# Patient Record
Sex: Female | Born: 1940
Health system: Southern US, Community
[De-identification: ages and names within clinical notes are randomized; demographics above are authoritative.]

## PROBLEM LIST (undated history)

## (undated) DIAGNOSIS — E119 Type 2 diabetes mellitus without complications: Secondary | ICD-10-CM

## (undated) DIAGNOSIS — I10 Essential (primary) hypertension: Secondary | ICD-10-CM

## (undated) DIAGNOSIS — Q250 Patent ductus arteriosus: Secondary | ICD-10-CM

## (undated) DIAGNOSIS — I318 Other specified diseases of pericardium: Secondary | ICD-10-CM

## (undated) DIAGNOSIS — M1990 Unspecified osteoarthritis, unspecified site: Secondary | ICD-10-CM

## (undated) DIAGNOSIS — M549 Dorsalgia, unspecified: Secondary | ICD-10-CM

## (undated) DIAGNOSIS — J449 Chronic obstructive pulmonary disease, unspecified: Secondary | ICD-10-CM

## (undated) DIAGNOSIS — Z8489 Family history of other specified conditions: Secondary | ICD-10-CM

## (undated) DIAGNOSIS — Z794 Long term (current) use of insulin: Secondary | ICD-10-CM

## (undated) DIAGNOSIS — I2510 Atherosclerotic heart disease of native coronary artery without angina pectoris: Secondary | ICD-10-CM

## (undated) DIAGNOSIS — E079 Disorder of thyroid, unspecified: Secondary | ICD-10-CM

## (undated) HISTORY — DX: Patent ductus arteriosus: Q25.0

## (undated) HISTORY — DX: Type 2 diabetes mellitus without complications: E11.9

## (undated) HISTORY — PX: INCISIONAL HERNIA REPAIR: SHX193

## (undated) HISTORY — DX: Dorsalgia, unspecified: M54.9

## (undated) HISTORY — PX: ABDOMINAL HYSTERECTOMY: SHX81

## (undated) HISTORY — PX: TUBAL LIGATION: SHX77

## (undated) HISTORY — DX: Long term (current) use of insulin: Z79.4

## (undated) HISTORY — DX: Unspecified osteoarthritis, unspecified site: M19.90

## (undated) HISTORY — DX: Other specified diseases of pericardium: I31.8

## (undated) HISTORY — DX: Atherosclerotic heart disease of native coronary artery without angina pectoris: I25.10

## (undated) HISTORY — DX: Disorder of thyroid, unspecified: E07.9

## (undated) HISTORY — DX: Essential (primary) hypertension: I10

---

## 2000-05-12 ENCOUNTER — Other Ambulatory Visit: Admission: RE | Admit: 2000-05-12 | Discharge: 2000-05-12 | Payer: Self-pay | Admitting: *Deleted

## 2002-02-08 ENCOUNTER — Other Ambulatory Visit: Admission: RE | Admit: 2002-02-08 | Discharge: 2002-02-08 | Payer: Self-pay | Admitting: Family Medicine

## 2003-08-15 ENCOUNTER — Ambulatory Visit (HOSPITAL_COMMUNITY): Admission: RE | Admit: 2003-08-15 | Discharge: 2003-08-15 | Payer: Self-pay | Admitting: Neurology

## 2003-12-12 ENCOUNTER — Other Ambulatory Visit: Admission: RE | Admit: 2003-12-12 | Discharge: 2003-12-12 | Payer: Self-pay | Admitting: Family Medicine

## 2004-07-22 ENCOUNTER — Ambulatory Visit (HOSPITAL_COMMUNITY): Admission: RE | Admit: 2004-07-22 | Discharge: 2004-07-22 | Payer: Self-pay | Admitting: Ophthalmology

## 2007-08-02 ENCOUNTER — Ambulatory Visit: Payer: Self-pay | Admitting: Internal Medicine

## 2007-08-26 ENCOUNTER — Ambulatory Visit: Payer: Self-pay | Admitting: Internal Medicine

## 2007-09-08 ENCOUNTER — Ambulatory Visit: Payer: Self-pay | Admitting: Cardiology

## 2007-09-10 ENCOUNTER — Ambulatory Visit: Payer: Self-pay | Admitting: Internal Medicine

## 2007-09-10 ENCOUNTER — Ambulatory Visit: Payer: Self-pay | Admitting: Cardiology

## 2007-09-10 ENCOUNTER — Inpatient Hospital Stay (HOSPITAL_COMMUNITY): Admission: AD | Admit: 2007-09-10 | Discharge: 2007-09-21 | Payer: Self-pay | Admitting: Cardiology

## 2007-09-11 ENCOUNTER — Ambulatory Visit: Payer: Self-pay | Admitting: Infectious Diseases

## 2007-09-17 ENCOUNTER — Encounter (INDEPENDENT_AMBULATORY_CARE_PROVIDER_SITE_OTHER): Payer: Self-pay | Admitting: Interventional Radiology

## 2007-09-28 ENCOUNTER — Emergency Department (HOSPITAL_COMMUNITY): Admission: EM | Admit: 2007-09-28 | Discharge: 2007-09-28 | Payer: Self-pay | Admitting: Emergency Medicine

## 2007-10-14 ENCOUNTER — Ambulatory Visit: Payer: Self-pay | Admitting: Cardiology

## 2007-10-20 ENCOUNTER — Ambulatory Visit: Payer: Self-pay | Admitting: Cardiology

## 2007-12-21 ENCOUNTER — Encounter: Payer: Self-pay | Admitting: Cardiology

## 2007-12-21 ENCOUNTER — Ambulatory Visit: Payer: Self-pay | Admitting: Cardiology

## 2008-02-24 ENCOUNTER — Ambulatory Visit: Payer: Self-pay | Admitting: Cardiology

## 2008-03-08 ENCOUNTER — Encounter: Payer: Self-pay | Admitting: Cardiology

## 2008-03-20 ENCOUNTER — Encounter: Payer: Self-pay | Admitting: Cardiology

## 2008-10-09 ENCOUNTER — Ambulatory Visit: Payer: Self-pay | Admitting: Cardiology

## 2008-10-24 ENCOUNTER — Encounter: Payer: Self-pay | Admitting: Cardiology

## 2009-03-08 ENCOUNTER — Encounter: Payer: Self-pay | Admitting: Cardiology

## 2009-09-25 ENCOUNTER — Encounter: Payer: Self-pay | Admitting: Cardiology

## 2009-10-10 ENCOUNTER — Encounter: Payer: Self-pay | Admitting: Internal Medicine

## 2009-10-25 ENCOUNTER — Ambulatory Visit: Payer: Self-pay | Admitting: Cardiology

## 2009-10-25 DIAGNOSIS — E119 Type 2 diabetes mellitus without complications: Secondary | ICD-10-CM

## 2009-10-25 DIAGNOSIS — I319 Disease of pericardium, unspecified: Secondary | ICD-10-CM | POA: Insufficient documentation

## 2009-10-25 DIAGNOSIS — R0602 Shortness of breath: Secondary | ICD-10-CM | POA: Insufficient documentation

## 2009-12-27 ENCOUNTER — Encounter: Payer: Self-pay | Admitting: Internal Medicine

## 2010-01-01 ENCOUNTER — Encounter (INDEPENDENT_AMBULATORY_CARE_PROVIDER_SITE_OTHER): Payer: Self-pay | Admitting: *Deleted

## 2010-01-01 ENCOUNTER — Ambulatory Visit: Payer: Self-pay | Admitting: Internal Medicine

## 2010-01-01 DIAGNOSIS — R198 Other specified symptoms and signs involving the digestive system and abdomen: Secondary | ICD-10-CM

## 2010-01-01 DIAGNOSIS — K625 Hemorrhage of anus and rectum: Secondary | ICD-10-CM

## 2010-01-01 DIAGNOSIS — R1319 Other dysphagia: Secondary | ICD-10-CM | POA: Insufficient documentation

## 2010-01-08 ENCOUNTER — Ambulatory Visit (HOSPITAL_COMMUNITY)
Admission: RE | Admit: 2010-01-08 | Discharge: 2010-01-08 | Payer: Self-pay | Source: Home / Self Care | Admitting: Internal Medicine

## 2010-01-08 ENCOUNTER — Ambulatory Visit: Payer: Self-pay | Admitting: Internal Medicine

## 2010-03-18 ENCOUNTER — Encounter: Payer: Self-pay | Admitting: Physician Assistant

## 2010-03-18 ENCOUNTER — Ambulatory Visit: Payer: Self-pay | Admitting: Physician Assistant

## 2010-03-18 ENCOUNTER — Encounter: Payer: Self-pay | Admitting: Cardiology

## 2010-03-18 DIAGNOSIS — R079 Chest pain, unspecified: Secondary | ICD-10-CM

## 2010-03-18 DIAGNOSIS — R072 Precordial pain: Secondary | ICD-10-CM | POA: Insufficient documentation

## 2010-03-18 DIAGNOSIS — I10 Essential (primary) hypertension: Secondary | ICD-10-CM

## 2010-03-18 DIAGNOSIS — E782 Mixed hyperlipidemia: Secondary | ICD-10-CM

## 2010-03-19 ENCOUNTER — Encounter: Payer: Self-pay | Admitting: Cardiology

## 2010-03-19 ENCOUNTER — Ambulatory Visit: Payer: Self-pay | Admitting: Cardiology

## 2010-03-26 ENCOUNTER — Ambulatory Visit: Payer: Self-pay | Admitting: Cardiology

## 2010-03-26 DIAGNOSIS — Q250 Patent ductus arteriosus: Secondary | ICD-10-CM

## 2010-03-26 DIAGNOSIS — R9389 Abnormal findings on diagnostic imaging of other specified body structures: Secondary | ICD-10-CM

## 2010-04-17 ENCOUNTER — Encounter (INDEPENDENT_AMBULATORY_CARE_PROVIDER_SITE_OTHER): Payer: Self-pay | Admitting: *Deleted

## 2010-04-23 ENCOUNTER — Ambulatory Visit (HOSPITAL_COMMUNITY)
Admission: RE | Admit: 2010-04-23 | Discharge: 2010-04-23 | Payer: Self-pay | Source: Home / Self Care | Attending: Cardiology | Admitting: Cardiology

## 2010-05-14 ENCOUNTER — Ambulatory Visit
Admission: RE | Admit: 2010-05-14 | Discharge: 2010-05-14 | Payer: Self-pay | Source: Home / Self Care | Attending: Cardiology | Admitting: Cardiology

## 2010-05-14 ENCOUNTER — Encounter (INDEPENDENT_AMBULATORY_CARE_PROVIDER_SITE_OTHER): Payer: Self-pay | Admitting: *Deleted

## 2010-05-14 ENCOUNTER — Encounter: Payer: Self-pay | Admitting: Cardiology

## 2010-05-14 DIAGNOSIS — I2510 Atherosclerotic heart disease of native coronary artery without angina pectoris: Secondary | ICD-10-CM | POA: Insufficient documentation

## 2010-05-16 ENCOUNTER — Encounter: Payer: Self-pay | Admitting: Cardiology

## 2010-05-21 ENCOUNTER — Ambulatory Visit
Admission: RE | Admit: 2010-05-21 | Discharge: 2010-05-21 | Payer: Self-pay | Source: Home / Self Care | Attending: Cardiovascular Disease | Admitting: Cardiovascular Disease

## 2010-05-21 ENCOUNTER — Encounter: Payer: Self-pay | Admitting: Cardiovascular Disease

## 2010-05-21 ENCOUNTER — Encounter: Payer: Self-pay | Admitting: Cardiology

## 2010-05-22 LAB — POCT I-STAT 3, ART BLOOD GAS (G3+)
Acid-Base Excess: 2 mmol/L (ref 0.0–2.0)
Acid-Base Excess: 2 mmol/L (ref 0.0–2.0)
Bicarbonate: 27.5 mEq/L — ABNORMAL HIGH (ref 20.0–24.0)
Bicarbonate: 27.8 mEq/L — ABNORMAL HIGH (ref 20.0–24.0)
O2 Saturation: 86 %
O2 Saturation: 90 %
TCO2: 29 mmol/L (ref 0–100)
TCO2: 29 mmol/L (ref 0–100)
pCO2 arterial: 45.8 mmHg — ABNORMAL HIGH (ref 35.0–45.0)
pCO2 arterial: 45.8 mmHg — ABNORMAL HIGH (ref 35.0–45.0)
pH, Arterial: 7.387 (ref 7.350–7.400)
pH, Arterial: 7.391 (ref 7.350–7.400)
pO2, Arterial: 52 mmHg — ABNORMAL LOW (ref 80.0–100.0)
pO2, Arterial: 60 mmHg — ABNORMAL LOW (ref 80.0–100.0)

## 2010-05-22 LAB — POCT I-STAT 3, VENOUS BLOOD GAS (G3P V)
Acid-Base Excess: 3 mmol/L — ABNORMAL HIGH (ref 0.0–2.0)
Bicarbonate: 28.9 mEq/L — ABNORMAL HIGH (ref 20.0–24.0)
O2 Saturation: 69 %
TCO2: 30 mmol/L (ref 0–100)
pCO2, Ven: 47.1 mmHg (ref 45.0–50.0)
pH, Ven: 7.396 — ABNORMAL HIGH (ref 7.250–7.300)
pO2, Ven: 37 mmHg (ref 30.0–45.0)

## 2010-05-22 LAB — GLUCOSE, POCT (MANUAL RESULT ENTRY)
Glucose, Bld: 206 mg/dL — ABNORMAL HIGH (ref 70–99)
Operator id: 194801

## 2010-05-23 ENCOUNTER — Telehealth (INDEPENDENT_AMBULATORY_CARE_PROVIDER_SITE_OTHER): Payer: Self-pay | Admitting: *Deleted

## 2010-05-29 ENCOUNTER — Ambulatory Visit
Admission: RE | Admit: 2010-05-29 | Discharge: 2010-05-29 | Payer: Self-pay | Source: Home / Self Care | Attending: Cardiology | Admitting: Cardiology

## 2010-06-04 NOTE — Assessment & Plan Note (Signed)
Summary: Ellen Woods   Visit Type:  Follow-up Primary Provider:  Paulita Cradle, NP  CC:  rapid heart rate .  History of Present Illness: Primary Cardiologist:  Dr. Lewayne Bunting   Ellen Woods is a 70 year old patient who is followed in our [place] office.  She was seen at [facility] today with complaints of chest discomfort and added onto my schedule here in [place].  Her history includes a moderate pericardial effusion in 2009.  She ruled out for constrictive physiology.  She had a pleural effusion that was tapped.  She was treated for pneumonia.  She was ruled out for tuberculosis.  In followup, her effusion resolved.  She last saw Dr. Andee Lineman in June 2011.  In August of this year, she underwent upper endoscopy with esophageal dilatation secondary to dysphagia.  She describes dyspnea with exertion over the last 2-3 months.  She notes this with just minimal activity.  For example, she developed significant shortness of breath with trying to rake leaves this past weekend. She really describes New York Heart Association class IIb-III symptoms.  She denies orthopnea or PND.  She denies edema.  Two  nights ago, she developed substernal chest pressure or squeezing.  This was at rest.  It lasted several hours.  She thinks she felt it up in her neck.  She denies shortness of breath associated with this.  Yesterday, she was in an argument with her daughter and developed tachycardia palpitations.  Her symptoms lasted 3-4 hours.  She denies any associated chest pain with this.  She denies exertional chest pain.  Her electrocardiogram today at her primary care provider's office demonstrated a heart rate of 67, normal sinus rhythm, normal axis and T wave inversions in 2, 3 and aVF.  Current Medications (verified): 1)  Miralax  Powd (Polyethylene Glycol 3350) .... Use As Directed As Needed 2)  Potassium Chloride Crys Cr 20 Meq Cr-Tabs (Potassium Chloride Crys Cr) .... Take One Tablet By  Mouth Daily 3)  Pre-Natal Formula  Tabs (Prenatal Multivit-Min-Fe-Fa) .... Take 1 Tablet By Mouth Once A Day 4)  Furosemide 20 Mg Tabs (Furosemide) .... Take 1 Tablet By Mouth Once A Day 5)  Metoprolol Tartrate 25 Mg Tabs (Metoprolol Tartrate) .... Take 1 Tablet By Mouth Two Times A Day 6)  Lantus 100 Unit/ml Soln (Insulin Glargine) .... Use As Directed 7)  Caltrate 600+d 600-400 Mg-Unit Tabs (Calcium Carbonate-Vitamin D) .... Take 1 Tablet By Mouth Once A Day 8)  Alprazolam 0.5 Mg Tabs (Alprazolam) .... Take Three Times Daily 9)  Omeprazole 40 Mg Cpdr (Omeprazole) .... Take 1 Tablet By Mouth Once A Day 10)  Novolog Flexpen 100 Unit/ml Soln (Insulin Aspart) .... Use As Directed 11)  Levothyroxine Sodium 50 Mcg Tabs (Levothyroxine Sodium) .... Take 1 Tablet By Mouth Once A Day 12)  Paroxetine Hcl 20 Mg Tabs (Paroxetine Hcl) .... As Needed 13)  Proctozone-Hc 2.5 % Crea (Hydrocortisone) .... Apply To Anal and Rectal Area Two Times A Day and As Needed 14)  Lisinopril 5 Mg Tabs (Lisinopril) .... Take One Daily  Allergies: 1)  ! * Azithromycin 2)  Codeine  Past History:  Past Medical History: History of exertional dyspnea with moderate pericardial effusion with positive clue Kussmaul but ruled out for constrictive pericarditis by cardiac catheterization; possible pneumonia ruled out for tuberculosis at [facility] (2009) CAD   a.  Cath 09/2007:  60% prox. LAD; 50% mid LAD; 40% prox. CFX h/o Right carotid bruit granulomatous calcification  of the liver and spleen significant calcification of the pericardium. prolonged elevated sedimentation rate now normalized Anemia Diabetes mellitus Hypertension Hyperlipidemia Hypothyroidism Depression GERD   a.  s/p EGD with esophageal dilation in 12/2009  Past Surgical History: Reviewed history from 12/31/2009 and no changes required. incisional hernia repair hysterectomy Tubal Ligation  Family History: Father died from complications of  a motor vehicle accident and her mother died from a stroke. Sis:  CAD; s/p CABG in 63s with subsequent MI  Social History: Reviewed history from 02/27/2008 and no changes required. the patient is widowed and has two grown daughters.  She remains active physically.  She does not use alcohol, tobacco or street drugs.  There is no prior history of TB exposure  Review of Systems       The patient denies fevers, chills, cough, melena, hematochezia, dysuria, hematuria, rashes, arthralgias, myalgias, heat or cold intolerance.  Please see the HPI.  All other systems reviewed and negative.   Vital Signs:  Patient profile:   70 year old female Height:      59 inches Weight:      122 pounds BMI:     24.73 Pulse rate:   68 / minute Pulse rhythm:   regular BP sitting:   98 / 62  (right arm)  Vitals Entered By: Jacquelin Hawking, CMA (March 18, 2010 3:15 PM)  Physical Exam  General:  Well nourished, well developed in no acute distress HEENT: Normal Neck: No JVD Cardiac:  Normal S1, S2; RRR; no murmur Lungs:  Clear to auscultation bilaterally, no wheezing, rhonchi or rales Abd: Soft, nontender, no hepatomegaly Ext: No edema Vascular: No carotid  bruits Skin: Warm and dry MSK:  No deformities Lymph: No adenopathy Endocrine:  No thyromegaly Neuro: CNs 2-12 intact; nonfocal    EKG  Procedure date:  03/18/2010  Findings:      NSR HR 63 normal axis TW inversions 2, 3, aVF LVH   Impression & Recommendations:  Problem # 1:  CHEST PAIN UNSPECIFIED (ICD-786.50)  She has more typical features than atypical. She did have a recent EGD with dilation done a couple mos. ago. She has TW inversions inferiorly, but has had these on prior ECGs as well. Discussed with Dr. Ladona Ridgel. I will set her up with a stress myoview in [place] to r/o ischemia as she has a h/o moderate nonobstructive CAD on cath as well as significant risk factors of HTN, HLP, DM2. She will be brought back in close follow up  in [place] with her primary cardiologist, Dr. Andee Lineman, in one week.  Problem # 2:  DYSPNEA (ICD-786.05)  As above, she will have a myoview done. I will also get a 2D echo done tomorrow in [place] to r/o recurrent pericardial effusion with her past medical hx.  Problem # 3:  ESSENTIAL HYPERTENSION, BENIGN (ICD-401.1)  Controlled.  Problem # 4:  HYPERLIPIDEMIA (ICD-272.4)  Followed by PCP.  Problem # 5:  DM (ICD-250.00)  Problem # 6:  DYSPHAGIA (ICD-787.29)  s/p recent EGD with dilatation.   Patient Instructions: 1)  Your physician recommends that you schedule a follow-up appointment in: Dr. Andee Lineman. 2)  Your physician has requested that you have an echocardiogram.  Echocardiography is a painless test that uses sound waves to create images of your heart. It provides your doctor with information about the size and shape of your heart and how well your heart's chambers and valves are working.  This procedure takes approximately one hour. There are no restrictions  for this procedure. Test to be done in [place] 3)  Your physician has requested that you have an exercise stress myoview.  For further information please visit [email or web address]  Please follow instruction sheet, as given. to be done in [place] 4)  Your physician recommends that you continue on your current medications as directed. Please refer to the Current Medication list given to you today. Make sure to take an Aspirin daily as prescrived. New medication. Nitroglycerin 0.4 mg. Take one tablet SL for chest pain. May repeat every 5 minutes for a total of 3 doses. Prescriptions: NITROSTAT 0.4 MG SUBL (NITROGLYCERIN) 1 tablet under tongue at onset of chest pain; you may repeat every 5 minutes for up to 3 doses.  #25 x 3   Entered by:   Ollen Gross, RN, BSN   Authorized by:   Tereso Newcomer PA-C   Signed by:   Ollen Gross, RN, BSN on 03/18/2010   Method used:   Electronically to        [facility] (retail)       [address]        [place], [place]  [address]       Ph: [phone]       Fax: [phone]   RxID:   [id number]  I have personally reviewed the prescriptions today for accuracy. Tereso Newcomer PA-C  March 18, 2010 4:42 PM

## 2010-06-04 NOTE — Assessment & Plan Note (Signed)
Summary: GERD/YF   History of Present Illness Visit Type: Follow-up Visit Primary GI MD: Stan Head MD [facility] Primary Provider: Elon Alas Requesting Provider: Rudi Heap, MD Chief Complaint: Dysphagia x 3months History of Present Illness:   70 yo ww with 3 months + of dysphagia. Probably at least 6 months. Has not had this problem previously. She used to have heartburn years ago but not for a number of years. She drinks water to make the food pass. Solid not liquids. Recent LLQ pain that resolved. She is not really using her ompeprazole for the past 2 months or so because she was not having heartburn.  She has seen bright red blood streaked on mucus x 1 week. May have had empiric antibiotics in past few months ? diverticulitis. Has been using fish oil fo past month as well as metamucil to help defecation as has generally been squirrelly.    GI Review of Systems    Reports abdominal pain, dysphagia with solids, and  vomiting.      Denies acid reflux, belching, bloating, chest pain, dysphagia with liquids, heartburn, loss of appetite, nausea, vomiting blood, weight loss, and  weight gain.      Reports diarrhea and  rectal bleeding.     Denies anal fissure, black tarry stools, change in bowel habit, constipation, diverticulosis, fecal incontinence, heme positive stool, hemorrhoids, irritable bowel syndrome, jaundice, light color stool, liver problems, and  rectal pain.    Clinical Reports Reviewed:  Colonoscopy:  08/26/2007:  Results: Hemorrhoids. Location:  [facility].   Comments: 1) SMALL EXTERNAL HEMORRHOIDS 2) OTHERWISE NORMAL EXAM WITH GOOD PREP   Current Medications (verified): 1)  Miralax  Powd (Polyethylene Glycol 3350) .... Use As Directed As Needed 2)  Potassium Chloride Crys Cr 20 Meq Cr-Tabs (Potassium Chloride Crys Cr) .... Take One Tablet By Mouth Daily 3)  Aspirin 81 Mg Tbec (Aspirin) .... Take 1 Tablet By Mouth Once A Day 4)  Pre-Natal  Formula  Tabs (Prenatal Multivit-Min-Fe-Fa) .... Take 1 Tablet By Mouth Once A Day 5)  Furosemide 20 Mg Tabs (Furosemide) .... Take 1 Tablet By Mouth Once A Day 6)  Metoprolol Tartrate 25 Mg Tabs (Metoprolol Tartrate) .... Take 1 Tablet By Mouth Two Times A Day 7)  Lantus 100 Unit/ml Soln (Insulin Glargine) .... Use As Directed 8)  Caltrate 600+d 600-400 Mg-Unit Tabs (Calcium Carbonate-Vitamin D) .... Take 1 Tablet By Mouth Once A Day 9)  Simvastatin 40 Mg Tabs (Simvastatin) .... Take 1 Tablet By Mouth Once A Day 10)  Alprazolam 0.5 Mg Tabs (Alprazolam) .... Take 1 Tablet By Mouth Once A Day 11)  Omeprazole 40 Mg Cpdr (Omeprazole) .... Take 1 Tablet By Mouth Once A Day 12)  Novolog Flexpen 100 Unit/ml Soln (Insulin Aspart) .... Use As Directed 13)  Levothyroxine Sodium 50 Mcg Tabs (Levothyroxine Sodium) .... Take 1 Tablet By Mouth Once A Day 14)  Fish Oil 1200 Mg Caps (Omega-3 Fatty Acids) .... Take 1 Tablet By Mouth Once A Day 15)  Paroxetine Hcl 20 Mg Tabs (Paroxetine Hcl) .... As Needed  Allergies (verified): 1)  ! * Azithromycin 2)  Codeine  Past History:  Past Medical History: Reviewed history from 12/31/2009 and no changes required. history of exertional dyspnea with moderate pericardial effusion.   status post positive clue Kussmaul but ruled out for constrictive pericarditis by cardiac catheterization status-post fevers possible pneumonia ruled out for tuberculosis at [facility] right carotid bruit, no current workup yet  granulomatous calcification of  the liver and spleen significant calcification of the pericardium. prolonged elevated sedimentation rate now normalized Anemia Diabetes mellitus  Past Surgical History: Reviewed history from 12/31/2009 and no changes required. incisional hernia repair hysterectomy Tubal Ligation  Family History: Reviewed history from 02/27/2008 and no changes required. negative for premature coronary artery disease and cancer.   Father died from complications of a motor vehicle accident and her mother died from a stroke.  Social History: Reviewed history from 02/27/2008 and no changes required. the patient is widowed and has two grown daughters.  She remains active physically.  She does not use alcohol, tobacco or street drugs.  There is no prior history of TB exposure  Review of Systems       has gained a few #. blood sugar 65 last week with nausea and vomiting and inconinence of stool  All other ROS negative except as per HPI.   Vital Signs:  Patient profile:   70 year old female Height:      59 inches Weight:      122.50 pounds BMI:     24.83 Pulse rate:   68 / minute Pulse rhythm:   regular BP sitting:   142 / 70  (left arm) Cuff size:   regular  Vitals Entered By: June McMurray CMA Duncan Dull) (January 01, 2010 1:23 PM)  Physical Exam  General:  Well developed, well nourished, no acute distress. Eyes:  PERRLA, no icterus. Mouth:  No deformity or lesions, +dentures Neck:  Supple; no masses or thyromegaly. Lungs:  Clear throughout to auscultation. Heart:  Regular rate and rhythm; no murmurs, rubs,  or bruits. Abdomen:  Soft, nontender and nondistended. No masses, hepatosplenomegaly or hernias noted. Normal bowel sounds. Rectal:  Female staff present: external hemorrhoids, brown stool, no mass, mildly tenser with normal tone and slight anal stenosis Extremities:  No clubbing, cyanosis, edema or deformities noted. Cervical Nodes:  No significant cervical or supraclavicular adenopathy.  Psych:  Alert and cooperative. Normal mood and affect.   Impression & Recommendations:  Problem # 1:  DYSPHAGIA (ZOX-096.04) Assessment New  Instermittent and recurrent. seems like a ring or GERD stricture. Cancer is possible. EGD ad dilation. Restart omeprazole 40 mg once daily. Risks, benefits,and indications of endoscopic procedure(s) were reviewed with the patient and all questions answered.  Orders: ZEGD  Balloon Dil (ZEGD Balloon)  Problem # 2:  RECTAL BLEEDING (ICD-569.3) Seems hemorrhoidsl. try topical tx.  Problem # 3:  CHANGE IN BOWELS (VWU-981.19) Assessment: New I suspect fish oil may be culprit. No frank diarrhea. will see what happens when she holds fish oil.  Patient Instructions: 1)  Please pick up your medications at your pharmacy.  2)  We will see you at your procedure on 01/08/10. 3)  Hold your Lantus the morning of your procedure.  See seperate instructions. 4)  Restart your Omeprazole. 5)  Stop your Fish Oil. 6)  Upper Endoscopy with Dilatation brochure given. 7)  Copy Sent To: Belva Agee, NP 8)  The medication list was reviewed and reconciled.  All changed / newly prescribed medications were explained.  A complete medication list was provided to the patient / caregiver.  Prescriptions: PROCTOZONE-HC 2.5 % CREA (HYDROCORTISONE) apply to anal and rectal area two times a day and as needed  #30 grams x 0   Entered and Authorized by:   Iva Boop MD, [facility]   Signed by:   Iva Boop MD, [facility] on 01/01/2010   Method used:   Electronically to  [facility] [address] [facility] (retail)       [address]       [place], [place]  [address]       Ph: [phone]       Fax: [phone]   RxID:   [id number]   Appended Document: GERD/YF CBC ok on 6/8 11 except wbc 3.6

## 2010-06-04 NOTE — Letter (Signed)
Summary: [facility]  [facility]   Imported By: Lennie Odor 01/09/2010 16:06:21  _____________________________________________________________________  External Attachment:    Type:   Image     Comment:   External Document

## 2010-06-04 NOTE — Procedures (Signed)
Summary: Colonoscopy: Hemorrhoids   Colonoscopy  Procedure date:  08/26/2007  Findings:      Results: Hemorrhoids. Location:  [facility].   Comments: 1) SMALL EXTERNAL HEMORRHOIDS 2) OTHERWISE NORMAL EXAM WITH GOOD PREP  Comments:      Repeat colonoscopy in 10 years.    Procedures Next Due Date:    Colonoscopy: 09/2017  Patient Name: Ellen Woods, Ellen Woods. MRN: [id number] Procedure Procedures: Colorectal cancer screening, average risk CPT: G0121.  Personnel: Endoscopist: Iva Boop, MD, [facility].  Referred By: Paulita Cradle, NP.  Exam Location: Exam performed in Outpatient Clinic. Outpatient  Patient Consent: Procedure, Alternatives, Risks and Benefits discussed, consent obtained, from patient. Consent was obtained by the RN.  Indications  Average Risk Screening Routine.  History  Current Medications: Patient is not currently taking Coumadin.  Pre-Exam Physical: Performed Aug 26, 2007. Cardio-pulmonary exam, Rectal exam, HEENT exam , Abdominal exam, Mental status exam WNL.  Comments: Pt. history reviewed/updated, physical exam performed prior to initiation of sedation? YES Exam Exam: Extent of exam reached: Cecum, extent intended: Cecum.  The cecum was identified by appendiceal orifice and IC valve. Patient position: on left side. Time to Cecum: 00:04. Time for Withdrawl: 00:10:18. Colon retroflexion performed. Images taken. ASA Classification: II. Tolerance: excellent.  Monitoring: Pulse and BP monitoring, Oximetry used. Supplemental O2 given.  Colon Prep Used MiraLax for colon prep. Prep results: good.  Sedation Meds: Patient assessed and found to be appropriate for moderate (conscious) sedation. Fentanyl 75 mcg. given IV. Versed 7 mg. given IV.  Findings - NORMAL EXAM: Cecum to Sigmoid Colon.  HEMORRHOIDS: External. Size: Grade I.   Assessment  Comments: 1) SMALL EXTERNAL HEMORRHOIDS 2) OTHERWISE NORMAL EXAM WITH GOOD  PREP Events  Unplanned Interventions: No intervention was required.  Plans Disposition: After procedure patient sent to recovery. After recovery patient sent home.  Scheduling/Referral: Colonoscopy, 10 YRS FOR ROUTINE SCREEN,   CC:   Paulita Cradle, MD  This report was created from the original endoscopy report, which was reviewed and signed by the above listed endoscopist.

## 2010-06-04 NOTE — Assessment & Plan Note (Signed)
Summary: 1 wk fu per Tereso Newcomer   Visit Type:  Follow-up Primary Ellen Woods:  Ellen Woods   History of Present Illness: the patient was seen recently by Tereso Newcomer because of increased dyspnea. She was set up for an echocardiogram was found to have normal function. There was evidence of possible patent ductus arteriosus or other bone or shunt without clear origin. Of note is that the patient had a prior cardiac catheterization with nonobstructive coronary disease and actually had a left and right heart catheterization to rule out constrictive pericarditis. On chest x-ray she has known calcification of her pericardium. She status post prior thoracentesis with no recurrence. The patient was scheduled for a Myoview by Tereso Newcomer but the patient canceled this and was wondering if she needed this.  She continues to be short of breath. This occurs on minimal activity. She stated she was raking leaves this weekend and could only do it for over 5 minutes at a time. She has felt her exercise tolerance is markedly decreased. She reports however no chest pain or chest tightness. Sometimes she does experience a pounding sensation of note is that the patient was given reportedly recent recommendation to start up her oxygen which he has not done yet. She reports increased palpitations. She does appear anxious.  Interestingly as documented in my prior notes and again during this physical examination there is no evidence of a continuous murmur or any evidence of physical examination of a patent ductus arteriosus.  Preventive Screening-Counseling & Management  Alcohol-Tobacco     Smoking Status: never  Current Medications (verified): 1)  Miralax  Powd (Polyethylene Glycol 3350) .... Use As Directed As Needed 2)  Potassium Chloride Crys Cr 20 Meq Cr-Tabs (Potassium Chloride Crys Cr) .... Take One Tablet By Mouth Daily 3)  Pre-Natal Formula  Tabs (Prenatal Multivit-Min-Fe-Fa) .... Take 1 Tablet By Mouth  Once A Day 4)  Furosemide 20 Mg Tabs (Furosemide) .... Take 1 Tablet By Mouth Once A Day 5)  Metoprolol Tartrate 25 Mg Tabs (Metoprolol Tartrate) .... Take 1 11/2 Tabs (37.5mg ) Two Times A Day 6)  Lantus 100 Unit/ml Soln (Insulin Glargine) .... Use As Directed 7)  Caltrate 600+d 600-400 Mg-Unit Tabs (Calcium Carbonate-Vitamin D) .... Take 1 Tablet By Mouth Once A Day 8)  Alprazolam 0.5 Mg Tabs (Alprazolam) .... Take 1 Tablet By Mouth Two Times A Day 9)  Omeprazole 40 Mg Cpdr (Omeprazole) .... Take 1 Tablet By Mouth Once A Day 10)  Novolog Flexpen 100 Unit/ml Soln (Insulin Aspart) .... Use As Directed 11)  Levothyroxine Sodium 50 Mcg Tabs (Levothyroxine Sodium) .... Take 1 Tablet By Mouth Once A Day 12)  Paroxetine Hcl 20 Mg Tabs (Paroxetine Hcl) .... As Needed 13)  Proctozone-Hc 2.5 % Crea (Hydrocortisone) .... Apply To Anal and Rectal Area Two Times A Day and As Needed 14)  Lisinopril 5 Mg Tabs (Lisinopril) .... Take One Daily 15)  Nitrostat 0.4 Mg Subl (Nitroglycerin) .Marland Kitchen.. 1 Tablet Under Tongue At Onset of Chest Pain; You May Repeat Every 5 Minutes For Up To 3 Doses. 16)  Aspirin 81 Mg Tbec (Aspirin) .... Take One Tablet By Mouth Daily 17)  Simvastatin 40 Mg Tabs (Simvastatin) .... Take 1 Tablet By Mouth Once A Day 18)  Ferrous Sulfate 325 (65 Fe) Mg Tabs (Ferrous Sulfate) .... Take 1 Tablet By Mouth Once A Day As Needed  Allergies (verified): 1)  ! * Azithromycin 2)  Codeine  Comments:  Nurse/Medical Assistant: The patient's medication bottles and  allergies were reviewed with the patient and were updated in the Medication and Allergy Lists.  Past History:  Past Medical History: Last updated: 2010-03-27 History of exertional dyspnea with moderate pericardial effusion with positive clue Kussmaul but ruled out for constrictive pericarditis by cardiac catheterization; possible pneumonia ruled out for tuberculosis at [facility] (2009) CAD   a.  Cath 09/2007:  60% prox. LAD; 50%  mid LAD; 40% prox. 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 prolonged elevated sedimentation rate now normalized Anemia Diabetes mellitus Hypertension Hyperlipidemia Hypothyroidism Depression GERD   a.  s/p EGD with esophageal dilation in 12/2009  Past Surgical History: Last updated: 12/31/2009 incisional hernia repair hysterectomy Tubal Ligation  Family History: Last updated: 03/27/2010 Father died from complications of a motor vehicle accident and her mother died from a stroke. Sis:  CAD; s/p CABG in 68s with subsequent MI  Social History: Last updated: 02/27/2008 the patient is widowed and has two grown daughters.  She remains active physically.  She does not use alcohol, tobacco or street drugs.  There is no prior history of TB exposure  Risk Factors: Smoking Status: never (03/26/2010)  Review of Systems       The patient complains of shortness of breath.  The patient denies fatigue, malaise, fever, weight gain/loss, vision loss, decreased hearing, hoarseness, chest pain, palpitations, prolonged cough, wheezing, sleep apnea, coughing up blood, abdominal pain, blood in stool, nausea, vomiting, diarrhea, heartburn, incontinence, blood in urine, muscle weakness, joint pain, leg swelling, rash, skin lesions, headache, fainting, dizziness, depression, anxiety, enlarged lymph nodes, easy bruising or bleeding, and environmental allergies.    Vital Signs:  Patient profile:   70 year old female Height:      59 inches Weight:      121 pounds Pulse rate:   66 / minute BP sitting:   97 / 60  (left arm) Cuff size:   regular  Vitals Entered By: Carlye Grippe (March 26, 2010 8:59 AM)  Physical Exam  Additional Exam:  General: Well-developed, well-nourished in no distress head: Normocephalic and atraumatic eyes PERRLA/EOMI intact, conjunctiva and lids normal nose: No deformity or  lesions mouth normal dentition, normal posterior pharynx neck: Supple, no JVD.  No masses, thyromegaly or abnormal cervical nodes, soft right carotid bruit lungs: Normal breath sounds bilaterally without wheezing.  Normal percussion heart: regular rate and rhythm with normal S1 and S2, no S3 or S4.  PMI is normal.  No pathological murmurs abdomen: Normal bowel sounds, abdomen is soft and nontender without masses, organomegaly or hernias noted.  No hepatosplenomegaly musculoskeletal: Back normal, normal gait muscle strength and tone normal pulsus: Pulse is normal in all 4 extremities Extremities: No peripheral pitting edema neurologic: Alert and oriented x 3 skin: Intact without lesions or rashes cervical nodes: No significant adenopathy psychologic: Normal affect    Impression & Recommendations:  Problem # 1:  PATENT DUCTUS ARTERIOSUS (ICD-747.0) abnormal echocardiogram with concern for patent ductus arteriosus or otheraorto- pulmonary shunt.the patient will be referred for a cardiac CTA, at the same time her coronary arteries can be reassessed as well as her lung fields because the patient has a history of interstitial lung disease all which could be contributing to her dyspnea. Orders: CT Scan (CT Scan) T-Basic Metabolic Panel ([id number])  Problem # 2:  ESSENTIAL HYPERTENSION, BENIGN (ICD-401.1) the patient blood pressure isrelatively low.I didn't increase her metoprolol to one and a half tablets twice a day  and discontinued lisinopril to make sure that she does not become hypotensive. She may need an extra dose of metoprolol prior to her cardiac CTA. Marland Kitchen Her updated medication list for this problem includes:    Furosemide 20 Mg Tabs (Furosemide) .Marland Kitchen... Take 1 tablet by mouth once a day    Metoprolol Tartrate 25 Mg Tabs (Metoprolol tartrate) .Marland Kitchen... Take 1 11/2 tabs (37.5mg ) two times a day    Lisinopril 5 Mg Tabs (Lisinopril) .Marland Kitchen... Take one daily    Aspirin 81 Mg Tbec (Aspirin) .Marland Kitchen...  Take one tablet by mouth daily  Orders: T-Basic Metabolic Panel [id number])  Problem # 3:  CHEST PAIN, PRECORDIAL (ICD-786.51) chest pain is atypical.patient is a prior catheterization 2009 60% proximal LAD lesion, 50% mid LAD lesion and 40% proximal circumflex lesion. Her updated medication list for this problem includes:    Metoprolol Tartrate 25 Mg Tabs (Metoprolol tartrate) .Marland Kitchen... Take 1 11/2 tabs (37.5mg ) two times a day    Lisinopril 5 Mg Tabs (Lisinopril) .Marland Kitchen... Take one daily    Nitrostat 0.4 Mg Subl (Nitroglycerin) .Marland Kitchen... 1 tablet under tongue at onset of chest pain; you may repeat every 5 minutes for up to 3 doses.    Aspirin 81 Mg Tbec (Aspirin) .Marland Kitchen... Take one tablet by mouth daily  Patient Instructions: 1)  Cardiac CTA  2)  Increase Metoprolol to 37.5mg  two times a day 3)  Follow up in  6 weeks Prescriptions: METOPROLOL TARTRATE 25 MG TABS (METOPROLOL TARTRATE) take 1 11/2 tabs (37.5mg ) two times a day  #90 x 6   Entered by:   Hoover Brunette, LPN   Authorized by:   Lewayne Bunting, MD, [facility]   Signed by:   Hoover Brunette, LPN on 14/78/2956   Method used:   Electronically to        [facility]  [address] (retail)       [address]       [place], [place]  [address]       Ph: [phone]       Fax: [phone]   RxID:   [id number]

## 2010-06-04 NOTE — Procedures (Signed)
Summary: Instruction for procedure/[place]  Instruction for procedure/[place]   Imported By: Sherian Rein 01/04/2010 15:13:30  _____________________________________________________________________  External Attachment:    Type:   Image     Comment:   External Document

## 2010-06-04 NOTE — Letter (Signed)
Summary: Diabetic Instructions  [facility]  [address] [place], [place] [address]   Phone: [phone]  Fax: [phone]    [facility] Miyasato [birth year] MRN: [id number]    _X_   INSULIN (LONG ACTING) MEDICATION INSTRUCTIONS (Lantus, NPH, 70/30, Humulin, Novolin-N)   The day before your procedure:   Take  your regular evening dose    The day of your procedure:   Do not take your morning dose

## 2010-06-04 NOTE — Procedures (Signed)
Summary: Upper Endoscopy w/DIL  Patient: Ellen Woods Note: All result statuses are Final unless otherwise noted.  Tests: (1) Upper Endoscopy w/DIL (UED)  UED Upper Endoscopy w/DIL                             DONE     [facility]     [address] [place], [place]  [address]           ENDOSCOPY PROCEDURE REPORT           PATIENT:  Ellen, Woods  MR#:  [id number]     BIRTHDATE:  1941-03-14, 68 yrs. old  GENDER:  female           ENDOSCOPIST:  Iva Boop, MD, [facility]           PROCEDURE DATE:  01/08/2010     PROCEDURE:  EGD, diagnostic, Maloney Dilation of the Esophagus     ASA CLASS:  Class II     INDICATIONS:  1) dysphagia           MEDICATIONS:   Fentanyl 40 mcg IV, Versed 4 mg     TOPICAL ANESTHETIC:  Cetacaine Spray           DESCRIPTION OF PROCEDURE:   After the risks benefits and     alternatives of the procedure were thoroughly explained, informed     consent was obtained.  The  endoscope was introduced through the     mouth and advanced to the second portion of the duodenum, without     limitations.  The instrument was slowly withdrawn as the mucosa     was carefully examined.     <<PROCEDUREIMAGES>>           The upper, middle, and distal third of the esophagus were     carefully inspected and no abnormalities were noted. The z-line     was well seen at the GEJ. The endoscope was pushed into the fundus     which was normal including a retroflexed view. The antrum, first     and second part of the duodenum were unremarkable. Z-line at 35     cm.    Dilation was then performed at the total esophagus           1) Dilator:  Elease Hashimoto  Size(s):  54 French     Resistance:  minimal  Heme:  None           COMPLICATIONS:  None           ENDOSCOPIC IMPRESSION:     1) Normal EGD     2) 54 French Maloney Dilation performed     for dysphagia     RECOMMENDATIONS:     Clear liquids until 1030 AM then soft foods today. Normal foods     for her starting  tomorrow.     Stay on omeprazole or other PPI chronically as dysphagia became     a problem when she stopped this, suggesting GERD is playing a role     in dysphagia.           See Dr. Brennan Bailey) as needed at this point.           REPEAT EXAM:  In for as needed.           Iva Boop, MD, [facility]  CC:  Belva Agee, NP     The Patient           n.     eSIGNED:   Iva Boop at 01/08/2010 09:36 AM           Miguel Aschoff, [id number]  Note: An exclamation mark (!) indicates a result that was not dispersed into the flowsheet. Document Creation Date: 01/08/2010 9:37 AM _______________________________________________________________________  (1) Order result status: Final Collection or observation date-time: 01/08/2010 09:27 Requested date-time:  Receipt date-time:  Reported date-time:  Referring Physician:   Ordering Physician: Stan Head [phone]) Specimen Source:  Source: Launa Grill Order Number: [id number] Lab site:

## 2010-06-04 NOTE — Assessment & Plan Note (Signed)
Summary: 1 YR FU PER JUNE REMINDER-SRS   Visit Type:  Follow-up Primary Provider:  Elon Alas   History of Present Illness: the patient is a 70 year old female history of dyspnea and pericardial effusion. The patient also is status post thoracentesis. This was several years ago. She is now doing much better. She does have a mild degree of interstitial lung disease by chest x-ray but has experienced no worsening of her dyspnea. She also reports no cough.pulmonary function studies were consistent with mild restrictive lung disease. The patient denies any chest pain. It has a history of diabetes mellitus. She denies any orthopnea PND palpitations or syncope  Preventive Screening-Counseling & Management  Alcohol-Tobacco     Smoking Status: never  Current Medications (verified): 1)  Miralax  Powd (Polyethylene Glycol 3350) .... Use As Directed As Needed 2)  Potassium Chloride Crys Cr 20 Meq Cr-Tabs (Potassium Chloride Crys Cr) .... Take One Tablet By Mouth Daily 3)  Aspirin 81 Mg Tbec (Aspirin) .... Take 1 Tablet By Mouth Once A Day 4)  Pre-Natal Formula  Tabs (Prenatal Multivit-Min-Fe-Fa) .... Take 1 Tablet By Mouth Once A Day 5)  Furosemide 20 Mg Tabs (Furosemide) .... Take 1 Tablet By Mouth Once A Day 6)  Metoprolol Tartrate 25 Mg Tabs (Metoprolol Tartrate) .... Take 1 Tablet By Mouth Two Times A Day 7)  Lantus 100 Unit/ml Soln (Insulin Glargine) .... Use As Directed 8)  Caltrate 600+d 600-400 Mg-Unit Tabs (Calcium Carbonate-Vitamin D) .... Take 1 Tablet By Mouth Once A Day 9)  Simvastatin 40 Mg Tabs (Simvastatin) .... Take 1 Tablet By Mouth Once A Day 10)  Alprazolam 0.5 Mg Tabs (Alprazolam) .... Take 1 Tablet By Mouth Once A Day 11)  Omeprazole 40 Mg Cpdr (Omeprazole) .... Take 1 Tablet By Mouth Once A Day 12)  Novolog Flexpen 100 Unit/ml Soln (Insulin Aspart) .... Use As Directed 13)  Levothyroxine Sodium 50 Mcg Tabs (Levothyroxine Sodium) .... Take 1 Tablet By Mouth Once A Day 14)   Fish Oil 1200 Mg Caps (Omega-3 Fatty Acids) .... Take 1 Tablet By Mouth Once A Day  Allergies: 1)  Codeine  Comments:  Nurse/Medical Assistant: The patient's medication list and allergies were reviewed with the patient and were updated in the Medication and Allergy Lists.  Past History:  Past Medical History: Last updated: 02/27/2008 history of exertional dyspnea with moderate pericardial effusion.   status post positive clue Kussmaul but ruled out for constrictive pericarditis by cardiac catheterization status-post fevers possible pneumonia ruled out for tuberculosis at [facility] right carotid bruit, no current workup yet  granulomatous calcification of the liver and spleen anemia status post thoracentesis diabetes mellitus significant calcification of the pericardium. prolonged elevated sedimentation rate now normalized  Past Surgical History: Last updated: 02/27/2008 incisional hernia repair hysterectomy tubular ligation  Family History: Last updated: 02/27/2008 negative for premature coronary artery disease and cancer.  Father died from complications of a motor vehicle accident and her mother died from a stroke.  Social History: Last updated: 02/27/2008 the patient is widowed and has two grown daughters.  She remains active physically.  She does not use alcohol, tobacco or street drugs.  There is no prior history of TB exposure  Risk Factors: Smoking Status: never (10/25/2009)  Social History: Smoking Status:  never  Vital Signs:  Patient profile:   70 year old female Height:      59 inches Weight:      123 pounds BMI:     24.93 Pulse  rate:   60 / minute BP sitting:   127 / 79  (left arm) Cuff size:   regular  Vitals Entered By: Carlye Grippe (October 25, 2009 1:48 PM)  Physical Exam  Additional Exam:  General: Well-developed, well-nourished in no distress head: Normocephalic and atraumatic eyes PERRLA/EOMI intact, conjunctiva and lids  normal nose: No deformity or lesions mouth normal dentition, normal posterior pharynx neck: Supple, no JVD.  No masses, thyromegaly or abnormal cervical nodes lungs: Normal breath sounds bilaterally without wheezing.  Normal percussion heart: regular rate and rhythm with normal S1 and S2, no S3 or S4.  PMI is normal.  No pathological murmurs abdomen: Normal bowel sounds, abdomen is soft and nontender without masses, organomegaly or hernias noted.  No hepatosplenomegaly musculoskeletal: Back normal, normal gait muscle strength and tone normal pulsus: Pulse is normal in all 4 extremities Extremities: No peripheral pitting edema neurologic: Alert and oriented x 3 skin: Intact without lesions or rashes cervical nodes: No significant adenopathy psychologic: Normal affect    Impression & Recommendations:  Problem # 1:  DYSPNEA (ICD-786.05) dyspnea is much improved. The patient does have a chest x-ray some interstitial lung disease as well as restrictive palmar function studies. Clinically however she has improved I gave her the option to perform her chest x-ray but she declined. Her updated medication list for this problem includes:    Aspirin 81 Mg Tbec (Aspirin) .Marland Kitchen... Take 1 tablet by mouth once a day    Furosemide 20 Mg Tabs (Furosemide) .Marland Kitchen... Take 1 tablet by mouth once a day    Metoprolol Tartrate 25 Mg Tabs (Metoprolol tartrate) .Marland Kitchen... Take 1 tablet by mouth two times a day  Problem # 2:  DM (ICD-250.00) followup primary care physician Her updated medication list for this problem includes:    Aspirin 81 Mg Tbec (Aspirin) .Marland Kitchen... Take 1 tablet by mouth once a day    Lantus 100 Unit/ml Soln (Insulin glargine) ..... Use as directed    Novolog Flexpen 100 Unit/ml Soln (Insulin aspart) ..... Use as directed  Problem # 3:  PERICARDIAL EFFUSION (ICD-423.9) resolved Her updated medication list for this problem includes:    Furosemide 20 Mg Tabs (Furosemide) .Marland Kitchen... Take 1 tablet by mouth once  a day  Patient Instructions: 1)  Your physician recommends that you continue on your current medications as directed. Please refer to the Current Medication list given to you today. 2)  Follow up in  1 year

## 2010-06-04 NOTE — Miscellaneous (Signed)
Summary: Orders Update  Clinical Lists Changes  Orders: Added new Referral order of Nuclear Med (Nuc Med) - Signed 

## 2010-06-04 NOTE — Miscellaneous (Signed)
Summary: Orders Update  Clinical Lists Changes  Problems: Added new problem of CHEST PAIN, PRECORDIAL (ICD-786.51) Orders: Added new Referral order of 2-D Echocardiogram (2D Echo) - Signed

## 2010-06-04 NOTE — Letter (Signed)
Summary: [facility]  [facility]   Imported By: Lennie Odor 01/09/2010 10:09:12  _____________________________________________________________________  External Attachment:    Type:   Image     Comment:   External Document

## 2010-06-04 NOTE — Letter (Signed)
Summary: [facility]  [facility]   Imported By: Lennie Odor 01/09/2010 10:08:28  _____________________________________________________________________  External Attachment:    Type:   Image     Comment:   External Document

## 2010-06-04 NOTE — Letter (Signed)
Summary: EGD Instructions  [name]  [address] [place], [place] [address]   Phone: [phone]  Fax: [phone]       Ellen Woods    01/24/1941    MRN: [id number]       Procedure Day [name]: [name], 01/08/10     Arrival Time: 8:00 AM     Procedure Time: 9:00 AM     Location of Procedure:                    _X_ [name] ( Outpatient Registration)   PREPARATION FOR ENDOSCOPY   On TUESDAY, 01/08/10, THE DAY OF THE PROCEDURE:  1.   No solid foods, milk or milk products are allowed after midnight the night before your procedure.  2.   Do not drink anything colored red or purple.  Avoid juices with pulp.  No orange juice.  3.  You may drink clear liquids until 5:00 AM, which is 4 hours before your procedure.                                                                                                CLEAR LIQUIDS INCLUDE: Water Jello Ice Popsicles Tea (sugar ok, no milk/cream) Powdered fruit flavored drinks Coffee (sugar ok, no milk/cream) Gatorade Juice: apple, white grape, white cranberry  Lemonade Clear bullion, consomm, broth Carbonated beverages (any kind) Strained chicken noodle soup Hard Candy   MEDICATION INSTRUCTIONS  Unless otherwise instructed, you should take regular prescription medications with a small sip of water as early as possible the morning of your procedure.  Diabetic patients - see separate instructions.  Additional medication instructions: HOLD FUROSEMIDE MORNING OF PROCEDURE.             OTHER INSTRUCTIONS  You will need a responsible adult at least 70 years of age to accompany you and drive you home.   This person must remain in the waiting room during your procedure.  Wear loose fitting clothing that is easily removed.  Leave jewelry and other valuables at home.  However, you may wish to bring a book to read or an iPod/MP3 player to listen to music as you wait for your procedure to  start.  Remove all body piercing jewelry and leave at home.  Total time from sign-in until discharge is approximately 2-3 hours.  You should go home directly after your procedure and rest.  You can resume normal activities the day after your procedure.  The day of your procedure you should not:   Drive   Make legal decisions   Operate machinery   Drink alcohol   Return to work  You will receive specific instructions about eating, activities and medications before you leave.    The above instructions have been reviewed and explained to me by   _______________________    I fully understand and can verbalize these instructions _____________________________ Date _________

## 2010-06-06 NOTE — Cardiovascular Report (Signed)
Summary: Cardiac Catheterization  Cardiac Catheterization   Imported By: Dorise Hiss 05/29/2010 11:57:26  _____________________________________________________________________  External Attachment:    Type:   Image     Comment:   External Document

## 2010-06-06 NOTE — Progress Notes (Signed)
Summary: need carotid doppler  ---- Converted from flag ---- ---- 05/21/2010 4:18 PM, Lewayne Bunting, MD, [facility] wrote: Inocencio Homes, can you schedule carotid Dopplers I actually think we did this. Let's bring the patient back in a few weeks and discuss the possibility of a CardioNet monitor and/or stress test.  ---- 05/21/2010 10:58 AM, Verne Carrow, MD wrote: Michelle Piper, Ms. Kaelin did well with her cath. Her hemodynamics were not suggestive of constriction. Her coronaries are completely unchanged from cath in 2009. She has no chest pain. I can't explain her dyspnea based on cath findings. She did have run of SVT in cath lab so I may consider outpatient monitor and consider stress myoview to exclude anterior ischemia with moderate LAD lesions. She also asked me about follow up for her carotid artery disease. I am not sure when her last doppler was.   Thanks,  Thayer Ohm ------------------------------  Phone Note Other Incoming   Summary of Call: Patient already has f/u OV scheduled for 1/25.  Not sure that we can get carotid doppler done before then.  Okay to wait and schedule when you see her next Wednesday? Hoover Brunette, LPN  May 23, 2010 5:42 PM   Follow-up for Phone Call        Yes Follow-up by: Lewayne Bunting, MD, [facility],  May 24, 2010 11:11 AM

## 2010-06-06 NOTE — Miscellaneous (Signed)
Summary: Orders Update - Cath  Clinical Lists Changes  Orders: Added new Referral order of Cardiac Catheterization (Cardiac Cath) - Signed

## 2010-06-06 NOTE — Letter (Signed)
Summary: Generic Letter  Architectural technologist, Main Office  [address]   [place], [place] [address]   Phone: [phone]  Fax: [phone]        April 17, 2010 MRN: [id number]    [facility] [address] [place], [place]  [address]    Dear Ms. Benedetti,  You are scheduled for Cardiac CT on : 04/23/10 at 2pm.  DO NOT EAT OR DRINK ANYTHING AFTER 10AM, THE MORNING OF YOUR PROCEDURE.  PLEASE ARRIVE AT THE [place] OUT-PATIENT ADMISSION OFFICE LOCATED ON THE FIRST FLOOR NEAR THE GIFT SHOP, 1 HOUR PRIOR TO APPOINTMENT TIME.    Sincerely,  Merita Norton Lloyd-Fate  This letter has been electronically signed by your physician.

## 2010-06-06 NOTE — Letter (Signed)
Summary: Internal Correspondence/ FAXED PRE-CATH ORDER  Internal Correspondence/ FAXED PRE-CATH ORDER   Imported By: Dorise Hiss 05/16/2010 15:05:36  _____________________________________________________________________  External Attachment:    Type:   Image     Comment:   External Document

## 2010-06-06 NOTE — Letter (Signed)
Summary: Cardiac Cath Instructions - [facility]  [facility] [address]   [place], [place] [address]   Phone: [phone]  Fax: [phone]     05/14/2010 MRN: [id number]  [facility] [address] [place], [place]  [address]  Dear Ms. Aldrete,   You are scheduled for a Cardiac Catheterization on Tuesday, January 17 at 7:30 with Dr. Sanjuana Kava.  Please arrive to the 1st floor of the Heart and Vascular Center at [facility] at 6:30 am on the day of your procedure. Please do not arrive before 6:30 a.m. Call the Heart and Vascular Center at [phone] if you are unable to make your appointmnet. The Code to get into the parking garage under the building is 0030. Take the elevators to the 1st floor. You must have someone to drive you home. Someone must be with you for the first 24 hours after you arrive home. Please wear clothes that are easy to get on and off and wear slip-on shoes. Do not eat or drink after midnight except water with your medications that morning. Bring all your medications and current insurance cards with you.  _X__ DO NOT take these medications before your procedure:  Furosemide, Lantus, or Novolog Flexpen   _X__ Make sure you take your aspirin.  ___ You may take ALL of your medications with water that morning.  ___ DO NOT take ANY medications before your procedure.  ___ Pre-med instructions:  ________________________________________________________________________  The usual length of stay after your procedure is 2 to 3 hours. This can vary.  If you have any questions, please call the office at the number listed above.  Hoover Brunette, LPN                Directions to the [facility]  Please Note : Park in [place] under the building not the parking deck.  From [employer]: Turn onto [address] Left onto [address] (1st stoplight) Right at the brick entrance to the  hospital ([address]) Bear to the right and you will see a blue sign "Heart and Vascular Center" Parking garage is a sharp right'to get through the gate out in the code _______. Once you park, take the elevator to the first floor. Please do not arrive before 0630am. The building will be dark before that time.   From [address] Turn onto [address] Turn left into the brick entrance to the hospital (Main circle drive) Bear to the right and you will see a blue sign "Heart and Vascular Center" Parking garage is a sharp right, to get thru the gate put in the code ____. Once you park, take the elevator to the first floor. Please do not arrive before 0630am. The building will be dark before that time

## 2010-06-06 NOTE — Assessment & Plan Note (Signed)
Summary: f6w  --agh   Visit Type:  Follow-up Primary Provider:  Kingsley Spittle   History of Present Illness: the patient is a 70 year old female with a history of coronary artery disease, pericardial calcification ruled out for constrictive pericarditis several years ago and more recently diagnosed with a small PDA [4 mm]. She was seen in the office recently because of increased complaints of shortness of breath on minimal exertion. She denies however any chest pain. She had nonobstructive disease in 2009. LV function remains normal. A CT angiogram was performed to evaluate PDA and confirmed that this was a small connection, but she was also found to have at least per the reading of the CTA flow-limiting disease in the LAD both in the proximal and mid segments.  Although the patient denies any substernal chest pain minimal exertion causes her to have extreme shortness of breath. As noted above despite heavy pericardial calcification prior left and right heart catheterization did not demonstrate constrictive physiology.  The patient denies any palpitations presyncope or syncope. I brought the patient back to the office to discuss the need to proceed with a repeat cardiac catheterization.  Preventive Screening-Counseling & Management  Alcohol-Tobacco     Smoking Status: never  Current Medications (verified): 1)  Miralax  Powd (Polyethylene Glycol 3350) .... Use As Directed As Needed 2)  Potassium Chloride Crys Cr 20 Meq Cr-Tabs (Potassium Chloride Crys Cr) .... Take One Tablet By Mouth Daily 3)  Pre-Natal Formula  Tabs (Prenatal Multivit-Min-Fe-Fa) .... Take 1 Tablet By Mouth Once A Day 4)  Furosemide 20 Mg Tabs (Furosemide) .... Take 1 Tablet By Mouth Once A Day 5)  Metoprolol Tartrate 25 Mg Tabs (Metoprolol Tartrate) .... Take 1 11/2 Tabs (37.5mg ) Two Times A Day 6)  Lantus 100 Unit/ml Soln (Insulin Glargine) .... Use As Directed 7)  Caltrate 600+d 600-400 Mg-Unit Tabs (Calcium  Carbonate-Vitamin D) .... Take 1 Tablet By Mouth Once A Day 8)  Alprazolam 0.5 Mg Tabs (Alprazolam) .... Take 1 Tablet By Mouth Two Times A Day 9)  Omeprazole 40 Mg Cpdr (Omeprazole) .... Take 1 Tablet By Mouth Once A Day 10)  Novolog Flexpen 100 Unit/ml Soln (Insulin Aspart) .... Use As Directed 11)  Levothyroxine Sodium 50 Mcg Tabs (Levothyroxine Sodium) .... Take 1 Tablet By Mouth Once A Day 12)  Paroxetine Hcl 20 Mg Tabs (Paroxetine Hcl) .... As Needed 13)  Proctozone-Hc 2.5 % Crea (Hydrocortisone) .... Apply To Anal and Rectal Area Two Times A Day and As Needed 14)  Lisinopril 5 Mg Tabs (Lisinopril) .... Take One Daily 15)  Nitrostat 0.4 Mg Subl (Nitroglycerin) .Marland Kitchen.. 1 Tablet Under Tongue At Onset of Chest Pain; You May Repeat Every 5 Minutes For Up To 3 Doses. 16)  Aspirin 81 Mg Tbec (Aspirin) .... Take One Tablet By Mouth Daily 17)  Simvastatin 40 Mg Tabs (Simvastatin) .... Take 1 Tablet By Mouth Once A Day 18)  Ferrous Sulfate 325 (65 Fe) Mg Tabs (Ferrous Sulfate) .... Take 1 Tablet By Mouth Once A Day As Needed  Allergies (verified): 1)  ! * Azithromycin 2)  Codeine  Comments:  Nurse/Medical Assistant: The patient's medication list and allergies were reviewed with the patient and were updated in the Medication and Allergy Lists.  Past History:  Past Surgical History: Last updated: 12/31/2009 incisional hernia repair hysterectomy Tubal Ligation  Family History: Last updated: March 19, 2010 Father died from complications of a motor vehicle accident and her mother died from a stroke. Sis:  CAD; s/p CABG in  60s with subsequent MI  Social History: Last updated: 02/27/2008 the patient is widowed and has two grown daughters.  She remains active physically.  She does not use alcohol, tobacco or street drugs.  There is no prior history of TB exposure  Risk Factors: Smoking Status: never (05/14/2010)  Past Medical History: History of exertional dyspnea with moderate pericardial  effusion with positive clue Kussmaul but ruled out for constrictive pericarditis by cardiac catheterization; possible pneumonia ruled out for tuberculosis at [facility] (2009) CAD   a.  Cath 09/2007:  60% prox. LAD; 50% mid LAD; 40% prox. 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 prolonged elevated sedimentation rate now normalized Anemia Diabetes mellitus Hypertension Hyperlipidemia Hypothyroidism Depression GERD   a.  s/p EGD with esophageal dilation in 12/2009 Cardiac CTA small PDA and possible high-grade stenosis of the mid and proximal LAD. Normal LV function  Vital Signs:  Patient profile:   70 year old female Height:      59 inches Weight:      124 pounds O2 Sat:      100 % Pulse rate:   97 / minute BP sitting:   136 / 81  (left arm) Cuff size:   regular  Vitals Entered By: Carlye Grippe (May 14, 2010 9:47 AM)  Physical Exam  Additional Exam:  General: Well-developed, well-nourished in no distress head: Normocephalic and atraumatic eyes PERRLA/EOMI intact, conjunctiva and lids normal nose: No deformity or lesions mouth normal dentition, normal posterior pharynx neck: Supple, no JVD.  No masses, thyromegaly or abnormal cervical nodes, soft right carotid bruit. No Kussmaul sign lungs: Normal breath sounds bilaterally without wheezing.  Normal percussion heart: regular rate and rhythm with normal S1 and S2, no S3 or S4.  no pericardial knock.PMI is normal.  No pathological murmurs abdomen: Normal bowel sounds, abdomen is soft and nontender without masses, organomegaly or hernias noted.  No hepatosplenomegaly musculoskeletal: Back normal, normal gait muscle strength and tone normal pulsus: Pulse is normal in all 4 extremities Extremities: No peripheral pitting edema neurologic: Alert and oriented x 3 skin: Intact without lesions or rashes cervical nodes: No significant  adenopathy psychologic: Normal affect    Impression & Recommendations:  Problem # 1:  PATENT DUCTUS ARTERIOSUS (ICD-747.0) small PDA both by echocardiogram and cardiac CTA. Unlikely cause of shortness of breath. Right heart function remains normal.  Problem # 2:  CORONARY ARTERY DISEASE (ICD-414.00) the patient not be a self-limiting disease by cardiac CT and could be an explanation for her recent increase in dyspnea. We will proceed with a left heart catheterization as well as a right heart catheterization given the findings of the PDA and a prior history of pericarditis or pericardial calcifications. Her updated medication list for this problem includes:    Metoprolol Tartrate 25 Mg Tabs (Metoprolol tartrate) .Marland Kitchen... Take 1 11/2 tabs (37.5mg ) two times a day    Lisinopril 5 Mg Tabs (Lisinopril) .Marland Kitchen... Take one daily    Nitrostat 0.4 Mg Subl (Nitroglycerin) .Marland Kitchen... 1 tablet under tongue at onset of chest pain; you may repeat every 5 minutes for up to 3 doses.    Aspirin 81 Mg Tbec (Aspirin) .Marland Kitchen... Take one tablet by mouth daily  Problem # 3:  OTHER SPECIFIED DISEASES OF PERICARDIUM (ICD-423.8) clinically no definite evidence of pericardial constriction. No Kussmaul sign. We'll further evaluate with left and right heart catheterization.    Furosemide 20 Mg Tabs (Furosemide) .Marland Kitchen... Take  1 tablet by mouth once a day    Lisinopril 5 Mg Tabs (Lisinopril) .Marland Kitchen... Take one daily  Other Orders: T-Basic Metabolic Panel [id number]) T-CBC No Diff ([id number]) T-Protime, Auto ([id number]) T-PTT ([id number]) T-Chest x-ray, 2 views (66440)  Patient Instructions: 1)  Left & Right JV Cath 2)  Follow up in  3 months

## 2010-06-06 NOTE — Progress Notes (Signed)
Summary: [facility] Note   [facility] Office Note   Imported By: Roderic Ovens 04/16/2010 11:15:19  _____________________________________________________________________  External Attachment:    Type:   Image     Comment:   External Document

## 2010-06-06 NOTE — Assessment & Plan Note (Signed)
Summary: eph post Cath one week per Dr. Clifton James   Visit Type:  Follow-up Primary Provider:  Kingsley Spittle   History of Present Illness: the patient is a 70 year old female with a history of a small PDA documented by CTangiography, pericardial calcification, previous nonobstructive coronary artery disease and previously ruled out for constrictive pericarditis.    The patient was seen in the office last time we talked about further evaluating what appeared to be a high-grade lesion in the LAD by CT angiography.  She also reported some shortness of breath which is chronic and in retrospect appears to be related to deconditioning.  Left and right heart catheterization was done and the patient had normal right-sided heart pressures with no evidence of pulmonary hypertension.  She had moderate nonobstructive coronary artery disease with 50% lesions in the LAD which Dr. Sanjuana Kava felt could be treated medically.  The patient is a matter fact does not report any chest pain she has chronic dyspnea on exertion again likely secondary to deconditioning but no palpitations presyncope or syncope.  She has been reassured by the findings on her recent cardiac catheterization  Preventive Screening-Counseling & Management  Alcohol-Tobacco     Smoking Status: never  Current Medications (verified): 1)  Miralax  Powd (Polyethylene Glycol 3350) .... Use As Directed As Needed 2)  Potassium Chloride Crys Cr 20 Meq Cr-Tabs (Potassium Chloride Crys Cr) .... Take One Tablet By Mouth Daily 3)  Pre-Natal Formula  Tabs (Prenatal Multivit-Min-Fe-Fa) .... Take 1 Tablet By Mouth Once A Day 4)  Furosemide 20 Mg Tabs (Furosemide) .... Take 1 Tablet By Mouth Once A Day 5)  Metoprolol Tartrate 25 Mg Tabs (Metoprolol Tartrate) .... Take 1 Tablet By Mouth Two Times A Day 6)  Lantus 100 Unit/ml Soln (Insulin Glargine) .... Use As Directed 7)  Caltrate 600+d 600-400 Mg-Unit Tabs (Calcium Carbonate-Vitamin D) .... Take 1  Tablet By Mouth Once A Day 8)  Alprazolam 0.5 Mg Tabs (Alprazolam) .... Take 1 Tablet By Mouth Two Times A Day 9)  Omeprazole 40 Mg Cpdr (Omeprazole) .... Take 1 Tablet By Mouth Once A Day 10)  Novolog Flexpen 100 Unit/ml Soln (Insulin Aspart) .... Use As Directed 11)  Levothyroxine Sodium 50 Mcg Tabs (Levothyroxine Sodium) .... Take 1 Tablet By Mouth Once A Day 12)  Paroxetine Hcl 20 Mg Tabs (Paroxetine Hcl) .... As Needed 13)  Proctozone-Hc 2.5 % Crea (Hydrocortisone) .... Apply To Anal and Rectal Area Two Times A Day and As Needed 14)  Lisinopril 5 Mg Tabs (Lisinopril) .... Take One Daily 15)  Nitrostat 0.4 Mg Subl (Nitroglycerin) .Marland Kitchen.. 1 Tablet Under Tongue At Onset of Chest Pain; You May Repeat Every 5 Minutes For Up To 3 Doses. 16)  Aspirin 81 Mg Tbec (Aspirin) .... Take One Tablet By Mouth Daily 17)  Simvastatin 40 Mg Tabs (Simvastatin) .... Take 1 Tablet By Mouth Once A Day 18)  Ferrous Sulfate 325 (65 Fe) Mg Tabs (Ferrous Sulfate) .... Take 1 Tablet By Mouth Once A Day As Needed  Allergies (verified): 1)  ! * Azithromycin 2)  Codeine  Comments:  Nurse/Medical Assistant: The patient's medication list and allergies were reviewed with the patient and were updated in the Medication and Allergy Lists.  Past History:  Past Surgical History: Last updated: 12/31/2009 incisional hernia repair hysterectomy Tubal Ligation  Family History: Last updated: 04-06-10 Father died from complications of a motor vehicle accident and her mother died from a stroke. Sis:  CAD; s/p CABG in 74s with subsequent  MI  Social History: Last updated: 02/27/2008 the patient is widowed and has two grown daughters.  She remains active physically.  She does not use alcohol, tobacco or street drugs.  There is no prior history of TB exposure  Risk Factors: Smoking Status: never (05/29/2010)  Past Medical History: History of exertional dyspnea with moderate pericardial effusion with positive clue  Kussmaul but ruled out for constrictive pericarditis by cardiac catheterization; possible pneumonia ruled out for tuberculosis at [facility] (2009) CAD   a.  Cath 09/2007:  60% prox. LAD; 50% mid LAD; 40% prox. 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 prolonged elevated sedimentation rate now normalized Anemia Diabetes mellitus Hypertension Hyperlipidemia Hypothyroidism Depression GERD   a.  s/p EGD with esophageal dilation in 12/2009 Cardiac CTA small PDA and possible high-grade stenosis of the mid and proximal LAD. Normal LV function cardiac catheterization 2012 normal right heart sided pressures, nonobstructive coronary disease with 50% lesions in the LAD, normal LV function, no evidence of constriction, no pulmonary hypertension  Review of Systems       The patient complains of shortness of breath.  The patient denies fatigue, malaise, fever, weight gain/loss, vision loss, decreased hearing, hoarseness, chest pain, palpitations, prolonged cough, wheezing, sleep apnea, coughing up blood, abdominal pain, blood in stool, nausea, vomiting, diarrhea, heartburn, incontinence, blood in urine, muscle weakness, joint pain, leg swelling, rash, skin lesions, headache, fainting, dizziness, depression, anxiety, enlarged lymph nodes, easy bruising or bleeding, and environmental allergies.    Vital Signs:  Patient profile:   70 year old female Height:      59 inches Weight:      124 pounds Pulse rate:   103 / minute BP sitting:   120 / 76  (left arm) Cuff size:   regular  Vitals Entered By: Carlye Grippe (May 29, 2010 1:15 PM)  Physical Exam  Additional Exam:  General: Well-developed, well-nourished in no distress head: Normocephalic and atraumatic eyes PERRLA/EOMI intact, conjunctiva and lids normal nose: No deformity or lesions mouth normal dentition, normal posterior pharynx neck: Supple,  no JVD.  No masses, thyromegaly or abnormal cervical nodes, soft right carotid bruit. No Kussmaul sign lungs: Normal breath sounds bilaterally without wheezing.  Normal percussion heart: regular rate and rhythm with normal S1 and S2, no S3 or S4.  no pericardial knock.PMI is normal.  No pathological murmurs abdomen: Normal bowel sounds, abdomen is soft and nontender without masses, organomegaly or hernias noted.  No hepatosplenomegaly musculoskeletal: Back normal, normal gait muscle strength and tone normal pulsus: Pulse is normal in all 4 extremities Extremities: No peripheral pitting edema neurologic: Alert and oriented x 3 skin: Intact without lesions or rashes cervical nodes: No significant adenopathy psychologic: Normal affect    Impression & Recommendations:  Problem # 1:  CORONARY ARTERY DISEASE (ICD-414.00) the patient has nonobstructive coronary artery disease no definite indication for intervention.  Will continue risk factor modification Her updated medication list for this problem includes:    Metoprolol Tartrate 25 Mg Tabs (Metoprolol tartrate) .Marland Kitchen... Take 1 tablet by mouth two times a day    Lisinopril 5 Mg Tabs (Lisinopril) .Marland Kitchen... Take one daily    Nitrostat 0.4 Mg Subl (Nitroglycerin) .Marland Kitchen... 1 tablet under tongue at onset of chest pain; you may repeat every 5 minutes for up to 3 doses.    Aspirin 81 Mg Tbec (Aspirin) .Marland Kitchen... Take one tablet by mouth daily  Problem # 2:  PATENT DUCTUS ARTERIOSUS (ICD-747.0) asymptomatic small PDA documented by CT angiography.  No further intervention required  Problem # 3:  ESSENTIAL HYPERTENSION, BENIGN (ICD-401.1) blood pressure is well controlled.  The patient states that her blood pressure runs too low with the Toprol 37.5 twice a day therefore we will decrease her to 25 mg p.o. b.i.d. Her updated medication list for this problem includes:    Furosemide 20 Mg Tabs (Furosemide) .Marland Kitchen... Take 1 tablet by mouth once a day    Metoprolol Tartrate  25 Mg Tabs (Metoprolol tartrate) .Marland Kitchen... Take 1 tablet by mouth two times a day    Lisinopril 5 Mg Tabs (Lisinopril) .Marland Kitchen... Take one daily    Aspirin 81 Mg Tbec (Aspirin) .Marland Kitchen... Take one tablet by mouth daily  Patient Instructions: 1)  Your physician recommends that you continue on your current medications as directed. Please refer to the Current Medication list given to you today. 2)  Follow up in  1 year

## 2010-07-18 LAB — GLUCOSE, CAPILLARY: Glucose-Capillary: 96 mg/dL (ref 70–99)

## 2010-09-17 NOTE — Assessment & Plan Note (Signed)
[facility]                          [facility]   Raeya, Merritts KADEY MIHALIC                     MRN:          [id number]  DATE:10/09/2008                            DOB:          Apr 29, 1941    HISTORY OF PRESENT ILLNESS:  The patient is a 70 year old female with  prior history of dyspnea and pericardial effusion.  On prior  echocardiograms, this has resolved.  She also had an increased sed rate,  which has resolved.  The patient was ruled out for TB pericarditis, but  did have pneumonia.  She is doing remarkably well.  She states that she  is very active.  She has been moving furniture without any difficulty.  She denies any orthopnea or PND.  She does report some lower extremity  edema.  Her blood pressure has been well controlled.   MEDICATIONS:  1. Klor-Con 20 mEq p.o. daily.  2. Aspirin 81 mg p.o. daily.  3. Multivitamin daily.  4. Ferrous sulfate 325 mg p.o. daily.  5. Lipitor 80 mg half-tablet p.o. daily.  6. Vitamin D 1000 units daily.  7. Furosemide 20 mg at a.m.  8. Metoprolol 25 mg p.o. b.i.d.  9. Centrum.  10.Multivitamin.  11.Lantus insulin 15 units as directed.   PHYSICAL EXAMINATION:  VITAL SIGNS:   PHYSICAL EXAMINATION:  VITAL SIGNS:  Blood pressure 133/62, heart rate  63, and weight 117 pounds.  GENERAL:  Well-nourished white female in no apparent distress.  HEENT:  Pupils, eyes are equal.  Conjunctivae clear.  NECK:  Supple.  Normal carotid upstroke.  No carotid bruits.  LUNGS:  Clear breath sounds bilaterally.  HEART:  Regular rate and rhythm with normal S1 and S2.  No murmur, rubs,  or gallops.  ABDOMEN:  Soft and nontender.  No rebound or guarding.  Good bowel  sounds.  EXTREMITIES:  No cyanosis, clubbing, or edema.   A 12-lead electrocardiogram, normal sinus rhythm, and no acute ischemic  changes.   PROBLEM LIST:  1. Status post exertional dyspnea, resolved.  2. Status post pericardial effusion  resolved.  Ruled out for      constrictive pericarditis.  3. History of anemia followed by [facility]  4. Status post thoracentesis.  5. Diabetes mellitus.  6. Status post elevated sed rate, resolved.   PLAN:  1. The patient is doing remarkably well.  She can continue her current      medication regimen.  2. The patient does report some edema occasionally, which I suspect is      secondary to venous insufficiency.  I told her that it would be      okay on occasion to take an extra Lasix, if she felt there was      fluid overload.  3. The patient can follow up for her future medical care at [facility] at this point, from a cardiac      standpoint.  We will see her back in 1 year.     Learta Codding, MD,FACC  Electronically Signed    GED/MedQ  DD: 10/09/2008  DT: 10/10/2008  Job #: [id number]   cc:   [facility]

## 2010-09-17 NOTE — Assessment & Plan Note (Signed)
[name]                         GASTROENTEROLOGY OFFICE NOTE   Ellen Woods, Ellen Woods                     MRN:          [id number]  DATE:08/02/2007                            DOB:          [birth year]    CHIEF COMPLAINT:  Screening colonoscopy.   This is a 70 year old white woman with chronic constipation, but there  is no change in bowel habits.  No rectal bleeding, again, no bowel habit  changes.  She has had variable bowel movements for a number of years now  without any significant change in that.  She has never had a colonoscopy  and wanted to get one, so she made an appointment.  She is here because  she is on insulin, and that needs to be adjusted prior to her  colonoscopy.   PAST MEDICAL HISTORY:  1. Hypertension.  2. Some unknown dysrhythmia.  3. Diabetes mellitus type 2 since 1977.  4. Dyslipidemia.  5. Hernia repair, December 1997.  6. Carotid stenosis, perhaps.   MEDICATIONS:  1. Lipitor 40 mg daily.  2. Alprazolam 0.5 mg daily.  3. Amlodipine.  4. Benazepril 25/10 mg daily.  5. Lantus 22 units in the morning.  6. NovoLog sliding scale.  7. Centrum Silver daily.  8. Calcium 600 plus D 400 units daily.  9. Garlic 1200 mg daily.  10.Gelatin 650 mg daily.   There are no known drug allergies.   Stool softener is used.   FAMILY HISTORY:  Her daughter has had some sort of open heart surgery,  diabetes in her sister, son had malignancy of the pharynx and died at  age 101.   SOCIAL HISTORY:  She is widowed.  She is here with her daughter.  She is  retired from [employer].  No alcohol, tobacco, or drugs.   REVIEW OF SYSTEMS:  Some pedal edema.  All other systems negative.   PHYSICAL EXAMINATION:  Reveals a well-developed, well-nourished white  woman.  Height 4 feet 11 inches, weight 120 pounds, blood pressure 180/60, pulse  82.  EYES:  Anicteric.  LUNGS:  Clear.  HEART:  S1, S2.  A 2/6 systolic murmur heard from the left  sternal  border to the apex, consistent with mitral regurgitation.  ABDOMEN:  Soft and nontender, no organomegaly or mass.  PSYCH:  She is alert and oriented x3.   ASSESSMENT:  This is an average-risk colon cancer patient.  She does  have the chronic constipation, but that is not really a new or relevant  symptom in my estimation.   PLAN:  Screening colonoscopy.  We will hold her morning insulin and  adjust her evening NovoLog if needed.  She is on a sliding scale for  that and can do so on her own.  Risks, benefits, and indications of  colonoscopy are explained.  She understands and agrees to proceed.   She does not recall having a heart murmur before.  Would defer back to  [facility] for any further workup of this if not  done previously.     Iva Boop, MD,FACG  Electronically Signed  CEG/MedQ  DD: 08/02/2007  DT: 08/02/2007  Job #: [id number]   cc:   Phill Myron, FNP

## 2010-09-17 NOTE — Assessment & Plan Note (Signed)
[facility]                          [facility]   Ellen Woods, Ellen Woods                     MRN:          [id number]  DATE:02/24/2008                            DOB:          1940-12-08    REFERRING PHYSICIAN:  Dorothyann Peng. Bevelyn Buckles, NP   HISTORY OF PRESENT ILLNESS:  The patient is a 70 year old female with a  prior history of exertional dyspnea and pericardial effusion.  The  patient has been ruled out for constrictive pericarditis by cardiac  catheterization despite the fact that at the time of hospitalization,  she had a Kussmaul sign as well as significant noncircumferential  calcification of the pericardium.   On the followup echo, the patient's pericardial effusion resolved.  Her  sed rate also has improved.  The patient at the time had fevers, but  they have resolved and she had a presumed pneumonia.  She has been ruled  out for TB pericarditis.   The patient states that she is now doing well.  In particular, she has  minimal dyspnea on exertion.  She has no chest pain.  She is quite  animated in the office and has no specific complaints.  She reports no  palpitations.   MEDICATIONS:  1. Klor-Con 20 mEq p.o. daily.  2. Aspirin 81 mg p.o. daily.  3. Multivitamin daily.  4. Ferrous sulfate 325 daily.  5. Lipitor 80 mg half-tablet p.o. nightly.  6. Furosemide 20 mg q.a.m.  7. Metoprolol tartrate 25 mg p.o. b.i.d.  8. Centrum.  9. Multivitamin.  10.Lantus 15 units q.a.m.   PHYSICAL EXAMINATION:  VITAL SIGNS:  Blood pressure 120/64, heart rate  70, and weight 115 pounds.  NECK:  Normal carotid upstroke.  No carotid bruit.  No definite Kussmaul  sign.  LUNGS:  Clear breath sounds bilaterally.  HEART:  Regular rate and rhythm with normal S1 and S2 and no murmurs.  There is no pericardial friction or rub.  ABDOMEN:  Soft and nontender.  No rebound or guarding.  Good bowel  sounds.  EXTREMITIES:  No cyanosis, clubbing, or  edema.  NEUROLOGIC:  The patient is alert and oriented, grossly nonfocal.   PROBLEM LIST:  1. History of exertional dyspnea and moderate pericardial effusion.  2. Status post positive Kussmaul sign, but ruled out for constrictive      pericarditis by catheterization.  3. Status post fevers with possible pneumonia, ruled out for      tuberculosis.  4. Right carotid bruit.  No current workup yet and granulomatous      calcification of the liver and spleen.  5. Anemia.  6. Status post thoracentesis.  7. Diabetes mellitus.  8. Prolonged elevation of sedimentation rate, now normalized.   PLAN:  1. I have asked the patient to decrease her metoprolol to 12.5 p.o.      b.i.d.  Lasix should be 20 mg p.o. daily.  2. We will reorder an echocardiogram for follow up of her prior      cardiac pericardial effusion and continue to closely follow her for      constrictive pericarditis given her  significant pericardial      calcification.  3. Sedimentation rate is now near normal and has decreased      significantly from prior, it is down to 23.  The patient will be      seen in followup in the next 6 months.     Learta Codding, MD,FACC  Electronically Signed    GED/MedQ  DD: 02/24/2008  DT: 02/27/2008  Job #: [id number]   cc:   Dorothyann Peng. Bevelyn Buckles, MD

## 2010-09-17 NOTE — Assessment & Plan Note (Signed)
[facility]                          Ellen Woods   Ellen Woods, Ellen Woods                     MRN:          [id number]  DATE:10/14/2007                            DOB:          July 04, 1940    REFERRING PHYSICIAN:  Dr. _____   REFERRING PHYSICIAN:  Hospitalist Service, Dr.Parsons   HISTORY OF PRESENT ILLNESS:  The patient is a 70 year old female  referred to [facility] for exertional dyspnea and a moderate-  sized pericardial effusion with a Kussmaul sign on Sep 10, 2007.  The  patient underwent left and right heart catheterization, had equalization  of diastolic pressures but without definite constrictive physiology.  The patient also was found to have a left pleural effusion, underwent  thoracentesis.  She was seen by the ID service to rule out TB.  The  patient had left lower lobe pneumonia with fevers as well as  granulomatous calcification of the liver and spleen.  The patient  eventually ruled out for tuberculosis.  She was treated for pneumonia.  Of Woods was that she was profoundly anemic during the hospitalization  with a hemoglobin of 9.5, I do not see a significant workup on the  latter.  Thoracentesis also showed no infections etiology of her left  pleural effusion.  The patient now presents for follow-up.  Overall she  is doing better, she has less shortness of breath but she still appears  to be quite pale and may well be anemic.  I also reviewed the laboratory work from [facility] and do not  see any data to rule out the possibility of collagen vascular disease.  Of Woods is that the patient had significant pericardial calcification  noted on chest x-ray and CT.   MEDICATIONS:  1. Furosemide 40 mg p.o. b.i.d.  2. Klor-Con 20 mg p.o. daily.  3. Toprol 25 mg a half a tablet b.i.d.  4. Aspirin 81 mg p.o. daily.  5. Multivitamin.  6. Ferrous sulfate.  7. Lipitor 20 mg p.o. daily.  8. Vitamin D.   PHYSICAL EXAMINATION:  VITAL SIGNS:  Blood pressure is 128/66, heart  rate 74, weight 111 pounds.  NECK:  Normal carotid upstroke and no carotid bruits.  LUNGS:  Clear breath sounds bilaterally, somewhat diminished at the left  base but only mildly so.  HEART:  Regular rate and rhythm.  Normal S1-S2, no murmur, rubs or  gallops.  ABDOMEN:  Soft, nontender, no rebound or guarding, good bowel sounds.  EXTREMITIES:  No cyanosis, clubbing or edema.   PROBLEM LIST:  1. Exertional dyspnea.  2. Status post moderate pericardial calcification.  3. Status post positive Kussmaul sign, ruled out for constrictive      pericarditis.  4. Status post fevers, possible pneumonia, ruled out for tuberculosis.  5. Right carotid bruit (no apparent workup yet).  6. Granulomatous calcification of the liver and spleen.  7. Anemia.  8. Status post thoracentesis.  9. Diabetes mellitus.   PLAN:  1. The patient needs a full evaluation for collagen vascular disease      and she we will await blood  work done earlier so as not to      [employer] work.  2. I need to increase patient Lasix to 40 mg p.o. daily as she does      not appear volume overloaded.  3. Chest x-ray for follow-up including PA and lateral has been      requested.  4. Echocardiogram has also been ordered to rule out worsening of her      pericardial effusion.  5. The patient will follow up with me in the next couple of weeks to      finalize her treatment plan and also see if we need to further      workup for anemia.     Learta Codding, MD,FACC  Electronically Signed    GED/MedQ  DD: 10/14/2007  DT: 10/14/2007  Job #: [id number]   cc:   Dr. Arville Care

## 2010-09-17 NOTE — Assessment & Plan Note (Signed)
[facility]                          [facility]   DYLAN, RUOTOLO                     MRN:          [id number]  DATE:12/21/2007                            DOB:          08-12-1940    Ms. Ethier is here for followup, and she is quite stable.  She had been  sent to [facility] from [place] with a moderate-sized pericardial  effusion in May 2008.  She had right and left heart cath, and there was  some equalization of diastolic pressures but no constrictive physiology.  She had a left pleural effusion with thoracentesis and she was seen by  ID.  There was question of a left lower lobe pneumonia and some  granulomatous calcification of the liver and spleen.  She ruled out for  tuberculosis, and she was treated for pneumonia.  She was found to be  anemic, and I do not know the complete workup of this.  She stabilized.  She had pericardial calcification on her CT.  When she was seen back on  October 14, 2007, she was more stable.  Dr. Andee Lineman ordered an ANA and a  rheumatoid factor, which were normal.  Her sed rate was still elevated  at that time at 56 and that will be repeated today.  She has increased  her activities.  She has had no fevers.  She had a followup 2-D echo  done on October 20, 2007, ejection fraction was 60-65%.  There was trivial  pericardial effusion noted.   ALLERGIES:  CODEINE with nausea.   MEDICATIONS:  1. Potassium 20.  2. Aspirin 81.  3. Multivitamin.  4. Iron.  5. Lipitor.  6. Metoprolol.  7. Lantus.  8. Furosemide 40 (to be decreased to 20 mg daily).   OTHER MEDICAL PROBLEMS:  See the list below.   REVIEW OF SYSTEMS:  She is actually doing well.  Her review of systems  is negative.   PHYSICAL EXAMINATION:  Blood pressure is 140/76 with a pulse of 97.  Her  weight is 114 pounds.  HEENT reveals no xanthelasma.  She has normal  extraocular motion.  There are no carotid bruits.  There is no jugular  venous  distention.  Lungs are clear.  Respiratory effort is not labored.  Cardiac exam reveals an S1 with an S2.  There are no clicks or  significant murmurs.  The abdomen is soft.  She has no peripheral edema.   PROBLEMS:  1. Exertional dyspnea that is improved.  2. Status post moderate pericardial effusion, and this is      significantly improved by her followup echo in June.  3. Question of a Kussmaul sign and rule out constrictive pericarditis.      This was not proven in the cath lab.  4. Episode of fevers, possible pneumonia.  This resolved overtime, and      she was treated for pneumonia.  Tuberculosis was ruled out.  5. Question of history of a right carotid bruit.  She says that she      has had Dopplers in the past.  A repeat  has not yet been ordered.  6. Granulomatous calcification of the liver and spleen.  7. History of anemia.  8. Status post thoracentesis.  9. Diabetes.  10.Calcification of her pericardium.  11.Elevated sed rate of 56.   Sed rate will be repeated.  Lasix will be reduced to 20 mg daily, and  she will remain on potassium and then we will see her back for  cardiology followup.     Luis Abed, MD, [facility]  Electronically Signed    JDK/MedQ  DD: 12/21/2007  DT: 12/22/2007  Job #: [id number]   cc:   Meredith Mody, M.D.  Paulita Cradle, NP

## 2010-09-17 NOTE — Discharge Summary (Signed)
Ellen Woods, Ellen Woods              ACCOUNT NO.:  [id number]   MEDICAL RECORD NO.:  [id number]          PATIENT TYPE:  INP   LOCATION:  5507                         FACILITY:  [facility]   PHYSICIAN:  Madolyn Frieze. Jens Som, MD, FACCDATE OF BIRTH:  1941-04-09   DATE OF ADMISSION:  09/10/2007  DATE OF DISCHARGE:  09/21/2007                         DISCHARGE SUMMARY - REFERRING   DISCHARGE DIAGNOSES:  1. Pneumonia ([place] ) .  2. Pericardial effusion.  3. Pleural effusion status post thoracentesis.  4. Nonobstructive coronary artery disease with preserved left      ventricular function.  5. History of diabetes.  6. History of hypertension, controlled.  7. Hyperlipidemia.  8. Hypotension.  9. Iron deficiency anemia.   History as noted below.   PROCEDURES PERFORMED:  1. Status post thoracentesis by interventional radiology on Sep 17, 2007.  2. Right and left cardiac catheterization by Dr. Excell Seltzer on Sep 15, 2007.   SUMMARY OF HISTORY:  Ellen Woods is a 70 year old female who was  admitted by Dr. Doyne Keel to [facility] with progressive exertional  dyspnea on Sep 08, 2007.  Dr. Andee Lineman was asked to consult on Sep 10, 2007.  She described a 25-month history of progressive dyspnea on exertion.  However, her symptoms had worsened in the last several weeks.  In fact,  she stated that she could hardly do anything in the last week.  She was  initially evaluated by Paulita Cradle, nurse practitioner at [facility] and was felt to have bronchitis, and she  received antibiotic therapy.  However, symptoms did not improve.  She  also received fluid medications without improvement.  On the night prior  to admission, the patient developed orthopnea, PND, edema.  On admission  chest x-ray at [facility], she was found to have bilateral small pleural  effusions, elevated D-dimer, but with a negative CT for pulmonary  embolism.  Dr. Doyne Keel was concerned about the  possibility of  constrictive pericarditis.  However, BNP was only 156, and Dr. Andee Lineman  was consulted.   PAST MEDICAL HISTORY:  1. Hypertension.  2. Type 2 diabetes.  3. Osteoporosis.   Dr. Andee Lineman, after evaluating the patient, felt that she should be  transferred to [facility] for further evaluation.  He contacted Dr. Maurice March also  to assist ruling out active tuberculosis and the cardiology team to rule  out constrictive pericarditis or effusive constricted physiology.   LABORATORY:  On transfer to [facility], the patient weighed 53.5 kg.  At the  time of discharge on the 19th the patient weighed 53.8 kg.  On Sep 11, 2007, her H&H at [facility] was 9.9 and 30.0, normal indices,  platelets 380, WBCs 8.2.  Prior to discharge on the 19th, H&H was 9.3  and 27.2, normal indices, platelets 496, WBCs 7.2.  ESR on the 8th was  elevated at 84.  PTT on the 10th was 32, PT 14.8.  On the 9th, sodium  was 132, potassium 4.2, BUN 21, creatinine 0.94, glucose 139.  Alkaline  phosphatase, AST and ALT were elevated  at 229, 28, and 47, respectively.  Albumin was low at 2.9.  By the 12th, her LFTs had improved.  However,  her alkaline phosphatase remained elevated at 149.  By the 19th, sodium  was 141, potassium 43, BUN 15, creatinine 1.17, glucose 136.  Her  elevated LFTs had resolved.  Her total protein, albumin, and calcium  were low at 5.6, 2.4, and 8.3, respectively.  Her LDH was elevated at  260 on the 15th.  The angiotensin-converting enzyme on the 9th was 10.  Her hemoglobin A1c was 7.2 on the 14th.  Fasting lipids on the 9th  showed a total cholesterol of 131, triglycerides 84, HDL 41, LDL 73.  TSH was 2.899.  On the 8th, iron was low at 10, TIBC low at 238, percent  sat low at 4, ferritin 260.  HIV on the 9th was nonreactive.  Urinalysis  on the 8th was positive for 40 mg/dl of protein, small amount of  leukocytes, few squamous epitheliums.  RA was less than 20.  Antinuclear  antibody was negative x2.   Stools were heme-negative x2.  Histoplasma  antigen and Histoplasma AG urine interpretation were both normal.  Blood  cultures on the 9th were negative for 5 days.  Urine culture on the 8th  showed insignificant growth.  Fluid cultures, respiratory, essentially  unremarkable.  However, it was noticed sputum did cultures consistent  with yeast that were abundant.  Pleural fluid did not show any growth or  acid-fast bacilli, and pleural fluid was notable for LDL being slightly  elevated at 112, and monocyte macrophage was low at 15.   Chest x-ray on the 9th showed left lower lobe consolidation/atelectasis  with air bronchogram suggesting pneumonia and moderate left and right  pleural effusions.  Chest x-ray on the 11th showed increased left lower  lobe airspace disease.  CT of the chest on the 14th revealed pericardial  effusion with calcification of the pericardium. Constrictive  pericarditis could not be excluded.  When compared to films from  [place], the pericardial effusion has decreased slightly as well as the  pleural effusions.  Thoracentesis performed on the 15th by Jeananne Rama,  P.A.-C.  Last chest x-ray on the 18th showed improved aeration of the  left lung base consistent with interval decrease in the left basilar  atelectasis and/or consolidation, atelectasis in the medial right lung  base, small bilateral pleural effusions, and pericardial calcifications.   HOSPITAL COURSE:  Ellen Woods was transferred from [facility] to  5500.  Just prior to transfer, an echocardiogram had been performed at  [facility] and showed an EF of 55-60%, normal contractility, moderate  pericardial effusion without RV diastolic collapse, and there was no  evidence of tamponade physiology.  Infectious disease was notified of  her admission, and consultation was obtained with Dr. Sampson Goon who  also agreed with right and left heart catheterization to evaluate  hemodynamics and for further  evaluation for possible infection.  They  also felt that she would possibly need pericardiocentesis plus or minus  biopsy.  She remained mildly short of breath and with a slight  temperature.   Various labs, as dictated above, were obtained.  PPD was negative.  Nursing reported that she had chest discomfort on the evening of the  10th that was pleuritic in nature.  She continued to run a slight fever.  Dr. Gala Romney noted a decrease in her cough, but her Kussmaul sign  remained positive.  She remained on isolation until her  AFBs returned  negative.  Diabetes treatment team assisted with overseeing her glycemic  management.  On the morning of the 13th, she still continued to have a  slight temperature of 100.3; however, it was felt that she could  continue with cardiac catheterization.  Infectious disease had removed  isolation protocols.  Dr. Excell Seltzer performed cardiac catheterization on  Sep 15, 2007.  According to the report, the patient had a hyperdynamic  LV function, 60% proximal LAD, 50% mid LAD, 40% proximal circumflex with  a nondominant RCA.  There was equalization of diastolic pressures  without other classic hemodynamic findings of constriction.  She had  elevated right heart pressures without significant pulmonary  hypertension.  It is noted at the time of this dictation that there is  no procedure or progress note within the body of the chart.  He  commented that [facility] had obtained culture, and they had contacted  [facility], and their micro lab did isolate AGNR from her sputum, and it  was being sent out for sensitivities.  ID suspected she had a AGNR  pneumonia, possibly related to aspiration during colonoscopy.  They also  felt she had no evidence of bacteremia or endocarditis.  Chest CT was  performed without contrast to compare pneumonia and effusions to  [facility] as well as a pulmonary consult.  She remained on antibiotics.  Pulmonary consult was obtained on the 14th  and agreed with  thoracentesis.  Ultrasound-guided thoracentesis was performed by  interventional radiology on Sep 17, 2007.  Results of fluid were  obtained and were noted as previously dictated above.  Pulmonary felt by  the 16th she was doing clinically better.  She no longer had egophony.  They felt that her fluid appears to be transudative and that no  organisms were available.  Her lymphocyte count was not high enough to  be lymphoma.  Protein count was less than 3, LDH was low.  It was felt  that the cell count and differential was nonspecific.  With her  transudative effusion, bilateral in nature, increased PWP at right heart  cath, and equalization of diastolic pressures, pulmonary remained  suspicious for cardiac source to her effusions.  Cardiology felt that  pericardial effusion was not an issue and could consider VATS with a  pleural pericardial biopsies.  By the 18th, the patient stated that she  felt much better, although weak.  She is significantly improved.  Discharge planning was initiated.  The patient was complaining of some  orthostatic dizziness, thus blood pressures were obtained.  She did have  a slight drop from lying to standing as evidenced by a blood pressure of  114/57 lying and standing 105/58.  Pulse remained unchanged.  ID and  pulmonary also agreed that the patient could potentially be discharged  on 19th with continued antibiotics and early followup.  She remained  afebrile, and after review with Dr. Jens Som on Sep 21, 2007, it was  felt that she could be discharged home.   DISPOSITION:  The patient is discharged home on Sep 21, 2007.  She is  asked to maintain a low-sodium, heart-healthy ADA diet.  Her activity is  not restricted, but she was asked to increase her activities slowly.  She received a supplemental sheet and post catheterization site care.   NEW MEDICATIONS:  1. Avelox 400 mg daily for an additional 7 days.  2. Her Lasix was increased to  40 mg daily.  3. She received a new prescription for potassium  20 mEq daily.  4. She was asked to begin aspirin 81 mg daily.  5. Increase her Lipitor to 20 mg q.h.s.  6. She was asked to continue her Lantus 17 units daily.  7. NovoLog sliding scale coverage.  8. Centrum Silver as previously.  9. Xanax 0.5 mg t.i.d. as needed.  10.She was also asked to begin iron supplementation 325 mg 2 times a      day.   She will have an echocardiogram in approximately 10 days at [facility], carotid Doppler's at the same time to follow up on carotid  bruits.  She will have a BMET in approximately 7-10 days and will need  to FLP and LFTs in 6-8 weeks since her Lipitor was increased.  She was  asked to follow up with Paulita Cradle, family nurse practitioner.  To  call for a 1-week appointment, as she will need a chest x-ray on  followup of her recent pneumonia.  Dr. Andee Lineman will see her on October 14, 2007 at 1:30 p.m. in the [place] office and follow up on a BMET,  echocardiogram, and carotid Dopplers.  She was advised to bring all  medications to all appointments.  She was also asked not to take her  lisinopril.   DISCHARGE TIME:  50 minutes.      Joellyn Rued, PA-C      Madolyn Frieze Jens Som, MD, [facility]  Electronically Signed    EW/MEDQ  D:  09/21/2007  T:  09/21/2007  Job:  [id number]   cc:   Paulita Cradle, FNP  Learta Codding, MD,FACC  Barbaraann Share, MD,FCCP

## 2011-01-29 LAB — CBC
HCT: 31.5 — ABNORMAL LOW
MCHC: 33.1
MCV: 90.4
Platelets: 496 — ABNORMAL HIGH
RBC: 3.48 — ABNORMAL LOW
RDW: 14.2
WBC: 5.8
WBC: 7.2

## 2011-01-29 LAB — LACTATE DEHYDROGENASE: LDH: 260 — ABNORMAL HIGH

## 2011-01-29 LAB — COMPREHENSIVE METABOLIC PANEL
AST: 18
AST: 24
Albumin: 2.4 — ABNORMAL LOW
Alkaline Phosphatase: 98
BUN: 19
CO2: 26
Calcium: 8.8
Chloride: 104
Chloride: 104
Creatinine, Ser: 0.97
Creatinine, Ser: 1.17
GFR calc Af Amer: 56 — ABNORMAL LOW
GFR calc Af Amer: >60
GFR calc non Af Amer: 57 — ABNORMAL LOW
Potassium: 4.3
Total Bilirubin: 0.6
Total Bilirubin: 0.6
Total Protein: 5.6 — ABNORMAL LOW

## 2011-01-29 LAB — AFB CULTURE WITH SMEAR (NOT AT ARMC)

## 2011-01-29 LAB — B-NATRIURETIC PEPTIDE (CONVERTED LAB): Pro B Natriuretic peptide (BNP): 374 — ABNORMAL HIGH

## 2011-01-29 LAB — BASIC METABOLIC PANEL
BUN: 10
BUN: 13
BUN: 15
Calcium: 7.5 — ABNORMAL LOW
Calcium: 7.8 — ABNORMAL LOW
Creatinine, Ser: 1.02
Creatinine, Ser: 1.14
Creatinine, Ser: 1.19
GFR calc non Af Amer: 45 — ABNORMAL LOW
GFR calc non Af Amer: 48 — ABNORMAL LOW
GFR calc non Af Amer: 54 — ABNORMAL LOW
Glucose, Bld: 190 — ABNORMAL HIGH
Glucose, Bld: 209 — ABNORMAL HIGH
Potassium: 3.6
Sodium: 137

## 2011-01-29 LAB — URINALYSIS, ROUTINE W REFLEX MICROSCOPIC
Glucose, UA: NEGATIVE
Ketones, ur: 15 — AB
Nitrite: NEGATIVE
Specific Gravity, Urine: 1.022
pH: 5

## 2011-01-29 LAB — DIFFERENTIAL
Basophils Absolute: 0.1
Lymphocytes Relative: 23
Lymphs Abs: 1.3
Neutro Abs: 3.7
Neutrophils Relative %: 64

## 2011-01-29 LAB — URINE MICROSCOPIC-ADD ON

## 2011-01-29 LAB — GLUCOSE, SEROUS FLUID: Glucose, Fluid: 111

## 2011-01-29 LAB — HEPATIC FUNCTION PANEL
Alkaline Phosphatase: 140 — ABNORMAL HIGH
Indirect Bilirubin: 0.4
Total Bilirubin: 0.7

## 2011-01-29 LAB — BODY FLUID CELL COUNT WITH DIFFERENTIAL
Eos, Fluid: 0
Neutrophil Count, Fluid: 12

## 2011-01-29 LAB — CHOLESTEROL, BODY FLUID: Cholesterol, Fluid: 39

## 2011-01-29 LAB — AMYLASE, BODY FLUID: Amylase, Fluid: <5

## 2011-01-29 LAB — TRIGLYCERIDES, BODY FLUIDS

## 2011-01-29 LAB — BODY FLUID CULTURE

## 2011-06-06 ENCOUNTER — Encounter: Payer: Self-pay | Admitting: Cardiology

## 2011-06-09 ENCOUNTER — Ambulatory Visit (INDEPENDENT_AMBULATORY_CARE_PROVIDER_SITE_OTHER): Payer: 59 | Admitting: Cardiology

## 2011-06-09 ENCOUNTER — Other Ambulatory Visit: Payer: Self-pay | Admitting: Physician Assistant

## 2011-06-09 ENCOUNTER — Encounter: Payer: Self-pay | Admitting: Cardiology

## 2011-06-09 VITALS — BP 167/87 | HR 63 | Ht 59.0 in | Wt 130.0 lb

## 2011-06-09 DIAGNOSIS — I4891 Unspecified atrial fibrillation: Secondary | ICD-10-CM

## 2011-06-09 DIAGNOSIS — I2510 Atherosclerotic heart disease of native coronary artery without angina pectoris: Secondary | ICD-10-CM

## 2011-06-09 DIAGNOSIS — I318 Other specified diseases of pericardium: Secondary | ICD-10-CM

## 2011-06-09 DIAGNOSIS — Q250 Patent ductus arteriosus: Secondary | ICD-10-CM

## 2011-06-09 DIAGNOSIS — I25119 Atherosclerotic heart disease of native coronary artery with unspecified angina pectoris: Secondary | ICD-10-CM | POA: Insufficient documentation

## 2011-06-09 NOTE — Patient Instructions (Signed)
   48 hour holter monitor If the results of your test are normal or stable, you will receive a letter.  If they are abnormal, the nurse will contact you by phone. Your physician wants you to follow up in: 6 months.  You will receive a reminder letter in the mail one-two months in advance.  If you don't receive a letter, please call our office to schedule the follow up appointment  

## 2011-06-09 NOTE — Progress Notes (Signed)
Ellen Bottoms, MD, [facility] ABIM Board Certified in Adult Cardiovascular Medicine,Internal Medicine and Critical Care Medicine    CC: Follow patient with a history of dyspnea  HPI: The patient is a 71 year old female history of small PDA, pericardial calcification, nonobstructive coronary disease and previously ruled out for constrictive pericarditis. The patient reports no chest pain or shortness of breath at rest. She has some decrease in exercise tolerance and is in NYHA class II. This is mainly related to deconditioning. During her cardiac catheterization there was no evidence of elevated right heart pressures. She reports occasional palpitations and interestingly on her electrocardiogram today she has a brief run of atrial fibrillation. She complains of some low back pain.   PMH: reviewed and listed in Problem List in Electronic Records (and see below) Past Medical History  Diagnosis Date  . Other dyspnea and respiratory abnormality     Resolved  . Unspecified essential hypertension   . Type II or unspecified type diabetes mellitus without mention of complication, not stated as uncontrolled   . Coronary atherosclerosis of native coronary artery     50% LAD stenosis  . Encounter for long-term (current) use of insulin   . Other symptoms involving cardiovascular system   . Pain in joint, pelvic region and thigh   . Shortness of breath   . Other chest pain     Atypical chest pain  . PDA (patent ductus arteriosus)     Small PDA documented by CT angiography, no pulmonary hypertension  . Pericardial calcification     Ruled out for restrictive pericarditis   Past Surgical History  Procedure Date  . Incisional hernia repair   . Abdominal hysterectomy   . Tubal ligation     Allergies/SH/FHX : available in Electronic Records for review  Allergies  Allergen Reactions  . Azithromycin   . Codeine     REACTION: nausea   History   Social History  . Marital Status: Widowed   Spouse Name: N/A    Number of Children: 2  . Years of Education: N/A   Occupational History  . Not on file.   Social History Main Topics  . Smoking status: Never Smoker   . Smokeless tobacco: Never Used  . Alcohol Use: No  . Drug Use: No  . Sexually Active: Not on file   Other Topics Concern  . Not on file   Social History Narrative  . No narrative on file   Family History  Problem Relation Age of Onset  . Stroke Mother     Medications: Current Outpatient Prescriptions  Medication Sig Dispense Refill  . ALPRAZolam (XANAX) 0.5 MG tablet Take 0.5 mg by mouth 2 (two) times daily.       . furosemide (LASIX) 20 MG tablet Take 20 mg by mouth daily.      Marland Kitchen gabapentin (NEURONTIN) 300 MG capsule Take 300 mg by mouth 3 (three) times daily.      . insulin glargine (LANTUS) 100 UNIT/ML injection Inject 26 Units into the skin at bedtime.       Marland Kitchen levothyroxine (SYNTHROID, LEVOTHROID) 50 MCG tablet Take 50 mcg by mouth daily.      Marland Kitchen lisinopril (PRINIVIL,ZESTRIL) 5 MG tablet Take 5 mg by mouth daily.      . metoprolol tartrate (LOPRESSOR) 25 MG tablet Take 37.5 mg by mouth 2 (two) times daily.      . Multiple Vitamin (MULTIVITAMIN) tablet Take 1 tablet by mouth daily.      Marland Kitchen  nitroGLYCERIN (NITROSTAT) 0.4 MG SL tablet Place 0.4 mg under the tongue every 5 (five) minutes as needed.      . potassium chloride SA (K-DUR,KLOR-CON) 20 MEQ tablet Take 20 mEq by mouth daily.      . simvastatin (ZOCOR) 40 MG tablet Take 40 mg by mouth every evening.        ROS: No nausea or vomiting. No fever or chills.No melena or hematochezia.No bleeding.No claudication. Palpitations  Physical Exam: BP 167/87  Pulse 63  Ht 4\' 11"  (1.499 m)  Wt 130 lb (58.968 kg)  BMI 26.26 kg/m2  SpO2 100% General: Well-nourished white female in no distress. Neck: Normal carotid upstroke no carotid bruits. No thyromegaly nonnodular thyroid. JVP is 5 cm Lungs: Clear breath sounds bilaterally without wheezing Cardiac:  Regular rate and rhythm, normal S1-S2, occasional extrasystoles no significant pathological murmurs Vascular: No edema. Normal distal pulses bilaterally Skin: Warm and dry Physcologic: Normal affect  12lead ECG: Normal sinus rhythm with brief run of atrial fibrillation Limited bedside ECHO:N/A   Patient Active Problem List  Diagnoses  . DM  . HYPERLIPIDEMIA  . ESSENTIAL HYPERTENSION, BENIGN  . RECTAL BLEEDING  . DYSPNEA-secondary to deconditioning   . CHEST PAIN, PRECORDIAL  . DYSPHAGIA  . CHANGE IN BOWELS  . Pericardial calcification-no evidence of constrictive pericarditis   . PDA (patent ductus arteriosus)  . Coronary atherosclerosis of native coronary artery Rule out atrial fibrillation, brief runs on electrocardiogram today  CHADS-Vasc=5    PLAN   From a cardiovascular standpoint the patient appears to be stable she reports no chest pain or shortness of breath.  She did have a brief run of atrial fibrillation on her electrocardiogram today and does report occasional palpitations.  I have ordered a 48 hour Holter monitor and if there is evidence of proximal atrial fibrillation the patient will need to be started on anticoagulation given her high risk for thromboembolic disease with a very high CHADS-Vasc score.

## 2011-06-10 ENCOUNTER — Other Ambulatory Visit: Payer: Self-pay | Admitting: *Deleted

## 2011-06-10 DIAGNOSIS — I4891 Unspecified atrial fibrillation: Secondary | ICD-10-CM

## 2011-06-11 ENCOUNTER — Ambulatory Visit: Payer: Self-pay | Admitting: Cardiology

## 2011-06-18 ENCOUNTER — Encounter (INDEPENDENT_AMBULATORY_CARE_PROVIDER_SITE_OTHER): Payer: 59 | Admitting: *Deleted

## 2011-06-18 DIAGNOSIS — I4891 Unspecified atrial fibrillation: Secondary | ICD-10-CM

## 2011-07-14 ENCOUNTER — Encounter: Payer: Self-pay | Admitting: *Deleted

## 2011-07-15 ENCOUNTER — Other Ambulatory Visit: Payer: Self-pay | Admitting: Cardiology

## 2012-03-15 ENCOUNTER — Encounter: Payer: Self-pay | Admitting: Physician Assistant

## 2012-03-15 ENCOUNTER — Ambulatory Visit (INDEPENDENT_AMBULATORY_CARE_PROVIDER_SITE_OTHER): Payer: 59 | Admitting: Physician Assistant

## 2012-03-15 VITALS — BP 133/73 | HR 72 | Ht 59.0 in | Wt 128.4 lb

## 2012-03-15 DIAGNOSIS — E785 Hyperlipidemia, unspecified: Secondary | ICD-10-CM

## 2012-03-15 DIAGNOSIS — R0989 Other specified symptoms and signs involving the circulatory and respiratory systems: Secondary | ICD-10-CM

## 2012-03-15 DIAGNOSIS — I4891 Unspecified atrial fibrillation: Secondary | ICD-10-CM

## 2012-03-15 DIAGNOSIS — I480 Paroxysmal atrial fibrillation: Secondary | ICD-10-CM

## 2012-03-15 DIAGNOSIS — E119 Type 2 diabetes mellitus without complications: Secondary | ICD-10-CM

## 2012-03-15 DIAGNOSIS — I10 Essential (primary) hypertension: Secondary | ICD-10-CM

## 2012-03-15 MED ORDER — ASPIRIN EC 325 MG PO TBEC: 325.0000 mg | DELAYED_RELEASE_TABLET | Freq: Every day | ORAL | Status: DC

## 2012-03-15 NOTE — Progress Notes (Signed)
Primary Cardiologist: Lewayne Bunting, MD   HPI: Patient presents for overdue six-month followup, last seen in clinic in February 2013, by Dr. Andee Lineman.  When last seen, she was referred for a 48 hour Holter monitor to rule out recurrent AF. This proved negative for dysrhythmia. Of note, Dr. Andee Lineman had indicated that had there been definite evidence of such, that she would need to be started on anticoagulation, given her very high CHADSVASC score of 5.  Since her last OV, patient denies any tachycardia palpitations. She denies any interim development of exertional CP or significant dyspnea.  Allergies  Allergen Reactions  . Azithromycin   . Codeine     REACTION: nausea    Current Outpatient Prescriptions  Medication Sig Dispense Refill  . alendronate (FOSAMAX) 70 MG tablet Take 70 mg by mouth every 7 (seven) days. Take with a full glass of water on an empty stomach.      . ALPRAZolam (XANAX) 0.5 MG tablet Take 0.5 mg by mouth 2 (two) times daily.       Marland Kitchen aspirin 81 MG tablet Take 81 mg by mouth daily.      . Chromium Picolinate 500 MCG TABS Take 1 tablet by mouth daily.      . furosemide (LASIX) 20 MG tablet Take 20 mg by mouth daily.      . insulin glargine (LANTUS) 100 UNIT/ML injection Inject 20 Units into the skin every morning.       Marland Kitchen levothyroxine (SYNTHROID, LEVOTHROID) 50 MCG tablet Take 50 mcg by mouth daily.      Marland Kitchen lisinopril (PRINIVIL,ZESTRIL) 5 MG tablet Take 5 mg by mouth daily.      . metoprolol tartrate (LOPRESSOR) 25 MG tablet       . Multiple Vitamin (MULTIVITAMIN) tablet Take 1 tablet by mouth daily.      . potassium chloride SA (K-DUR,KLOR-CON) 20 MEQ tablet Take 20 mEq by mouth daily.      . simvastatin (ZOCOR) 40 MG tablet Take 40 mg by mouth every evening.      . vitamin B-12 (CYANOCOBALAMIN) 1000 MCG tablet Take 1,000 mcg by mouth daily.      . [DISCONTINUED] metoprolol tartrate (LOPRESSOR) 25 MG tablet TAKE ONE AND ONE-HALF TABLETS BY MOUTH TWICE DAILY  90 tablet  6    . NITROSTAT 0.4 MG SL tablet DISSOLVE ONE TABLET UNDER THE TONGUE EVERY 5 MINUTES AS NEEDED FOR CHEST PAIN.  DO NOT EXCEED A TOTAL OF 3 DOSES IN 15 MINUTES  30 each  12    Past Medical History  Diagnosis Date  . Other dyspnea and respiratory abnormality     Resolved  . Unspecified essential hypertension   . Type II or unspecified type diabetes mellitus without mention of complication, not stated as uncontrolled   . Coronary atherosclerosis of native coronary artery     50% LAD stenosis  . Encounter for long-term (current) use of insulin   . Other symptoms involving cardiovascular system   . Pain in joint, pelvic region and thigh   . Shortness of breath   . Other chest pain     Atypical chest pain  . PDA (patent ductus arteriosus)     Small PDA documented by CT angiography, no pulmonary hypertension  . Pericardial calcification     Ruled out for restrictive pericarditis    Past Surgical History  Procedure Date  . Incisional hernia repair   . Abdominal hysterectomy   . Tubal ligation     History  Social History  . Marital Status: Widowed    Spouse Name: N/A    Number of Children: 2  . Years of Education: N/A   Occupational History  . Not on file.   Social History Main Topics  . Smoking status: Never Smoker   . Smokeless tobacco: Never Used  . Alcohol Use: No  . Drug Use: No  . Sexually Active: Not on file   Other Topics Concern  . Not on file   Social History Narrative  . No narrative on file    Family History  Problem Relation Age of Onset  . Stroke Mother     ROS: no nausea, vomiting; no fever, chills; no melena, hematochezia; no claudication  PHYSICAL EXAM: BP 133/73  Pulse 72  Ht 4\' 11"  (1.499 m)  Wt 128 lb 6.4 oz (58.242 kg)  BMI 25.93 kg/m2  SpO2 99% GENERAL: 71 year-old female; NAD HEENT: NCAT, PERRLA, EOMI; sclera clear; no xanthelasma NECK: palpable bilateral carotid pulses, no bruits; no JVD; no TM LUNGS: CTA bilaterally CARDIAC: RRR  (S1, S2); no significant murmurs; no rubs or gallops ABDOMEN: soft, non-tender; intact BS EXTREMETIES: intact distal pulses; no significant peripheral edema SKIN: warm/dry; no obvious rash/lesions MUSCULOSKELETAL: no joint deformity NEURO: no focal deficit; NL affect   EKG:    ASSESSMENT & PLAN:  PAF (paroxysmal atrial fibrillation)  Patient remains in NSR by history and exam. She had a negative 48 hour Holter monitor, when last seen in February. Therefore, recommending continuing current medication regimen, but will increase ASA to 325 daily. I will defer to Dr. Andee Lineman, with whom patient prefers to continue her care, regarding the question of whether or not to initiate long-term anticoagulation, in light of elevated CHADSVASC score and previously documented transient PAF.  Carotid bruit Will order carotid Dopplers to rule out significant ICA disease. Patient had less than 50% bilateral ICA stenosis, in 2009.  ESSENTIAL HYPERTENSION, BENIGN Stable on current medication regimen  DM Followed by primary M.D.  HYPERLIPIDEMIA Followed by primary M.D. Recommended target LDL 100 or less, if feasible.    Gene Aritha Huckeba, PAC

## 2012-03-15 NOTE — Patient Instructions (Addendum)
   Carotid Dopplers  Office will contact with results  Increase Aspirin to 325mg  daily Continue all current medications. Your physician wants you to follow up in: 6 months.  You will receive a reminder letter in the mail one-two months in advance.  If you don't receive a letter, please call our office to schedule the follow up appointment

## 2012-03-15 NOTE — Assessment & Plan Note (Signed)
Followed by primary M.D. Recommended target LDL 100 or less, if feasible. 

## 2012-03-15 NOTE — Assessment & Plan Note (Signed)
Patient remains in NSR by history and exam. She had a negative 48 hour Holter monitor, when last seen in February. Therefore, recommending continuing current medication regimen, but will increase ASA to 325 daily. I will defer to Dr. Andee Lineman, with whom patient prefers to continue her care, regarding the question of whether or not to initiate long-term anticoagulation, in light of elevated CHADSVASC score and previously documented transient PAF.

## 2012-03-15 NOTE — Assessment & Plan Note (Signed)
Followed by primary M.D. 

## 2012-03-15 NOTE — Assessment & Plan Note (Signed)
Will order carotid Dopplers to rule out significant ICA disease. Patient had less than 50% bilateral ICA stenosis, in 2009.

## 2012-03-15 NOTE — Assessment & Plan Note (Signed)
Stable on current medication regimen 

## 2012-03-18 ENCOUNTER — Encounter (INDEPENDENT_AMBULATORY_CARE_PROVIDER_SITE_OTHER): Payer: 59

## 2012-03-18 DIAGNOSIS — I6529 Occlusion and stenosis of unspecified carotid artery: Secondary | ICD-10-CM

## 2012-03-18 DIAGNOSIS — R0989 Other specified symptoms and signs involving the circulatory and respiratory systems: Secondary | ICD-10-CM

## 2012-03-23 ENCOUNTER — Encounter: Payer: Self-pay | Admitting: *Deleted

## 2012-05-05 DIAGNOSIS — M549 Dorsalgia, unspecified: Secondary | ICD-10-CM

## 2012-05-05 HISTORY — DX: Dorsalgia, unspecified: M54.9

## 2012-07-23 ENCOUNTER — Other Ambulatory Visit (INDEPENDENT_AMBULATORY_CARE_PROVIDER_SITE_OTHER): Payer: Medicare PPO

## 2012-07-23 DIAGNOSIS — Z1212 Encounter for screening for malignant neoplasm of rectum: Secondary | ICD-10-CM

## 2012-07-26 ENCOUNTER — Telehealth: Payer: Self-pay | Admitting: Family Medicine

## 2012-07-26 NOTE — Telephone Encounter (Signed)
No answer- not able to leave message.

## 2012-08-23 ENCOUNTER — Encounter: Payer: Self-pay | Admitting: *Deleted

## 2012-09-01 ENCOUNTER — Ambulatory Visit (INDEPENDENT_AMBULATORY_CARE_PROVIDER_SITE_OTHER): Payer: Medicare PPO | Admitting: General Practice

## 2012-09-01 ENCOUNTER — Encounter: Payer: Self-pay | Admitting: General Practice

## 2012-09-01 VITALS — BP 129/61 | HR 61 | Temp 97.0°F | Ht 59.0 in | Wt 123.0 lb

## 2012-09-01 DIAGNOSIS — J309 Allergic rhinitis, unspecified: Secondary | ICD-10-CM

## 2012-09-01 MED ORDER — CETIRIZINE HCL 10 MG PO TABS: 10.0000 mg | ORAL_TABLET | Freq: Every day | ORAL | Status: DC

## 2012-09-01 MED ORDER — FLUTICASONE PROPIONATE 50 MCG/ACT NA SUSP: 2.0000 | Freq: Every day | NASAL | Status: DC

## 2012-09-01 NOTE — Progress Notes (Signed)
  Subjective:    Patient ID: Ellen Woods, female    DOB: 1941-05-03, 72 y.o.   MRN: [id number]  HPI Presents today with runny nose, watery and itchy eyes and sneezing. Reports symptoms are about the same all the time. Reports using claritin with minimal relief.     Review of Systems  Constitutional: Negative for fever and chills.  HENT: Positive for rhinorrhea and sneezing. Negative for nosebleeds and congestion.        At times nasal congestion   Respiratory: Negative for chest tightness and shortness of breath.   Genitourinary: Negative for difficulty urinating.  Skin: Negative.   Neurological: Negative for dizziness and headaches.  Psychiatric/Behavioral: Negative.        Objective:   Physical Exam  Constitutional: She appears well-developed and well-nourished.  HENT:  Nose: Rhinorrhea present. Right sinus exhibits no maxillary sinus tenderness and no frontal sinus tenderness. Left sinus exhibits no maxillary sinus tenderness and no frontal sinus tenderness.  Mouth/Throat: Posterior oropharyngeal erythema present.  Pulmonary/Chest: Effort normal and breath sounds normal. No respiratory distress. She exhibits no tenderness.          Assessment & Plan:  Take medications as prescribed Avoid allergens RTO if symptoms worsen Patient verbalized understanding Raymon Mutton, FNP-C

## 2012-09-01 NOTE — Patient Instructions (Addendum)
Allergic Rhinitis  Allergic rhinitis is when the mucous membranes in the nose respond to allergens. Allergens are particles in the air that cause your body to have an allergic reaction. This causes you to release allergic antibodies. Through a chain of events, these eventually cause you to release histamine into the blood stream (hence the use of antihistamines). Although meant to be protective to the body, it is this release that causes your discomfort, such as frequent sneezing, congestion and an itchy runny nose.    CAUSES    The pollen allergens may come from grasses, trees, and weeds. This is seasonal allergic rhinitis, or "hay fever." Other allergens cause year-round allergic rhinitis (perennial allergic rhinitis) such as house dust mite allergen, pet dander and mold spores.    SYMPTOMS     Nasal stuffiness (congestion).   Runny, itchy nose with sneezing and tearing of the eyes.   There is often an itching of the mouth, eyes and ears.  It cannot be cured, but it can be controlled with medications.  DIAGNOSIS    If you are unable to determine the offending allergen, skin or blood testing may find it.  TREATMENT     Avoid the allergen.   Medications and allergy shots (immunotherapy) can help.   Hay fever may often be treated with antihistamines in pill or nasal spray forms. Antihistamines block the effects of histamine. There are over-the-counter medicines that may help with nasal congestion and swelling around the eyes. Check with your caregiver before taking or giving this medicine.  If the treatment above does not work, there are many new medications your caregiver can prescribe. Stronger medications may be used if initial measures are ineffective. Desensitizing injections can be used if medications and avoidance fails. Desensitization is when a patient is given ongoing shots until the body becomes less sensitive to the allergen. Make sure you follow up with your caregiver if problems continue.   SEEK MEDICAL CARE IF:     You develop fever (more than 100.5 F (38.1 C).   You develop a cough that does not stop easily (persistent).   You have shortness of breath.   You start wheezing.   Symptoms interfere with normal daily activities.  Document Released: 01/14/2001 Document Revised: 07/14/2011 Document Reviewed: 07/26/2008  Exit[employer] Patient Information 2013 [employer].

## 2012-09-08 ENCOUNTER — Encounter: Payer: Self-pay | Admitting: Nurse Practitioner

## 2012-09-08 ENCOUNTER — Ambulatory Visit (INDEPENDENT_AMBULATORY_CARE_PROVIDER_SITE_OTHER): Payer: Medicare PPO | Admitting: Nurse Practitioner

## 2012-09-08 VITALS — BP 118/63 | HR 69 | Temp 98.2°F | Ht 59.0 in | Wt 123.0 lb

## 2012-09-08 DIAGNOSIS — E039 Hypothyroidism, unspecified: Secondary | ICD-10-CM

## 2012-09-08 DIAGNOSIS — E876 Hypokalemia: Secondary | ICD-10-CM

## 2012-09-08 DIAGNOSIS — I10 Essential (primary) hypertension: Secondary | ICD-10-CM

## 2012-09-08 DIAGNOSIS — J309 Allergic rhinitis, unspecified: Secondary | ICD-10-CM

## 2012-09-08 DIAGNOSIS — E785 Hyperlipidemia, unspecified: Secondary | ICD-10-CM

## 2012-09-08 MED ORDER — NITROSTAT 0.4 MG SL SUBL: 0.4000 mg | SUBLINGUAL_TABLET | SUBLINGUAL | Status: DC | PRN

## 2012-09-08 NOTE — Progress Notes (Signed)
Subjective:    Patient ID: Ellen Woods, female    DOB: [birth year], 72 y.o.   MRN: [id number]  Hypertension This is a chronic problem. The current episode started more than 1 year ago. The problem is unchanged. The problem is controlled. Pertinent negatives include no blurred vision, chest pain, headaches, peripheral edema or shortness of breath. Agents associated with hypertension include thyroid hormones. Risk factors for coronary artery disease include diabetes mellitus, dyslipidemia and post-menopausal state. Past treatments include ACE inhibitors, diuretics and beta blockers. The current treatment provides significant improvement. Compliance problems include diet and exercise.  Hypertensive end-organ damage includes a thyroid problem.  Hyperlipidemia This is a chronic problem. The current episode started more than 1 year ago. The problem is controlled. Recent lipid tests were reviewed and are normal. Exacerbating diseases include diabetes and hypothyroidism. There are no known factors aggravating her hyperlipidemia. Pertinent negatives include no chest pain, focal weakness, leg pain, myalgias or shortness of breath. Current antihyperlipidemic treatment includes statins. The current treatment provides moderate improvement of lipids. Compliance problems include adherence to diet and adherence to exercise.  Risk factors for coronary artery disease include diabetes mellitus, hypertension and post-menopausal.  Diabetes She presents for her follow-up diabetic visit. She has type 2 diabetes mellitus. No MedicAlert identification noted. The initial diagnosis of diabetes was made 40 years ago. Her disease course has been fluctuating. Pertinent negatives for hypoglycemia include no headaches, mood changes, seizures or sleepiness. Associated symptoms include foot paresthesias. Pertinent negatives for diabetes include no blurred vision, no chest pain, no polydipsia, no polyphagia, no polyuria and no visual  change. Symptoms are stable. Diabetic complications include heart disease. Risk factors for coronary artery disease include post-menopausal, hypertension and dyslipidemia. Current diabetic treatment includes insulin injections. She is compliant with treatment all of the time. She is currently taking insulin at bedtime, pre-dinner, pre-lunch and pre-breakfast (Normally has to take 4-5u of humulin prior to meals.). Insulin injections are given by patient. Rotation sites for injection include the abdominal wall and lower legs. Her weight is stable. When asked about meal planning, she reported none. She has not had a previous visit with a dietician. She never participates in exercise. Her breakfast blood glucose range is generally 130-140 mg/dl. Her overall blood glucose range is 130-140 mg/dl. An ACE inhibitor/angiotensin II receptor blocker is being taken. She does not see a podiatrist.Eye exam is current (last year).  Hypokalemia No c/o lower extremity cramping Hypothyroidism Taking levothyroxine without complications- No C/O fatigue Peripheral edema Takes fluid pill daily and keeps swelling down Allergic rhinitis Much better. Says that nose spray really works well  Review of Systems  Eyes: Negative for blurred vision.  Respiratory: Negative for shortness of breath.   Cardiovascular: Negative for chest pain.  Endocrine: Negative for polydipsia, polyphagia and polyuria.  Musculoskeletal: Negative for myalgias.  Neurological: Negative for focal weakness, seizures and headaches.  All other systems reviewed and are negative.       Objective:   Physical Exam  Constitutional: She is oriented to person, place, and time. She appears well-developed and well-nourished.  HENT:  Nose: Nose normal.  Mouth/Throat: Oropharynx is clear and moist.  Eyes: EOM are normal.  Neck: Trachea normal, normal range of motion and full passive range of motion without pain. Neck supple. No JVD present. Carotid bruit  is not present. No thyromegaly present.  Cardiovascular: Normal rate, regular rhythm, normal heart sounds and intact distal pulses.  Exam reveals no gallop and no friction rub.  No murmur heard. Pulmonary/Chest: Effort normal and breath sounds normal.  Abdominal: Soft. Bowel sounds are normal. She exhibits no distension and no mass. There is no tenderness.  Musculoskeletal: Normal range of motion.  Lymphadenopathy:    She has no cervical adenopathy.  Neurological: She is alert and oriented to person, place, and time. She has normal reflexes.  Positive 2/4 monofilament bil  Skin: Skin is warm and dry.  Callus formation bil heels and side of first toe bil  Psychiatric: She has a normal mood and affect. Her behavior is normal. Judgment and thought content normal.   BP 118/63  Pulse 69  Temp(Src) 98.2 F (36.8 C) (Oral)  Ht 4\' 11"  (1.499 m)  Wt 123 lb (55.792 kg)  BMI 24.83 kg/m2        Assessment & Plan:  Hypertension  Hyperlipidemia with target LDL less than 100  Hypothyroidism  Allergic rhinitis  Hypokalemia  CONTINUE ALL MEDS RX FOR DIABETIC SHOES GIVEN TO PATIENT To early for labs -will do at next visit Low fat low carb diet and exercise encoouraged RTO in 3 months F/U  Mary-Margaret Daphine Deutscher, FNP

## 2012-09-08 NOTE — Patient Instructions (Signed)

## 2012-09-20 ENCOUNTER — Other Ambulatory Visit: Payer: Self-pay

## 2012-09-20 MED ORDER — LEVOTHYROXINE SODIUM 50 MCG PO TABS: 50.0000 ug | ORAL_TABLET | Freq: Every day | ORAL | Status: DC

## 2012-09-20 NOTE — Telephone Encounter (Signed)
PT aware

## 2012-09-20 NOTE — Telephone Encounter (Signed)
rx ready for pickup 

## 2012-09-20 NOTE — Telephone Encounter (Signed)
Last thyroid labs 9/13   Print for mail order and have nurse call patient to pick up

## 2012-09-30 ENCOUNTER — Other Ambulatory Visit: Payer: Self-pay

## 2012-09-30 MED ORDER — INSULIN PEN NEEDLE 31G X 5 MM MISC: 1.0000 | Freq: Four times a day (QID) | Status: DC

## 2012-09-30 MED ORDER — ALPRAZOLAM 0.5 MG PO TABS: 0.5000 mg | ORAL_TABLET | Freq: Two times a day (BID) | ORAL | Status: DC

## 2012-09-30 NOTE — Telephone Encounter (Signed)
rx ready for pickup 

## 2012-09-30 NOTE — Telephone Encounter (Signed)
Last seen 07/01/12   If approved print out and have nurse call patient to pick up for mail order

## 2012-10-01 NOTE — Telephone Encounter (Signed)
Pt aware, rx ready. 

## 2012-10-07 ENCOUNTER — Ambulatory Visit (INDEPENDENT_AMBULATORY_CARE_PROVIDER_SITE_OTHER): Payer: Medicare PPO | Admitting: Family Medicine

## 2012-10-07 ENCOUNTER — Encounter: Payer: Self-pay | Admitting: Family Medicine

## 2012-10-07 VITALS — BP 118/66 | HR 99 | Temp 97.8°F | Ht 59.0 in | Wt 124.0 lb

## 2012-10-07 DIAGNOSIS — M545 Low back pain: Secondary | ICD-10-CM

## 2012-10-07 MED ORDER — SIMVASTATIN 40 MG PO TABS: 40.0000 mg | ORAL_TABLET | Freq: Every evening | ORAL | Status: DC

## 2012-10-07 MED ORDER — POTASSIUM CHLORIDE CRYS ER 20 MEQ PO TBCR: 20.0000 meq | EXTENDED_RELEASE_TABLET | Freq: Every day | ORAL | Status: DC

## 2012-10-07 MED ORDER — BACLOFEN 20 MG PO TABS: 20.0000 mg | ORAL_TABLET | Freq: Three times a day (TID) | ORAL | Status: DC | PRN

## 2012-10-07 NOTE — Progress Notes (Signed)
°  Subjective:    Patient ID: Ellen Woods, female    DOB: [birth year], 72 y.o.   MRN: [id number]  Back Pain This is a recurrent problem. The current episode started 1 to 4 weeks ago. The problem occurs daily. The problem has been gradually worsening since onset. The pain is present in the gluteal. The quality of the pain is described as aching. The pain does not radiate. The pain is at a severity of 5/10. The pain is worse during the day. The symptoms are aggravated by bending. She has tried NSAIDs for the symptoms. The treatment provided no relief.      Review of Systems  Constitutional: Negative.   HENT: Negative.   Eyes: Negative.   Respiratory: Negative.   Musculoskeletal: Positive for back pain.       Objective:   Physical Exam  Constitutional: She appears well-developed and well-nourished.  HENT:  Head: Normocephalic.  Eyes: Pupils are equal, round, and reactive to light.  Cardiovascular: Normal rate and regular rhythm.   Musculoskeletal: She exhibits tenderness.       Lumbar back: She exhibits tenderness.          Assessment & Plan:  Lumbago Baclofen 20mg  po tid prn back pain #30 no refill Continue otc NSAID as directed  Continue heating pad prn If sx's persist or continue please follow up prn

## 2012-10-07 NOTE — Progress Notes (Signed)
  Subjective:    Patient ID: Ellen Woods, female    DOB: [birth year], 72 y.o.   MRN: [id number]  Back Pain This is a recurrent problem. The current episode started yesterday.      Review of Systems  Musculoskeletal: Positive for back pain.       Objective:   Physical Exam        Assessment & Plan:

## 2012-10-07 NOTE — Patient Instructions (Addendum)
Back Exercises  Back exercises help treat and prevent back injuries. The goal is to increase your strength in your belly (abdominal) and back muscles. These exercises can also help with flexibility. Start these exercises when told by your doctor.  HOME CARE  Back exercises include:  Pelvic Tilt.  · Lie on your back with your knees bent. Tilt your pelvis until the lower part of your back is against the floor. Hold this position 5 to 10 sec. Repeat this exercise 5 to 10 times.  Knee to Chest.  · Pull 1 knee up against your chest and hold for 20 to 30 seconds. Repeat this with the other knee. This may be done with the other leg straight or bent, whichever feels better. Then, pull both knees up against your chest.  Sit-Ups or Curl-Ups.  · Bend your knees 90 degrees. Start with tilting your pelvis, and do a partial, slow sit-up. Only lift your upper half 30 to 45 degrees off the floor. Take at least 2 to 3 seonds for each sit-up. Do not do sit-ups with your knees out straight. If partial sit-ups are difficult, simply do the above but with only tightening your belly (abdominal) muscles and holding it as told.  Hip-Lift.  · Lie on your back with your knees flexed 90 degrees. Push down with your feet and shoulders as you raise your hips 2 inches off the floor. Hold for 10 seconds, repeat 5 to 10 times.  Back Arches.  · Lie on your stomach. Prop yourself up on bent elbows. Slowly press on your hands, causing an arch in your low back. Repeat 3 to 5 times.  Shoulder-Lifts.  · Lie face down with arms beside your body. Keep hips and belly pressed to floor as you slowly lift your head and shoulders off the floor.  Do not overdo your exercises. Be careful in the beginning. Exercises may cause you some mild back discomfort. If the pain lasts for more than 15 minutes, stop the exercises until you see your doctor. Improvement with exercise for back problems is slow.   Document Released: 05/24/2010 Document Revised: 07/14/2011  Document Reviewed: 02/20/2011  [employer]® Patient Information ©2014 [employer].

## 2012-10-25 ENCOUNTER — Encounter: Payer: Self-pay | Admitting: Family Medicine

## 2012-10-25 ENCOUNTER — Ambulatory Visit (INDEPENDENT_AMBULATORY_CARE_PROVIDER_SITE_OTHER): Payer: Medicare PPO | Admitting: Family Medicine

## 2012-10-25 VITALS — BP 100/54 | HR 58 | Temp 97.4°F | Ht 59.0 in | Wt 122.2 lb

## 2012-10-25 DIAGNOSIS — I10 Essential (primary) hypertension: Secondary | ICD-10-CM

## 2012-10-25 DIAGNOSIS — F411 Generalized anxiety disorder: Secondary | ICD-10-CM

## 2012-10-25 DIAGNOSIS — E119 Type 2 diabetes mellitus without complications: Secondary | ICD-10-CM

## 2012-10-25 DIAGNOSIS — J309 Allergic rhinitis, unspecified: Secondary | ICD-10-CM

## 2012-10-25 DIAGNOSIS — E039 Hypothyroidism, unspecified: Secondary | ICD-10-CM

## 2012-10-25 DIAGNOSIS — M549 Dorsalgia, unspecified: Secondary | ICD-10-CM

## 2012-10-25 DIAGNOSIS — M810 Age-related osteoporosis without current pathological fracture: Secondary | ICD-10-CM

## 2012-10-25 DIAGNOSIS — E538 Deficiency of other specified B group vitamins: Secondary | ICD-10-CM

## 2012-10-25 LAB — COMPLETE METABOLIC PANEL WITH GFR
ALT: 13 U/L (ref 0–35)
AST: 21 U/L (ref 0–37)
Albumin: 4.2 g/dL (ref 3.5–5.2)
Alkaline Phosphatase: 72 U/L (ref 39–117)
BUN: 25 mg/dL — ABNORMAL HIGH (ref 6–23)
CO2: 26 mEq/L (ref 19–32)
Calcium: 9.3 mg/dL (ref 8.4–10.5)
Chloride: 103 mEq/L (ref 96–112)
Creat: 0.99 mg/dL (ref 0.50–1.10)
GFR, Est African American: 66 mL/min
GFR, Est Non African American: 58 mL/min — ABNORMAL LOW
Glucose, Bld: 89 mg/dL (ref 70–99)
Potassium: 4.9 mEq/L (ref 3.5–5.3)
Sodium: 138 mEq/L (ref 135–145)
Total Bilirubin: 0.8 mg/dL (ref 0.3–1.2)
Total Protein: 6.8 g/dL (ref 6.0–8.3)

## 2012-10-25 LAB — POCT CBC
Granulocyte percent: 56.7 %G (ref 37–80)
HCT, POC: 40.4 % (ref 37.7–47.9)
Hemoglobin: 13.8 g/dL (ref 12.2–16.2)
Lymph, poc: 1.4 (ref 0.6–3.4)
MCH, POC: 33 pg — AB (ref 27–31.2)
MCHC: 34.1 g/dL (ref 31.8–35.4)
MCV: 96.8 fL (ref 80–97)
MPV: 7.8 fL (ref 0–99.8)
POC Granulocyte: 2.4 (ref 2–6.9)
POC LYMPH PERCENT: 32.9 %L (ref 10–50)
Platelet Count, POC: 232 10*3/uL (ref 142–424)
RBC: 4.2 M/uL (ref 4.04–5.48)
RDW, POC: 13.1 %
WBC: 4.2 10*3/uL — AB (ref 4.6–10.2)

## 2012-10-25 LAB — TSH: TSH: 1.788 u[IU]/mL (ref 0.350–4.500)

## 2012-10-25 LAB — LIPID PANEL
Cholesterol: 191 mg/dL (ref 0–200)
HDL: 60 mg/dL (ref >39)
LDL Cholesterol: 113 mg/dL — ABNORMAL HIGH (ref 0–99)
Total CHOL/HDL Ratio: 3.2 Ratio
Triglycerides: 89 mg/dL (ref <150)
VLDL: 18 mg/dL (ref 0–40)

## 2012-10-25 LAB — VITAMIN B12: Vitamin B-12: 473 pg/mL (ref 211–911)

## 2012-10-25 LAB — POCT GLYCOSYLATED HEMOGLOBIN (HGB A1C): Hemoglobin A1C: 7.1

## 2012-10-25 MED ORDER — FUROSEMIDE 20 MG PO TABS: 20.0000 mg | ORAL_TABLET | Freq: Every day | ORAL | Status: DC

## 2012-10-25 MED ORDER — ALENDRONATE SODIUM 70 MG PO TABS: 70.0000 mg | ORAL_TABLET | ORAL | Status: DC

## 2012-10-25 MED ORDER — ALPRAZOLAM 0.5 MG PO TABS: 0.5000 mg | ORAL_TABLET | Freq: Two times a day (BID) | ORAL | Status: DC

## 2012-10-25 MED ORDER — LEVOTHYROXINE SODIUM 50 MCG PO TABS
50.0000 ug | ORAL_TABLET | Freq: Every day | ORAL | Status: DC
Start: 2012-10-25 — End: 2013-06-13

## 2012-10-25 MED ORDER — LISINOPRIL 5 MG PO TABS: 5.0000 mg | ORAL_TABLET | Freq: Every day | ORAL | Status: DC

## 2012-10-25 MED ORDER — FLUTICASONE PROPIONATE 50 MCG/ACT NA SUSP: 2.0000 | Freq: Every day | NASAL | Status: DC

## 2012-10-25 MED ORDER — VITAMIN B-12 1000 MCG PO TABS: 1000.0000 ug | ORAL_TABLET | Freq: Every day | ORAL | Status: DC

## 2012-10-25 MED ORDER — CETIRIZINE HCL 10 MG PO TABS: 10.0000 mg | ORAL_TABLET | Freq: Every day | ORAL | Status: DC

## 2012-10-25 MED ORDER — BACLOFEN 10 MG PO TABS: 10.0000 mg | ORAL_TABLET | Freq: Three times a day (TID) | ORAL | Status: DC

## 2012-10-25 MED ORDER — INSULIN PEN NEEDLE 31G X 5 MM MISC: 1.0000 | Freq: Four times a day (QID) | Status: DC

## 2012-10-25 MED ORDER — INSULIN GLARGINE 100 UNIT/ML ~~LOC~~ SOLN: 20.0000 [IU] | Freq: Every morning | SUBCUTANEOUS | Status: DC

## 2012-10-25 NOTE — Progress Notes (Signed)
  Subjective:    Patient ID: Ellen Woods, female    DOB: 01/14/1941, 72 y.o.   MRN: [id number]  HPI This 72 y.o. female presents for evaluation of back pain.  She states that the baclofen was too strong so she had to cut it in half and this helped.  She is wanting labs this am.  She has hx of CAD and sees cardiology in [place].  She has hx of DM, hyperlipidemia, hypertension, and osteoporosis.  She has been taking calcium and alendronate.  She is due for BMD.      Review of Systems   C/o back pain. No chest pain, SOB, HA, dizziness, vision change, N/V, diarrhea, constipation, dysuria, urinary urgency or frequency, myalgias, or rash.  Objective:   Physical Exam Vital signs noted  Well developed well nourished female in NAD.  HEENT - Head atraumatic Normocephalic                Eyes - PERRLA, Conjuctiva - clear Sclera- Clear EOMI                Ears - EAC's Wnl TM's Wnl Gross Hearing WNL                Nose - Nares patent                 Throat - oropharanx wnl Respiratory - Lungs CTA bilateral Cardiac - RRR S1 and S2 without murmur GI - Abdomen soft Nontender and bowel sounds active x 4. MS - TTP bilat LS paraspinous muscles. Extremities - No edema. Neuro - Grossly intact.       Assessment & Plan:  Diabetes - Plan: Insulin Pen Needle (B-D UF III MINI PEN NEEDLES) 31G X 5 MM MISC, lisinopril (PRINIVIL,ZESTRIL) 5 MG tablet, insulin glargine (LANTUS) 100 UNIT/ML injection, POCT CBC, POCT glycosylated hemoglobin (Hb A1C), COMPLETE METABOLIC PANEL WITH GFR.   Back pain - Plan: baclofen (LIORESAL) 10 MG tablet  Allergic rhinitis - Plan: cetirizine (ZYRTEC) 10 MG tablet, fluticasone (FLONASE) 50 MCG/ACT nasal spray, TSH  Unspecified hypothyroidism - Plan: levothyroxine (SYNTHROID, LEVOTHROID) 50 MCG tablet, Check TSH today.  Essential hypertension, benign - Plan: lisinopril (PRINIVIL,ZESTRIL) 5 MG tablet, furosemide (LASIX) 20 MG tablet.  Check CMP labs  today.  Osteoporosis, unspecified - Plan: alendronate (FOSAMAX) 70 MG tablet, Vitamin D 25 hydroxy, DG Bone  Density  Anxiety state, unspecified - Plan: ALPRAZolam (XANAX) 0.5 MG tablet and this helps to control.  B12 deficiency - Plan: vitamin B-12 (CYANOCOBALAMIN) 1000 MCG tablet, Vitamin B12 level today. Follow up in 2 weeks.

## 2012-10-25 NOTE — Patient Instructions (Signed)
Back Pain, Adult  Low back pain is very common. About 1 in 5 people have back pain. The cause of low back pain is rarely dangerous. The pain often gets better over time. About half of people with a sudden onset of back pain feel better in just 2 weeks. About 8 in 10 people feel better by 6 weeks.   CAUSES  Some common causes of back pain include:  · Strain of the muscles or ligaments supporting the spine.  · Wear and tear (degeneration) of the spinal discs.  · Arthritis.  · Direct injury to the back.  DIAGNOSIS  Most of the time, the direct cause of low back pain is not known. However, back pain can be treated effectively even when the exact cause of the pain is unknown. Answering your caregiver's questions about your overall health and symptoms is one of the most accurate ways to make sure the cause of your pain is not dangerous. If your caregiver needs more information, he or she may order lab work or imaging tests (X-rays or MRIs). However, even if imaging tests show changes in your back, this usually does not require surgery.  HOME CARE INSTRUCTIONS  For many people, back pain returns. Since low back pain is rarely dangerous, it is often a condition that people can learn to manage on their own.   · Remain active. It is stressful on the back to sit or stand in one place. Do not sit, drive, or stand in one place for more than 30 minutes at a time. Take short walks on level surfaces as soon as pain allows. Try to increase the length of time you walk each day.  · Do not stay in bed. Resting more than 1 or 2 days can delay your recovery.  · Do not avoid exercise or work. Your body is made to move. It is not dangerous to be active, even though your back may hurt. Your back will likely heal faster if you return to being active before your pain is gone.  · Pay attention to your body when you  bend and lift. Many people have less discomfort when lifting if they bend their knees, keep the load close to their bodies, and  avoid twisting. Often, the most comfortable positions are those that put less stress on your recovering back.  · Find a comfortable position to sleep. Use a firm mattress and lie on your side with your knees slightly bent. If you lie on your back, put a pillow under your knees.  · Only take over-the-counter or prescription medicines as directed by your caregiver. Over-the-counter medicines to reduce pain and inflammation are often the most helpful. Your caregiver may prescribe muscle relaxant drugs. These medicines help dull your pain so you can more quickly return to your normal activities and healthy exercise.  · Put ice on the injured area.  · Put ice in a plastic bag.  · Place a towel between your skin and the bag.  · Leave the ice on for 15-20 minutes, 3-4 times a day for the first 2 to 3 days. After that, ice and heat may be alternated to reduce pain and spasms.  · Ask your caregiver about trying back exercises and gentle massage. This may be of some benefit.  · Avoid feeling anxious or stressed. Stress increases muscle tension and can worsen back pain. It is important to recognize when you are anxious or stressed and learn ways to manage it. Exercise is a great option.  SEEK MEDICAL CARE IF:  · You have pain that is not relieved with rest or   medicine.  · You have pain that does not improve in 1 week.  · You have new symptoms.  · You are generally not feeling well.  SEEK IMMEDIATE MEDICAL CARE IF:   · You have pain that radiates from your back into your legs.  · You develop new bowel or bladder control problems.  · You have unusual weakness or numbness in your arms or legs.  · You develop nausea or vomiting.  · You develop abdominal pain.  · You feel faint.  Document Released: 04/21/2005 Document Revised: 10/21/2011 Document Reviewed: 09/09/2010  Exit[employer]® Patient Information ©2014 [employer].

## 2012-10-26 LAB — VITAMIN D 25 HYDROXY (VIT D DEFICIENCY, FRACTURES): Vit D, 25-Hydroxy: 48 ng/mL (ref 30–89)

## 2012-10-29 ENCOUNTER — Other Ambulatory Visit: Payer: Medicare PPO

## 2012-11-08 ENCOUNTER — Ambulatory Visit (INDEPENDENT_AMBULATORY_CARE_PROVIDER_SITE_OTHER): Payer: Medicare PPO | Admitting: Family Medicine

## 2012-11-08 ENCOUNTER — Encounter: Payer: Self-pay | Admitting: Family Medicine

## 2012-11-08 VITALS — BP 115/75 | HR 72 | Temp 97.7°F | Ht 59.0 in | Wt 123.6 lb

## 2012-11-08 DIAGNOSIS — E039 Hypothyroidism, unspecified: Secondary | ICD-10-CM

## 2012-11-08 DIAGNOSIS — M549 Dorsalgia, unspecified: Secondary | ICD-10-CM

## 2012-11-08 DIAGNOSIS — E785 Hyperlipidemia, unspecified: Secondary | ICD-10-CM

## 2012-11-08 DIAGNOSIS — E119 Type 2 diabetes mellitus without complications: Secondary | ICD-10-CM

## 2012-11-08 MED ORDER — INSULIN GLARGINE 100 UNIT/ML ~~LOC~~ SOLN: 26.0000 [IU] | Freq: Every morning | SUBCUTANEOUS | Status: DC

## 2012-11-08 NOTE — Patient Instructions (Signed)

## 2012-11-08 NOTE — Progress Notes (Signed)
  Subjective:    Patient ID: Ellen Woods, female    DOB: [birth year], 72 y.o.   MRN: [id number]  HPI This 72 y.o. female presents for evaluation of follow up on her back pain and labs.  She has hx of DM, hypertension, CAD, hypothyroidism, and hyperlipidmia.  She states her back pain is resolved since taking baclofen.  She has increased her lantus insulin to 26 units sq qd.  She is scheduled for BMD and has hx of Osteoporosis.  She is taking fosamax and tolertates this well.   Review of Systems No chest pain, SOB, HA, dizziness, vision change, N/V, diarrhea, constipation, dysuria, urinary urgency or frequency, myalgias, arthralgias or rash.     Objective:   Physical Exam Vital signs noted  Well developed well nourished female.  HEENT - Head atraumatic Normocephalic                Eyes - PERRLA, Conjuctiva - clear Sclera- Clear EOMI                Ears - EAC's Wnl TM's Wnl Gross Hearing WNL                Nose - Nares patent                 Throat - oropharanx wnl Respiratory - Lungs CTA bilateral Cardiac - RRR S1 and S2 without murmur GI - Abdomen soft Nontender and bowel sounds active x 4 Extremities - No edema. Neuro - Grossly intact.       Assessment & Plan:  Diabetes - Plan: insulin glargine (LANTUS) 100 UNIT/ML injection, DISCONTINUED: insulin glargine (LANTUS) 100 UNIT/ML injection.  Continue Lantus 26 units sq qd.  Follow up in 3 months.  Back pain - Resolved.    Hypothyroidism - continue current replacement.  Hyperlipidemia - Add fish oil tablets to her current regimen.

## 2012-11-12 ENCOUNTER — Emergency Department (HOSPITAL_COMMUNITY): Payer: Medicare HMO

## 2012-11-12 ENCOUNTER — Encounter (HOSPITAL_COMMUNITY): Payer: Self-pay | Admitting: Emergency Medicine

## 2012-11-12 ENCOUNTER — Inpatient Hospital Stay (HOSPITAL_COMMUNITY)
Admission: EM | Admit: 2012-11-12 | Discharge: 2012-11-18 | DRG: 481 | Disposition: A | Discharge status: Skilled Nursing Facility | Payer: Medicare HMO | Attending: Internal Medicine | Admitting: Internal Medicine

## 2012-11-12 DIAGNOSIS — E039 Hypothyroidism, unspecified: Secondary | ICD-10-CM | POA: Diagnosis present

## 2012-11-12 DIAGNOSIS — R9389 Abnormal findings on diagnostic imaging of other specified body structures: Secondary | ICD-10-CM

## 2012-11-12 DIAGNOSIS — R198 Other specified symptoms and signs involving the digestive system and abdomen: Secondary | ICD-10-CM

## 2012-11-12 DIAGNOSIS — Z794 Long term (current) use of insulin: Secondary | ICD-10-CM

## 2012-11-12 DIAGNOSIS — I4891 Unspecified atrial fibrillation: Secondary | ICD-10-CM | POA: Diagnosis present

## 2012-11-12 DIAGNOSIS — S72143A Displaced intertrochanteric fracture of unspecified femur, initial encounter for closed fracture: Principal | ICD-10-CM

## 2012-11-12 DIAGNOSIS — Q250 Patent ductus arteriosus: Secondary | ICD-10-CM

## 2012-11-12 DIAGNOSIS — E782 Mixed hyperlipidemia: Secondary | ICD-10-CM | POA: Diagnosis present

## 2012-11-12 DIAGNOSIS — Z79899 Other long term (current) drug therapy: Secondary | ICD-10-CM

## 2012-11-12 DIAGNOSIS — R0989 Other specified symptoms and signs involving the circulatory and respiratory systems: Secondary | ICD-10-CM

## 2012-11-12 DIAGNOSIS — Y9229 Other specified public building as the place of occurrence of the external cause: Secondary | ICD-10-CM

## 2012-11-12 DIAGNOSIS — W010XXA Fall on same level from slipping, tripping and stumbling without subsequent striking against object, initial encounter: Secondary | ICD-10-CM | POA: Diagnosis present

## 2012-11-12 DIAGNOSIS — K625 Hemorrhage of anus and rectum: Secondary | ICD-10-CM

## 2012-11-12 DIAGNOSIS — Z7982 Long term (current) use of aspirin: Secondary | ICD-10-CM

## 2012-11-12 DIAGNOSIS — I2510 Atherosclerotic heart disease of native coronary artery without angina pectoris: Secondary | ICD-10-CM

## 2012-11-12 DIAGNOSIS — I480 Paroxysmal atrial fibrillation: Secondary | ICD-10-CM

## 2012-11-12 DIAGNOSIS — M810 Age-related osteoporosis without current pathological fracture: Secondary | ICD-10-CM

## 2012-11-12 DIAGNOSIS — I10 Essential (primary) hypertension: Secondary | ICD-10-CM

## 2012-11-12 DIAGNOSIS — R0602 Shortness of breath: Secondary | ICD-10-CM

## 2012-11-12 DIAGNOSIS — E785 Hyperlipidemia, unspecified: Secondary | ICD-10-CM

## 2012-11-12 DIAGNOSIS — R0609 Other forms of dyspnea: Secondary | ICD-10-CM | POA: Diagnosis present

## 2012-11-12 DIAGNOSIS — F411 Generalized anxiety disorder: Secondary | ICD-10-CM

## 2012-11-12 DIAGNOSIS — K5900 Constipation, unspecified: Secondary | ICD-10-CM

## 2012-11-12 DIAGNOSIS — R1319 Other dysphagia: Secondary | ICD-10-CM

## 2012-11-12 DIAGNOSIS — I25119 Atherosclerotic heart disease of native coronary artery with unspecified angina pectoris: Secondary | ICD-10-CM | POA: Diagnosis present

## 2012-11-12 DIAGNOSIS — E119 Type 2 diabetes mellitus without complications: Secondary | ICD-10-CM

## 2012-11-12 DIAGNOSIS — D62 Acute posthemorrhagic anemia: Secondary | ICD-10-CM

## 2012-11-12 DIAGNOSIS — I318 Other specified diseases of pericardium: Secondary | ICD-10-CM

## 2012-11-12 DIAGNOSIS — R072 Precordial pain: Secondary | ICD-10-CM

## 2012-11-12 HISTORY — DX: Chronic obstructive pulmonary disease, unspecified: J44.9

## 2012-11-12 HISTORY — DX: Family history of other specified conditions: Z84.89

## 2012-11-12 LAB — URINALYSIS, ROUTINE W REFLEX MICROSCOPIC
Glucose, UA: NEGATIVE mg/dL
Hgb urine dipstick: NEGATIVE
Leukocytes, UA: NEGATIVE
Protein, ur: NEGATIVE mg/dL
Specific Gravity, Urine: 1.025 (ref 1.005–1.030)
pH: 5.5 (ref 5.0–8.0)

## 2012-11-12 LAB — CBC WITH DIFFERENTIAL/PLATELET
Basophils Relative: 0 % (ref 0–1)
Eosinophils Absolute: 0 10*3/uL (ref 0.0–0.7)
Hemoglobin: 12.5 g/dL (ref 12.0–15.0)
MCH: 33.2 pg (ref 26.0–34.0)
MCHC: 35.3 g/dL (ref 30.0–36.0)
Monocytes Relative: 6 % (ref 3–12)
Neutro Abs: 8.4 10*3/uL — ABNORMAL HIGH (ref 1.7–7.7)
Neutrophils Relative %: 81 % — ABNORMAL HIGH (ref 43–77)
Platelets: 218 10*3/uL (ref 150–400)
RBC: 3.76 MIL/uL — ABNORMAL LOW (ref 3.87–5.11)

## 2012-11-12 LAB — COMPREHENSIVE METABOLIC PANEL
ALT: 17 U/L (ref 0–35)
AST: 31 U/L (ref 0–37)
Albumin: 4 g/dL (ref 3.5–5.2)
Alkaline Phosphatase: 65 U/L (ref 39–117)
Chloride: 102 mEq/L (ref 96–112)
Potassium: 4.4 mEq/L (ref 3.5–5.1)
Sodium: 139 mEq/L (ref 135–145)
Total Bilirubin: 0.7 mg/dL (ref 0.3–1.2)
Total Protein: 7 g/dL (ref 6.0–8.3)

## 2012-11-12 LAB — GLUCOSE, CAPILLARY
Glucose-Capillary: 77 mg/dL (ref 70–99)
Glucose-Capillary: 147 mg/dL — ABNORMAL HIGH (ref 70–99)
Glucose-Capillary: 192 mg/dL — ABNORMAL HIGH (ref 70–99)

## 2012-11-12 MED ORDER — LORATADINE 10 MG PO TABS
10.0000 mg | ORAL_TABLET | Freq: Every day | ORAL | Status: DC
  Administered 2012-11-14 – 2012-11-18 (×5): 10 mg via ORAL
  Filled 2012-11-12 (×6): qty 1

## 2012-11-12 MED ORDER — DEXTROSE-NACL 5-0.9 % IV SOLN
INTRAVENOUS | Status: DC
  Administered 2012-11-13: via INTRAVENOUS

## 2012-11-12 MED ORDER — MORPHINE SULFATE 4 MG/ML IJ SOLN
4.0000 mg | Freq: Once | INTRAMUSCULAR | Status: AC
  Administered 2012-11-12: 4 mg via INTRAVENOUS
  Filled 2012-11-12: qty 1

## 2012-11-12 MED ORDER — INSULIN ASPART 100 UNIT/ML ~~LOC~~ SOLN
0.0000 [IU] | SUBCUTANEOUS | Status: DC
  Administered 2012-11-13: 2 [IU] via SUBCUTANEOUS
  Administered 2012-11-13 (×2): 3 [IU] via SUBCUTANEOUS
  Administered 2012-11-13: 5 [IU] via SUBCUTANEOUS
  Administered 2012-11-14: 3 [IU] via SUBCUTANEOUS

## 2012-11-12 MED ORDER — MORPHINE SULFATE 4 MG/ML IJ SOLN
4.0000 mg | INTRAMUSCULAR | Status: DC | PRN
  Administered 2012-11-12: 4 mg via INTRAVENOUS
  Filled 2012-11-12: qty 1

## 2012-11-12 MED ORDER — DEXTROSE 10 % IV SOLN
INTRAVENOUS | Status: DC
  Administered 2012-11-12: 15:00:00 via INTRAVENOUS
  Filled 2012-11-12: qty 1000

## 2012-11-12 MED ORDER — ALPRAZOLAM 0.5 MG PO TABS
0.5000 mg | ORAL_TABLET | Freq: Two times a day (BID) | ORAL | Status: DC
  Administered 2012-11-13 – 2012-11-18 (×12): 0.5 mg via ORAL
  Filled 2012-11-12 (×13): qty 1

## 2012-11-12 MED ORDER — ONDANSETRON HCL 4 MG/2ML IJ SOLN: 4.0000 mg | Freq: Three times a day (TID) | INTRAMUSCULAR | Status: DC | PRN

## 2012-11-12 MED ORDER — SIMVASTATIN 40 MG PO TABS
40.0000 mg | ORAL_TABLET | Freq: Every evening | ORAL | Status: DC
  Administered 2012-11-13 – 2012-11-17 (×5): 40 mg via ORAL
  Filled 2012-11-12 (×6): qty 1

## 2012-11-12 MED ORDER — SODIUM CHLORIDE 0.9 % IJ SOLN
3.0000 mL | Freq: Two times a day (BID) | INTRAMUSCULAR | Status: DC
  Administered 2012-11-14 – 2012-11-17 (×3): 3 mL via INTRAVENOUS

## 2012-11-12 MED ORDER — ASPIRIN EC 325 MG PO TBEC
325.0000 mg | DELAYED_RELEASE_TABLET | Freq: Every day | ORAL | Status: DC
  Filled 2012-11-12: qty 1

## 2012-11-12 MED ORDER — LEVOTHYROXINE SODIUM 50 MCG PO TABS
50.0000 ug | ORAL_TABLET | Freq: Every day | ORAL | Status: DC
  Administered 2012-11-14 – 2012-11-18 (×5): 50 ug via ORAL
  Filled 2012-11-12 (×7): qty 1

## 2012-11-12 MED ORDER — ONDANSETRON HCL 4 MG/2ML IJ SOLN
4.0000 mg | Freq: Once | INTRAMUSCULAR | Status: AC
  Administered 2012-11-12: 4 mg via INTRAVENOUS
  Filled 2012-11-12: qty 2

## 2012-11-12 MED ORDER — HEPARIN SODIUM (PORCINE) 5000 UNIT/ML IJ SOLN
5000.0000 [IU] | Freq: Three times a day (TID) | INTRAMUSCULAR | Status: DC
  Administered 2012-11-13: 5000 [IU] via SUBCUTANEOUS
  Filled 2012-11-12 (×3): qty 1

## 2012-11-12 MED ORDER — DOCUSATE SODIUM 100 MG PO CAPS
100.0000 mg | ORAL_CAPSULE | Freq: Two times a day (BID) | ORAL | Status: DC
  Administered 2012-11-13 – 2012-11-18 (×10): 100 mg via ORAL
  Filled 2012-11-12 (×14): qty 1

## 2012-11-12 MED ORDER — METOPROLOL TARTRATE 25 MG PO TABS
25.0000 mg | ORAL_TABLET | Freq: Two times a day (BID) | ORAL | Status: DC
  Administered 2012-11-13 (×3): 25 mg via ORAL
  Filled 2012-11-12 (×5): qty 1

## 2012-11-12 MED ORDER — ONDANSETRON HCL 4 MG/2ML IJ SOLN: 4.0000 mg | Freq: Four times a day (QID) | INTRAMUSCULAR | Status: DC | PRN

## 2012-11-12 MED ORDER — ONDANSETRON HCL 4 MG PO TABS: 4.0000 mg | ORAL_TABLET | Freq: Four times a day (QID) | ORAL | Status: DC | PRN

## 2012-11-12 MED ORDER — POTASSIUM CHLORIDE CRYS ER 20 MEQ PO TBCR
20.0000 meq | EXTENDED_RELEASE_TABLET | Freq: Every day | ORAL | Status: DC
  Administered 2012-11-14 – 2012-11-15 (×2): 20 meq via ORAL
  Filled 2012-11-12 (×4): qty 1

## 2012-11-12 MED ORDER — LISINOPRIL 5 MG PO TABS
5.0000 mg | ORAL_TABLET | Freq: Every day | ORAL | Status: DC
  Administered 2012-11-15 – 2012-11-18 (×3): 5 mg via ORAL
  Filled 2012-11-12 (×6): qty 1

## 2012-11-12 NOTE — ED Notes (Signed)
[facility] paged by [facility] for Dr. Lynelle Doctor.

## 2012-11-12 NOTE — ED Notes (Signed)
Foley cath attempted by Ebbie Ridge and Darel Hong R.N. At this time unable to obtain foley but I&o cath was obtained and urine collected and sent over. Ellen Woods

## 2012-11-12 NOTE — ED Notes (Signed)
Pt fell in store. Pt fell backwards. Denies loc. Pt arrived alert/oriented. Pt caught self with hands. Denies hitting head. cbg 82. Pt c/o left hip pain. Obvious swelling noted to anterior left upper thigh. Left foot rotated laterally and pain with moving foot to hip. Pedal pulses present.

## 2012-11-12 NOTE — ED Notes (Signed)
Indwelling cath attempted x 4 by 3 different people with no success. nad at this time.

## 2012-11-12 NOTE — Consult Note (Signed)
Reason for Consult:   Left hip fracture Referring Physician:    EDP [place] and medical team [place]  Ellen Woods is an 72 y.o. female  Who fell in the [employer] store today and could not get up. Was taken to [facility] ED and diagnosed with displaced IT hip fracture.  No ORS in [place] this weekend so transferred to [facility].  Admitted to medicine via hip fracture pathway and ORS consulted.  Medical issues most sig for CAD and IDDM.  Denies LOC and pain elsewhere.  Seen in room with two family members.    Past Medical History  Diagnosis Date  . Other dyspnea and respiratory abnormality     Resolved  . Unspecified essential hypertension   . Type II or unspecified type diabetes mellitus without mention of complication, not stated as uncontrolled   . Coronary atherosclerosis of native coronary artery     50% LAD stenosis  . Encounter for long-term (current) use of insulin   . Other symptoms involving cardiovascular system   . Pain in joint, pelvic region and thigh   . Shortness of breath   . Other chest pain     Atypical chest pain  . PDA (patent ductus arteriosus)     Small PDA documented by CT angiography, no pulmonary hypertension  . Pericardial calcification     Ruled out for restrictive pericarditis  . Thyroid disease   . Arthritis   . Back pain 1/14    Tx by orthopedics    Past Surgical History  Procedure Laterality Date  . Incisional hernia repair    . Abdominal hysterectomy    . Tubal ligation      Family History  Problem Relation Age of Onset  . Stroke Mother     Social History:  reports that she has never smoked. She has never used smokeless tobacco. She reports that she does not drink alcohol or use illicit drugs. Retired Arts development officer  Allergies:  Allergies  Allergen Reactions  . Azithromycin Other (See Comments)    Hospital reaction  . Codeine     REACTION: nausea    Medications: I have reviewed the patient's current medications.  Results for orders placed  during the hospital encounter of 11/12/12 (from the past 48 hour(s))  CBC WITH DIFFERENTIAL     Status: Abnormal   Collection Time    11/12/12  2:20 PM      Result Value Range   WBC 10.4  4.0 - 10.5 K/uL   RBC 3.76 (*) 3.87 - 5.11 MIL/uL   Hemoglobin 12.5  12.0 - 15.0 g/dL   HCT 16.1 (*) 09.6 - 04.5 %   MCV 94.1  78.0 - 100.0 fL   MCH 33.2  26.0 - 34.0 pg   MCHC 35.3  30.0 - 36.0 g/dL   RDW 40.9  81.1 - 91.4 %   Platelets 218  150 - 400 K/uL   Neutrophils Relative % 81 (*) 43 - 77 %   Neutro Abs 8.4 (*) 1.7 - 7.7 K/uL   Lymphocytes Relative 12  12 - 46 %   Lymphs Abs 1.3  0.7 - 4.0 K/uL   Monocytes Relative 6  3 - 12 %   Monocytes Absolute 0.6  0.1 - 1.0 K/uL   Eosinophils Relative 0  0 - 5 %   Eosinophils Absolute 0.0  0.0 - 0.7 K/uL   Basophils Relative 0  0 - 1 %   Basophils Absolute 0.0  0.0 - 0.1 K/uL  COMPREHENSIVE METABOLIC PANEL     Status: Abnormal   Collection Time    11/12/12  2:20 PM      Result Value Range   Sodium 139  135 - 145 mEq/L   Potassium 4.4  3.5 - 5.1 mEq/L   Chloride 102  96 - 112 mEq/L   CO2 26  19 - 32 mEq/L   Glucose, Bld 84  70 - 99 mg/dL   BUN 22  6 - 23 mg/dL   Creatinine, Ser 1.61  0.50 - 1.10 mg/dL   Calcium 9.3  8.4 - 09.6 mg/dL   Total Protein 7.0  6.0 - 8.3 g/dL   Albumin 4.0  3.5 - 5.2 g/dL   AST 31  0 - 37 U/L   ALT 17  0 - 35 U/L   Alkaline Phosphatase 65  39 - 117 U/L   Total Bilirubin 0.7  0.3 - 1.2 mg/dL   GFR calc non Af Amer 64 (*) >90 mL/min   GFR calc Af Amer 74 (*) >90 mL/min   Comment:            The eGFR has been calculated     using the CKD EPI equation.     This calculation has not been     validated in all clinical     situations.     eGFR's persistently     <90 mL/min signify     possible Chronic Kidney Disease.  APTT     Status: None   Collection Time    11/12/12  2:20 PM      Result Value Range   aPTT 26  24 - 37 seconds  PROTIME-INR     Status: None   Collection Time    11/12/12  2:20 PM      Result  Value Range   Prothrombin Time 13.1  11.6 - 15.2 seconds   INR 1.01  0.00 - 1.49  GLUCOSE, CAPILLARY     Status: None   Collection Time    11/12/12  2:46 PM      Result Value Range   Glucose-Capillary 77  70 - 99 mg/dL  URINALYSIS, ROUTINE W REFLEX MICROSCOPIC     Status: None   Collection Time    11/12/12  3:45 PM      Result Value Range   Color, Urine YELLOW  YELLOW   APPearance CLEAR  CLEAR   Specific Gravity, Urine 1.025  1.005 - 1.030   pH 5.5  5.0 - 8.0   Glucose, UA NEGATIVE  NEGATIVE mg/dL   Hgb urine dipstick NEGATIVE  NEGATIVE   Bilirubin Urine NEGATIVE  NEGATIVE   Ketones, ur NEGATIVE  NEGATIVE mg/dL   Protein, ur NEGATIVE  NEGATIVE mg/dL   Urobilinogen, UA 0.2  0.0 - 1.0 mg/dL   Nitrite NEGATIVE  NEGATIVE   Leukocytes, UA NEGATIVE  NEGATIVE   Comment: MICROSCOPIC NOT DONE ON URINES WITH NEGATIVE PROTEIN, BLOOD, LEUKOCYTES, NITRITE, OR GLUCOSE <1000 mg/dL.  GLUCOSE, CAPILLARY     Status: Abnormal   Collection Time    11/12/12  5:28 PM      Result Value Range   Glucose-Capillary 147 (*) 70 - 99 mg/dL  GLUCOSE, CAPILLARY     Status: Abnormal   Collection Time    11/12/12  7:46 PM      Result Value Range   Glucose-Capillary 192 (*) 70 - 99 mg/dL  GLUCOSE, CAPILLARY     Status: Abnormal   Collection Time  11/12/12  9:30 PM      Result Value Range   Glucose-Capillary 162 (*) 70 - 99 mg/dL    Dg Chest 1 View  1/61/0960   *RADIOLOGY REPORT*  Clinical Data: Fall, left hip fracture  CHEST - 1 VIEW  Comparison: 10/20/2007  Findings: Borderline cardiomegaly.  No acute infiltrate or pleural effusion.  No pulmonary edema.  No diagnostic pneumothorax. Mild thoracic dextroscoliosis.  IMPRESSION: Borderline cardiomegaly.  No diagnostic pneumothorax.   Original Report Authenticated By: Natasha Mead, M.D.   Dg Hip Complete Left  11/12/2012   *RADIOLOGY REPORT*  Clinical Data: Fall  LEFT HIP - COMPLETE 2+ VIEW  Comparison: None.  Findings: Three views of the left hip submitted.   There is mild displaced intertrochanteric fracture of proximal left femur.  IMPRESSION: Mild displaced intertrochanteric fracture of proximal left femur.   Original Report Authenticated By: Natasha Mead, M.D.    @ROS @ Blood pressure 129/48, pulse 100, temperature 98.6 F (37 C), temperature source Oral, resp. rate 18, height 4\' 11"  (1.499 m), weight 55.792 kg (123 lb), SpO2 99.00%.  PHYSICAL EXAM:   Left leg SER, calf soft, CMS OK, other leg and both arms move fully,  Cspine moves well, head is atraumatic, RR, abd soft.  ASSESSMENT:   Left IT hip fracture  PLAN:   ORIF with troch nail after medical clearance.  Plan for tomorrow afternoon assuming medical team agrees.  Discussed with family. May eat now and NPO after 2am.     Alexie Lanni G 11/12/2012, 10:36 PM

## 2012-11-12 NOTE — ED Notes (Signed)
Pt and family concerned of pain with transfer. Advised will pre med pt jprior to moving but could still have pain med now if she wanted. Pt stated yes and gave pain score of 9. Pt has no s/s of pain but has been slightly anxious since arrived.

## 2012-11-12 NOTE — ED Notes (Signed)
Hospitalist at [facility] now being paged by [facility] for Dr. Lynelle Doctor.

## 2012-11-12 NOTE — ED Notes (Signed)
[facility] given bed assignment at [facility], and will send out a truck.

## 2012-11-12 NOTE — ED Provider Notes (Signed)
History  This chart was scribed for Ward Givens, MD, MD by Ashley Jacobs, ED Scribe and Bennett Scrape, ED Scribe. The patient was seen in room [facility] and the patient's care was started at 1312.    CSN: [id number] Arrival date & time 11/12/12  1248    Chief Complaint  Patient presents with  . Fall    Patient is a 72 y.o. female presenting with fall. The history is provided by the patient and medical records. No language interpreter was used.  Fall This is a new problem. The current episode started less than 1 hour ago. Episode frequency: once. The problem has been resolved. Pertinent negatives include no chest pain, no abdominal pain and no shortness of breath. The symptoms are aggravated by walking (movement). Nothing relieves the symptoms. She has tried nothing for the symptoms.    HPI Comments: Ellen Woods is a 72 y.o. female brought in by ambulance, who presents to the Emergency Department complaining of a fall that occurred in a [employer] store when she was pivoting around PTA. Pt states that she lost her balance and fell backwards landing on her left hip. Pt reports associated  left hip pain.  Pt reports that she heard a loud "pop" when people tried to help her up to sit in a chair.She was unable to ambulate on scene. At baseline, she ambulates without assistance. Pt denies receiving any medications PTA.  She denies falling b/o dizziness. Pt denies back pain, abdominal pain, arm pain, CP, head injury, neck pain, LOC or SOB.  Pt denies smoking and alcohol use. Pt is allergic to codeine.   PCP is at [facility] Also seen [facility] for knee pain once. Cardiologist is with [facility]   Past Medical History  Diagnosis Date  . Other dyspnea and respiratory abnormality     Resolved  . Unspecified essential hypertension   . Type II or unspecified type diabetes mellitus without mention of complication, not stated as uncontrolled   . Coronary atherosclerosis  of native coronary artery     50% LAD stenosis  . Encounter for long-term (current) use of insulin   . Other symptoms involving cardiovascular system   . Pain in joint, pelvic region and thigh   . Shortness of breath   . Other chest pain     Atypical chest pain  . PDA (patent ductus arteriosus)     Small PDA documented by CT angiography, no pulmonary hypertension  . Pericardial calcification     Ruled out for restrictive pericarditis  . Thyroid disease   . Arthritis   . Back pain 1/14    Tx by orthopedics   Past Surgical History  Procedure Laterality Date  . Incisional hernia repair    . Abdominal hysterectomy    . Tubal ligation     Family History  Problem Relation Age of Onset  . Stroke Mother    History  Substance Use Topics  . Smoking status: Never Smoker   . Smokeless tobacco: Never Used  . Alcohol Use: No  lives at home lives with daughter  does not use a cane or walker  No OB history provided.   Review of Systems  Respiratory: Negative for shortness of breath.   Cardiovascular: Negative for chest pain.  Gastrointestinal: Negative for abdominal pain.  Musculoskeletal: Positive for arthralgias. Negative for back pain.       Left hip pain  Neurological: Negative for dizziness.  All other systems reviewed and are negative.  Allergies  Azithromycin and Codeine  Home Medications   Current Outpatient Rx  Name  Route  Sig  Dispense  Refill  . alendronate (FOSAMAX) 70 MG tablet   Oral   Take 1 tablet (70 mg total) by mouth every 7 (seven) days. Take with a full glass of water on an empty stomach.   4 tablet   11   . ALPRAZolam (XANAX) 0.5 MG tablet   Oral   Take 1 tablet (0.5 mg total) by mouth 2 (two) times daily.   180 tablet   0   . aspirin EC 325 MG tablet   Oral   Take 1 tablet (325 mg total) by mouth daily.           Dose increased 03/15/2012.   . baclofen (LIORESAL) 10 MG tablet   Oral   Take 1 tablet (10 mg total) by mouth 3  (three) times daily.   30 each   1   . cetirizine (ZYRTEC) 10 MG tablet   Oral   Take 1 tablet (10 mg total) by mouth daily.   30 tablet   5   . Chromium Picolinate 500 MCG TABS   Oral   Take 1 tablet by mouth daily.         . fluticasone (FLONASE) 50 MCG/ACT nasal spray   Nasal   Place 2 sprays into the nose daily.   16 g   3   . furosemide (LASIX) 20 MG tablet   Oral   Take 1 tablet (20 mg total) by mouth daily.   30 tablet   5   . insulin glargine (LANTUS) 100 UNIT/ML injection   Subcutaneous   Inject 0.26 mLs (26 Units total) into the skin every morning.   5 pen   3   . Insulin Pen Needle (B-D UF III MINI PEN NEEDLES) 31G X 5 MM MISC   Does not apply   1 Stick by Does not apply route QID.   100 each   3   . insulin regular (NOVOLIN R,HUMULIN R) 100 units/mL injection   Subcutaneous   Inject into the skin 3 (three) times daily before meals. Sliding scale         . levothyroxine (SYNTHROID, LEVOTHROID) 50 MCG tablet   Oral   Take 1 tablet (50 mcg total) by mouth daily.   90 tablet   1   . lisinopril (PRINIVIL,ZESTRIL) 5 MG tablet   Oral   Take 1 tablet (5 mg total) by mouth daily.   30 tablet   5   . metoprolol tartrate (LOPRESSOR) 25 MG tablet               . Multiple Vitamin (MULTIVITAMIN) tablet   Oral   Take 1 tablet by mouth daily.         Marland Kitchen NITROSTAT 0.4 MG SL tablet   Sublingual   Place 1 tablet (0.4 mg total) under the tongue every 5 (five) minutes as needed for chest pain.   30 tablet   0     Dispense as written.   . potassium chloride SA (K-DUR,KLOR-CON) 20 MEQ tablet   Oral   Take 1 tablet (20 mEq total) by mouth daily.   90 tablet   1   . PRODIGY NO CODING BLOOD GLUC test strip               . simvastatin (ZOCOR) 40 MG tablet   Oral   Take 1 tablet (40  mg total) by mouth every evening.   90 tablet   1   . vitamin B-12 (CYANOCOBALAMIN) 1000 MCG tablet   Oral   Take 1 tablet (1,000 mcg total) by mouth  daily.   30 tablet   5    BP 184/82  Pulse 110  Temp(Src) 98.5 F (36.9 C) (Oral)  Resp 16  Ht 4\' 11"  (1.499 m)  Wt 123 lb (55.792 kg)  BMI 24.83 kg/m2  SpO2 100%  Vital signs normal except tachycardia   Physical Exam  Nursing note and vitals reviewed. Constitutional: She is oriented to person, place, and time. She appears well-developed and well-nourished.  Non-toxic appearance. She does not appear ill. No distress.  HENT:  Head: Normocephalic and atraumatic.  Right Ear: External ear normal.  Left Ear: External ear normal.  Nose: Nose normal. No mucosal edema or rhinorrhea.  Mouth/Throat: Oropharynx is clear and moist and mucous membranes are normal. No dental abscesses or edematous.  Eyes: Conjunctivae and EOM are normal. Pupils are equal, round, and reactive to light.  Neck: Normal range of motion and full passive range of motion without pain. Neck supple.  Cardiovascular: Normal rate, regular rhythm and normal heart sounds.  Exam reveals no gallop and no friction rub.   No murmur heard. Pulmonary/Chest: Effort normal and breath sounds normal. No respiratory distress. She has no wheezes. She has no rhonchi. She has no rales. She exhibits no tenderness and no crepitus.  Abdominal: Soft. Normal appearance and bowel sounds are normal. She exhibits no distension. There is no tenderness. There is no rebound and no guarding.  Musculoskeletal: Normal range of motion. She exhibits no edema and no tenderness.  External rotation of left leg  No shortening Swelling in proximal thigh of left leg  Neurological: She is alert and oriented to person, place, and time. She has normal strength. No cranial nerve deficit.  Skin: Skin is warm, dry and intact. No rash noted. No erythema. No pallor.  Psychiatric: She has a normal mood and affect. Her speech is normal and behavior is normal. Her mood appears not anxious.    ED Course  Procedures (including critical care time)  Medications   dextrose 10 % infusion ( Intravenous New Bag/Given 11/12/12 1504)  morphine 4 MG/ML injection 4 mg (4 mg Intravenous Given 11/12/12 1327)  ondansetron (ZOFRAN) injection 4 mg (4 mg Intravenous Given 11/12/12 1328)  morphine 4 MG/ML injection 4 mg (4 mg Intravenous Given 11/12/12 1457)  morphine 4 MG/ML injection 4 mg (4 mg Intravenous Given 11/12/12 1833)    DIAGNOSTIC STUDIES: Oxygen Saturation is 100% on room air, normal by my interpretation.    COORDINATION OF CARE: 1312 Discussed course of care with pt . Pt understands and agrees.  Pt started on D10 since she has taken her insulin and her CBG is low and she is NPO right now until discussed with orthopedist about when her surgery will occur. Pt has hx of CHF so her IV fluid rate was kept low by giving D10 instead of D5.   1500 Discussed treatment plan and prescription with pt. Also discussed lab work and radiology results. Pt agrees and understands. She wants to have her surgery done here.   16:56 Secretary just found out there is no orthopedic call today and all weekend.   17:30 Dr Thomasena Edis, [place] orthopedics states to make unassigned  Discussed with patient that she will need to be unassigned, prefers admission to [facility]  17:38 Dr Jerl Santos, [facility]assigned [facility] at  [facility] states he will fix her fracture tonight if medically cleared by medicine.   18:07 Dr Karilyn Cota, hospitalist [facility], states to have the hospitalist at [facility] do her admission  18:26 Dr Susie Cassette, admit to [facility] tele, team 10  Pt and family informed of who is going to be admitting her and who is going to do her surgery.   Results for orders placed during the hospital encounter of 11/12/12  CBC WITH DIFFERENTIAL      Result Value Range   WBC 10.4  4.0 - 10.5 K/uL   RBC 3.76 (*) 3.87 - 5.11 MIL/uL   Hemoglobin 12.5  12.0 - 15.0 g/dL   HCT 40.9 (*) 81.1 - 91.4 %   MCV 94.1  78.0 - 100.0 fL   MCH 33.2  26.0 - 34.0 pg   MCHC 35.3  30.0 - 36.0 g/dL   RDW 78.2  95.6 - 21.3 %   Platelets 218   150 - 400 K/uL   Neutrophils Relative % 81 (*) 43 - 77 %   Neutro Abs 8.4 (*) 1.7 - 7.7 K/uL   Lymphocytes Relative 12  12 - 46 %   Lymphs Abs 1.3  0.7 - 4.0 K/uL   Monocytes Relative 6  3 - 12 %   Monocytes Absolute 0.6  0.1 - 1.0 K/uL   Eosinophils Relative 0  0 - 5 %   Eosinophils Absolute 0.0  0.0 - 0.7 K/uL   Basophils Relative 0  0 - 1 %   Basophils Absolute 0.0  0.0 - 0.1 K/uL  COMPREHENSIVE METABOLIC PANEL      Result Value Range   Sodium 139  135 - 145 mEq/L   Potassium 4.4  3.5 - 5.1 mEq/L   Chloride 102  96 - 112 mEq/L   CO2 26  19 - 32 mEq/L   Glucose, Bld 84  70 - 99 mg/dL   BUN 22  6 - 23 mg/dL   Creatinine, Ser 0.86  0.50 - 1.10 mg/dL   Calcium 9.3  8.4 - 57.8 mg/dL   Total Protein 7.0  6.0 - 8.3 g/dL   Albumin 4.0  3.5 - 5.2 g/dL   AST 31  0 - 37 U/L   ALT 17  0 - 35 U/L   Alkaline Phosphatase 65  39 - 117 U/L   Total Bilirubin 0.7  0.3 - 1.2 mg/dL   GFR calc non Af Amer 64 (*) >90 mL/min   GFR calc Af Amer 74 (*) >90 mL/min  APTT      Result Value Range   aPTT 26  24 - 37 seconds  PROTIME-INR      Result Value Range   Prothrombin Time 13.1  11.6 - 15.2 seconds   INR 1.01  0.00 - 1.49  URINALYSIS, ROUTINE W REFLEX MICROSCOPIC      Result Value Range   Color, Urine YELLOW  YELLOW   APPearance CLEAR  CLEAR   Specific Gravity, Urine 1.025  1.005 - 1.030   pH 5.5  5.0 - 8.0   Glucose, UA NEGATIVE  NEGATIVE mg/dL   Hgb urine dipstick NEGATIVE  NEGATIVE   Bilirubin Urine NEGATIVE  NEGATIVE   Ketones, ur NEGATIVE  NEGATIVE mg/dL   Protein, ur NEGATIVE  NEGATIVE mg/dL   Urobilinogen, UA 0.2  0.0 - 1.0 mg/dL   Nitrite NEGATIVE  NEGATIVE   Leukocytes, UA NEGATIVE  NEGATIVE  GLUCOSE, CAPILLARY      Result Value Range   Glucose-Capillary  77  70 - 99 mg/dL  GLUCOSE, CAPILLARY      Result Value Range   Glucose-Capillary 147 (*) 70 - 99 mg/dL  GLUCOSE, CAPILLARY      Result Value Range   Glucose-Capillary 192 (*) 70 - 99 mg/dL   Laboratory interpretation  all normal   Dg Chest 1 View  11/12/2012   *RADIOLOGY REPORT*  Clinical Data: Fall, left hip fracture  CHEST - 1 VIEW  Comparison: 10/20/2007  Findings: Borderline cardiomegaly.  No acute infiltrate or pleural effusion.  No pulmonary edema.  No diagnostic pneumothorax. Mild thoracic dextroscoliosis.  IMPRESSION: Borderline cardiomegaly.  No diagnostic pneumothorax.   Original Report Authenticated By: Natasha Mead, M.D.   Dg Hip Complete Left  11/12/2012   *RADIOLOGY REPORT*  Clinical Data: Fall  LEFT HIP - COMPLETE 2+ VIEW  Comparison: None.  Findings: Three views of the left hip submitted.  There is mild displaced intertrochanteric fracture of proximal left femur.  IMPRESSION: Mild displaced intertrochanteric fracture of proximal left femur.   Original Report Authenticated By: Natasha Mead, M.D.    Date: 11/12/2012  Rate: 79  Rhythm: normal sinus rhythm  QRS Axis: normal  Intervals: normal  ST/T Wave abnormalities: nonspecific T wave changes  Conduction Disutrbances:none  Narrative Interpretation:   Old EKG Reviewed: none available    1. Fx intertrochanteric hip, left, closed, initial encounter      Plan admission to [facility]   Devoria Albe, MD, FACEP   MDM   I personally performed the services described in this documentation, which was scribed in my presence. The recorded information has been reviewed and considered.  Devoria Albe, MD, Armando Gang    Ward Givens, MD 11/12/12 2128

## 2012-11-12 NOTE — Progress Notes (Signed)
Patient presented with a fall Found to have a Mild displaced intertrochanteric fracture of proximal left femur Dr. Lynelle Doctor spoke with Dr. Yisroel Ramming , oncology was called for orthopedics  History of 50% stenosis to the LAD Followed by cardiology outpatient Being  admitted to telemetry [facility]

## 2012-11-12 NOTE — ED Notes (Signed)
Request made by Dr. Lynelle Doctor to call Earlean Polka

## 2012-11-13 ENCOUNTER — Encounter (HOSPITAL_COMMUNITY)
Admission: EM | Disposition: A | Discharge status: Skilled Nursing Facility | Payer: Self-pay | Source: Home / Self Care | Attending: Internal Medicine

## 2012-11-13 ENCOUNTER — Encounter (HOSPITAL_COMMUNITY): Payer: Self-pay | Admitting: Anesthesiology

## 2012-11-13 ENCOUNTER — Inpatient Hospital Stay (HOSPITAL_COMMUNITY): Payer: Medicare HMO

## 2012-11-13 ENCOUNTER — Inpatient Hospital Stay (HOSPITAL_COMMUNITY): Payer: Medicare HMO | Admitting: Anesthesiology

## 2012-11-13 DIAGNOSIS — I10 Essential (primary) hypertension: Secondary | ICD-10-CM

## 2012-11-13 HISTORY — PX: INTRAMEDULLARY (IM) NAIL INTERTROCHANTERIC: SHX5875

## 2012-11-13 LAB — CBC
MCH: 32.3 pg (ref 26.0–34.0)
MCHC: 33.8 g/dL (ref 30.0–36.0)
MCV: 95.6 fL (ref 78.0–100.0)
Platelets: 180 10*3/uL (ref 150–400)
RBC: 3.19 MIL/uL — ABNORMAL LOW (ref 3.87–5.11)

## 2012-11-13 LAB — SURGICAL PCR SCREEN
MRSA, PCR: NEGATIVE
Staphylococcus aureus: NEGATIVE

## 2012-11-13 LAB — GLUCOSE, CAPILLARY: Glucose-Capillary: 175 mg/dL — ABNORMAL HIGH (ref 70–99)

## 2012-11-13 LAB — TSH: TSH: 5.376 u[IU]/mL — ABNORMAL HIGH (ref 0.350–4.500)

## 2012-11-13 LAB — SAMPLE TO BLOOD BANK

## 2012-11-13 SURGERY — FIXATION, FRACTURE, INTERTROCHANTERIC, WITH INTRAMEDULLARY ROD
Anesthesia: Spinal | Site: Hip | Laterality: Left | Wound class: Clean

## 2012-11-13 MED ORDER — ACETAMINOPHEN 325 MG PO TABS
650.0000 mg | ORAL_TABLET | Freq: Four times a day (QID) | ORAL | Status: DC | PRN
  Administered 2012-11-14: 650 mg via ORAL
  Filled 2012-11-13: qty 2

## 2012-11-13 MED ORDER — CEFAZOLIN SODIUM-DEXTROSE 2-3 GM-% IV SOLR
INTRAVENOUS | Status: AC
  Administered 2012-11-13: 15:00:00
  Filled 2012-11-13: qty 50

## 2012-11-13 MED ORDER — 0.9 % SODIUM CHLORIDE (POUR BTL) OPTIME
TOPICAL | Status: DC | PRN
  Administered 2012-11-13: 1000 mL

## 2012-11-13 MED ORDER — HYDROCODONE-ACETAMINOPHEN 5-325 MG PO TABS
1.0000 | ORAL_TABLET | Freq: Four times a day (QID) | ORAL | Status: DC | PRN
  Administered 2012-11-13 – 2012-11-14 (×2): 1 via ORAL
  Administered 2012-11-14 (×2): 2 via ORAL
  Administered 2012-11-14: 1 via ORAL
  Administered 2012-11-15 – 2012-11-18 (×6): 2 via ORAL
  Filled 2012-11-13: qty 2
  Filled 2012-11-13: qty 1
  Filled 2012-11-13: qty 2
  Filled 2012-11-13: qty 1
  Filled 2012-11-13 (×5): qty 2
  Filled 2012-11-13: qty 1
  Filled 2012-11-13: qty 2

## 2012-11-13 MED ORDER — MORPHINE SULFATE 2 MG/ML IJ SOLN
2.0000 mg | INTRAMUSCULAR | Status: DC | PRN
  Administered 2012-11-13 (×2): 2 mg via INTRAVENOUS
  Filled 2012-11-13 (×2): qty 1

## 2012-11-13 MED ORDER — METHOCARBAMOL 500 MG PO TABS
500.0000 mg | ORAL_TABLET | Freq: Four times a day (QID) | ORAL | Status: DC | PRN
  Administered 2012-11-13 – 2012-11-14 (×2): 500 mg via ORAL
  Filled 2012-11-13 (×2): qty 1

## 2012-11-13 MED ORDER — LIDOCAINE IN DEXTROSE 5-7.5 % IV SOLN
INTRAVENOUS | Status: DC | PRN
  Administered 2012-11-13: 60 mg via INTRATHECAL

## 2012-11-13 MED ORDER — PHENOL 1.4 % MT LIQD: 1.0000 | OROMUCOSAL | Status: DC | PRN

## 2012-11-13 MED ORDER — FENTANYL CITRATE 0.05 MG/ML IJ SOLN
INTRAMUSCULAR | Status: DC | PRN
  Administered 2012-11-13 (×4): 50 ug via INTRAVENOUS
  Administered 2012-11-13 (×2): 25 ug via INTRAVENOUS

## 2012-11-13 MED ORDER — MIDAZOLAM HCL 2 MG/2ML IJ SOLN: 1.0000 mg | INTRAMUSCULAR | Status: DC | PRN

## 2012-11-13 MED ORDER — CEFAZOLIN SODIUM-DEXTROSE 2-3 GM-% IV SOLR
2.0000 g | Freq: Three times a day (TID) | INTRAVENOUS | Status: DC
  Administered 2012-11-13: 2 g via INTRAVENOUS
  Filled 2012-11-13 (×2): qty 50

## 2012-11-13 MED ORDER — FENTANYL CITRATE 0.05 MG/ML IJ SOLN: 50.0000 ug | Freq: Once | INTRAMUSCULAR | Status: DC

## 2012-11-13 MED ORDER — METOCLOPRAMIDE HCL 5 MG PO TABS
5.0000 mg | ORAL_TABLET | Freq: Three times a day (TID) | ORAL | Status: DC | PRN
  Filled 2012-11-13: qty 2

## 2012-11-13 MED ORDER — ACETAMINOPHEN 650 MG RE SUPP: 650.0000 mg | Freq: Four times a day (QID) | RECTAL | Status: DC | PRN

## 2012-11-13 MED ORDER — PROPOFOL 10 MG/ML IV BOLUS
INTRAVENOUS | Status: DC | PRN
  Administered 2012-11-13 (×3): 10 mg via INTRAVENOUS
  Administered 2012-11-13: 20 mg via INTRAVENOUS

## 2012-11-13 MED ORDER — ASPIRIN EC 325 MG PO TBEC
325.0000 mg | DELAYED_RELEASE_TABLET | Freq: Two times a day (BID) | ORAL | Status: DC
  Administered 2012-11-14 – 2012-11-18 (×9): 325 mg via ORAL
  Filled 2012-11-13 (×11): qty 1

## 2012-11-13 MED ORDER — PROPOFOL INFUSION 10 MG/ML OPTIME
INTRAVENOUS | Status: DC | PRN
  Administered 2012-11-13: 10 ug/kg/min via INTRAVENOUS

## 2012-11-13 MED ORDER — FERROUS SULFATE 325 (65 FE) MG PO TABS
325.0000 mg | ORAL_TABLET | Freq: Every day | ORAL | Status: DC
  Administered 2012-11-14 – 2012-11-15 (×2): 325 mg via ORAL
  Filled 2012-11-13 (×3): qty 1

## 2012-11-13 MED ORDER — MORPHINE SULFATE 2 MG/ML IJ SOLN
INTRAMUSCULAR | Status: AC
  Administered 2012-11-13: 2 mg via INTRAVENOUS
  Filled 2012-11-13: qty 1

## 2012-11-13 MED ORDER — CEFAZOLIN SODIUM-DEXTROSE 2-3 GM-% IV SOLR
2.0000 g | Freq: Four times a day (QID) | INTRAVENOUS | Status: AC
  Administered 2012-11-13 – 2012-11-14 (×2): 2 g via INTRAVENOUS
  Filled 2012-11-13 (×2): qty 50

## 2012-11-13 MED ORDER — FENTANYL CITRATE 0.05 MG/ML IJ SOLN: 25.0000 ug | INTRAMUSCULAR | Status: DC | PRN

## 2012-11-13 MED ORDER — ONDANSETRON HCL 4 MG/2ML IJ SOLN
4.0000 mg | Freq: Four times a day (QID) | INTRAMUSCULAR | Status: DC | PRN
  Administered 2012-11-13 – 2012-11-16 (×3): 4 mg via INTRAVENOUS
  Filled 2012-11-13 (×3): qty 2

## 2012-11-13 MED ORDER — MENTHOL 3 MG MT LOZG
1.0000 | LOZENGE | OROMUCOSAL | Status: DC | PRN
  Filled 2012-11-13: qty 9

## 2012-11-13 MED ORDER — LACTATED RINGERS IV SOLN
INTRAVENOUS | Status: DC | PRN
  Administered 2012-11-13 (×2): via INTRAVENOUS

## 2012-11-13 MED ORDER — PHENYLEPHRINE HCL 10 MG/ML IJ SOLN
INTRAMUSCULAR | Status: DC | PRN
  Administered 2012-11-13: 80 ug via INTRAVENOUS
  Administered 2012-11-13: 40 ug via INTRAVENOUS
  Administered 2012-11-13 (×6): 80 ug via INTRAVENOUS

## 2012-11-13 MED ORDER — METHOCARBAMOL 100 MG/ML IJ SOLN
500.0000 mg | Freq: Four times a day (QID) | INTRAVENOUS | Status: DC | PRN
  Filled 2012-11-13: qty 5

## 2012-11-13 MED ORDER — ONDANSETRON HCL 4 MG/2ML IJ SOLN
INTRAMUSCULAR | Status: AC
  Administered 2012-11-13: 18:00:00
  Filled 2012-11-13: qty 2

## 2012-11-13 MED ORDER — METOCLOPRAMIDE HCL 5 MG/ML IJ SOLN
5.0000 mg | Freq: Three times a day (TID) | INTRAMUSCULAR | Status: DC | PRN
  Administered 2012-11-16 – 2012-11-17 (×2): 10 mg via INTRAVENOUS
  Filled 2012-11-13 (×2): qty 2

## 2012-11-13 MED ORDER — ONDANSETRON HCL 4 MG PO TABS
4.0000 mg | ORAL_TABLET | Freq: Four times a day (QID) | ORAL | Status: DC | PRN
  Administered 2012-11-16: 4 mg via ORAL
  Filled 2012-11-13: qty 1

## 2012-11-13 SURGICAL SUPPLY — 45 items
BANDAGE GAUZE ELAST BULKY 4 IN (GAUZE/BANDAGES/DRESSINGS) ×2 IMPLANT
BIT DRILL 4.3MMS DISTAL GRDTED (BIT) ×1 IMPLANT
BNDG COHESIVE 4X5 TAN STRL (GAUZE/BANDAGES/DRESSINGS) ×6 IMPLANT
CLOTH BEACON ORANGE TIMEOUT ST (SAFETY) ×2 IMPLANT
DRAPE STERI IOBAN 125X83 (DRAPES) ×2 IMPLANT
DRILL 4.3MMS DISTAL GRADUATED (BIT) ×2
DRSG ADAPTIC 3X8 NADH LF (GAUZE/BANDAGES/DRESSINGS) ×2 IMPLANT
DRSG MEPILEX BORDER 4X4 (GAUZE/BANDAGES/DRESSINGS) ×6 IMPLANT
DRSG MEPILEX BORDER 4X8 (GAUZE/BANDAGES/DRESSINGS) IMPLANT
DRSG PAD ABDOMINAL 8X10 ST (GAUZE/BANDAGES/DRESSINGS) ×4 IMPLANT
DURAPREP 26ML APPLICATOR (WOUND CARE) ×2 IMPLANT
ELECT CAUTERY BLADE 6.4 (BLADE) ×2 IMPLANT
ELECT REM PT RETURN 9FT ADLT (ELECTROSURGICAL) ×2
ELECTRODE REM PT RTRN 9FT ADLT (ELECTROSURGICAL) ×1 IMPLANT
EVACUATOR 1/8 PVC DRAIN (DRAIN) IMPLANT
GLOVE BIO SURGEON STRL SZ8.5 (GLOVE) ×2 IMPLANT
GLOVE BIOGEL PI IND STRL 8 (GLOVE) ×1 IMPLANT
GLOVE BIOGEL PI IND STRL 8.5 (GLOVE) ×1 IMPLANT
GLOVE BIOGEL PI INDICATOR 8 (GLOVE) ×1
GLOVE BIOGEL PI INDICATOR 8.5 (GLOVE) ×1
GLOVE SS BIOGEL STRL SZ 8 (GLOVE) ×1 IMPLANT
GLOVE SUPERSENSE BIOGEL SZ 8 (GLOVE) ×1
GOWN PREVENTION PLUS XLARGE (GOWN DISPOSABLE) ×4 IMPLANT
GOWN PREVENTION PLUS XXLARGE (GOWN DISPOSABLE) ×2 IMPLANT
GOWN STRL NON-REIN LRG LVL3 (GOWN DISPOSABLE) ×4 IMPLANT
GUIDEPIN 3.2X17.5 THRD DISP (PIN) ×2 IMPLANT
GUIDEWIRE BALL NOSE 80CM (WIRE) ×2 IMPLANT
HIP FRAC NAIL LAG SCR 10.5X100 (Orthopedic Implant) ×1 IMPLANT
KIT BASIN OR (CUSTOM PROCEDURE TRAY) ×2 IMPLANT
KIT ROOM TURNOVER OR (KITS) ×2 IMPLANT
MANIFOLD NEPTUNE II (INSTRUMENTS) IMPLANT
NS IRRIG 1000ML POUR BTL (IV SOLUTION) ×2 IMPLANT
PACK GENERAL/GYN (CUSTOM PROCEDURE TRAY) ×2 IMPLANT
PAD ARMBOARD 7.5X6 YLW CONV (MISCELLANEOUS) ×4 IMPLANT
SCREW CANN THRD AFF 10.5X100 (Orthopedic Implant) ×1 IMPLANT
SCREW LAG HIP NAIL 11X320 (Screw) ×2 IMPLANT
SCREWDRIVER HEX TIP 3.5MM (MISCELLANEOUS) ×2 IMPLANT
STAPLER VISISTAT 35W (STAPLE) ×2 IMPLANT
SUT VIC AB 0 CT1 27 (SUTURE) ×2
SUT VIC AB 0 CT1 27XBRD ANBCTR (SUTURE) ×2 IMPLANT
SUT VIC AB 2-0 CT1 27 (SUTURE) ×2
SUT VIC AB 2-0 CT1 TAPERPNT 27 (SUTURE) ×2 IMPLANT
TOWEL OR 17X24 6PK STRL BLUE (TOWEL DISPOSABLE) ×2 IMPLANT
TOWEL OR 17X26 10 PK STRL BLUE (TOWEL DISPOSABLE) ×2 IMPLANT
WATER STERILE IRR 1000ML POUR (IV SOLUTION) ×2 IMPLANT

## 2012-11-13 NOTE — Brief Op Note (Signed)
Ellen Woods [id number] 11/13/2012   PRE-OP DIAGNOSIS: left IT hip fracture  POST-OP DIAGNOSIS: same  PROCEDURE: left hip trochanteric nail  ANESTHESIA: spinal and MAC  [name]   Dictation #:  [id number]    May be PWB with PT and will use ASA 325 bid for 4 weeks for DVT prophylaxis

## 2012-11-13 NOTE — Anesthesia Procedure Notes (Signed)
Spinal  Patient location during procedure: OR Start time: 11/13/2012 2:54 PM End time: 11/13/2012 3:02 PM Staffing Anesthesiologist: Gypsy Balsam, Starleen Trussell Performed by: anesthesiologist  Preanesthetic Checklist Completed: patient identified, site marked, surgical consent, pre-op evaluation, timeout performed, IV checked, risks and benefits discussed and monitors and equipment checked Spinal Block Patient position: left lateral decubitus Prep: Betadine Patient monitoring: heart rate, continuous pulse ox and blood pressure Location: L3-4 Injection technique: single-shot Needle Needle gauge: 22 G Assessment Sensory level: T10 Additional Notes 1454-1502 SAB L LD Position after IV sedation #22 spinal needle L3-4 with clear CSF No paresth Lido 5% w/epi 60mg  HOB 30 degree elevation BP drop treated with neo and fluids Level approx T 10 bilat No compl Dr Gypsy Balsam

## 2012-11-13 NOTE — H&P (View-Only) (Signed)
Reason for Consult:   Left hip fracture Referring Physician:    EDP [place] and medical team [place]  Ellen Woods is an 72 y.o. female  Who fell in the [employer] store today and could not get up. Was taken to [facility] and diagnosed with displaced IT hip fracture.  No ORS in [place] this weekend so transferred to [facility].  Admitted to medicine via hip fracture pathway and ORS consulted.  Medical issues most sig for CAD and IDDM.  Denies LOC and pain elsewhere.  Seen in room with two family members.    Past Medical History  Diagnosis Date  . Other dyspnea and respiratory abnormality     Resolved  . Unspecified essential hypertension   . Type II or unspecified type diabetes mellitus without mention of complication, not stated as uncontrolled   . Coronary atherosclerosis of native coronary artery     50% LAD stenosis  . Encounter for long-term (current) use of insulin   . Other symptoms involving cardiovascular system   . Pain in joint, pelvic region and thigh   . Shortness of breath   . Other chest pain     Atypical chest pain  . PDA (patent ductus arteriosus)     Small PDA documented by CT angiography, no pulmonary hypertension  . Pericardial calcification     Ruled out for restrictive pericarditis  . Thyroid disease   . Arthritis   . Back pain 1/14    Tx by orthopedics    Past Surgical History  Procedure Laterality Date  . Incisional hernia repair    . Abdominal hysterectomy    . Tubal ligation      Family History  Problem Relation Age of Onset  . Stroke Mother     Social History:  reports that she has never smoked. She has never used smokeless tobacco. She reports that she does not drink alcohol or use illicit drugs. Retired mill inspector  Allergies:  Allergies  Allergen Reactions  . Azithromycin Other (See Comments)    Hospital reaction  . Codeine     REACTION: nausea    Medications: I have reviewed the patient's current medications.  Results for orders placed  during the hospital encounter of 11/12/12 (from the past 48 hour(s))  CBC WITH DIFFERENTIAL     Status: Abnormal   Collection Time    11/12/12  2:20 PM      Result Value Range   WBC 10.4  4.0 - 10.5 K/uL   RBC 3.76 (*) 3.87 - 5.11 MIL/uL   Hemoglobin 12.5  12.0 - 15.0 g/dL   HCT 35.4 (*) 36.0 - 46.0 %   MCV 94.1  78.0 - 100.0 fL   MCH 33.2  26.0 - 34.0 pg   MCHC 35.3  30.0 - 36.0 g/dL   RDW 11.6  11.5 - 15.5 %   Platelets 218  150 - 400 K/uL   Neutrophils Relative % 81 (*) 43 - 77 %   Neutro Abs 8.4 (*) 1.7 - 7.7 K/uL   Lymphocytes Relative 12  12 - 46 %   Lymphs Abs 1.3  0.7 - 4.0 K/uL   Monocytes Relative 6  3 - 12 %   Monocytes Absolute 0.6  0.1 - 1.0 K/uL   Eosinophils Relative 0  0 - 5 %   Eosinophils Absolute 0.0  0.0 - 0.7 K/uL   Basophils Relative 0  0 - 1 %   Basophils Absolute 0.0  0.0 - 0.1 K/uL    COMPREHENSIVE METABOLIC PANEL     Status: Abnormal   Collection Time    11/12/12  2:20 PM      Result Value Range   Sodium 139  135 - 145 mEq/L   Potassium 4.4  3.5 - 5.1 mEq/L   Chloride 102  96 - 112 mEq/L   CO2 26  19 - 32 mEq/L   Glucose, Bld 84  70 - 99 mg/dL   BUN 22  6 - 23 mg/dL   Creatinine, Ser 0.89  0.50 - 1.10 mg/dL   Calcium 9.3  8.4 - 10.5 mg/dL   Total Protein 7.0  6.0 - 8.3 g/dL   Albumin 4.0  3.5 - 5.2 g/dL   AST 31  0 - 37 U/L   ALT 17  0 - 35 U/L   Alkaline Phosphatase 65  39 - 117 U/L   Total Bilirubin 0.7  0.3 - 1.2 mg/dL   GFR calc non Af Amer 64 (*) >90 mL/min   GFR calc Af Amer 74 (*) >90 mL/min   Comment:            The eGFR has been calculated     using the CKD EPI equation.     This calculation has not been     validated in all clinical     situations.     eGFR's persistently     <90 mL/min signify     possible Chronic Kidney Disease.  APTT     Status: None   Collection Time    11/12/12  2:20 PM      Result Value Range   aPTT 26  24 - 37 seconds  PROTIME-INR     Status: None   Collection Time    11/12/12  2:20 PM      Result  Value Range   Prothrombin Time 13.1  11.6 - 15.2 seconds   INR 1.01  0.00 - 1.49  GLUCOSE, CAPILLARY     Status: None   Collection Time    11/12/12  2:46 PM      Result Value Range   Glucose-Capillary 77  70 - 99 mg/dL  URINALYSIS, ROUTINE W REFLEX MICROSCOPIC     Status: None   Collection Time    11/12/12  3:45 PM      Result Value Range   Color, Urine YELLOW  YELLOW   APPearance CLEAR  CLEAR   Specific Gravity, Urine 1.025  1.005 - 1.030   pH 5.5  5.0 - 8.0   Glucose, UA NEGATIVE  NEGATIVE mg/dL   Hgb urine dipstick NEGATIVE  NEGATIVE   Bilirubin Urine NEGATIVE  NEGATIVE   Ketones, ur NEGATIVE  NEGATIVE mg/dL   Protein, ur NEGATIVE  NEGATIVE mg/dL   Urobilinogen, UA 0.2  0.0 - 1.0 mg/dL   Nitrite NEGATIVE  NEGATIVE   Leukocytes, UA NEGATIVE  NEGATIVE   Comment: MICROSCOPIC NOT DONE ON URINES WITH NEGATIVE PROTEIN, BLOOD, LEUKOCYTES, NITRITE, OR GLUCOSE <1000 mg/dL.  GLUCOSE, CAPILLARY     Status: Abnormal   Collection Time    11/12/12  5:28 PM      Result Value Range   Glucose-Capillary 147 (*) 70 - 99 mg/dL  GLUCOSE, CAPILLARY     Status: Abnormal   Collection Time    11/12/12  7:46 PM      Result Value Range   Glucose-Capillary 192 (*) 70 - 99 mg/dL  GLUCOSE, CAPILLARY     Status: Abnormal   Collection Time      11/12/12  9:30 PM      Result Value Range   Glucose-Capillary 162 (*) 70 - 99 mg/dL    Dg Chest 1 View  11/12/2012   *RADIOLOGY REPORT*  Clinical Data: Fall, left hip fracture  CHEST - 1 VIEW  Comparison: 10/20/2007  Findings: Borderline cardiomegaly.  No acute infiltrate or pleural effusion.  No pulmonary edema.  No diagnostic pneumothorax. Mild thoracic dextroscoliosis.  IMPRESSION: Borderline cardiomegaly.  No diagnostic pneumothorax.   Original Report Authenticated By: Liviu Pop, M.D.   Dg Hip Complete Left  11/12/2012   *RADIOLOGY REPORT*  Clinical Data: Fall  LEFT HIP - COMPLETE 2+ VIEW  Comparison: None.  Findings: Three views of the left hip submitted.   There is mild displaced intertrochanteric fracture of proximal left femur.  IMPRESSION: Mild displaced intertrochanteric fracture of proximal left femur.   Original Report Authenticated By: Liviu Pop, M.D.    @ROS@ Blood pressure 129/48, pulse 100, temperature 98.6 F (37 C), temperature source Oral, resp. rate 18, height 4' 11" (1.499 m), weight 55.792 kg (123 lb), SpO2 99.00%.  PHYSICAL EXAM:   Left leg SER, calf soft, CMS OK, other leg and both arms move fully,  Cspine moves well, head is atraumatic, RR, abd soft.  ASSESSMENT:   Left IT hip fracture  PLAN:   ORIF with troch nail after medical clearance.  Plan for tomorrow afternoon assuming medical team agrees.  Discussed with family. May eat now and NPO after 2am.     Kambra Beachem G 11/12/2012, 10:36 PM      

## 2012-11-13 NOTE — Anesthesia Postprocedure Evaluation (Signed)
  Anesthesia Post-op Note  Patient: Ellen Woods  Procedure(s) Performed: Procedure(s): INTRAMEDULLARY (IM) NAIL INTERTROCHANTRIC LEFT HIP (Left)  Patient Location: PACU  Anesthesia Type:Spinal  Level of Consciousness: awake and alert   Airway and Oxygen Therapy: Patient Spontanous Breathing  Post-op Pain: none  Post-op Assessment: Post-op Vital signs reviewed, Patient's Cardiovascular Status Stable, Respiratory Function Stable, Patent Airway, No signs of Nausea or vomiting, Pain level controlled and No residual numbness  Post-op Vital Signs: stable  Complications: No apparent anesthesia complications

## 2012-11-13 NOTE — Preoperative (Signed)
Beta Blockers   Reason not to administer Beta Blockers:Not Applicable 

## 2012-11-13 NOTE — Transfer of Care (Signed)
Immediate Anesthesia Transfer of Care Note  Patient: Ellen Woods  Procedure(s) Performed: Procedure(s): INTRAMEDULLARY (IM) NAIL INTERTROCHANTRIC LEFT HIP (Left)  Patient Location: PACU  Anesthesia Type:Spinal  Level of Consciousness: awake, alert , oriented and sedated  Airway & Oxygen Therapy: Patient Spontanous Breathing and Patient connected to nasal cannula oxygen  Post-op Assessment: Report given to PACU RN and Post -op Vital signs reviewed and stable  Post vital signs: Reviewed and stable  Complications: No apparent anesthesia complications

## 2012-11-13 NOTE — Op Note (Signed)
NAMENOORA, LOCASCIO              ACCOUNT NO.:  [id number]  MEDICAL RECORD NO.:  [id number]  LOCATION:  [facility]                         FACILITY:  [facility]  PHYSICIAN:  Lubertha Basque. Sheyann Sulton, M.D.DATE OF BIRTH:  1940-06-19  DATE OF PROCEDURE:  11/13/2012 DATE OF DISCHARGE:                              OPERATIVE REPORT   PREOPERATIVE DIAGNOSIS:  Left hip intertrochanteric fracture.  POSTOPERATIVE DIAGNOSIS:  Left hip intertrochanteric fracture.  PROCEDURE:  Left hip trochanteric nail.  ANESTHESIA:  Spinal and MAC.  ATTENDING SURGEON:  Lubertha Basque. Georgia Delsignore, MD  INDICATION FOR PROCEDURE:  The patient is a 72 year old woman, who fell yesterday up in [place] and was taken to [facility].  She was diagnosed with a displaced intertrochanteric fracture and with no orthopedics coverage there was transferred here last night.  She was admitted to the medical team and cleared for surgical intervention to consist of ORIF of this fracture in hopes of allowing her to sit and stand and potentially walk.  Informed operative consent was obtained after discussion of possible complications including reaction to anesthesia, infection, malunion, DVT, and death.  SUMMARY OF FINDINGS AND PROCEDURE:  Under spinal anesthesia, through some small incisions, we stabilized a left hip intertrochanteric fracture with an Affixus nail which measured 11 x 320 in length and size.  It was locked proximally with 1 large screw and distally with 1 screw.  I used fluoroscopy throughout the case to make appropriate intraoperative decisions and I read all of these views myself.  DESCRIPTION OF PROCEDURE:  The patient was taken to the operating suite, where spinal anesthetic was applied without difficulty.  She was also given some sedation.  She was positioned supine on the fracture table followed by reduction of her fracture with some traction and abduction. She was then prepped and draped in normal sterile fashion.   After the administration of IV Kefzol and appropriate time-out, a small incision was made just above the greater trochanter.  Dissection was carried down to the tip of the greater trochanter.  I introduced intramedullary guidewire from this point which was confirmed to be intramedullary in 2 views distally.  We then over reamed to a diameter of 13 mm with a reamer.  We then placed the guidewire down to the knee, seen to be intramedullary on 2 views.  We then over reamed this to a diameter of 13 mm along the shaft which was actually fairly tight.  We then placed the aforementioned Affixus nail.  I made a separate incision and placed a guidewire through the nail up into the femoral head.  This was reamed and then I placed a locking screw there central in the femoral head. She had excellent bone quality again here.  We then isolated the static locking hole distally and placed a single locking screw here with freehand technique.  All the hardware was confirmed to be in good position by fluoroscopy and 2 planes.  The wounds were then irrigated followed by reapproximation of deep tissues with Vicryl and skin with staples.  Adaptic was applied followed by some Band-Aids.  Estimated blood loss and fluids obtained from anesthesia records.  DISPOSITION:  The patient was taken to recovery  room in stable condition.  She was to be admitted back to the Medicine Service for appropriate postop care to include perioperative antibiotics and aspirin for DVT prophylaxis.     Lubertha Basque Jerl Santos, M.D.     PGD/MEDQ  D:  11/13/2012  T:  11/13/2012  Job:  [id number]

## 2012-11-13 NOTE — Progress Notes (Signed)
[facility] PROGRESS NOTE  Ellen Woods EXB:[id number] DOB: 16-Jan-1941 DOA: 11/12/2012 PCP: Rudi Heap, MD  Assessment/Plan: 1. Lip hip fracture- for ORIF today 2. DM- SSI 3. HLD- home meds 4. HTN- home meds 5. CAD- stable, echo pending  Code Status: full Family Communication: patient and family at bedside Disposition Plan: PT eval after surgery   Consultants:  ortho  Procedures:    Antibiotics:    HPI/Subjective: Asking for ice chips- wants surgery done  Objective: Filed Vitals:   11/12/12 1900 11/12/12 2123 11/13/12 0206 11/13/12 0643  BP: 103/77 129/48 117/49 110/58  Pulse:  100 102 90  Temp:  98.6 F (37 C) 98.6 F (37 C) 98.8 F (37.1 C)  TempSrc:  Oral Oral Oral  Resp: 18 18 18 16   Height:      Weight:      SpO2:  99% 100% 99%    Intake/Output Summary (Last 24 hours) at 11/13/12 1136 Last data filed at 11/13/12 0602  Gross per 24 hour  Intake    860 ml  Output    200 ml  Net    660 ml   Filed Weights   11/12/12 1257  Weight: 55.792 kg (123 lb)    Exam:   General:  A+Ox3, NAD  Cardiovascular: rrr  Respiratory: clear anterior  Abdomen: +Bs,soft, NT  Musculoskeletal: pain in hip   Data Reviewed: Basic Metabolic Panel:  Recent Labs Lab 11/12/12 1420 11/13/12 0017  NA 139  --   K 4.4  --   CL 102  --   CO2 26  --   GLUCOSE 84  --   BUN 22  --   CREATININE 0.89 0.86  CALCIUM 9.3  --    Liver Function Tests:  Recent Labs Lab 11/12/12 1420  AST 31  ALT 17  ALKPHOS 65  BILITOT 0.7  PROT 7.0  ALBUMIN 4.0   No results found for this basename: LIPASE, AMYLASE,  in the last 168 hours No results found for this basename: AMMONIA,  in the last 168 hours CBC:  Recent Labs Lab 11/12/12 1420 11/13/12 0017  WBC 10.4 6.6  NEUTROABS 8.4*  --   HGB 12.5 10.3*  HCT 35.4* 30.5*  MCV 94.1 95.6  PLT 218 180   Cardiac Enzymes: No results found for this basename: CKTOTAL, CKMB, CKMBINDEX, TROPONINI,  in the last  168 hours BNP (last 3 results) No results found for this basename: PROBNP,  in the last 8760 hours CBG:  Recent Labs Lab 11/12/12 1946 11/12/12 2130 11/13/12 0012 11/13/12 0408 11/13/12 1012  GLUCAP 192* 162* 166* 148* 175*    Recent Results (from the past 240 hour(s))  SURGICAL PCR SCREEN     Status: None   Collection Time    11/13/12  4:14 AM      Result Value Range Status   MRSA, PCR NEGATIVE  NEGATIVE Final   Staphylococcus aureus NEGATIVE  NEGATIVE Final   Comment:            The Xpert SA Assay (FDA     approved for NASAL specimens     in patients over 2 years of age),     is one component of     a comprehensive surveillance     program.  Test performance has     been validated by [employer] for patients greater     than or equal to 60 year old.     It is  not intended     to diagnose infection nor to     guide or monitor treatment.     Studies: Dg Chest 1 View  11/12/2012   *RADIOLOGY REPORT*  Clinical Data: Fall, left hip fracture  CHEST - 1 VIEW  Comparison: 10/20/2007  Findings: Borderline cardiomegaly.  No acute infiltrate or pleural effusion.  No pulmonary edema.  No diagnostic pneumothorax. Mild thoracic dextroscoliosis.  IMPRESSION: Borderline cardiomegaly.  No diagnostic pneumothorax.   Original Report Authenticated By: Natasha Mead, M.D.   Dg Hip Complete Left  11/12/2012   *RADIOLOGY REPORT*  Clinical Data: Fall  LEFT HIP - COMPLETE 2+ VIEW  Comparison: None.  Findings: Three views of the left hip submitted.  There is mild displaced intertrochanteric fracture of proximal left femur.  IMPRESSION: Mild displaced intertrochanteric fracture of proximal left femur.   Original Report Authenticated By: Natasha Mead, M.D.    Scheduled Meds: . ALPRAZolam  0.5 mg Oral BID  . aspirin EC  325 mg Oral Daily  . docusate sodium  100 mg Oral BID  . heparin  5,000 Units Subcutaneous Q8H  . insulin aspart  0-15 Units Subcutaneous Q4H  . levothyroxine  50 mcg Oral QAC  breakfast  . lisinopril  5 mg Oral Daily  . loratadine  10 mg Oral Daily  . metoprolol tartrate  25 mg Oral BID  . potassium chloride SA  20 mEq Oral Daily  . simvastatin  40 mg Oral QPM  . sodium chloride  3 mL Intravenous Q12H   Continuous Infusions: . dextrose 5 % and 0.9% NaCl 75 mL/hr at 11/13/12 0027    Active Problems:   DM   HYPERLIPIDEMIA   Essential hypertension, benign   DYSPNEA   Coronary atherosclerosis of native coronary artery   PAF (paroxysmal atrial fibrillation)   Fx intertrochanteric hip    Time spent: 35    [name], Kinda Pottle  [facility] Pager [phone]. If 7PM-7AM, please contact night-coverage at [email or web address], password [facility] 11/13/2012, 11:36 AM  LOS: 1 day

## 2012-11-13 NOTE — Interval H&P Note (Signed)
History and Physical Interval Note:  11/13/2012 2:35 PM  Ellen Woods  has presented today for surgery, with the diagnosis of left intertrochanteric fracture  The various methods of treatment have been discussed with the patient and family. After consideration of risks, benefits and other options for treatment, the patient has consented to  Procedure(s): INTRAMEDULLARY (IM) NAIL INTERTROCHANTRIC LEFT HIP (Left) as a surgical intervention .  The patient's history has been reviewed, patient examined, no change in status, stable for surgery.  I have reviewed the patient's chart and labs.  Questions were answered to the patient's satisfaction.     Aspen Deterding G

## 2012-11-13 NOTE — H&P (Signed)
[facility] History and Physical  MACKENZIE GROOM WUJ:[id number] DOB: 12/23/1940    PCP:   Rudi Heap, MD   Chief Complaint: left hip fracture.  HPI: Ellen Woods is an 72 y.o. female with hx of CAD (last cath 2012 with 50 percent LAD, DM, Hypothyroidism, baseline DOE, fell and suffered a left hip Fx.  She was seen in consultation with Ortho and has planned to have ORIF tomorrow at 13:00 hour.  She denied chest pain, palpitation, fever or chills.  She does have DOE, but this is not new, and she has been able to work on her yard for several hours.  Work up included no leukocytosis, unremarkable renal fx tests, and electrolytes.  Her BS was 22 orginally, and she was given D10, which was later discontinued.  Hospitalist was asked to admit her for hip Fx.  Rewiew of Systems:  Constitutional: Negative for malaise, fever and chills. No significant weight loss or weight gain Eyes: Negative for eye pain, redness and discharge, diplopia, visual changes, or flashes of light. ENMT: Negative for ear pain, hoarseness, nasal congestion, sinus pressure and sore throat. No headaches; tinnitus, drooling, or problem swallowing. Cardiovascular: Negative for chest pain, palpitations, diaphoresis, dyspnea and peripheral edema. ; No orthopnea, PND Respiratory: Negative for cough, hemoptysis, wheezing and stridor. No pleuritic chestpain. Gastrointestinal: Negative for nausea, vomiting, diarrhea, constipation, abdominal pain, melena, blood in stool, hematemesis, jaundice and rectal bleeding.    Genitourinary: Negative for frequency, dysuria, incontinence,flank pain and hematuria; Musculoskeletal: Negative for back pain and neck pain.  Skin: . Negative for pruritus, rash, abrasions, bruising and skin lesion.; ulcerations Neuro: Negative for headache, lightheadedness and neck stiffness. Negative for weakness, altered level of consciousness , altered mental status, extremity weakness, burning feet,  involuntary movement, seizure and syncope.  Psych: negative for anxiety, depression, insomnia, tearfulness, panic attacks, hallucinations, paranoia, suicidal or homicidal ideation    Past Medical History  Diagnosis Date  . Other dyspnea and respiratory abnormality     Resolved  . Unspecified essential hypertension   . Type II or unspecified type diabetes mellitus without mention of complication, not stated as uncontrolled   . Coronary atherosclerosis of native coronary artery     50% LAD stenosis  . Encounter for long-term (current) use of insulin   . Other symptoms involving cardiovascular system   . Pain in joint, pelvic region and thigh   . Shortness of breath   . Other chest pain     Atypical chest pain  . PDA (patent ductus arteriosus)     Small PDA documented by CT angiography, no pulmonary hypertension  . Pericardial calcification     Ruled out for restrictive pericarditis  . Thyroid disease   . Arthritis   . Back pain 1/14    Tx by orthopedics    Past Surgical History  Procedure Laterality Date  . Incisional hernia repair    . Abdominal hysterectomy    . Tubal ligation      Medications:  HOME MEDS: Prior to Admission medications   Medication Sig Start Date End Date Taking? Authorizing Provider  alendronate (FOSAMAX) 70 MG tablet Take 70 mg by mouth every 7 (seven) days. Take with a full glass of water on an empty stomach. 10/25/12  Yes Deatra Canter, FNP  ALPRAZolam Prudy Feeler) 0.5 MG tablet Take 1 tablet (0.5 mg total) by mouth 2 (two) times daily. 10/25/12  Yes Deatra Canter, FNP  aspirin EC 325 MG tablet Take 1 tablet (325  mg total) by mouth daily. 03/15/12  Yes Prescott Parma, PA-C  baclofen (LIORESAL) 10 MG tablet Take 1 tablet (10 mg total) by mouth 3 (three) times daily. 10/25/12  Yes Deatra Canter, FNP  cetirizine (ZYRTEC) 10 MG tablet Take 1 tablet (10 mg total) by mouth daily. 10/25/12  Yes Deatra Canter, FNP  furosemide (LASIX) 20 MG tablet Take 1  tablet (20 mg total) by mouth daily. 10/25/12  Yes Deatra Canter, FNP  insulin glargine (LANTUS) 100 UNIT/ML injection Inject 0.26 mLs (26 Units total) into the skin every morning. 11/08/12  Yes Deatra Canter, FNP  Insulin Pen Needle (B-D UF III MINI PEN NEEDLES) 31G X 5 MM MISC 1 Stick by Does not apply route QID. 10/25/12  Yes Deatra Canter, FNP  insulin regular (NOVOLIN R,HUMULIN R) 100 units/mL injection Inject into the skin 3 (three) times daily before meals. Sliding scale   Yes Historical Provider, MD  levothyroxine (SYNTHROID, LEVOTHROID) 50 MCG tablet Take 1 tablet (50 mcg total) by mouth daily. 10/25/12  Yes Deatra Canter, FNP  lisinopril (PRINIVIL,ZESTRIL) 5 MG tablet Take 1 tablet (5 mg total) by mouth daily. 10/25/12  Yes Deatra Canter, FNP  metoprolol tartrate (LOPRESSOR) 25 MG tablet  07/15/11  Yes June Leap, MD  Multiple Vitamin (MULTIVITAMIN) tablet Take 1 tablet by mouth daily.   Yes Historical Provider, MD  potassium chloride SA (K-DUR,KLOR-CON) 20 MEQ tablet Take 1 tablet (20 mEq total) by mouth daily. 10/07/12  Yes Mary-Margaret Daphine Deutscher, FNP  PRODIGY NO CODING BLOOD GLUC test strip  05/31/12  Yes Historical Provider, MD  simvastatin (ZOCOR) 40 MG tablet Take 1 tablet (40 mg total) by mouth every evening. 10/07/12  Yes Mary-Margaret Daphine Deutscher, FNP  vitamin B-12 (CYANOCOBALAMIN) 1000 MCG tablet Take 1 tablet (1,000 mcg total) by mouth daily. 10/25/12  Yes Deatra Canter, FNP  NITROSTAT 0.4 MG SL tablet Place 1 tablet (0.4 mg total) under the tongue every 5 (five) minutes as needed for chest pain. 09/08/12   Mary-Margaret Daphine Deutscher, FNP     Allergies:  Allergies  Allergen Reactions  . Azithromycin Other (See Comments)    Hospital reaction  . Codeine     REACTION: nausea    Social History:   reports that she has never smoked. She has never used smokeless tobacco. She reports that she does not drink alcohol or use illicit drugs.  Family History: Family History  Problem  Relation Age of Onset  . Stroke Mother      Physical Exam: Filed Vitals:   11/12/12 1800 11/12/12 1845 11/12/12 1900 11/12/12 2123  BP: 141/55  103/77 129/48  Pulse:    100  Temp:    98.6 F (37 C)  TempSrc:    Oral  Resp: 18 20 18 18   Height:      Weight:      SpO2:    99%   Blood pressure 129/48, pulse 100, temperature 98.6 F (37 C), temperature source Oral, resp. rate 18, height 4\' 11"  (1.499 m), weight 55.792 kg (123 lb), SpO2 99.00%.  GEN:  Pleasant  patient lying in the stretcher in no acute distress; cooperative with exam. PSYCH:  alert and oriented x4; does not appear anxious or depressed; affect is appropriate. HEENT: Mucous membranes pink and anicteric; PERRLA; EOM intact; no cervical lymphadenopathy nor thyromegaly or carotid bruit; no JVD; There were no stridor. Neck is very supple. Breasts:: Not examined CHEST WALL: No tenderness CHEST: Normal respiration,  clear to auscultation bilaterally.  HEART: Regular rate and rhythm.  There are no murmur, rub, or gallops.   BACK: No kyphosis or scoliosis; no CVA tenderness ABDOMEN: soft and non-tender; no masses, no organomegaly, normal abdominal bowel sounds; no pannus; no intertriginous candida. There is no rebound and no distention. Rectal Exam: Not done EXTREMITIES: No bone or joint deformity; age-appropriate arthropathy of the hands and knees; no edema; no ulcerations.  There is no calf tenderness. Her left leg is clearly shorter and externally rotated. Genitalia: not examined PULSES: 2+ and symmetric SKIN: Normal hydration no rash or ulceration CNS: Cranial nerves 2-12 grossly intact no focal lateralizing neurologic deficit.  Speech is fluent; uvula elevated with phonation, facial symmetry and tongue midline. DTR are normal bilaterally, cerebella exam is intact, barbinski is negative and strengths are equaled bilaterally.  No sensory loss.   Labs on Admission:  Basic Metabolic Panel:  Recent Labs Lab 11/12/12 1420  11/13/12 0017  NA 139  --   K 4.4  --   CL 102  --   CO2 26  --   GLUCOSE 84  --   BUN 22  --   CREATININE 0.89 0.86  CALCIUM 9.3  --    Liver Function Tests:  Recent Labs Lab 11/12/12 1420  AST 31  ALT 17  ALKPHOS 65  BILITOT 0.7  PROT 7.0  ALBUMIN 4.0   No results found for this basename: LIPASE, AMYLASE,  in the last 168 hours No results found for this basename: AMMONIA,  in the last 168 hours CBC:  Recent Labs Lab 11/12/12 1420 11/13/12 0017  WBC 10.4 6.6  NEUTROABS 8.4*  --   HGB 12.5 10.3*  HCT 35.4* 30.5*  MCV 94.1 95.6  PLT 218 180   Cardiac Enzymes: No results found for this basename: CKTOTAL, CKMB, CKMBINDEX, TROPONINI,  in the last 168 hours  CBG:  Recent Labs Lab 11/12/12 1446 11/12/12 1728 11/12/12 1946 11/12/12 2130 11/13/12 0012  GLUCAP 77 147* 192* 162* 166*     Radiological Exams on Admission: Dg Chest 1 View  11/12/2012   *RADIOLOGY REPORT*  Clinical Data: Fall, left hip fracture  CHEST - 1 VIEW  Comparison: 10/20/2007  Findings: Borderline cardiomegaly.  No acute infiltrate or pleural effusion.  No pulmonary edema.  No diagnostic pneumothorax. Mild thoracic dextroscoliosis.  IMPRESSION: Borderline cardiomegaly.  No diagnostic pneumothorax.   Original Report Authenticated By: Natasha Mead, M.D.   Dg Hip Complete Left  11/12/2012   *RADIOLOGY REPORT*  Clinical Data: Fall  LEFT HIP - COMPLETE 2+ VIEW  Comparison: None.  Findings: Three views of the left hip submitted.  There is mild displaced intertrochanteric fracture of proximal left femur.  IMPRESSION: Mild displaced intertrochanteric fracture of proximal left femur.   Original Report Authenticated By: Natasha Mead, M.D.    EKG: Independently reviewed. NSR with no acute ST-T changes.     Assessment/Plan Present on Admission:  . Coronary atherosclerosis of native coronary artery . HYPERLIPIDEMIA . Essential hypertension, benign . PAF (paroxysmal atrial fibrillation) . DM .  DYSPNEA  PLAN:  Will admit her for left hip Fx requiring surgical repair.  Her CAD is stable, and she doesn't have any exertional chest pain.  I will obtain an ECHO, but this shouldn't hold up her surgery.  Accepting an increase risk of perioperative cardiovascular event, she is clear for surgery.  I will give her one dose of subQ heparin for DVT prophylaxis.  For her DM, will continue with  SSI.  Since she is NPO, will give her D5NS also.  It is very important to discontinue her fosamax for proper bone healing during this repair.  She is stable, full code, and will be admitted to [facility] service. Thank you for allowing me to partake in the care of this nice patient.  Other plans as per orders.  Code Status: FULL Unk Lightning, MD. [facility] Pager [phone] 7pm to 7am.  11/13/2012, 2:02 AM

## 2012-11-13 NOTE — Anesthesia Preprocedure Evaluation (Signed)
Anesthesia Evaluation  Patient identified by MRN, date of birth, ID band Patient awake    Reviewed: Allergy & Precautions, H&P , NPO status , Patient's Chart, lab work & pertinent test results  Airway Mallampati: I TM Distance: <3 FB Neck ROM: Full    Dental   Pulmonary shortness of breath,  breath sounds clear to auscultation        Cardiovascular hypertension, + CAD + dysrhythmias Atrial Fibrillation Rhythm:Regular Rate:Normal  PDA   Neuro/Psych    GI/Hepatic dysphagia   Endo/Other  diabetes  Renal/GU      Musculoskeletal   Abdominal   Peds  Hematology   Anesthesia Other Findings   Reproductive/Obstetrics                           Anesthesia Physical Anesthesia Plan  ASA: III  Anesthesia Plan: Spinal   Post-op Pain Management:    Induction: Intravenous  Airway Management Planned: Simple Face Mask  Additional Equipment:   Intra-op Plan:   Post-operative Plan:   Informed Consent: I have reviewed the patients History and Physical, chart, labs and discussed the procedure including the risks, benefits and alternatives for the proposed anesthesia with the patient or authorized representative who has indicated his/her understanding and acceptance.     Plan Discussed with: CRNA and Surgeon  Anesthesia Plan Comments:         Anesthesia Quick Evaluation

## 2012-11-14 LAB — BASIC METABOLIC PANEL
Chloride: 102 mEq/L (ref 96–112)
Creatinine, Ser: 0.86 mg/dL (ref 0.50–1.10)
GFR calc Af Amer: 77 mL/min — ABNORMAL LOW (ref >90)
Potassium: 3.8 mEq/L (ref 3.5–5.1)
Sodium: 139 mEq/L (ref 135–145)

## 2012-11-14 LAB — CBC
MCV: 97.2 fL (ref 78.0–100.0)
Platelets: 162 10*3/uL (ref 150–400)
RDW: 12.5 % (ref 11.5–15.5)
WBC: 5.2 10*3/uL (ref 4.0–10.5)

## 2012-11-14 MED ORDER — ASPIRIN EC 325 MG PO TBEC: 325.0000 mg | DELAYED_RELEASE_TABLET | Freq: Two times a day (BID) | ORAL | Status: DC

## 2012-11-14 MED ORDER — INSULIN ASPART 100 UNIT/ML ~~LOC~~ SOLN: 0.0000 [IU] | Freq: Every day | SUBCUTANEOUS | Status: DC

## 2012-11-14 MED ORDER — HYDROCODONE-ACETAMINOPHEN 5-325 MG PO TABS: 1.0000 | ORAL_TABLET | Freq: Four times a day (QID) | ORAL | Status: DC | PRN

## 2012-11-14 MED ORDER — INSULIN ASPART 100 UNIT/ML ~~LOC~~ SOLN
0.0000 [IU] | Freq: Three times a day (TID) | SUBCUTANEOUS | Status: DC
  Administered 2012-11-14: 11 [IU] via SUBCUTANEOUS
  Administered 2012-11-14: 5 [IU] via SUBCUTANEOUS
  Administered 2012-11-15: 3 [IU] via SUBCUTANEOUS
  Administered 2012-11-15 (×2): 8 [IU] via SUBCUTANEOUS
  Administered 2012-11-16: 5 [IU] via SUBCUTANEOUS
  Administered 2012-11-16: 3 [IU] via SUBCUTANEOUS
  Administered 2012-11-16: 15 [IU] via SUBCUTANEOUS
  Administered 2012-11-17 (×2): 5 [IU] via SUBCUTANEOUS
  Administered 2012-11-17: 3 [IU] via SUBCUTANEOUS
  Administered 2012-11-18: 5 [IU] via SUBCUTANEOUS
  Administered 2012-11-18: 3 [IU] via SUBCUTANEOUS

## 2012-11-14 MED ORDER — MORPHINE SULFATE 2 MG/ML IJ SOLN
1.0000 mg | INTRAMUSCULAR | Status: DC | PRN
Start: 2012-11-14 — End: 2012-11-15

## 2012-11-14 MED ORDER — METOPROLOL TARTRATE 25 MG PO TABS
25.0000 mg | ORAL_TABLET | Freq: Two times a day (BID) | ORAL | Status: DC
  Administered 2012-11-14 – 2012-11-15 (×2): 25 mg via ORAL
  Filled 2012-11-14 (×4): qty 1

## 2012-11-14 NOTE — Progress Notes (Signed)
Subjective: 1 Day Post-Op Procedure(s) (LRB): INTRAMEDULLARY (IM) NAIL INTERTROCHANTRIC LEFT HIP (Left) Blood loss expected from surgery. Patient has already been up and walking in her room. Partial weightbearing left. Activity level:  Partial weightbearing left leg Diet tolerance:  ok Voiding:  ok Patient reports pain as 3 on 0-10 scale.    Objective: Vital signs in last 24 hours: Temp:  [98.2 F (36.8 C)-99.9 F (37.7 C)] 99.6 F (37.6 C) (07/13 5409) Pulse Rate:  [65-102] 75 (07/13 0632) Resp:  [12-19] 15 (07/13 8119) BP: (89-138)/(42-53) 89/51 mmHg (07/13 0632) SpO2:  [97 %-100 %] 100 % (07/13 0632)  Labs:  Recent Labs  11/12/12 1420 11/13/12 0017 11/14/12 0540  HGB 12.5 10.3* 9.0*    Recent Labs  11/13/12 0017 11/14/12 0540  WBC 6.6 5.2  RBC 3.19* 2.83*  HCT 30.5* 27.5*  PLT 180 162    Recent Labs  11/12/12 1420 11/13/12 0017 11/14/12 0540  NA 139  --  139  K 4.4  --  3.8  CL 102  --  102  CO2 26  --  26  BUN 22  --  17  CREATININE 0.89 0.86 0.86  GLUCOSE 84  --  103*  CALCIUM 9.3  --  8.2*    Recent Labs  11/12/12 1420  INR 1.01    Physical Exam:  Neurologically intact ABD soft Neurovascular intact Sensation intact distally Intact pulses distally Dorsiflexion/Plantar flexion intact Incision: dressing C/D/I No cellulitis present Compartment soft  Assessment/Plan:  1 Day Post-Op Procedure(s) (LRB): INTRAMEDULLARY (IM) NAIL INTERTROCHANTRIC LEFT HIP (Left) Advance diet Up with therapy partial weightbearing left side. Continue ASA 325. 1 pill twice a day x4 weeks. Home discharge versus skilled nursing facility in 1-2 days. Return to office Dr. Jerl Santos in 2 weeks. Prescriptions for pain medicine and ASA enteric coated on chart.    Trina Asch R 11/14/2012, 12:25 PM

## 2012-11-14 NOTE — Evaluation (Signed)
Physical Therapy Evaluation Patient Details Name: Ellen Woods MRN: [id number] DOB: [birth year] Today's Date: 11/14/2012 Time: 4098-1191 PT Time Calculation (min): 20 min  PT Assessment / Plan / Recommendation History of Present Illness  Pt sustained L femur fx from a fall in [place].  Daughter feels the fall was the result of pt having low blood sugar.  She underwent IM nailing yesterday, 11-13-12.  Clinical Impression  Patient is s/p IM nailing LLE resulting in the deficits listed below (see PT Problem List). She lives at home with family who can provide 24 hour assist.  Patient will benefit from skilled PT to increase their independence and safety with mobility (while adhering to their precautions) to allow discharge home.  From a PT standpoint, she should be ready for d/c home in 1-2 days.     PT Assessment  Patient needs continued PT services    Follow Up Recommendations  Home health PT;Supervision/Assistance - 24 hour    Does the patient have the potential to tolerate intense rehabilitation      Barriers to Discharge        Equipment Recommendations  Rolling walker with 5" wheels    Recommendations for Other Services     Frequency Min 6X/week    Precautions / Restrictions Precautions Precautions: Fall Restrictions Weight Bearing Restrictions: Yes LLE Weight Bearing: Partial weight bearing LLE Partial Weight Bearing Percentage or Pounds: 30%   Pertinent Vitals/Pain 4/10      Mobility  Bed Mobility Bed Mobility: Supine to Sit Supine to Sit: 3: Mod assist Sitting - Scoot to Edge of Bed: 3: Mod assist Details for Bed Mobility Assistance: verbal/tactile cues for sequencing Transfers Transfers: Sit to Stand;Stand to Sit Sit to Stand: 3: Mod assist;From bed;With upper extremity assist Stand to Sit: 4: Min assist;To chair/3-in-1;With armrests Details for Transfer Assistance: verbal cues for sequencing, hand placement Ambulation/Gait Ambulation/Gait Assistance: 3:  Mod assist Ambulation Distance (Feet): 15 Feet Assistive device: Rolling walker Ambulation/Gait Assistance Details: assist with RW management, verbal cues for sequencing, WB status Gait Pattern: Step-to pattern;Antalgic Gait velocity: decreased    Exercises     PT Diagnosis: Difficulty walking;Acute pain  PT Problem List: Decreased strength;Decreased activity tolerance;Decreased mobility;Decreased balance;Decreased knowledge of use of DME;Decreased knowledge of precautions;Pain PT Treatment Interventions: DME instruction;Gait training;Stair training;Functional mobility training;Therapeutic activities;Patient/family education;Balance training;Therapeutic exercise     PT Goals(Current goals can be found in the care plan section) Acute Rehab PT Goals Patient Stated Goal: Return home. PT Goal Formulation: With patient Time For Goal Achievement: 11/21/12 Potential to Achieve Goals: Good  Visit Information  Last PT Received On: 11/14/12 Assistance Needed: +1 PT/OT Co-Evaluation/Treatment: Yes History of Present Illness: Pt sustained L femur fx from a fall in [place].  Daughter feels the fall was the result of pt having low blood sugar.  She underwent IM nailing yesterday, 11-13-12.       Prior Functioning  Home Living Family/patient expects to be discharged to:: Private residence Living Arrangements: Children Available Help at Discharge: Family;Available 24 hours/day Type of Home: Mobile home Home Access: Stairs to enter Entrance Stairs-Number of Steps: 5 Entrance Stairs-Rails: Right;Left Home Layout: One level Home Equipment: None Prior Function Level of Independence: Independent Communication Communication: No difficulties Dominant Hand: Right    Cognition  Cognition Arousal/Alertness: Awake/alert Behavior During Therapy: WFL for tasks assessed/performed Overall Cognitive Status: Within Functional Limits for tasks assessed    Extremity/Trunk Assessment Upper Extremity  Assessment Upper Extremity Assessment: Overall WFL for tasks assessed   Balance  End of Session PT - End of Session Equipment Utilized During Treatment: Gait belt Activity Tolerance: Patient tolerated treatment well Patient left: in chair;with call bell/phone within reach;with family/visitor present Nurse Communication: Mobility status  GP     Ilda Foil 11/14/2012, 11:09 AM  Aida Raider, PT  Office # [phone] Pager [phone]

## 2012-11-14 NOTE — Progress Notes (Signed)
CSW received consult for SNF placement, note plan now is for d/c home in 1-2 days with home health. No CSW needs identified - CSW signing off.   Unice Bailey, LCSW  Clinical Social Worker  cell #: [phone]  (weekend coverage)

## 2012-11-14 NOTE — Progress Notes (Signed)
[facility] PROGRESS NOTE  Ellen Woods ZHY:[id number] DOB: 04/10/1941 DOA: 11/12/2012 PCP: Rudi Heap, MD  Assessment/Plan: 1. Left hip fracture- s/p ORIF; ortho consult, PT/OT, incentive spirometry 2. DM- SSI 3. HLD- home meds 4. HTN- home meds with holding parameters 5. CAD- stable, echo pending 6. ABLA- follow and transfuse as needed  D/c foley  Code Status: full Family Communication: patient and family at bedside Disposition Plan: PT eval    Consultants:  ortho  Procedures:  ORIF 7/12  Antibiotics:    HPI/Subjective: Feeling well, no new c/o-- ready to get up and moving  Objective: Filed Vitals:   11/13/12 2030 11/13/12 2130 11/13/12 2230 11/14/12 0632  BP: 129/49 96/44 111/43 89/51  Pulse: 97 102 101 75  Temp: 99.5 F (37.5 C) 99.5 F (37.5 C) 99.1 F (37.3 C) 99.6 F (37.6 C)  TempSrc: Oral Oral Oral Oral  Resp: 16 16 16 15   Height:      Weight:      SpO2: 100% 100% 100% 100%    Intake/Output Summary (Last 24 hours) at 11/14/12 0932 Last data filed at 11/14/12 0700  Gross per 24 hour  Intake   1340 ml  Output    450 ml  Net    890 ml   Filed Weights   11/12/12 1257  Weight: 55.792 kg (123 lb)    Exam:   General:  A+Ox3, NAD  Cardiovascular: rrr  Respiratory: clear anterior  Abdomen: +Bs,soft, NT  Musculoskeletal: pain in hip   Data Reviewed: Basic Metabolic Panel:  Recent Labs Lab 11/12/12 1420 11/13/12 0017 11/14/12 0540  NA 139  --  139  K 4.4  --  3.8  CL 102  --  102  CO2 26  --  26  GLUCOSE 84  --  103*  BUN 22  --  17  CREATININE 0.89 0.86 0.86  CALCIUM 9.3  --  8.2*   Liver Function Tests:  Recent Labs Lab 11/12/12 1420  AST 31  ALT 17  ALKPHOS 65  BILITOT 0.7  PROT 7.0  ALBUMIN 4.0   No results found for this basename: LIPASE, AMYLASE,  in the last 168 hours No results found for this basename: AMMONIA,  in the last 168 hours CBC:  Recent Labs Lab 11/12/12 1420 11/13/12 0017  11/14/12 0540  WBC 10.4 6.6 5.2  NEUTROABS 8.4*  --   --   HGB 12.5 10.3* 9.0*  HCT 35.4* 30.5* 27.5*  MCV 94.1 95.6 97.2  PLT 218 180 162   Cardiac Enzymes: No results found for this basename: CKTOTAL, CKMB, CKMBINDEX, TROPONINI,  in the last 168 hours BNP (last 3 results) No results found for this basename: PROBNP,  in the last 8760 hours CBG:  Recent Labs Lab 11/13/12 1012 11/13/12 1637 11/13/12 2104 11/14/12 0105 11/14/12 0631  GLUCAP 175* 156* 244* 199* 114*    Recent Results (from the past 240 hour(s))  SURGICAL PCR SCREEN     Status: None   Collection Time    11/13/12  4:14 AM      Result Value Range Status   MRSA, PCR NEGATIVE  NEGATIVE Final   Staphylococcus aureus NEGATIVE  NEGATIVE Final   Comment:            The Xpert SA Assay (FDA     approved for NASAL specimens     in patients over 74 years of age),     is one component of     a comprehensive  surveillance     program.  Test performance has     been validated by [facility] for patients greater     than or equal to 73 year old.     It is not intended     to diagnose infection nor to     guide or monitor treatment.     Studies: Dg Chest 1 View  11/12/2012   *RADIOLOGY REPORT*  Clinical Data: Fall, left hip fracture  CHEST - 1 VIEW  Comparison: 10/20/2007  Findings: Borderline cardiomegaly.  No acute infiltrate or pleural effusion.  No pulmonary edema.  No diagnostic pneumothorax. Mild thoracic dextroscoliosis.  IMPRESSION: Borderline cardiomegaly.  No diagnostic pneumothorax.   Original Report Authenticated By: Natasha Mead, M.D.   Dg Hip Complete Left  11/12/2012   *RADIOLOGY REPORT*  Clinical Data: Fall  LEFT HIP - COMPLETE 2+ VIEW  Comparison: None.  Findings: Three views of the left hip submitted.  There is mild displaced intertrochanteric fracture of proximal left femur.  IMPRESSION: Mild displaced intertrochanteric fracture of proximal left femur.   Original Report Authenticated By: Natasha Mead,  M.D.   Dg Femur Left  11/13/2012   *RADIOLOGY REPORT*  Clinical Data: Fracture fixation.  DG C-ARM 1-60 MIN - NRPT [facility], LEFT FEMUR - 2 VIEW  Technique: Four fluoroscopic intraoperative spot views of the left femur are provided.  Comparison:  Plain films 11/12/2012.  Findings: Images demonstrate placement of a dynamic hip screw and long IM nail with a single distal interlocking screw for fixation of an intertrochanteric fracture.  Hardware appears intact. Position and alignment improved.  There is some degenerative disease about the left hip.  IMPRESSION: ORIF left intertrochanteric fracture.   Original Report Authenticated By: Holley Dexter, M.D.   Dg Pelvis Portable  11/13/2012   *RADIOLOGY REPORT*  Clinical Data: Operative fixation of a left intertrochanteric fracture.  PORTABLE PELVIS  Comparison: Earlier today and yesterday.  Findings: Intramedullary rod and compression screw fixation of the previously demonstrated left intertrochanteric fracture. Essentially anatomic position and alignment.  No additional fractures are seen.  Atheromatous arterial calcifications.  IMPRESSION: Hardware fixation of the previously demonstrated left intertrochanteric fracture.   Original Report Authenticated By: Beckie Salts, M.D.   Dg C-arm 1-60 Min-no Report  11/13/2012   *RADIOLOGY REPORT*  Clinical Data: Fracture fixation.  DG C-ARM 1-60 MIN - NRPT [facility], LEFT FEMUR - 2 VIEW  Technique: Four fluoroscopic intraoperative spot views of the left femur are provided.  Comparison:  Plain films 11/12/2012.  Findings: Images demonstrate placement of a dynamic hip screw and long IM nail with a single distal interlocking screw for fixation of an intertrochanteric fracture.  Hardware appears intact. Position and alignment improved.  There is some degenerative disease about the left hip.  IMPRESSION: ORIF left intertrochanteric fracture.   Original Report Authenticated By: Holley Dexter, M.D.    Scheduled Meds: .  ALPRAZolam  0.5 mg Oral BID  . aspirin EC  325 mg Oral BID  . docusate sodium  100 mg Oral BID  . ferrous sulfate  325 mg Oral Q breakfast  . insulin aspart  0-15 Units Subcutaneous TID WC  . insulin aspart  0-5 Units Subcutaneous QHS  . levothyroxine  50 mcg Oral QAC breakfast  . lisinopril  5 mg Oral Daily  . loratadine  10 mg Oral Daily  . metoprolol tartrate  25 mg Oral BID  . potassium chloride SA  20 mEq Oral Daily  . simvastatin  40 mg Oral QPM  . sodium chloride  3 mL Intravenous Q12H   Continuous Infusions:    Active Problems:   DM   HYPERLIPIDEMIA   Essential hypertension, benign   DYSPNEA   Coronary atherosclerosis of native coronary artery   PAF (paroxysmal atrial fibrillation)   Fx intertrochanteric hip    Time spent: 25    [facility], JESSICA  [facility] Pager [phone]. If 7PM-7AM, please contact night-coverage at [email or web address], password [facility] 11/14/2012, 9:32 AM  LOS: 2 days

## 2012-11-14 NOTE — Evaluation (Signed)
Occupational Therapy Evaluation Patient Details Name: Ellen Woods MRN: [id number] DOB: February 08, 1941 Today's Date: 11/14/2012 Time: 0272-5366 OT Time Calculation (min): 28 min  OT Assessment / Plan / Recommendation History of present illness Pt sustained L femur fx from a fall in [place].  Daughter feels the fall was the result of pt having low blood sugar.  She underwent IM nailing yesterday, 11-13-12.   Clinical Impression   Pt presenting with decreased activity tolerance and needs assistance with functional mobility and ADLs. Will continue to follow acutely in order to address below problem list. Recommending HHOT and 24/7 assistance.    OT Assessment  Patient needs continued OT Services    Follow Up Recommendations  Home health OT;Supervision/Assistance - 24 hour    Barriers to Discharge      Equipment Recommendations  3 in 1 bedside comode;Tub/shower bench    Recommendations for Other Services    Frequency  Min 2X/week    Precautions / Restrictions Precautions Precautions: Fall Restrictions Weight Bearing Restrictions: Yes LLE Weight Bearing: Partial weight bearing LLE Partial Weight Bearing Percentage or Pounds: 30%   Pertinent Vitals/Pain See vitals    ADL  Eating/Feeding: Performed;Independent Where Assessed - Eating/Feeding: Edge of bed Upper Body Bathing: Simulated;Set up Where Assessed - Upper Body Bathing: Unsupported sitting Lower Body Bathing: Simulated;Moderate assistance Where Assessed - Lower Body Bathing: Supported sit to stand Upper Body Dressing: Simulated;Set up Where Assessed - Upper Body Dressing: Unsupported sitting Lower Body Dressing: Simulated;Moderate assistance Where Assessed - Lower Body Dressing: Supported sit to Pharmacist, hospital: Simulated;Moderate assistance Toilet Transfer Method: Sit to stand Toilet Transfer Equipment:  (bed ambulating through room then sitting in chair) Equipment Used: Gait belt;Rolling  walker Transfers/Ambulation Related to ADLs: Mod assist with RW. VCs for technique to maintain PWB status and for sequencing of gait.   ADL Comments: Pt's daughter present durign session and states she feels comfortable assisting pt at home.    OT Diagnosis: Generalized weakness;Acute pain  OT Problem List: Decreased strength;Decreased activity tolerance;Impaired balance (sitting and/or standing);Decreased safety awareness;Decreased knowledge of use of DME or AE;Decreased knowledge of precautions;Pain OT Treatment Interventions: Self-care/ADL training;DME and/or AE instruction;Therapeutic activities;Patient/family education;Balance training   OT Goals(Current goals can be found in the care plan section) Acute Rehab OT Goals Patient Stated Goal: Return home. OT Goal Formulation: With patient Time For Goal Achievement: 11/21/12 Potential to Achieve Goals: Good  Visit Information  Last OT Received On: 11/14/12 Assistance Needed: +1 PT/OT Co-Evaluation/Treatment: Yes History of Present Illness: Pt sustained L femur fx from a fall in [place].  Daughter feels the fall was the result of pt having low blood sugar.  She underwent IM nailing yesterday, 11-13-12.       Prior Functioning     Home Living Family/patient expects to be discharged to:: Private residence Living Arrangements: Children Available Help at Discharge: Family;Available 24 hours/day Type of Home: Mobile home Home Access: Stairs to enter Entrance Stairs-Number of Steps: 5 Entrance Stairs-Rails: Right;Left Home Layout: One level Home Equipment: None Prior Function Level of Independence: Independent Communication Communication: No difficulties Dominant Hand: Right         Vision/Perception     Cognition  Cognition Arousal/Alertness: Awake/alert Behavior During Therapy: WFL for tasks assessed/performed Overall Cognitive Status: Within Functional Limits for tasks assessed    Extremity/Trunk Assessment Upper  Extremity Assessment Upper Extremity Assessment: Overall WFL for tasks assessed     Mobility Bed Mobility Bed Mobility: Supine to Sit Supine to Sit: 3: Mod assist  Sitting - Scoot to Edge of Bed: 3: Mod assist Details for Bed Mobility Assistance: verbal/tactile cues for sequencing Transfers Transfers: Stand to Sit;Sit to Stand Sit to Stand: 3: Mod assist;From bed;With upper extremity assist Stand to Sit: 4: Min assist;To chair/3-in-1;With armrests Details for Transfer Assistance: verbal cues for sequencing, hand placement     Exercise     Balance     End of Session OT - End of Session Equipment Utilized During Treatment: Gait belt;Rolling walker Activity Tolerance: Patient tolerated treatment well Patient left: in chair;with call bell/phone within reach;with family/visitor present Nurse Communication: Mobility status  GO   11/14/2012 Cipriano Mile OTR/L Pager [phone] Office [phone]   Cipriano Mile 11/14/2012, 11:09 AM

## 2012-11-14 NOTE — Progress Notes (Signed)
Orthopedic Tech Progress Note Patient Details:  Ellen Woods 26-Nov-1940 [id number]  Patient ID: [name], female   DOB: 08/10/1940, 72 y.o.   MRN: [id number]   Shawnie Pons 11/14/2012, 3:51 PMTrapeze bar

## 2012-11-15 DIAGNOSIS — D62 Acute posthemorrhagic anemia: Secondary | ICD-10-CM

## 2012-11-15 DIAGNOSIS — K5900 Constipation, unspecified: Secondary | ICD-10-CM

## 2012-11-15 LAB — GLUCOSE, CAPILLARY
Glucose-Capillary: 269 mg/dL — ABNORMAL HIGH (ref 70–99)
Glucose-Capillary: 155 mg/dL — ABNORMAL HIGH (ref 70–99)
Glucose-Capillary: 181 mg/dL — ABNORMAL HIGH (ref 70–99)

## 2012-11-15 LAB — BASIC METABOLIC PANEL
CO2: 21 mEq/L (ref 19–32)
Calcium: 8.5 mg/dL (ref 8.4–10.5)
Creatinine, Ser: 1.04 mg/dL (ref 0.50–1.10)
Glucose, Bld: 287 mg/dL — ABNORMAL HIGH (ref 70–99)
Sodium: 135 mEq/L (ref 135–145)

## 2012-11-15 LAB — CBC
Hemoglobin: 8.1 g/dL — ABNORMAL LOW (ref 12.0–15.0)
MCH: 32.7 pg (ref 26.0–34.0)
MCV: 97.2 fL (ref 78.0–100.0)
RBC: 2.48 MIL/uL — ABNORMAL LOW (ref 3.87–5.11)

## 2012-11-15 MED ORDER — FERROUS SULFATE 325 (65 FE) MG PO TABS
325.0000 mg | ORAL_TABLET | Freq: Two times a day (BID) | ORAL | Status: DC
  Administered 2012-11-15 – 2012-11-18 (×6): 325 mg via ORAL
  Filled 2012-11-15 (×8): qty 1

## 2012-11-15 MED ORDER — FUROSEMIDE 20 MG PO TABS
20.0000 mg | ORAL_TABLET | Freq: Every day | ORAL | Status: DC
  Administered 2012-11-15: 20 mg via ORAL
  Filled 2012-11-15 (×2): qty 1

## 2012-11-15 MED ORDER — ALUM & MAG HYDROXIDE-SIMETH 200-200-20 MG/5ML PO SUSP
30.0000 mL | ORAL | Status: DC | PRN
  Administered 2012-11-15 – 2012-11-16 (×2): 30 mL via ORAL
  Filled 2012-11-15 (×2): qty 30

## 2012-11-15 MED ORDER — METOPROLOL TARTRATE 12.5 MG HALF TABLET
12.5000 mg | ORAL_TABLET | Freq: Two times a day (BID) | ORAL | Status: DC
  Administered 2012-11-17 – 2012-11-18 (×2): 12.5 mg via ORAL
  Filled 2012-11-15 (×7): qty 1

## 2012-11-15 MED ORDER — INSULIN GLARGINE 100 UNIT/ML ~~LOC~~ SOLN
15.0000 [IU] | Freq: Every morning | SUBCUTANEOUS | Status: DC
  Administered 2012-11-16 – 2012-11-18 (×3): 15 [IU] via SUBCUTANEOUS
  Filled 2012-11-15 (×3): qty 0.15

## 2012-11-15 MED ORDER — INSULIN GLARGINE 100 UNIT/ML ~~LOC~~ SOLN: 26.0000 [IU] | Freq: Every morning | SUBCUTANEOUS | Status: DC

## 2012-11-15 MED ORDER — POLYETHYLENE GLYCOL 3350 17 G PO PACK
17.0000 g | PACK | Freq: Every day | ORAL | Status: DC
  Administered 2012-11-15 – 2012-11-18 (×4): 17 g via ORAL
  Filled 2012-11-15 (×6): qty 1

## 2012-11-15 NOTE — Care Management Note (Unsigned)
    Page 1 of 2   11/15/2012     11:54:40 AM   CARE MANAGEMENT NOTE 11/15/2012  Patient:  Ellen Woods, Ellen Woods   Account Number:  [id number]  Date Initiated:  11/15/2012  Documentation initiated by:  [facility]  Subjective/Objective Assessment:   admitted with left intertrochanteric fracture     Action/Plan:   PT/OT evals-recommended HHPT, HHOT with supervision   Anticipated DC Date:  11/16/2012   Anticipated DC Plan:  HOME W HOME HEALTH SERVICES      DC Planning Services  CM consult      Choice offered to / List presented to:  C-1 Patient   DME arranged  3-N-1  WALKER - ROLLING      DME agency  [employer]     HH arranged  HH-2 PT  HH-3 OT      HH agency  [facility]   Status of service:  In process, will continue to follow Medicare Important Message given?   (If response is "NO", the following Medicare IM given date fields will be blank) Date Medicare IM given:   Date Additional Medicare IM given:    Discharge Disposition:    Per UR Regulation:    If discussed at Long Length of Stay Meetings, dates discussed:    Comments:  11/15/12 Spoke with patient about HHC. She chose [facility] from the [facility] list of agencies. Contacted Layla Gramm at [facility] and set up HHPT and HHOT. Patient stated that she lives alone but will have someone with her when she goes home. Patient agreeable to getting a rolling walker and 3N1. Contacted Nathan at [employer] and requested equipment. Jacquelynn Cree RN, BSN , CCM

## 2012-11-15 NOTE — Progress Notes (Signed)
[facility] PROGRESS NOTE  Ellen Woods GEX:[id number] DOB: Mar 05, 1941 DOA: 11/12/2012 PCP: Rudi Heap, MD  Assessment/Plan: 1. Left hip fracture- s/p ORIF; ortho consult, PT/OT, incentive spirometry 2. DM- SSI 3. HLD- home meds 4. HTN- home meds with holding parameters 5. CAD- stable, echo pending 6. ABLA- follow and transfuse as needed for < 8- added Fe daily  D/c foley  Code Status: full Family Communication: patient and family at bedside Disposition Plan: d/c in AM with H/H    Consultants:  ortho  Procedures:  ORIF 7/12  Antibiotics:    HPI/Subjective: Doing well, no BM  Objective: Filed Vitals:   11/14/12 1440 11/14/12 2000 11/14/12 2351 11/15/12 0725  BP: 107/33  101/59 112/41  Pulse: 82  80 75  Temp: 99.1 F (37.3 C)  99 F (37.2 C) 98.7 F (37.1 C)  TempSrc: Oral  Oral   Resp:  18 16 16   Height:      Weight:      SpO2: 100%  100% 100%    Intake/Output Summary (Last 24 hours) at 11/15/12 1149 Last data filed at 11/15/12 0900  Gross per 24 hour  Intake    720 ml  Output   1075 ml  Net   -355 ml   Filed Weights   11/12/12 1257  Weight: 55.792 kg (123 lb)    Exam:   General:  A+Ox3, NAD  Cardiovascular: rrr  Respiratory: clear anterior  Abdomen: +Bs,soft, NT  Musculoskeletal: pain in hip   Data Reviewed: Basic Metabolic Panel:  Recent Labs Lab 11/12/12 1420 11/13/12 0017 11/14/12 0540 11/15/12 0500  NA 139  --  139 135  K 4.4  --  3.8 5.1  CL 102  --  102 99  CO2 26  --  26 21  GLUCOSE 84  --  103* 287*  BUN 22  --  17 27*  CREATININE 0.89 0.86 0.86 1.04  CALCIUM 9.3  --  8.2* 8.5   Liver Function Tests:  Recent Labs Lab 11/12/12 1420  AST 31  ALT 17  ALKPHOS 65  BILITOT 0.7  PROT 7.0  ALBUMIN 4.0   No results found for this basename: LIPASE, AMYLASE,  in the last 168 hours No results found for this basename: AMMONIA,  in the last 168 hours CBC:  Recent Labs Lab 11/12/12 1420 11/13/12 0017  11/14/12 0540 11/15/12 0500  WBC 10.4 6.6 5.2 7.6  NEUTROABS 8.4*  --   --   --   HGB 12.5 10.3* 9.0* 8.1*  HCT 35.4* 30.5* 27.5* 24.1*  MCV 94.1 95.6 97.2 97.2  PLT 218 180 162 153   Cardiac Enzymes: No results found for this basename: CKTOTAL, CKMB, CKMBINDEX, TROPONINI,  in the last 168 hours BNP (last 3 results) No results found for this basename: PROBNP,  in the last 8760 hours CBG:  Recent Labs Lab 11/14/12 1218 11/14/12 1616 11/14/12 2350 11/15/12 0648 11/15/12 1113  GLUCAP 304* 215* 125* 271* 269*    Recent Results (from the past 240 hour(s))  SURGICAL PCR SCREEN     Status: None   Collection Time    11/13/12  4:14 AM      Result Value Range Status   MRSA, PCR NEGATIVE  NEGATIVE Final   Staphylococcus aureus NEGATIVE  NEGATIVE Final   Comment:            The Xpert SA Assay (FDA     approved for NASAL specimens     in patients over  61 years of age),     is one component of     a comprehensive surveillance     program.  Test performance has     been validated by [employer] for patients greater     than or equal to 63 year old.     It is not intended     to diagnose infection nor to     guide or monitor treatment.     Studies: Dg Femur Left  11/13/2012   *RADIOLOGY REPORT*  Clinical Data: Fracture fixation.  DG C-ARM 1-60 MIN - NRPT [facility], LEFT FEMUR - 2 VIEW  Technique: Four fluoroscopic intraoperative spot views of the left femur are provided.  Comparison:  Plain films 11/12/2012.  Findings: Images demonstrate placement of a dynamic hip screw and long IM nail with a single distal interlocking screw for fixation of an intertrochanteric fracture.  Hardware appears intact. Position and alignment improved.  There is some degenerative disease about the left hip.  IMPRESSION: ORIF left intertrochanteric fracture.   Original Report Authenticated By: Holley Dexter, M.D.   Dg Pelvis Portable  11/13/2012   *RADIOLOGY REPORT*  Clinical Data: Operative fixation  of a left intertrochanteric fracture.  PORTABLE PELVIS  Comparison: Earlier today and yesterday.  Findings: Intramedullary rod and compression screw fixation of the previously demonstrated left intertrochanteric fracture. Essentially anatomic position and alignment.  No additional fractures are seen.  Atheromatous arterial calcifications.  IMPRESSION: Hardware fixation of the previously demonstrated left intertrochanteric fracture.   Original Report Authenticated By: Beckie Salts, M.D.   Dg C-arm 1-60 Min-no Report  11/13/2012   *RADIOLOGY REPORT*  Clinical Data: Fracture fixation.  DG C-ARM 1-60 MIN - NRPT [facility], LEFT FEMUR - 2 VIEW  Technique: Four fluoroscopic intraoperative spot views of the left femur are provided.  Comparison:  Plain films 11/12/2012.  Findings: Images demonstrate placement of a dynamic hip screw and long IM nail with a single distal interlocking screw for fixation of an intertrochanteric fracture.  Hardware appears intact. Position and alignment improved.  There is some degenerative disease about the left hip.  IMPRESSION: ORIF left intertrochanteric fracture.   Original Report Authenticated By: Holley Dexter, M.D.    Scheduled Meds: . ALPRAZolam  0.5 mg Oral BID  . aspirin EC  325 mg Oral BID  . docusate sodium  100 mg Oral BID  . ferrous sulfate  325 mg Oral BID WC  . insulin aspart  0-15 Units Subcutaneous TID WC  . insulin aspart  0-5 Units Subcutaneous QHS  . levothyroxine  50 mcg Oral QAC breakfast  . lisinopril  5 mg Oral Daily  . loratadine  10 mg Oral Daily  . metoprolol tartrate  25 mg Oral BID  . polyethylene glycol  17 g Oral Daily  . potassium chloride SA  20 mEq Oral Daily  . simvastatin  40 mg Oral QPM  . sodium chloride  3 mL Intravenous Q12H   Continuous Infusions:    Active Problems:   DM   HYPERLIPIDEMIA   Essential hypertension, benign   DYSPNEA   Coronary atherosclerosis of native coronary artery   PAF (paroxysmal atrial fibrillation)   Fx  intertrochanteric hip    Time spent: 25    [facility], Zackari Ruane  [facility] Pager [phone]. If 7PM-7AM, please contact night-coverage at [email or web address], password [facility] 11/15/2012, 11:49 AM  LOS: 3 days

## 2012-11-15 NOTE — Progress Notes (Signed)
Clinical Social Work Department CLINICAL SOCIAL WORK PLACEMENT NOTE 11/15/2012  Patient:  Ellen Woods, Ellen Woods  Account Number:  [id number] Admit date:  11/12/2012  Clinical Social Worker:  Theresia Bough, Theresia Majors  Date/time:  11/15/2012 04:53 PM  Clinical Social Work is seeking post-discharge placement for this patient at the following level of care:   SKILLED NURSING   (*CSW will update this form in Epic as items are completed)   11/15/2012  Patient/family provided with [facility] Department of Clinical Social Work's list of facilities offering this level of care within the geographic area requested by the patient (or if unable, by the patient's family).  11/15/2012  Patient/family informed of their freedom to choose among providers that offer the needed level of care, that participate in Medicare, Medicaid or managed care program needed by the patient, have an available bed and are willing to accept the patient.  11/15/2012  Patient/family informed of [facility]' ownership interest in [facility], as well as of the fact that they are under no obligation to receive care at this facility.  PASARR submitted to EDS on 11/15/2012 PASARR number received from EDS on 11/15/2012  FL2 transmitted to all facilities in geographic area requested by pt/family on  11/15/2012 FL2 transmitted to all facilities within larger geographic area on   Patient informed that his/her managed care company has contracts with or will negotiate with  certain facilities, including the following:     Patient/family informed of bed offers received:   Patient chooses bed at  Physician recommends and patient chooses bed at    Patient to be transferred to  on   Patient to be transferred to facility by   The following physician request were entered in Epic:   Additional Comments: Theresia Bough, MSW, [employer] [phone]

## 2012-11-15 NOTE — Progress Notes (Signed)
Subjective: 2 Days Post-Op Procedure(s) (LRB): INTRAMEDULLARY (IM) NAIL INTERTROCHANTRIC LEFT HIP (Left) Blood loss anemia but seems asymptomatic Activity level:  Has been out of bed partial weightbearing on left leg. Diet tolerance:  Eating Voiding:  Okay Patient reports pain as 3 on 0-10 scale.    Objective: Vital signs in last 24 hours: Temp:  [98.7 F (37.1 C)-99.1 F (37.3 C)] 98.7 F (37.1 C) (07/14 0725) Pulse Rate:  [75-82] 75 (07/14 0725) Resp:  [16-18] 16 (07/14 0725) BP: (101-112)/(33-59) 112/41 mmHg (07/14 0725) SpO2:  [100 %] 100 % (07/14 0725)  Labs:  Recent Labs  11/12/12 1420 11/13/12 0017 11/14/12 0540 11/15/12 0500  HGB 12.5 10.3* 9.0* 8.1*    Recent Labs  11/14/12 0540 11/15/12 0500  WBC 5.2 7.6  RBC 2.83* 2.48*  HCT 27.5* 24.1*  PLT 162 153    Recent Labs  11/14/12 0540 11/15/12 0500  NA 139 135  K 3.8 5.1  CL 102 99  CO2 26 21  BUN 17 27*  CREATININE 0.86 1.04  GLUCOSE 103* 287*  CALCIUM 8.2* 8.5    Recent Labs  11/12/12 1420  INR 1.01    Physical Exam:  Neurologically intact ABD soft Neurovascular intact Sensation intact distally Intact pulses distally Dorsiflexion/Plantar flexion intact No cellulitis present Compartment soft  Assessment/Plan:  2 Days Post-Op Procedure(s) (LRB): INTRAMEDULLARY (IM) NAIL INTERTROCHANTRIC LEFT HIP (Left) Advance diet Up with therapy Continue ASA 325. 1 by mouth twice a day for 4 weeks. She will continue to be partial weightbearing on surgical leg. She would like to consider nursing facility placement if possible. Probably good for discharge soon per orthopedics. Will let medicine team transfuse if necessary. Patient seems to be doing well at this time. Prescriptions for pain medicine and ASA on the chart. Followup to see Dr. Jerl Santos in 2 weeks.    Ellen Woods R 11/15/2012, 9:20 AM

## 2012-11-15 NOTE — Progress Notes (Signed)
UR COMPLETED  

## 2012-11-15 NOTE — Progress Notes (Signed)
Clinical Social Work Department BRIEF PSYCHOSOCIAL ASSESSMENT 11/15/2012  Patient:  Ellen Woods, Ellen Woods     Account Number:  [id number]     Admit date:  11/12/2012  Clinical Social Worker:  Lourdes Sledge  Date/Time:  11/15/2012 04:47 PM  Referred by:  Physician  Date Referred:  11/15/2012 Referred for  SNF Placement   Other Referral:   Interview type:  Patient Other interview type:    PSYCHOSOCIAL DATA Living Status:  WITH ADULT CHILDREN Admitted from facility:   Level of care:   Primary support name:  Abra Lingenfelter [phone] Primary support relationship to patient:  CHILD, ADULT Degree of support available:   Pt states her daughter is actively involved in care however has her own medical problems and may not be able to care for pt at discharge.    CURRENT CONCERNS Current Concerns  Post-Acute Placement   Other Concerns:    SOCIAL WORK ASSESSMENT / PLAN Assisting CSW informed that pt will need SNF placement at discharge.    CSW spoke with pt and introduced herself and role. CSW explored pt living arrangements and amount of support pt has at home. Pt stated she lives with her daughter however pt states her daughter has several medical problems which make it difficult for her to care for pt.CSW informed pt of PT recommendations for SNF placement and explored whether pt would be agreeable. Pt stated she would be agreeable to SNF placement in [facility]. CSW received consent to do a SNF search. CSW to submit pt information to pt insurance for authorization. Pt made aware of insurance process.   Assessment/plan status:  Psychosocial Support/Ongoing Assessment of Needs Other assessment/ plan:   Information/referral to community resources:   SNF list provided to pt.    PATIENT'S/FAMILY'S RESPONSE TO PLAN OF CARE: Pt presents alert and oriented and is agreeable to SNF placement. No concerns at this time.       Theresia Bough, MSW, Theresia Majors [phone]

## 2012-11-15 NOTE — Progress Notes (Signed)
Inpatient Diabetes Program Recommendations  AACE/ADA: New Consensus Statement on Inpatient Glycemic Control (2013)  Target Ranges:  Prepandial:   less than 140 mg/dL      Peak postprandial:   less than 180 mg/dL (1-2 hours)      Critically ill patients:  140 - 180 mg/dL   Hyperglycemia in upper 200's using correction scale only.  Inpatient Diabetes Program Recommendations Insulin - Basal: Please add basal lantus to insulin regimen. Start at 20 units (she takes 26 units at home) Diet: Added carbohydrate modified to diet orders with co-sign required  Thank you, Lenor Coffin, RN, CNS, Diabetes Coordinator [phone])

## 2012-11-15 NOTE — Progress Notes (Addendum)
Seen and agreed with above note. Patient not safe to DC home. Will benefit from STSNF for continued therapies.  Spoke with the OT as well and she is in agreement. Case Manager and SW made aware. 11/15/2012 Fredrich Birks PTA [phone] pager [phone] office

## 2012-11-15 NOTE — Progress Notes (Signed)
Physical Therapy Treatment Patient Details Name: Ellen Woods MRN: [id number] DOB: [birth year] Today's Date: 11/15/2012 Time: 6213-0865 PT Time Calculation (min): 28 min  PT Assessment / Plan / Recommendation  PT Comments   At this time the pt is not progressing toward PT goals. Pt is requiring same level of assistance for functional activities and only able to ambulate the same distance as previous Tx. Pt has difficulty following commands and is unsteady with RW. She requires physical assistance for functional activities and gait. Pt stated that she lives with her daughter who is disabled and not able to provide much assistance. Pt will greatly benefit from ST-SNF in order to increase her strength, mobility, and functional independence in a safe enviroment.   Follow Up Recommendations  SNF     Does the patient have the potential to tolerate intense rehabilitation     Barriers to Discharge        Equipment Recommendations  Rolling walker with 5" wheels    Recommendations for Other Services    Frequency Min 6X/week   Progress towards PT Goals Progress towards PT goals: Not progressing toward goals  Plan Discharge plan needs to be updated    Precautions / Restrictions Precautions Precautions: Fall Restrictions Weight Bearing Restrictions: Yes LLE Weight Bearing: Partial weight bearing LLE Partial Weight Bearing Percentage or Pounds: 30%   Pertinent Vitals/Pain Unable to articulate pain scale. Said, "it's no picnic."    Mobility  Bed Mobility Bed Mobility: Not assessed Transfers Transfers: Sit to Stand;Stand to Sit Sit to Stand: 3: Mod assist;With upper extremity assist;With armrests;From chair/3-in-1 Stand to Sit: 4: Min assist;With upper extremity assist;With armrests;To chair/3-in-1 Details for Transfer Assistance: VC for sequencing, hand placement, WB status, and to safely lower herself during stand-->sit. Ambulation/Gait Ambulation/Gait Assistance: 3: Mod  assist Ambulation Distance (Feet): 15 Feet Assistive device: Rolling walker Ambulation/Gait Assistance Details: Repeated cueing for sequencing with the RW and maintaining PWB status. Gait Pattern: Step-to pattern;Antalgic;Trunk flexed Gait velocity: decreased Stairs: No Wheelchair Mobility Wheelchair Mobility: No    Exercises        PT Goals (current goals can now be found in the care plan section) Acute Rehab PT Goals Time For Goal Achievement: 11/21/12 Potential to Achieve Goals: Good  Visit Information  Last PT Received On: 11/15/12 Assistance Needed: +1 History of Present Illness: Pt sustained L femur fx from a fall in [place].  Daughter feels the fall was the result of pt having low blood sugar.  She underwent IM nailing yesterday, 11-13-12.    Subjective Data      Cognition  Cognition Arousal/Alertness: Awake/alert Behavior During Therapy: WFL for tasks assessed/performed Overall Cognitive Status: Difficult to assess Memory: Decreased recall of precautions Difficult to assess due to: Impaired communication (Unable to fully articulate concepts and ideas normally with little carry-over of commands.)    Balance     End of Session PT - End of Session Equipment Utilized During Treatment: Gait belt Activity Tolerance: Patient tolerated treatment well Patient left: in chair;with call bell/phone within reach Nurse Communication: Mobility status   GP     Ellen Woods, SPTA 11/15/2012, 2:41 PM

## 2012-11-15 NOTE — Progress Notes (Signed)
Occupational Therapy Treatment Patient Details Name: Ellen Woods MRN: [id number] DOB: 01/26/1941 Today's Date: 11/15/2012 Time: 7829-5621 OT Time Calculation (min): 25 min  OT Assessment / Plan / Recommendation  OT comments  Performed toileting tasks and grooming at sink.  Pt states she lives with her daughter who is not able to provide much physical assistance. Pt will benefit from ST-SNF to increase independence and safety prior to d/c home.   Follow Up Recommendations  SNF;Supervision/Assistance - 24 hour    Barriers to Discharge       Equipment Recommendations  3 in 1 bedside comode;Tub/shower bench    Recommendations for Other Services    Frequency Min 2X/week   Progress towards OT Goals Progress towards OT goals: Progressing toward goals  Plan Discharge plan needs to be updated    Precautions / Restrictions Precautions Precautions: Fall Restrictions Weight Bearing Restrictions: Yes LLE Weight Bearing: Partial weight bearing LLE Partial Weight Bearing Percentage or Pounds: 30%   Pertinent Vitals/Pain Unable to articulate pain scale. Said, "it's no picnic."     ADL  Grooming: Performed;Wash/dry face;Min guard Where Assessed - Grooming: Supported Copywriter, advertising: Performed;Moderate assistance;Minimal assistance Toilet Transfer Method: Sit to stand Toilet Transfer Equipment: Raised toilet seat with arms (or 3-in-1 over toilet) Toileting - Clothing Manipulation and Hygiene: Performed;+1 Total assistance;Supervision/safety;Set up (Total A-clothing while standing; setup/supervision-hygiene) Where Assessed - Toileting Clothing Manipulation and Hygiene: Sit to stand from 3-in-1 or toilet;Sit on 3-in-1 or toilet (hygiene-sitting and clothing-sit to stand) Equipment Used: Gait belt;Rolling walker Transfers/Ambulation Related to ADLs: Mod A for ambulation. Min/Mod A for transfers. ADL Comments: Pt sitting on 3 in 1 when OT arrived. Pt able to perform hygiene while  sitting on 3 in 1 with setup/supervision. Pt unable to perform clothing manipulation once standing requiring total A. Pt ambulated to sink to wash hands at minguard level.     OT Diagnosis:    OT Problem List:   OT Treatment Interventions:     OT Goals(current goals can now be found in the care plan section) Acute Rehab OT Goals Patient Stated Goal: Return home. OT Goal Formulation: With patient Time For Goal Achievement: 11/21/12 Potential to Achieve Goals: Good ADL Goals Pt Will Perform Lower Body Bathing: with supervision;sit to/from stand;with adaptive equipment Pt Will Perform Lower Body Dressing: with supervision;with adaptive equipment;sit to/from stand Pt Will Transfer to Toilet: with supervision;ambulating;bedside commode;regular height toilet Pt Will Perform Toileting - Clothing Manipulation and hygiene: sit to/from stand;with supervision Pt Will Perform Tub/Shower Transfer: Tub transfer;with min guard assist;tub bench;ambulating Additional ADL Goal #1: Pt will perform bed mobility with supervision as precursor for EOB ADLs.  Visit Information  Last OT Received On: 11/15/12 Assistance Needed: +1 PT/OT Co-Evaluation/Treatment: Yes History of Present Illness: Pt sustained L femur fx from a fall in [place].  Daughter feels the fall was the result of pt having low blood sugar.  She underwent IM nailing yesterday, 11-13-12.    Subjective Data      Prior Functioning       Cognition  Cognition Arousal/Alertness: Awake/alert Behavior During Therapy: WFL for tasks assessed/performed Overall Cognitive Status: Difficult to assess Area of Impairment: Following commands Memory: Decreased recall of precautions Following Commands: Follows one step commands inconsistently;Follows one step commands with increased time Difficult to assess due to: Impaired communication (unable to articulate concepts and ideas normally)    Mobility  Bed Mobility Bed Mobility: Not  assessed Transfers Transfers: Sit to Stand;Stand to Sit Sit to Stand: 3: Mod  assist;With upper extremity assist;With armrests;From chair/3-in-1;4: Min assist Stand to Sit: 4: Min assist;With upper extremity assist;With armrests;To chair/3-in-1 Details for Transfer Assistance: Intitially requiring Mod A to stand but progressed to Min A. VC's for sequencing, hand placement, WB status, and to control descent when sitting.    Exercises      Balance     End of Session OT - End of Session Equipment Utilized During Treatment: Gait belt;Rolling walker Activity Tolerance: Patient tolerated treatment well Patient left: in chair;with call bell/phone within reach  GO     Earlie Raveling OTR/L [phone] 11/15/2012, 3:56 PM

## 2012-11-16 ENCOUNTER — Encounter (HOSPITAL_COMMUNITY): Payer: Self-pay | Admitting: Orthopaedic Surgery

## 2012-11-16 ENCOUNTER — Inpatient Hospital Stay (HOSPITAL_COMMUNITY): Payer: Medicare HMO

## 2012-11-16 LAB — CBC
Platelets: 169 10*3/uL (ref 150–400)
RBC: 2.43 MIL/uL — ABNORMAL LOW (ref 3.87–5.11)
RDW: 12.7 % (ref 11.5–15.5)
WBC: 5.8 10*3/uL (ref 4.0–10.5)

## 2012-11-16 LAB — BASIC METABOLIC PANEL
CO2: 21 mEq/L (ref 19–32)
Calcium: 8.4 mg/dL (ref 8.4–10.5)
Chloride: 98 mEq/L (ref 96–112)
GFR calc Af Amer: 45 mL/min — ABNORMAL LOW (ref >90)
Sodium: 135 mEq/L (ref 135–145)

## 2012-11-16 LAB — GLUCOSE, CAPILLARY: Glucose-Capillary: 248 mg/dL — ABNORMAL HIGH (ref 70–99)

## 2012-11-16 LAB — HEMOGLOBIN AND HEMATOCRIT, BLOOD: Hemoglobin: 11.8 g/dL — ABNORMAL LOW (ref 12.0–15.0)

## 2012-11-16 LAB — PREPARE RBC (CROSSMATCH)

## 2012-11-16 MED ORDER — MINERAL OIL RE ENEM
1.0000 | ENEMA | Freq: Once | RECTAL | Status: AC
  Administered 2012-11-17: 1 via RECTAL
  Filled 2012-11-16: qty 1

## 2012-11-16 MED ORDER — SODIUM CHLORIDE 0.9 % IV SOLN
INTRAVENOUS | Status: DC
  Administered 2012-11-16: 09:00:00 via INTRAVENOUS

## 2012-11-16 NOTE — Progress Notes (Signed)
Subjective: 3 Days Post-Op Procedure(s) (LRB): INTRAMEDULLARY (IM) NAIL INTERTROCHANTRIC LEFT HIP (Left) Some nausea and vomiting. Receiving 2 units of packed red blood cells today. Activity level:  May be partial weightbearing left leg when getting up with therapy. Diet tolerance:  She is nausea is and only eating light food. Voiding:  Voiding on her own. Patient reports pain as 2 on 0-10 scale.    Objective: Vital signs in last 24 hours: Temp:  [98.1 F (36.7 C)-98.8 F (37.1 C)] 98.1 F (36.7 C) (07/15 1204) Pulse Rate:  [68-97] 88 (07/15 1204) Resp:  [16-20] 18 (07/15 1204) BP: (90-142)/(30-60) 128/49 mmHg (07/15 1204) SpO2:  [95 %-100 %] 100 % (07/15 0728)  Labs:  Recent Labs  11/14/12 0540 11/15/12 0500 11/16/12 0510  HGB 9.0* 8.1* 7.8*    Recent Labs  11/15/12 0500 11/16/12 0510  WBC 7.6 5.8  RBC 2.48* 2.43*  HCT 24.1* 23.7*  PLT 153 169    Recent Labs  11/15/12 0500 11/16/12 0510  NA 135 135  K 5.1 5.8*  CL 99 98  CO2 21 21  BUN 27* 43*  CREATININE 1.04 1.33*  GLUCOSE 287* 362*  CALCIUM 8.5 8.4   No results found for this basename: LABPT, INR,  in the last 72 hours  Physical Exam:  Neurologically intact Neurovascular intact Sensation intact distally Intact pulses distally Dorsiflexion/Plantar flexion intact Incision: scant drainage No cellulitis present Compartment soft will change dressing as needed.  Assessment/Plan:  3 Days Post-Op Procedure(s) (LRB): INTRAMEDULLARY (IM) NAIL INTERTROCHANTRIC LEFT HIP (Left) Up with therapy after receiving blood and nausea vomiting under better control. Patient can be 30% partial weightbearing on left leg. will change dressing as needed.     Aasir Daigler R 11/16/2012, 12:27 PM

## 2012-11-16 NOTE — Progress Notes (Signed)
1300 Troponin level not drawn d/t RBC administering, MD aware.

## 2012-11-16 NOTE — Progress Notes (Signed)
Patient refused enema this shift d/t "don't feel good".  MD made aware, reported to night shift.

## 2012-11-16 NOTE — Progress Notes (Signed)
Physical Therapy Treatment Patient Details Name: Ellen Woods MRN: [id number] DOB: May 28, 1940 Today's Date: 11/16/2012 Time: 4098-1191 PT Time Calculation (min): 31 min  PT Assessment / Plan / Recommendation  PT Comments   Pt is making very slow progress today toward PT goals. Tx was limited to labs being taken and x-ray coming to get pt. Pt appeared very weak and c/o nausea. PT will continue to follow.  Follow Up Recommendations  SNF     Does the patient have the potential to tolerate intense rehabilitation     Barriers to Discharge        Equipment Recommendations  Rolling walker with 5" wheels    Recommendations for Other Services    Frequency Min 6X/week   Progress towards PT Goals Progress towards PT goals: Progressing toward goals  Plan Current plan remains appropriate    Precautions / Restrictions Precautions Precautions: Fall Restrictions Weight Bearing Restrictions: Yes LLE Weight Bearing: Partial weight bearing LLE Partial Weight Bearing Percentage or Pounds: 30%   Pertinent Vitals/Pain C/o nausea and fatigue. Unable to use pain scale.    Mobility  Bed Mobility Bed Mobility: Supine to Sit;Sitting - Scoot to Edge of Bed;Sit to Supine Supine to Sit: 3: Mod assist Sitting - Scoot to Edge of Bed: 3: Mod assist Sit to Supine: 3: Mod assist Details for Bed Mobility Assistance: Max directional cues for sequencing and technique to bring LEs off bed and for scooting. Assist to support LLE and to elevate trunk OOB. Transfers Transfers: Sit to Stand;Stand to Sit;Stand Pivot Transfers Sit to Stand: With upper extremity assist;With armrests;From chair/3-in-1;4: Min assist;From bed Stand to Sit: 4: Min assist;With upper extremity assist;With armrests;To chair/3-in-1 Stand Pivot Transfers: 4: Min assist Details for Transfer Assistance: pt requires min assist to achieve a standing position and needs frequent VC for hand placement and  sequencing Ambulation/Gait Ambulation/Gait Assistance: Not tested (comment)    Exercises        PT Goals (current goals can now be found in the care plan section) Acute Rehab PT Goals Time For Goal Achievement: 11/21/12 Potential to Achieve Goals: Good  Visit Information  Last PT Received On: 11/16/12 Assistance Needed: +1 History of Present Illness: Pt sustained L femur fx from a fall in [place].  Daughter feels the fall was the result of pt having low blood sugar.  She underwent IM nailing yesterday, 11-13-12.    Subjective Data      Cognition  Cognition Arousal/Alertness: Awake/alert Behavior During Therapy: WFL for tasks assessed/performed Overall Cognitive Status: Difficult to assess Area of Impairment: Following commands Memory: Decreased recall of precautions Following Commands: Follows one step commands inconsistently;Follows one step commands with increased time Difficult to assess due to: Impaired communication    Balance     End of Session PT - End of Session Equipment Utilized During Treatment: Gait belt Activity Tolerance: Patient limited by lethargy;Patient limited by pain Patient left: in bed;with call bell/phone within reach;with nursing/sitter in room Nurse Communication: Mobility status   GP     Jolyn Nap, SPTA 11/16/2012, 10:01 AM

## 2012-11-16 NOTE — Progress Notes (Signed)
[facility] PROGRESS NOTE  Ellen Woods ZOX:[id number] DOB: January 04, 1941 DOA: 11/12/2012 PCP: Rudi Heap, MD  Assessment/Plan: 1. Left hip fracture- s/p ORIF;  PT/OT-home health, incentive spirometry 2. N/V- check abd x ray. Cycle CE 3. Chest discomfort- ? Related to N/V- cycle CE, watch on tele; transfuse 2 units as has h/o CAD, EKG done 4. DM- SSI- home lantus 5. HLD- home meds 6. HTN- home meds with holding parameters 7. CAD- cycle CE 8. ABLA- follow and transfuse as needed for < 8- added Fe daily- tranfuse 2 units  D/c foley  Code Status: full Family Communication: patient and family at bedside Disposition Plan: d/c with H/H when medically ready   Consultants:  ortho  Procedures:  ORIF 7/12  Antibiotics:    HPI/Subjective: Chest pain this AM No BM still Some nausea this AM as well  Objective: Filed Vitals:   11/15/12 1957 11/15/12 2257 11/16/12 0614 11/16/12 0728  BP: 120/39 98/60 142/47 90/48  Pulse: 79  88 97  Temp: 98.5 F (36.9 C)  98.1 F (36.7 C) 98.8 F (37.1 C)  TempSrc: Oral  Oral Oral  Resp: 18  18 20   Height:      Weight:      SpO2: 99%  96% 100%    Intake/Output Summary (Last 24 hours) at 11/16/12 0832 Last data filed at 11/15/12 1438  Gross per 24 hour  Intake    240 ml  Output    300 ml  Net    -60 ml   Filed Weights   11/12/12 1257  Weight: 55.792 kg (123 lb)    Exam:   General:  A+Ox3  Cardiovascular: rrr  Respiratory: clear anterior  Abdomen: +Bs,soft, NT  Musculoskeletal: pain in hip   Data Reviewed: Basic Metabolic Panel:  Recent Labs Lab 11/12/12 1420 11/13/12 0017 11/14/12 0540 11/15/12 0500 11/16/12 0510  NA 139  --  139 135 135  K 4.4  --  3.8 5.1 5.8*  CL 102  --  102 99 98  CO2 26  --  26 21 21   GLUCOSE 84  --  103* 287* 362*  BUN 22  --  17 27* 43*  CREATININE 0.89 0.86 0.86 1.04 1.33*  CALCIUM 9.3  --  8.2* 8.5 8.4   Liver Function Tests:  Recent Labs Lab 11/12/12 1420  AST  31  ALT 17  ALKPHOS 65  BILITOT 0.7  PROT 7.0  ALBUMIN 4.0   No results found for this basename: LIPASE, AMYLASE,  in the last 168 hours No results found for this basename: AMMONIA,  in the last 168 hours CBC:  Recent Labs Lab 11/12/12 1420 11/13/12 0017 11/14/12 0540 11/15/12 0500 11/16/12 0510  WBC 10.4 6.6 5.2 7.6 5.8  NEUTROABS 8.4*  --   --   --   --   HGB 12.5 10.3* 9.0* 8.1* 7.8*  HCT 35.4* 30.5* 27.5* 24.1* 23.7*  MCV 94.1 95.6 97.2 97.2 97.5  PLT 218 180 162 153 169   Cardiac Enzymes: No results found for this basename: CKTOTAL, CKMB, CKMBINDEX, TROPONINI,  in the last 168 hours BNP (last 3 results) No results found for this basename: PROBNP,  in the last 8760 hours CBG:  Recent Labs Lab 11/15/12 0648 11/15/12 1113 11/15/12 1609 11/15/12 2123 11/16/12 0646  GLUCAP 271* 269* 155* 181* 418*    Recent Results (from the past 240 hour(s))  SURGICAL PCR SCREEN     Status: None   Collection Time  11/13/12  4:14 AM      Result Value Range Status   MRSA, PCR NEGATIVE  NEGATIVE Final   Staphylococcus aureus NEGATIVE  NEGATIVE Final   Comment:            The Xpert SA Assay (FDA     approved for NASAL specimens     in patients over 55 years of age),     is one component of     a comprehensive surveillance     program.  Test performance has     been validated by [employer] for patients greater     than or equal to 66 year old.     It is not intended     to diagnose infection nor to     guide or monitor treatment.     Studies: No results found.  Scheduled Meds: . ALPRAZolam  0.5 mg Oral BID  . aspirin EC  325 mg Oral BID  . docusate sodium  100 mg Oral BID  . ferrous sulfate  325 mg Oral BID WC  . furosemide  20 mg Oral Daily  . insulin aspart  0-15 Units Subcutaneous TID WC  . insulin aspart  0-5 Units Subcutaneous QHS  . insulin glargine  15 Units Subcutaneous q morning - 10a  . levothyroxine  50 mcg Oral QAC breakfast  . lisinopril  5  mg Oral Daily  . loratadine  10 mg Oral Daily  . metoprolol tartrate  12.5 mg Oral BID  . mineral oil  1 enema Rectal Once  . polyethylene glycol  17 g Oral Daily  . simvastatin  40 mg Oral QPM  . sodium chloride  3 mL Intravenous Q12H   Continuous Infusions:    Active Problems:   DM   HYPERLIPIDEMIA   Essential hypertension, benign   DYSPNEA   Coronary atherosclerosis of native coronary artery   PAF (paroxysmal atrial fibrillation)   Fx intertrochanteric hip    Time spent: 25    [facility], Colby Reels  [facility] Pager [phone]. If 7PM-7AM, please contact night-coverage at [email or web address], password [facility] 11/16/2012, 8:32 AM  LOS: 4 days

## 2012-11-16 NOTE — Progress Notes (Addendum)
Patient with complaints of indigestion this am.  Maalox given, but patient with continued complaints of feeling nauseous, and pain generalized across the abdomen as well as the chest region.  Nausea and pain medication given.  Patient states she had been having this pain for 2 days. Unable to state whether she feels it is actually chest pain or indigestion as she so first stated.  Patient not diaphoretic, but anxious with slightly labored breathing. 2L O2 placed on patient with positive effect.  Vitals and EKG obtained, EKG results showing NSR, with incomplete right bundle branch block, and possible anterior infarct, age undetermined.  Breakfast CBG was 418, 15 units of Novolog given per sliding scale.  Spoke with rapid response whom said they would be up to see her this am. Potassium this am was 5.8.  Spoke with Dr. Benjamine Mola, orders for a troponin I q6h x3, to d/c potassium and to not give any additional insulin for her CBG over 400.  Patient still complaining of generalized pain to the abdomen and chest, vomited small amount, now slightly diaphoretic and shaky.  Emotional support provided.  Vitals obtained again. BP 90/48, pulse ox at 100% on 2L.  Dr. Benjamine Mola up to see patient and aware.  Will continue to monitor.

## 2012-11-16 NOTE — Progress Notes (Signed)
Seen and agreed 11/16/2012 Robinette, Julia Elizabeth PTA [phone] pager [phone] office    

## 2012-11-17 ENCOUNTER — Encounter (HOSPITAL_COMMUNITY): Payer: Self-pay | Admitting: General Practice

## 2012-11-17 LAB — CBC
HCT: 31.9 % — ABNORMAL LOW (ref 36.0–46.0)
MCHC: 34.5 g/dL (ref 30.0–36.0)
MCV: 91.1 fL (ref 78.0–100.0)
Platelets: 170 10*3/uL (ref 150–400)
RDW: 15.4 % (ref 11.5–15.5)
WBC: 6.6 10*3/uL (ref 4.0–10.5)

## 2012-11-17 LAB — TYPE AND SCREEN: Antibody Screen: NEGATIVE

## 2012-11-17 LAB — GLUCOSE, CAPILLARY
Glucose-Capillary: 145 mg/dL — ABNORMAL HIGH (ref 70–99)
Glucose-Capillary: 162 mg/dL — ABNORMAL HIGH (ref 70–99)
Glucose-Capillary: 146 mg/dL — ABNORMAL HIGH (ref 70–99)

## 2012-11-17 LAB — BASIC METABOLIC PANEL
BUN: 35 mg/dL — ABNORMAL HIGH (ref 6–23)
Calcium: 8 mg/dL — ABNORMAL LOW (ref 8.4–10.5)
Chloride: 104 mEq/L (ref 96–112)
Creatinine, Ser: 1.06 mg/dL (ref 0.50–1.10)
GFR calc Af Amer: 60 mL/min — ABNORMAL LOW (ref >90)
GFR calc non Af Amer: 52 mL/min — ABNORMAL LOW (ref >90)

## 2012-11-17 LAB — TROPONIN I: Troponin I: <0.3 ng/mL (ref <0.30)

## 2012-11-17 MED ORDER — METOCLOPRAMIDE HCL 5 MG/ML IJ SOLN
5.0000 mg | Freq: Three times a day (TID) | INTRAMUSCULAR | Status: DC
  Administered 2012-11-17 – 2012-11-18 (×2): 5 mg via INTRAVENOUS
  Filled 2012-11-17: qty 1
  Filled 2012-11-17: qty 2
  Filled 2012-11-17: qty 1
  Filled 2012-11-17: qty 2
  Filled 2012-11-17: qty 1

## 2012-11-17 NOTE — Progress Notes (Signed)
Physical Therapy Treatment Patient Details Name: Ellen Woods MRN: [id number] DOB: 23-Dec-1940 Today's Date: 11/17/2012 Time: 4098-1191 PT Time Calculation (min): 26 min  PT Assessment / Plan / Recommendation  PT Comments   Pt slowly making progress toward PT goals. Pt able to ambulate this Tx session and perform therapeutic exercise. Pt continues to be limited by fatigue, nausea, and possible cognitive deficits. Pt will greatly benefit from SNF in order to increase her functional abilities.  Follow Up Recommendations  SNF     Does the patient have the potential to tolerate intense rehabilitation     Barriers to Discharge        Equipment Recommendations  Rolling walker with 5" wheels    Recommendations for Other Services    Frequency Min 6X/week   Progress towards PT Goals Progress towards PT goals: Progressing toward goals  Plan Current plan remains appropriate    Precautions / Restrictions Precautions Precautions: Fall Restrictions Weight Bearing Restrictions: Yes LLE Weight Bearing: Partial weight bearing LLE Partial Weight Bearing Percentage or Pounds: 30%   Pertinent Vitals/Pain Pt reported the pain in her LLE as 4/10.    Mobility  Bed Mobility Bed Mobility: Not assessed Transfers Transfers: Sit to Stand;Stand to Sit Sit to Stand: 3: Mod assist;With upper extremity assist;With armrests;From chair/3-in-1 Stand to Sit: 3: Mod assist;With upper extremity assist;With armrests;To chair/3-in-1 Details for Transfer Assistance: pt requires min assist to achieve a standing position and needs frequent VC for hand placement and sequencing Ambulation/Gait Ambulation/Gait Assistance: 3: Mod assist Ambulation Distance (Feet): 15 Feet Assistive device: Rolling walker Ambulation/Gait Assistance Details: Repeated VC for RW sequencing and physical assist to maintain upright position. Gait Pattern: Step-to pattern;Antalgic;Trunk flexed Gait velocity: decreased Research officer, political party: No    Exercises General Exercises - Lower Extremity Long Arc Quad: AAROM;Left;10 reps;Seated Hip ABduction/ADduction: AAROM;Left;10 reps;Seated Straight Leg Raises: AAROM;Left;10 reps;Seated Hip Flexion/Marching: AAROM;Left;10 reps;Seated     PT Goals (current goals can now be found in the care plan section) Acute Rehab PT Goals Time For Goal Achievement: 11/21/12 Potential to Achieve Goals: Good  Visit Information  Last PT Received On: 11/17/12 Assistance Needed: +1 History of Present Illness: Pt sustained L femur fx from a fall in [place].  Daughter feels the fall was the result of pt having low blood sugar.  She underwent IM nailing yesterday, 11-13-12.    Subjective Data      Cognition  Cognition Arousal/Alertness: Awake/alert Behavior During Therapy: WFL for tasks assessed/performed Overall Cognitive Status: Difficult to assess Area of Impairment: Following commands Memory: Decreased recall of precautions Following Commands: Follows one step commands inconsistently;Follows one step commands with increased time Difficult to assess due to: Impaired communication    Balance     End of Session PT - End of Session Equipment Utilized During Treatment: Gait belt Activity Tolerance: Patient limited by lethargy;Patient limited by pain Patient left: in chair;with call bell/phone within reach Nurse Communication: Mobility status   GP     Jolyn Nap, SPTA 11/17/2012, 2:25 PM

## 2012-11-17 NOTE — Progress Notes (Signed)
Subjective: 4 Days Post-Op Procedure(s) (LRB): INTRAMEDULLARY (IM) NAIL INTERTROCHANTRIC LEFT HIP (Left) HGB improved. Less n/v Activity level:  30% pwb left Diet tolerance:  Eating better Voiding:  ok Patient reports pain as 3 on 0-10 scale.    Objective: Vital signs in last 24 hours: Temp:  [98.2 F (36.8 C)-99.2 F (37.3 C)] 98.2 F (36.8 C) (07/16 0646) Pulse Rate:  [70-88] 70 (07/16 0646) Resp:  [15-18] 15 (07/16 0800) BP: (104-134)/(36-54) 122/54 mmHg (07/16 0646) SpO2:  [97 %-98 %] 98 % (07/16 0800)  Labs:  Recent Labs  11/15/12 0500 11/16/12 0510 11/16/12 1909 11/17/12 0140  HGB 8.1* 7.8* 11.8* 11.0*    Recent Labs  11/16/12 0510 11/16/12 1909 11/17/12 0140  WBC 5.8  --  6.6  RBC 2.43*  --  3.50*  HCT 23.7* 34.0* 31.9*  PLT 169  --  170    Recent Labs  11/16/12 0510 11/17/12 0140  NA 135 140  K 5.8* 4.1  CL 98 104  CO2 21 24  BUN 43* 35*  CREATININE 1.33* 1.06  GLUCOSE 362* 115*  CALCIUM 8.4 8.0*   No results found for this basename: LABPT, INR,  in the last 72 hours  Physical Exam:  Neurologically intact Neurovascular intact Sensation intact distally Intact pulses distally Dorsiflexion/Plantar flexion intact Incision: scant drainage No cellulitis present Compartment soft new dressing  Assessment/Plan:  4 Days Post-Op Procedure(s) (LRB): INTRAMEDULLARY (IM) NAIL INTERTROCHANTRIC LEFT HIP (Left) Up with therapy Discharge home with home health vs SNF per Dr.Vann PWB left leg ASA 325 BID x 4 weeks from surg./scd's Dressing change as needed rto 2 weeks Dr. Jerl Santos [phone]    Vernon Ariel R 11/17/2012, 12:48 PM

## 2012-11-17 NOTE — Progress Notes (Signed)
[facility] PROGRESS NOTE  Ellen Woods GNF:[id number] DOB: 04-04-1941 DOA: 11/12/2012 PCP: Rudi Heap, MD  Assessment/Plan: 1. Left hip fracture- s/p ORIF;  PT/OT-home health, incentive spirometry 2. N/V- improved but still no BM- x ray showed colon full of stool, +BS- continue miralax, colase, ambulate patient - make reglan ATC to stimulate her bowels after surgery 3. Chest discomfort- resolved; have cycled CE negative; s/p 2 units with no further CP 4. DM- SSI- home lantus 5. HLD- home meds 6. HTN- home meds with holding parameters 7. CAD- cycle CE 8. ABLA- follow and transfuse as needed for < 8- added Fe daily- tranfused 2 units 7/15  D/c foley  Code Status: full Family Communication: patient and family at bedside Disposition Plan: SNF- worked with PT again and they recommend SNF- lives with daughter who is disabled   Consultants:  ortho  Procedures:  ORIF 7/12  Blood transfusion    HPI/Subjective: C/o nausea No BM still   Objective: Filed Vitals:   11/16/12 1831 11/16/12 2154 11/17/12 0646 11/17/12 0800  BP: 119/43 129/48 122/54   Pulse: 88 79 70   Temp: 98.6 F (37 C) 99.2 F (37.3 C) 98.2 F (36.8 C)   TempSrc: Oral Oral Oral   Resp: 16 16 15 15   Height:      Weight:      SpO2:  97% 98% 98%    Intake/Output Summary (Last 24 hours) at 11/17/12 1110 Last data filed at 11/17/12 0900  Gross per 24 hour  Intake    740 ml  Output    200 ml  Net    540 ml   Filed Weights   11/12/12 1257  Weight: 55.792 kg (123 lb)    Exam:   General:  A+Ox3  Cardiovascular: rrr  Respiratory: clear anterior  Abdomen: +Bs,soft, NT  Musculoskeletal: pain in hip   Data Reviewed: Basic Metabolic Panel:  Recent Labs Lab 11/12/12 1420 11/13/12 0017 11/14/12 0540 11/15/12 0500 11/16/12 0510 11/17/12 0140  NA 139  --  139 135 135 140  K 4.4  --  3.8 5.1 5.8* 4.1  CL 102  --  102 99 98 104  CO2 26  --  26 21 21 24   GLUCOSE 84  --  103*  287* 362* 115*  BUN 22  --  17 27* 43* 35*  CREATININE 0.89 0.86 0.86 1.04 1.33* 1.06  CALCIUM 9.3  --  8.2* 8.5 8.4 8.0*   Liver Function Tests:  Recent Labs Lab 11/12/12 1420  AST 31  ALT 17  ALKPHOS 65  BILITOT 0.7  PROT 7.0  ALBUMIN 4.0   No results found for this basename: LIPASE, AMYLASE,  in the last 168 hours No results found for this basename: AMMONIA,  in the last 168 hours CBC:  Recent Labs Lab 11/12/12 1420 11/13/12 0017 11/14/12 0540 11/15/12 0500 11/16/12 0510 11/16/12 1909 11/17/12 0140  WBC 10.4 6.6 5.2 7.6 5.8  --  6.6  NEUTROABS 8.4*  --   --   --   --   --   --   HGB 12.5 10.3* 9.0* 8.1* 7.8* 11.8* 11.0*  HCT 35.4* 30.5* 27.5* 24.1* 23.7* 34.0* 31.9*  MCV 94.1 95.6 97.2 97.2 97.5  --  91.1  PLT 218 180 162 153 169  --  170   Cardiac Enzymes:  Recent Labs Lab 11/16/12 0748 11/16/12 1909 11/17/12 0140  TROPONINI <0.30 <0.30 <0.30   BNP (last 3 results) No results found for this  basename: PROBNP,  in the last 8760 hours CBG:  Recent Labs Lab 11/16/12 0857 11/16/12 1120 11/16/12 1552 11/16/12 2152 11/17/12 0645  GLUCAP 322* 248* 153* 101* 213*    Recent Results (from the past 240 hour(s))  SURGICAL PCR SCREEN     Status: None   Collection Time    11/13/12  4:14 AM      Result Value Range Status   MRSA, PCR NEGATIVE  NEGATIVE Final   Staphylococcus aureus NEGATIVE  NEGATIVE Final   Comment:            The Xpert SA Assay (FDA     approved for NASAL specimens     in patients over 27 years of age),     is one component of     a comprehensive surveillance     program.  Test performance has     been validated by [employer] for patients greater     than or equal to 53 year old.     It is not intended     to diagnose infection nor to     guide or monitor treatment.     Studies: Dg Abd 2 Views  7/72/2014   *RADIOLOGY REPORT*  Clinical Data: Nausea, vomiting, abdominal pain, history hypertension, diabetes  ABDOMEN - 2 VIEW   Comparison: 08/09/2010  Findings: Scattered vascular calcifications. Increased stool throughout colon. No evidence of bowel obstruction, bowel wall thickening or free intraperitoneal air. Multiple calcified splenic granulomata. Additional calcifications project over the liver likely additional granulomata. Diffuse osseous demineralization. No definite urinary tract calcification. Orthopedic hardware proximal left femur.  IMPRESSION: Increased stool throughout colon. Old granulomatous disease.   Original Report Authenticated By: Ulyses Southward, M.D.    Scheduled Meds: . ALPRAZolam  0.5 mg Oral BID  . aspirin EC  325 mg Oral BID  . docusate sodium  100 mg Oral BID  . ferrous sulfate  325 mg Oral BID WC  . insulin aspart  0-15 Units Subcutaneous TID WC  . insulin aspart  0-5 Units Subcutaneous QHS  . insulin glargine  15 Units Subcutaneous q morning - 10a  . levothyroxine  50 mcg Oral QAC breakfast  . lisinopril  5 mg Oral Daily  . loratadine  10 mg Oral Daily  . metoCLOPramide (REGLAN) injection  5 mg Intravenous Q8H  . metoprolol tartrate  12.5 mg Oral BID  . polyethylene glycol  17 g Oral Daily  . simvastatin  40 mg Oral QPM  . sodium chloride  3 mL Intravenous Q12H   Continuous Infusions: . sodium chloride 50 mL/hr at 11/16/12 0908    Active Problems:   DM   HYPERLIPIDEMIA   Essential hypertension, benign   DYSPNEA   Coronary atherosclerosis of native coronary artery   PAF (paroxysmal atrial fibrillation)   Fx intertrochanteric hip    Time spent: 25    Marlin Canary  [facility] Pager [phone]. If 7PM-7AM, please contact night-coverage at [email or web address], password [facility] 11/17/2012, 11:10 AM  LOS: 5 days

## 2012-11-17 NOTE — Progress Notes (Signed)
Seen and agreed 11/17/2012 Robinette, Julia Elizabeth PTA [phone] pager [phone] office    

## 2012-11-18 DIAGNOSIS — F411 Generalized anxiety disorder: Secondary | ICD-10-CM

## 2012-11-18 LAB — GLUCOSE, CAPILLARY: Glucose-Capillary: 217 mg/dL — ABNORMAL HIGH (ref 70–99)

## 2012-11-18 MED ORDER — METOCLOPRAMIDE HCL 5 MG PO TABS: 5.0000 mg | ORAL_TABLET | Freq: Three times a day (TID) | ORAL | Status: DC | PRN

## 2012-11-18 MED ORDER — INSULIN REGULAR HUMAN 100 UNIT/ML IJ SOLN: INTRAMUSCULAR | Status: DC

## 2012-11-18 MED ORDER — METOPROLOL TARTRATE 25 MG PO TABS: 12.5000 mg | ORAL_TABLET | Freq: Two times a day (BID) | ORAL | Status: DC

## 2012-11-18 MED ORDER — METHOCARBAMOL 500 MG PO TABS: 500.0000 mg | ORAL_TABLET | Freq: Four times a day (QID) | ORAL | Status: DC | PRN

## 2012-11-18 MED ORDER — ASPIRIN EC 325 MG PO TBEC: 325.0000 mg | DELAYED_RELEASE_TABLET | Freq: Two times a day (BID) | ORAL | Status: DC

## 2012-11-18 MED ORDER — ACETAMINOPHEN 325 MG PO TABS: 650.0000 mg | ORAL_TABLET | Freq: Four times a day (QID) | ORAL | Status: AC | PRN

## 2012-11-18 MED ORDER — FERROUS SULFATE 325 (65 FE) MG PO TABS: 325.0000 mg | ORAL_TABLET | Freq: Two times a day (BID) | ORAL | Status: DC

## 2012-11-18 MED ORDER — HYDROCODONE-ACETAMINOPHEN 5-325 MG PO TABS: 1.0000 | ORAL_TABLET | Freq: Four times a day (QID) | ORAL | Status: DC | PRN

## 2012-11-18 MED ORDER — ALPRAZOLAM 0.5 MG PO TABS: 0.5000 mg | ORAL_TABLET | Freq: Two times a day (BID) | ORAL | Status: DC

## 2012-11-18 MED ORDER — INSULIN GLARGINE 100 UNIT/ML ~~LOC~~ SOLN: 15.0000 [IU] | Freq: Every morning | SUBCUTANEOUS | Status: DC

## 2012-11-18 MED ORDER — ONDANSETRON HCL 4 MG PO TABS: 4.0000 mg | ORAL_TABLET | Freq: Four times a day (QID) | ORAL | Status: DC | PRN

## 2012-11-18 MED ORDER — POLYETHYLENE GLYCOL 3350 17 G PO PACK: 17.0000 g | PACK | Freq: Every day | ORAL | Status: DC

## 2012-11-18 MED ORDER — DSS 100 MG PO CAPS: 100.0000 mg | ORAL_CAPSULE | Freq: Two times a day (BID) | ORAL | Status: DC

## 2012-11-18 NOTE — Progress Notes (Signed)
Agree with change in D/C disposition by PTA and OT due to decreased safety and caregiver assistance at home.  [place], [place], [place] [phone]

## 2012-11-18 NOTE — Progress Notes (Signed)
Occupational Therapy Treatment Patient Details Name: Ellen Woods MRN: [id number] DOB: [birth year] Today's Date: 11/18/2012 Time: 4098-1191 OT Time Calculation (min): 25 min  OT Assessment / Plan / Recommendation  OT comments  Educated pt in use of AE for LB ADL, performed toileting, and stood briefly at sink for grooming.  Pt is progressing, but slowly.  Plan is for SNF for further rehab prior to return home  Follow Up Recommendations  SNF;Supervision/Assistance - 24 hour    Barriers to Discharge       Equipment Recommendations       Recommendations for Other Services    Frequency     Progress towards OT Goals Progress towards OT goals: Progressing toward goals  Plan Discharge plan remains appropriate    Precautions / Restrictions Precautions Precautions: Fall Restrictions LLE Weight Bearing: Partial weight bearing LLE Partial Weight Bearing Percentage or Pounds: 30%   Pertinent Vitals/Pain No apparent distress    ADL  Grooming: Performed;Wash/dry face;Min guard Where Assessed - Grooming: Supported standing Statistician: Minimal Dentist Method: Sit to Barista: Raised toilet seat with arms (or 3-in-1 over toilet) Toileting - Clothing Manipulation and Hygiene: Min guard Where Assessed - Toileting Clothing Manipulation and Hygiene: Sit to stand from 3-in-1 or toilet Equipment Used: Gait belt;Rolling walker Transfers/Ambulation Related to ADLs: min assist for ambulation short distance with RW, very slow ADL Comments: Educated pt in availability and use of AE for LB ADL.    OT Diagnosis:    OT Problem List:   OT Treatment Interventions:     OT Goals(current goals can now be found in the care plan section) Acute Rehab OT Goals Patient Stated Goal: Return home.  Visit Information  Last OT Received On: 11/18/12 Assistance Needed: +1 History of Present Illness: Pt sustained L femur fx from a fall in [place].  Daughter  feels the fall was the result of pt having low blood sugar.  She underwent IM nailing yesterday, 11-13-12.    Subjective Data      Prior Functioning       Cognition  Cognition Arousal/Alertness: Awake/alert Behavior During Therapy: WFL for tasks assessed/performed Overall Cognitive Status: Difficult to assess Area of Impairment: Following commands Memory: Decreased recall of precautions Following Commands: Follows one step commands inconsistently;Follows one step commands with increased time Difficult to assess due to: Impaired communication    Mobility  Bed Mobility Bed Mobility: Not assessed Transfers Sit to Stand: With upper extremity assist;With armrests;From chair/3-in-1;4: Min assist Stand to Sit: With upper extremity assist;With armrests;To chair/3-in-1;4: Min assist Details for Transfer Assistance: pt requires min assist to achieve a standing position and needs frequent VC for hand placement and sequencing    Exercises      Balance     End of Session OT - End of Session Activity Tolerance: Patient tolerated treatment well Patient left: in chair;with call bell/phone within reach  GO     Evern Bio 11/18/2012, 1:49 PM [phone]

## 2012-11-18 NOTE — Progress Notes (Signed)
Subjective: 5 Days Post-Op Procedure(s) (LRB): INTRAMEDULLARY (IM) NAIL INTERTROCHANTRIC LEFT HIP (Left)  Activity level:  Up with therapy 30% weightbearing left Diet tolerance:  Eating with mild nausea Voiding:  Okay Patient reports pain as 2 on 0-10 scale.    Objective: Vital signs in last 24 hours: Temp:  [98.2 F (36.8 C)-99.8 F (37.7 C)] 99.8 F (37.7 C) (07/17 0700) Pulse Rate:  [87-98] 98 (07/17 0700) Resp:  [16-18] 18 (07/17 0800) BP: (124-145)/(48-64) 138/48 mmHg (07/17 0700) SpO2:  [93 %-97 %] 97 % (07/17 0700)  Labs:  Recent Labs  11/16/12 0510 11/16/12 1909 11/17/12 0140  HGB 7.8* 11.8* 11.0*    Recent Labs  11/16/12 0510 11/16/12 1909 11/17/12 0140  WBC 5.8  --  6.6  RBC 2.43*  --  3.50*  HCT 23.7* 34.0* 31.9*  PLT 169  --  170    Recent Labs  11/16/12 0510 11/17/12 0140  NA 135 140  K 5.8* 4.1  CL 98 104  CO2 21 24  BUN 43* 35*  CREATININE 1.33* 1.06  GLUCOSE 362* 115*  CALCIUM 8.4 8.0*   No results found for this basename: LABPT, INR,  in the last 72 hours  Physical Exam:  Neurologically intact ABD soft Neurovascular intact Sensation intact distally Intact pulses distally Dorsiflexion/Plantar flexion intact Incision: dressing C/D/I No cellulitis present Compartment soft  Assessment/Plan:  5 Days Post-Op Procedure(s) (LRB): INTRAMEDULLARY (IM) NAIL INTERTROCHANTRIC LEFT HIP (Left) Advance diet Up with therapy Discharge to SNF 30% partial weightbearing left leg. ASA 325. One twice a day for 4 weeks. Norco when necessary pain. Return to Dr. Nolon Nations office in 2 weeks. May change dressing as needed.    Ellen Woods R 11/18/2012, 1:01 PM

## 2012-11-18 NOTE — Progress Notes (Signed)
Physical Therapy Treatment Patient Details Name: Ellen Woods MRN: [id number] DOB: 1941-02-13 Today's Date: 11/18/2012 Time: 1123-1208 PT Time Calculation (min): 45 min  PT Assessment / Plan / Recommendation  PT Comments   Focused on ambulation this session. Continued to recommend SNF. Decreased frequency to 3x/wk due to department protocol  Follow Up Recommendations  SNF     Does the patient have the potential to tolerate intense rehabilitation     Barriers to Discharge        Equipment Recommendations  Rolling walker with 5" wheels    Recommendations for Other Services    Frequency Min 3X/week   Progress towards PT Goals Progress towards PT goals: Progressing toward goals  Plan Current plan remains appropriate    Precautions / Restrictions Precautions Precautions: Fall Restrictions LLE Weight Bearing: Partial weight bearing LLE Partial Weight Bearing Percentage or Pounds: 30%   Pertinent Vitals/Pain no apparent distress     Mobility  Transfers Sit to Stand: With upper extremity assist;With armrests;From chair/3-in-1;4: Min assist Stand to Sit: With upper extremity assist;With armrests;To chair/3-in-1;4: Min assist Details for Transfer Assistance: pt requires min assist to achieve a standing position and needs frequent VC for hand placement and sequencing Ambulation/Gait Ambulation/Gait Assistance: 4: Min assist Ambulation Distance (Feet): 60 Feet Assistive device: Rolling walker Ambulation/Gait Assistance Details: Cues for upright posture. Patient doing well with WBing status Gait Pattern: Step-to pattern;Antalgic;Trunk flexed Gait velocity: decreased    Exercises     PT Diagnosis:    PT Problem List:   PT Treatment Interventions:     PT Goals (current goals can now be found in the care plan section)    Visit Information  Last PT Received On: 11/18/12 Assistance Needed: +1 History of Present Illness: Pt sustained L femur fx from a fall in [place].   Daughter feels the fall was the result of pt having low blood sugar.  She underwent IM nailing yesterday, 11-13-12.    Subjective Data      Cognition  Cognition Arousal/Alertness: Awake/alert Behavior During Therapy: WFL for tasks assessed/performed Overall Cognitive Status: Difficult to assess Area of Impairment: Following commands Memory: Decreased recall of precautions Following Commands: Follows one step commands inconsistently;Follows one step commands with increased time Difficult to assess due to: Impaired communication    Balance     End of Session PT - End of Session Equipment Utilized During Treatment: Gait belt Activity Tolerance: Patient tolerated treatment well Patient left: in chair;with call bell/phone within reach Nurse Communication: Mobility status   GP     Fredrich Birks 11/18/2012, 1:24 PM  11/18/2012 Fredrich Birks PTA [phone] pager [phone] office

## 2012-11-18 NOTE — Discharge Summary (Signed)
Physician Discharge Summary  Patient ID: Ellen Woods MRN: [id number] DOB/AGE: 1941-01-03 72 y.o.  Admit date: 11/12/2012 Discharge date: 11/18/2012  Primary Care Physician:  Rudi Heap, MD  Discharge Diagnoses:   Left hip fracture status post Intramedullary nail intertrochanteric left hip  . Coronary atherosclerosis of native coronary artery . HYPERLIPIDEMIA . Essential hypertension, benign . PAF (paroxysmal atrial fibrillation) . DM . DYSPNEA Constipation  Consults:  Orthopedics, Dr Jerl Santos   Recommendations for Outpatient Follow-up:  1. Up with therapy partial weightbearing left side.  2. Continue ASA 325. 1 pill twice a day x4 weeks, then continue 325mg  daily. 3. continue bowel regimen while on narcotics for the pain   Allergies:   Allergies  Allergen Reactions  . Azithromycin Other (See Comments)    Hospital reaction  . Codeine     REACTION: nausea     Discharge Medications:   Medication List    STOP taking these medications       furosemide 20 MG tablet  Commonly known as:  LASIX     potassium chloride SA 20 MEQ tablet  Commonly known as:  K-DUR,KLOR-CON      TAKE these medications       acetaminophen 325 MG tablet  Commonly known as:  TYLENOL  Take 2 tablets (650 mg total) by mouth every 6 (six) hours as needed.     alendronate 70 MG tablet  Commonly known as:  FOSAMAX  Take 70 mg by mouth every 7 (seven) days. Take with a full glass of water on an empty stomach.     ALPRAZolam 0.5 MG tablet  Commonly known as:  XANAX  Take 1 tablet (0.5 mg total) by mouth 2 (two) times daily.     aspirin EC 325 MG tablet  Take 1 tablet (325 mg total) by mouth 2 (two) times daily.     baclofen 10 MG tablet  Commonly known as:  LIORESAL  Take 1 tablet (10 mg total) by mouth 3 (three) times daily.     cetirizine 10 MG tablet  Commonly known as:  ZYRTEC  Take 1 tablet (10 mg total) by mouth daily.     DSS 100 MG Caps  Take 100 mg by mouth 2 (two)  times daily.     ferrous sulfate 325 (65 FE) MG tablet  Take 1 tablet (325 mg total) by mouth 2 (two) times daily with a meal.     HYDROcodone-acetaminophen 5-325 MG per tablet  Commonly known as:  NORCO/VICODIN  Take 1-2 tablets by mouth every 6 (six) hours as needed for pain.     insulin glargine 100 UNIT/ML injection  Commonly known as:  LANTUS  Inject 0.15 mLs (15 Units total) into the skin every morning.     Insulin Pen Needle 31G X 5 MM Misc  Commonly known as:  B-D UF III MINI PEN NEEDLES  1 Stick by Does not apply route QID.     insulin regular 100 units/mL injection  Commonly known as:  NOVOLIN R,HUMULIN R  Sliding scale : CBG < 70: Drink juice; CBG 70 - 120: 0 units: CBG 121 - 150: 2 units; CBG 151 - 200: 3 units; CBG 201 - 250: 5 units; CBG 251 - 300: 8 units;CBG 301 - 350: 11 units; CBG 351 - 400: 15 units; CBG > 400 : 15 units and notify MD     levothyroxine 50 MCG tablet  Commonly known as:  SYNTHROID, LEVOTHROID  Take 1 tablet (50 mcg total)  by mouth daily.     lisinopril 5 MG tablet  Commonly known as:  PRINIVIL,ZESTRIL  Take 1 tablet (5 mg total) by mouth daily.     methocarbamol 500 MG tablet  Commonly known as:  ROBAXIN  Take 1 tablet (500 mg total) by mouth every 6 (six) hours as needed.     metoCLOPramide 5 MG tablet  Commonly known as:  REGLAN  Take 1 tablet (5 mg total) by mouth every 8 (eight) hours as needed (if ondansetron (ZOFRAN) ineffective.).     metoprolol tartrate 25 MG tablet  Commonly known as:  LOPRESSOR  Take 0.5 tablets (12.5 mg total) by mouth 2 (two) times daily.     multivitamin tablet  Take 1 tablet by mouth daily.     NITROSTAT 0.4 MG SL tablet  Generic drug:  nitroGLYCERIN  Place 1 tablet (0.4 mg total) under the tongue every 5 (five) minutes as needed for chest pain.     ondansetron 4 MG tablet  Commonly known as:  ZOFRAN  Take 1 tablet (4 mg total) by mouth every 6 (six) hours as needed for nausea.     polyethylene  glycol packet  Commonly known as:  MIRALAX / GLYCOLAX  Take 17 g by mouth daily. Hold if diarrhea     PRODIGY NO CODING BLOOD GLUC test strip  Generic drug:  glucose blood     simvastatin 40 MG tablet  Commonly known as:  ZOCOR  Take 1 tablet (40 mg total) by mouth every evening.     vitamin B-12 1000 MCG tablet  Commonly known as:  CYANOCOBALAMIN  Take 1 tablet (1,000 mcg total) by mouth daily.         Brief H and P: For complete details please refer to admission H and P, but in briefGlenda J Woods is an 72 y.o. female with hx of CAD (last cath 2012 with 50 percent LAD, DM, Hypothyroidism, baseline DOE, fell and suffered a left hip Fx. She was seen in consultation with Ortho and has planned to have ORIF tomorrow at 13:00 hour. She denied chest pain, palpitation, fever or chills. She does have DOE, but this is not new, and she has been able to work on her yard for several hours. Work up included no leukocytosis, unremarkable renal fx tests, and electrolytes. Her BS was 14 orginally, and she was given D10, which was later discontinued. Hospitalist was asked to admit her for hip Fx.   Hospital Course:   1. Left hip fracture- she was admitted by hospitalist service and orthopedics was consulted. Patient underwent ORIF and left intramedullary nail for hip fracture. Patient was closely followed by orthopedics. Patient is recommended skilled nursing facility for physical therapy. 2. N/V with constipation- continue miralax, colace. It is important that patient states on laxatives while on narcotics. 3. Chest discomfort- resolved; have cycled CE negative; s/p 2 units with no further CP 4. DM- tinea sliding scale insulin and lantus 5. HLD- home meds 6. HTN- continue outpatient medications 7. CAD-cardiac enzymes remain negative, no acute issues 8. ABLA- after the surgery. Hb 11.0 at discharge. Added Fe daily. Patient was transfused 2 units on 7/15     Day of Discharge BP 138/48  Pulse  98  Temp(Src) 99.8 F (37.7 C) (Oral)  Resp 18  Ht 4\' 11"  (1.499 m)  Wt 55.792 kg (123 lb)  BMI 24.83 kg/m2  SpO2 97%  Physical Exam: General: Alert and awake oriented x3 not in any acute distress.  CVS: S1-S2 clear no murmur rubs or gallops Chest: clear to auscultation bilaterally, no wheezing rales or rhonchi Abdomen: soft nontender, nondistended, normal bowel sounds Extremities: no cyanosis, clubbing or edema noted bilaterally    The results of significant diagnostics from this hospitalization (including imaging, microbiology, ancillary and laboratory) are listed below for reference.    LAB RESULTS: Basic Metabolic Panel:  Recent Labs Lab 11/16/12 0510 11/17/12 0140  NA 135 140  K 5.8* 4.1  CL 98 104  CO2 21 24  GLUCOSE 362* 115*  BUN 43* 35*  CREATININE 1.33* 1.06  CALCIUM 8.4 8.0*   Liver Function Tests:  Recent Labs Lab 11/12/12 1420  AST 31  ALT 17  ALKPHOS 65  BILITOT 0.7  PROT 7.0  ALBUMIN 4.0   No results found for this basename: LIPASE, AMYLASE,  in the last 168 hours No results found for this basename: AMMONIA,  in the last 168 hours CBC:  Recent Labs Lab 11/12/12 1420  11/16/12 0510 11/16/12 1909 11/17/12 0140  WBC 10.4  < > 5.8  --  6.6  NEUTROABS 8.4*  --   --   --   --   HGB 12.5  < > 7.8* 11.8* 11.0*  HCT 35.4*  < > 23.7* 34.0* 31.9*  MCV 94.1  < > 97.5  --  91.1  PLT 218  < > 169  --  170  < > = values in this interval not displayed. Cardiac Enzymes:  Recent Labs Lab 11/16/12 1909 11/17/12 0140  TROPONINI <0.30 <0.30   BNP: No components found with this basename: POCBNP,  CBG:  Recent Labs Lab 11/18/12 0709 11/18/12 1112  GLUCAP 217* 188*    Significant Diagnostic Studies:  Dg Chest 1 View  11/12/2012   *RADIOLOGY REPORT*  Clinical Data: Fall, left hip fracture  CHEST - 1 VIEW  Comparison: 10/20/2007  Findings: Borderline cardiomegaly.  No acute infiltrate or pleural effusion.  No pulmonary edema.  No diagnostic  pneumothorax. Mild thoracic dextroscoliosis.  IMPRESSION: Borderline cardiomegaly.  No diagnostic pneumothorax.   Original Report Authenticated By: Natasha Mead, M.D.   Dg Hip Complete Left  11/12/2012   *RADIOLOGY REPORT*  Clinical Data: Fall  LEFT HIP - COMPLETE 2+ VIEW  Comparison: None.  Findings: Three views of the left hip submitted.  There is mild displaced intertrochanteric fracture of proximal left femur.  IMPRESSION: Mild displaced intertrochanteric fracture of proximal left femur.   Original Report Authenticated By: Natasha Mead, M.D.   Dg Femur Left  11/13/2012   *RADIOLOGY REPORT*  Clinical Data: Fracture fixation.  DG C-ARM 1-60 MIN - [facility]PT [facility], LEFT FEMUR - 2 VIEW  Technique: Four fluoroscopic intraoperative spot views of the left femur are provided.  Comparison:  Plain films 11/12/2012.  Findings: Images demonstrate placement of a dynamic hip screw and long IM nail with a single distal interlocking screw for fixation of an intertrochanteric fracture.  Hardware appears intact. Position and alignment improved.  There is some degenerative disease about the left hip.  IMPRESSION: ORIF left intertrochanteric fracture.   Original Report Authenticated By: Holley Dexter, M.D.   Dg Pelvis Portable  11/13/2012   *RADIOLOGY REPORT*  Clinical Data: Operative fixation of a left intertrochanteric fracture.  PORTABLE PELVIS  Comparison: Earlier today and yesterday.  Findings: Intramedullary rod and compression screw fixation of the previously demonstrated left intertrochanteric fracture. Essentially anatomic position and alignment.  No additional fractures are seen.  Atheromatous arterial calcifications.  IMPRESSION: Hardware fixation of the previously  demonstrated left intertrochanteric fracture.   Original Report Authenticated By: Beckie Salts, M.D.   Dg C-arm 1-60 Min-no Report  11/13/2012   *RADIOLOGY REPORT*  Clinical Data: Fracture fixation.  DG C-ARM 1-60 MIN - [facility], LEFT FEMUR - 2 VIEW   Technique: Four fluoroscopic intraoperative spot views of the left femur are provided.  Comparison:  Plain films 11/12/2012.  Findings: Images demonstrate placement of a dynamic hip screw and long IM nail with a single distal interlocking screw for fixation of an intertrochanteric fracture.  Hardware appears intact. Position and alignment improved.  There is some degenerative disease about the left hip.  IMPRESSION: ORIF left intertrochanteric fracture.   Original Report Authenticated By: Holley Dexter, M.D.     Disposition and Follow-up:     Discharge Orders   Future Appointments Provider Department Dept Phone   12/03/2012 2:20 PM Laqueta Linden, MD [facility] [place] (near [place]) [phone]   12/29/2012 9:00 AM [facility] [phone]   12/29/2012 9:30 AM [facility]ist [facility] [phone]   Future Orders Complete By Expires     Diet Carb Modified  As directed     Increase activity slowly  As directed     Partial weight bearing  As directed     Comments:      Left leg pwb        DISPOSITION: Skilled nursing facility for rehabilitation DIET: Carb modified diet ACTIVITY: As tolerated per physical therapy    DISCHARGE FOLLOW-UP Follow-up Information   Follow up with Velna Ochs, MD. Call in 2 weeks.   Contact information:   [address] [place] [address] [phone]       Follow up with Rudi Heap, MD. Schedule an appointment as soon as possible for a visit in 2 weeks. (for hospital follow-up)    Contact information:   [address] [place] [address] [phone]       Time spent on Discharge: 40 mins  Signed:   RAI,RIPUDEEP M.D. [facility] 11/18/2012, 12:08 PM Pager: [phone]

## 2012-12-03 ENCOUNTER — Ambulatory Visit: Payer: 59 | Admitting: Cardiovascular Disease

## 2012-12-09 ENCOUNTER — Ambulatory Visit (INDEPENDENT_AMBULATORY_CARE_PROVIDER_SITE_OTHER): Payer: Medicare PPO | Admitting: Family Medicine

## 2012-12-09 ENCOUNTER — Encounter: Payer: Self-pay | Admitting: Family Medicine

## 2012-12-09 VITALS — BP 143/62 | HR 72 | Temp 98.0°F | Ht 59.0 in | Wt 123.0 lb

## 2012-12-09 DIAGNOSIS — R1011 Right upper quadrant pain: Secondary | ICD-10-CM

## 2012-12-09 DIAGNOSIS — M549 Dorsalgia, unspecified: Secondary | ICD-10-CM

## 2012-12-09 DIAGNOSIS — S329XXB Fracture of unspecified parts of lumbosacral spine and pelvis, initial encounter for open fracture: Secondary | ICD-10-CM

## 2012-12-09 DIAGNOSIS — E119 Type 2 diabetes mellitus without complications: Secondary | ICD-10-CM

## 2012-12-09 DIAGNOSIS — I10 Essential (primary) hypertension: Secondary | ICD-10-CM

## 2012-12-09 DIAGNOSIS — S7292XS Unspecified fracture of left femur, sequela: Secondary | ICD-10-CM

## 2012-12-09 DIAGNOSIS — S8290XS Unspecified fracture of unspecified lower leg, sequela: Secondary | ICD-10-CM

## 2012-12-09 MED ORDER — BACLOFEN 10 MG PO TABS: 10.0000 mg | ORAL_TABLET | Freq: Three times a day (TID) | ORAL | Status: DC

## 2012-12-09 MED ORDER — METHOCARBAMOL 500 MG PO TABS: 500.0000 mg | ORAL_TABLET | Freq: Four times a day (QID) | ORAL | Status: DC | PRN

## 2012-12-09 MED ORDER — LISINOPRIL 5 MG PO TABS: 5.0000 mg | ORAL_TABLET | Freq: Every day | ORAL | Status: DC

## 2012-12-09 MED ORDER — HYDROCODONE-ACETAMINOPHEN 5-325 MG PO TABS: 1.0000 | ORAL_TABLET | Freq: Four times a day (QID) | ORAL | Status: DC | PRN

## 2012-12-09 NOTE — Patient Instructions (Signed)
Fracture A fracture is a break in a bone, due to a force on the bone that is greater than the bone's strength can handle. There are many types of fractures, including:  Complete fracture: The break passes completely through the bone.  Displaced: The ends of the bone fragments are not properly aligned.  Non-displaced: The ends of the bone fragments are in proper alignment.  Incomplete fracture (greenstick): The break does not pass completely through the bone. Incomplete fractures may or may not be angular (angulated).  Open fracture (compound): Part of the broken bone pokes through the skin. Open fractures have a high risk for infection.  Closed fracture: The fracture has not broken through the skin.  Comminuted fracture: The bone is broken into more than two pieces.  Compression fracture: The break occurs from extreme pressure on the bone (includes crushing injury).  Impacted fracture: The broken bone ends have been driven into each other.  Avulsion fracture: A ligament or tendon pulls a small piece of bone off from the main bony segment.  Pathologic fracture: A fracture due to the bone being made weak by a disease (osteoporosis or tumors).  Stress fracture: A fracture caused by intense exercise or repetitive and prolonged pressure that makes the bone weak. SYMPTOMS   Pain, tenderness, bleeding, bruising, and swelling at the fracture site.  Weakness and inability to bear weight on the injured extremity.  Paleness and deformity (sometimes).  Loss of pulse, numbness, tingling, or paralysis below the fracture site (usually a limb); these are emergencies. CAUSES  Bone being subjected to a force greater than its strength. RISK INCREASES WITH:  Contact sports and falls from heights.  Previous or current bone problems (osteoporosis or tumors).  Poor balance.  Poor strength and flexibility. PREVENTION   Warm up and stretch properly before activity.  Maintain physical  fitness:  Cardiovascular fitness.  Muscle strength.  Flexibility and endurance.  Wear proper protective equipment.  Use proper exercise technique. RELATED COMPLICATIONS   Bone fails to heal (nonunion).  Bone heals in a poor position (malunion).  Low blood volume (hypovolemic), shock due to blood loss.  Clump of fat cells travels through the blood (fat embolus) from the injury site to the lungs or brain (more common with thigh fractures).  Obstruction of nearby arteries. TREATMENT  Treatment first requires realigning of the bones (reduction) by a medically trained person, if the fracture is displaced. After realignment if the fracture is completed, or for non-displaced fractures, ice and medicine are used to reduce pain and inflammation. The bone and adjacent joints are then restrained with a splint, cast, or brace to allow the bones to heal without moving. Surgery is sometimes needed, to reposition the bones and hold the position with rods, pins, plates, or screws. Restraint for long periods of time may result in muscle and joint weakness or build up of fluid in tissues (edema). For this reason, physical therapy is often needed to regain strength and full range of motion. Recovery is complete when there is no bone motion at the fracture site and x-rays (radiographs) show complete healing.  MEDICATION   General anesthesia, sedation, or muscle relaxants may be needed to allow for realignment of the fracture. If pain medicine is needed, nonsteroidal anti-inflammatory medicines (aspirin and ibuprofen), or other minor pain relievers (acetaminophen), are often advised.  Do not take pain medicine for 7 days before surgery.  Stronger pain relievers may be prescribed by your caregiver. Use only as directed and only as much   as you need. SEEK MEDICAL CARE IF:   The following occur after restraint or surgery. (Report any of these signs immediately):  Swelling above or below the fracture  site.  Severe, persistent pain.  Blue or gray skin below the fracture site, especially under the nails. Numbness or loss of feeling below the fracture site. Document Released: 04/21/2005 Document Revised: 04/07/2012 Document Reviewed: 08/03/2008 [employer] Patient Information 2014 [employer].  

## 2012-12-09 NOTE — Progress Notes (Signed)
  Subjective:    Patient ID: Ellen Woods, female    DOB: [birth year], 72 y.o.   MRN: [id number]  HPI This 72 y.o. female presents for evaluation of pain.  She fell at the [facility] and she fx her left femur and  Pelvis.  She has had ORIF of left femur fx and she has been discharged to home and is having home health PT coming to her house.  She is accompanied by her daughter.   Review of Systems No chest pain, SOB, HA, dizziness, vision change, N/V, diarrhea, constipation, dysuria, urinary urgency or frequency, myalgias, arthralgias or rash.     Objective:   Physical Exam Vital signs noted  Well developed well nourished female.  HEENT - Head atraumatic Normocephalic Respiratory - Lungs CTA bilateral Cardiac - RRR S1 and S2 without murmur GI - Abdomen soft Nontender and bowel sounds active x 4 Extremities - No edema. Neuro - Grossly intact.       Assessment & Plan:  RUQ pain - Plan: CANCELED: CT Abdomen Pelvis W Wo Contrast  Pelvic fracture, open, initial encounter - Plan: HYDROcodone-acetaminophen (NORCO/VICODIN) 5-325 MG per tablet  Femur fracture, left, sequela - Plan: HYDROcodone-acetaminophen (NORCO/VICODIN) 5-325 MG per tablet  Diabetes - Plan: lisinopril (PRINIVIL,ZESTRIL) 5 MG tablet  Essential hypertension, benign - Plan: lisinopril (PRINIVIL,ZESTRIL) 5 MG tablet  Back pain - Plan: baclofen (LIORESAL) 10 MG tablet, methocarbamol (ROBAXIN) 500 MG tablet

## 2012-12-10 ENCOUNTER — Other Ambulatory Visit: Payer: Self-pay | Admitting: *Deleted

## 2012-12-10 MED ORDER — FUROSEMIDE 20 MG PO TABS: 20.0000 mg | ORAL_TABLET | Freq: Every day | ORAL | Status: DC

## 2012-12-10 MED ORDER — METOPROLOL TARTRATE 25 MG PO TABS: 25.0000 mg | ORAL_TABLET | Freq: Two times a day (BID) | ORAL | Status: DC

## 2012-12-10 NOTE — Telephone Encounter (Signed)
Spoke with patient and she does want these to go to [facility]

## 2012-12-27 ENCOUNTER — Telehealth: Payer: Self-pay | Admitting: Family Medicine

## 2012-12-29 ENCOUNTER — Ambulatory Visit: Payer: Medicare PPO

## 2012-12-29 ENCOUNTER — Other Ambulatory Visit: Payer: Medicare PPO

## 2013-01-04 ENCOUNTER — Other Ambulatory Visit: Payer: Self-pay | Admitting: Pharmacist

## 2013-01-21 ENCOUNTER — Other Ambulatory Visit: Payer: Self-pay | Admitting: *Deleted

## 2013-01-21 DIAGNOSIS — M549 Dorsalgia, unspecified: Secondary | ICD-10-CM

## 2013-01-21 MED ORDER — METHOCARBAMOL 500 MG PO TABS: 500.0000 mg | ORAL_TABLET | Freq: Four times a day (QID) | ORAL | Status: DC | PRN

## 2013-01-21 NOTE — Telephone Encounter (Signed)
Patient last seen on 12-09-12. Rx last filled on 12-10-12. Please advise

## 2013-01-26 ENCOUNTER — Other Ambulatory Visit: Payer: Self-pay | Admitting: *Deleted

## 2013-01-26 DIAGNOSIS — F411 Generalized anxiety disorder: Secondary | ICD-10-CM

## 2013-01-26 NOTE — Telephone Encounter (Signed)
LAST OV 12/19/12. PLEASE PRINT FOR MAIL ORDER AND NOTIFY PT WHEN READY. THANKS.

## 2013-01-27 MED ORDER — ALPRAZOLAM 0.5 MG PO TABS: 0.5000 mg | ORAL_TABLET | Freq: Two times a day (BID) | ORAL | Status: DC

## 2013-01-27 NOTE — Telephone Encounter (Signed)
Pt notified rx at front desk

## 2013-03-01 ENCOUNTER — Other Ambulatory Visit: Payer: Self-pay | Admitting: Pharmacist

## 2013-03-08 ENCOUNTER — Other Ambulatory Visit: Payer: Self-pay | Admitting: Nurse Practitioner

## 2013-03-21 ENCOUNTER — Ambulatory Visit (INDEPENDENT_AMBULATORY_CARE_PROVIDER_SITE_OTHER): Payer: Medicare PPO | Admitting: Family Medicine

## 2013-03-21 ENCOUNTER — Encounter (INDEPENDENT_AMBULATORY_CARE_PROVIDER_SITE_OTHER): Payer: Self-pay

## 2013-03-21 ENCOUNTER — Encounter: Payer: Self-pay | Admitting: Family Medicine

## 2013-03-21 VITALS — BP 136/75 | HR 106 | Temp 97.8°F | Ht 59.0 in | Wt 116.2 lb

## 2013-03-21 DIAGNOSIS — M129 Arthropathy, unspecified: Secondary | ICD-10-CM

## 2013-03-21 DIAGNOSIS — M1990 Unspecified osteoarthritis, unspecified site: Secondary | ICD-10-CM

## 2013-03-21 DIAGNOSIS — E119 Type 2 diabetes mellitus without complications: Secondary | ICD-10-CM

## 2013-03-21 DIAGNOSIS — I25810 Atherosclerosis of coronary artery bypass graft(s) without angina pectoris: Secondary | ICD-10-CM

## 2013-03-21 DIAGNOSIS — E039 Hypothyroidism, unspecified: Secondary | ICD-10-CM

## 2013-03-21 DIAGNOSIS — M25559 Pain in unspecified hip: Secondary | ICD-10-CM

## 2013-03-21 DIAGNOSIS — M25552 Pain in left hip: Secondary | ICD-10-CM

## 2013-03-21 LAB — POCT CBC
Granulocyte percent: 68.2 %G (ref 37–80)
HCT, POC: 41.4 % (ref 37.7–47.9)
Hemoglobin: 13.2 g/dL (ref 12.2–16.2)
Lymph, poc: 1.4 (ref 0.6–3.4)
MCH, POC: 30.2 pg (ref 27–31.2)
MCHC: 32 g/dL (ref 31.8–35.4)
MCV: 94.5 fL (ref 80–97)
MPV: 7.7 fL (ref 0–99.8)
POC Granulocyte: 3.4 (ref 2–6.9)
POC LYMPH PERCENT: 27.7 %L (ref 10–50)
Platelet Count, POC: 222 10*3/uL (ref 142–424)
RBC: 4.4 M/uL (ref 4.04–5.48)
RDW, POC: 13.7 %
WBC: 5 10*3/uL (ref 4.6–10.2)

## 2013-03-21 LAB — POCT GLYCOSYLATED HEMOGLOBIN (HGB A1C): Hemoglobin A1C: 8

## 2013-03-21 NOTE — Progress Notes (Signed)
  Subjective:    Patient ID: Ellen Woods, female    DOB: Apr 12, 1941, 72 y.o.   MRN: [id number]  HPI This 72 y.o. female presents for evaluation of left hip pain and difficulty with ambulation. She states her left leg is shorter after having the ORIF surgery when she had the left Proximal femur fracture.  She states she has difficulty with ambulating and she believes It is because her left leg is shorter now.  She is c/o left leg and hip discomfort.  She states She was talking to her insurance company and they recommended she get a rx for shoes To help her with her leg being shorter.  She has hx of diabetes, CAD, hypothyroidism,  And hyperlipidemia.  She uses lantus insulin for he diabetes.  She has hx of CAD and Paroxysmal atrial fibrillation.   .   Review of Systems C/o left hip pain. No chest pain, SOB, HA, dizziness, vision change, N/V, diarrhea, constipation, dysuria, urinary urgency or frequency or rash.     Objective:   Physical Exam  Vital signs noted  Well developed well nourished female.  HEENT - Head atraumatic Normocephalic                Eyes - PERRLA, Conjuctiva - clear Sclera- Clear EOMI                Ears - EAC's Wnl TM's Wnl Gross Hearing WNL                Nose - Nares patent                 Throat - oropharanx wnl Respiratory - Lungs CTA bilateral Cardiac - RRR S1 and S2 without murmur GI - Abdomen soft Nontender and bowel sounds active x 4 Extremities - No edema. Neuro - Grossly intact.      Assessment & Plan:  Diabetes - Plan: POCT CBC, CMP14+EGFR, POCT glycosylated hemoglobin (Hb A1C), Lipid panel, Thyroid Panel With TSH, Vit D  25 hydroxy (rtn osteoporosis monitoring)  Unspecified hypothyroidism - Plan: POCT CBC, CMP14+EGFR, POCT glycosylated hemoglobin (Hb A1C), Lipid panel, Thyroid Panel With TSH, Vit D  25 hydroxy (rtn osteoporosis monitoring)  Arthritis - Plan: AMB referral to orthopedics  Left hip pain - Plan: AMB referral to orthopedics.   Form filled out for disabled parking sticker.  CAD - Discussed she make an appointment to see her cardilogist.  Deatra Canter FNP

## 2013-03-21 NOTE — Patient Instructions (Signed)

## 2013-03-22 LAB — CMP14+EGFR
ALT: 25 IU/L (ref 0–32)
AST: 29 IU/L (ref 0–40)
Albumin/Globulin Ratio: 2.4 (ref 1.1–2.5)
Albumin: 4.4 g/dL (ref 3.5–4.8)
Alkaline Phosphatase: 82 IU/L (ref 39–117)
BUN/Creatinine Ratio: 24 (ref 11–26)
BUN: 21 mg/dL (ref 8–27)
CO2: 27 mmol/L (ref 18–29)
Calcium: 9.4 mg/dL (ref 8.6–10.2)
Chloride: 100 mmol/L (ref 97–108)
Creatinine, Ser: 0.86 mg/dL (ref 0.57–1.00)
GFR calc Af Amer: 78 mL/min/{1.73_m2} (ref >59)
GFR calc non Af Amer: 68 mL/min/{1.73_m2} (ref >59)
Globulin, Total: 1.8 g/dL (ref 1.5–4.5)
Glucose: 176 mg/dL — ABNORMAL HIGH (ref 65–99)
Potassium: 4.4 mmol/L (ref 3.5–5.2)
Sodium: 142 mmol/L (ref 134–144)
Total Bilirubin: 0.6 mg/dL (ref 0.0–1.2)
Total Protein: 6.2 g/dL (ref 6.0–8.5)

## 2013-03-22 LAB — LIPID PANEL
Chol/HDL Ratio: 2.3 ratio units (ref 0.0–4.4)
Cholesterol, Total: 156 mg/dL (ref 100–199)
HDL: 67 mg/dL (ref >39)
LDL Calculated: 71 mg/dL (ref 0–99)
Triglycerides: 92 mg/dL (ref 0–149)
VLDL Cholesterol Cal: 18 mg/dL (ref 5–40)

## 2013-03-22 LAB — THYROID PANEL WITH TSH
Free Thyroxine Index: 2.4 (ref 1.2–4.9)
T3 Uptake Ratio: 30 % (ref 24–39)
T4, Total: 7.9 ug/dL (ref 4.5–12.0)
TSH: 1.68 u[IU]/mL (ref 0.450–4.500)

## 2013-03-22 LAB — VITAMIN D 25 HYDROXY (VIT D DEFICIENCY, FRACTURES): Vit D, 25-Hydroxy: 26 ng/mL — ABNORMAL LOW (ref 30.0–100.0)

## 2013-03-23 ENCOUNTER — Other Ambulatory Visit: Payer: Self-pay | Admitting: Family Medicine

## 2013-03-23 MED ORDER — VITAMIN D (ERGOCALCIFEROL) 1.25 MG (50000 UNIT) PO CAPS: 50000.0000 [IU] | ORAL_CAPSULE | ORAL | Status: DC

## 2013-04-06 ENCOUNTER — Telehealth: Payer: Self-pay | Admitting: *Deleted

## 2013-04-06 NOTE — Telephone Encounter (Signed)
Message copied by Baltazar Apo on Wed Apr 06, 2013  8:55 AM ------      Message from: Deatra Canter      Created: Wed Mar 23, 2013  9:32 AM       Anemia has improved, diabetes is uncontrolled and recommend seeing [name] or Marcelino Duster for diabetes visit.  Vit D is low and will send in a rx ------

## 2013-04-07 ENCOUNTER — Ambulatory Visit (INDEPENDENT_AMBULATORY_CARE_PROVIDER_SITE_OTHER): Payer: Medicare HMO | Admitting: Cardiovascular Disease

## 2013-04-07 ENCOUNTER — Encounter: Payer: Self-pay | Admitting: Cardiovascular Disease

## 2013-04-07 VITALS — BP 130/76 | HR 88 | Ht 59.0 in | Wt 115.0 lb

## 2013-04-07 DIAGNOSIS — I318 Other specified diseases of pericardium: Secondary | ICD-10-CM

## 2013-04-07 DIAGNOSIS — E119 Type 2 diabetes mellitus without complications: Secondary | ICD-10-CM

## 2013-04-07 DIAGNOSIS — Q250 Patent ductus arteriosus: Secondary | ICD-10-CM

## 2013-04-07 DIAGNOSIS — E785 Hyperlipidemia, unspecified: Secondary | ICD-10-CM

## 2013-04-07 DIAGNOSIS — I4891 Unspecified atrial fibrillation: Secondary | ICD-10-CM

## 2013-04-07 DIAGNOSIS — I2510 Atherosclerotic heart disease of native coronary artery without angina pectoris: Secondary | ICD-10-CM

## 2013-04-07 DIAGNOSIS — I480 Paroxysmal atrial fibrillation: Secondary | ICD-10-CM

## 2013-04-07 DIAGNOSIS — I10 Essential (primary) hypertension: Secondary | ICD-10-CM

## 2013-04-07 DIAGNOSIS — I779 Disorder of arteries and arterioles, unspecified: Secondary | ICD-10-CM

## 2013-04-07 NOTE — Progress Notes (Signed)
Patient ID: Ellen Woods, female   DOB: [birth year], 72 y.o.   MRN: [id number]      SUBJECTIVE: The patient is a 72 year old woman who I meeting for the first time. She reportedly has a history of paroxysmal atrial fibrillation, carotid artery disease (0-39% bilaterally in 03/2012), coronary artery disease, hypertension, patent ductus arteriosus, pericardial calcification, and diabetes mellitus. A Holter monitor in February 2013 did not reveal any evidence of atrial fibrillation. She underwent cardiac catheterization on 05/21/2010 by Dr. Clifton James which revealed a proximal calcified LAD stenosis of approximately 50% and a calcified long tubular mid LAD stenosis of 50-60%. She had normal left ventricular systolic function. She also had normal filling pressures. She denies any exertional chest pain, shortness of breath, palpitations, lightheadedness and dizziness. She says she feels her heart beating every night when she lies down to go to bed and this has been going on for several years.    Allergies  Allergen Reactions  . Azithromycin Other (See Comments)    Hospital reaction    Current Outpatient Prescriptions  Medication Sig Dispense Refill  . acetaminophen (TYLENOL) 325 MG tablet Take 2 tablets (650 mg total) by mouth every 6 (six) hours as needed.      Marland Kitchen alendronate (FOSAMAX) 70 MG tablet Take 70 mg by mouth every 7 (seven) days. Take with a full glass of water on an empty stomach.      . ALPRAZolam (XANAX) 0.5 MG tablet Take 1 tablet (0.5 mg total) by mouth 2 (two) times daily.  60 tablet  3  . aspirin 81 MG tablet Take 81 mg by mouth daily.      . B-D UF III MINI PEN NEEDLES 31G X 5 MM MISC USE WITH PENS UP TO FOUR TIMES DAILY  360 each  2  . CALCIUM-MAGNESIUM-ZINC PO Take 1 tablet by mouth daily.      . cetirizine (ZYRTEC) 10 MG tablet Take 1 tablet (10 mg total) by mouth daily.  30 tablet  5  . docusate sodium 100 MG CAPS Take 100 mg by mouth 2 (two) times daily.  10 capsule  0  .  ferrous sulfate 325 (65 FE) MG tablet Take 1 tablet (325 mg total) by mouth 2 (two) times daily with a meal.    3  . fluticasone (FLONASE) 50 MCG/ACT nasal spray       . furosemide (LASIX) 20 MG tablet Take 1 tablet (20 mg total) by mouth daily.  90 tablet  1  . HYDROcodone-acetaminophen (NORCO/VICODIN) 5-325 MG per tablet Take 1-2 tablets by mouth every 6 (six) hours as needed for pain.  50 tablet  1  . insulin glargine (LANTUS) 100 UNIT/ML injection Inject 0.15 mLs (15 Units total) into the skin every morning.  5 pen  3  . insulin regular (NOVOLIN R,HUMULIN R) 100 units/mL injection Sliding scale : CBG < 70: Drink juice; CBG 70 - 120: 0 units: CBG 121 - 150: 2 units; CBG 151 - 200: 3 units; CBG 201 - 250: 5 units; CBG 251 - 300: 8 units;CBG 301 - 350: 11 units; CBG 351 - 400: 15 units; CBG > 400 : 15 units and notify MD  10 mL  12  . levothyroxine (SYNTHROID, LEVOTHROID) 50 MCG tablet Take 1 tablet (50 mcg total) by mouth daily.  90 tablet  1  . lisinopril (PRINIVIL,ZESTRIL) 5 MG tablet TAKE 1 TABLET DAILY  90 tablet  0  . metoprolol tartrate (LOPRESSOR) 25 MG tablet Take 1 tablet (  25 mg total) by mouth 2 (two) times daily.  180 tablet  1  . Multiple Vitamin (MULTIVITAMIN) tablet Take 1 tablet by mouth daily.      Marland Kitchen NITROSTAT 0.4 MG SL tablet Place 1 tablet (0.4 mg total) under the tongue every 5 (five) minutes as needed for chest pain.  30 tablet  0  . potassium chloride SA (K-DUR,KLOR-CON) 20 MEQ tablet Take 20 mEq by mouth daily.       Marland Kitchen PRODIGY NO CODING BLOOD GLUC test strip       . riboflavin (VITAMIN B-2) 100 MG TABS tablet Take 100 mg by mouth daily.      . simvastatin (ZOCOR) 40 MG tablet TAKE 1 TABLET EVERY EVENING  90 tablet  0  . vitamin B-12 (CYANOCOBALAMIN) 1000 MCG tablet Take 1 tablet (1,000 mcg total) by mouth daily.  30 tablet  5  . vitamin C (ASCORBIC ACID) 500 MG tablet Take 500 mg by mouth daily.      . baclofen (LIORESAL) 10 MG tablet Take 10 mg by mouth 3 (three) times  daily.      . methocarbamol (ROBAXIN) 500 MG tablet Take 1 tablet (500 mg total) by mouth every 6 (six) hours as needed.  30 tablet  0   No current facility-administered medications for this visit.    Past Medical History  Diagnosis Date  . Other dyspnea and respiratory abnormality     Resolved  . Unspecified essential hypertension   . Type II or unspecified type diabetes mellitus without mention of complication, not stated as uncontrolled   . Coronary atherosclerosis of native coronary artery     50% LAD stenosis  . Encounter for long-term (current) use of insulin   . Other symptoms involving cardiovascular system   . Pain in joint, pelvic region and thigh   . Shortness of breath   . Other chest pain     Atypical chest pain  . PDA (patent ductus arteriosus)     Small PDA documented by CT angiography, no pulmonary hypertension  . Pericardial calcification     Ruled out for restrictive pericarditis  . Thyroid disease   . Arthritis   . Back pain 1/14    Tx by orthopedics  . Family history of anesthesia complication     Daughter has nausea  . COPD (chronic obstructive pulmonary disease)     Past Surgical History  Procedure Laterality Date  . Incisional hernia repair    . Abdominal hysterectomy    . Tubal ligation    . Intramedullary (im) nail intertrochanteric Left 11/13/2012    Procedure: INTRAMEDULLARY (IM) NAIL INTERTROCHANTRIC LEFT HIP;  Surgeon: Velna Ochs, MD;  Location: [facility] OR;  Service: Orthopedics;  Laterality: Left;    History   Social History  . Marital Status: Widowed    Spouse Name: N/A    Number of Children: 2  . Years of Education: N/A   Occupational History  . Not on file.   Social History Main Topics  . Smoking status: Never Smoker   . Smokeless tobacco: Never Used  . Alcohol Use: No  . Drug Use: No  . Sexual Activity: Not on file   Other Topics Concern  . Not on file   Social History Narrative  . No narrative on file     Filed  Vitals:   04/07/13 1016  BP: 130/76  Pulse: 88  Height: 4\' 11"  (1.499 m)  Weight: 115 lb (52.164 kg)  PHYSICAL EXAM General: NAD Neck: No JVD, no thyromegaly or thyroid nodule.  Lungs: Clear to auscultation bilaterally with normal respiratory effort. CV: Nondisplaced PMI.  Heart regular S1/S2, no S3/S4, no murmur.  No peripheral edema.  No carotid bruit.  Normal pedal pulses.  Abdomen: Soft, nontender, no hepatosplenomegaly, no distention.  Neurologic: Alert and oriented x 3.  Psych: Normal affect. Extremities: No clubbing or cyanosis.   ECG: reviewed and available in electronic records.      ASSESSMENT AND PLAN: 1. CAD: moderate LAD stenosis noted on coronary angiography in 05/2010. Symptomatically stable. I recommend she continue ASA and statin therapy. 2. Paroxysmal atrial fibrillation: She appears to be asymptomatic from the standpoint, and thus I feel there is no indication for Holter or event monitoring at this time. She remains on metoprolol. 3. HTN: controlled on lisinopril and metoprolol. 4. Hyperlipidemia: on simvastatin 40 mg daily. 5. Carotid artery stenosis: will monitor with surveillance Dopplers. 6. Pericardial calcification: normal pressures in 05/2010.   Prentice Docker, M.D., F.A.C.C.

## 2013-04-07 NOTE — Patient Instructions (Signed)

## 2013-04-11 NOTE — Telephone Encounter (Signed)
Pt notified of results Verbalizes understanding Will back to schedule appt with [facility]

## 2013-04-11 NOTE — Telephone Encounter (Signed)
Message copied by Bearl Mulberry on Mon Apr 11, 2013  4:39 PM ------      Message from: Deatra Canter      Created: Wed Mar 23, 2013  9:32 AM       Anemia has improved, diabetes is uncontrolled and recommend seeing [name] or Marcelino Duster for diabetes visit.  Vit D is low and will send in a rx ------

## 2013-04-29 ENCOUNTER — Other Ambulatory Visit: Payer: Self-pay | Admitting: Family Medicine

## 2013-06-13 ENCOUNTER — Other Ambulatory Visit: Payer: Self-pay | Admitting: Pharmacist

## 2013-06-13 ENCOUNTER — Other Ambulatory Visit: Payer: Self-pay | Admitting: Nurse Practitioner

## 2013-06-13 ENCOUNTER — Other Ambulatory Visit: Payer: Self-pay | Admitting: Family Medicine

## 2013-08-10 ENCOUNTER — Other Ambulatory Visit: Payer: Self-pay | Admitting: Family Medicine

## 2013-09-01 ENCOUNTER — Other Ambulatory Visit (INDEPENDENT_AMBULATORY_CARE_PROVIDER_SITE_OTHER): Payer: Medicare PPO

## 2013-09-01 DIAGNOSIS — R5383 Other fatigue: Secondary | ICD-10-CM

## 2013-09-01 DIAGNOSIS — E785 Hyperlipidemia, unspecified: Secondary | ICD-10-CM

## 2013-09-01 DIAGNOSIS — E039 Hypothyroidism, unspecified: Secondary | ICD-10-CM

## 2013-09-01 DIAGNOSIS — R5381 Other malaise: Secondary | ICD-10-CM

## 2013-09-01 DIAGNOSIS — E119 Type 2 diabetes mellitus without complications: Secondary | ICD-10-CM

## 2013-09-01 DIAGNOSIS — E559 Vitamin D deficiency, unspecified: Secondary | ICD-10-CM

## 2013-09-01 LAB — POCT GLYCOSYLATED HEMOGLOBIN (HGB A1C): Hemoglobin A1C: 7.4

## 2013-09-02 ENCOUNTER — Ambulatory Visit (INDEPENDENT_AMBULATORY_CARE_PROVIDER_SITE_OTHER): Payer: Medicare PPO | Admitting: Family Medicine

## 2013-09-02 VITALS — BP 137/53 | HR 80 | Temp 97.7°F | Ht 59.0 in | Wt 120.0 lb

## 2013-09-02 DIAGNOSIS — E119 Type 2 diabetes mellitus without complications: Secondary | ICD-10-CM

## 2013-09-02 DIAGNOSIS — E039 Hypothyroidism, unspecified: Secondary | ICD-10-CM

## 2013-09-02 DIAGNOSIS — Z2911 Encounter for prophylactic immunotherapy for respiratory syncytial virus (RSV): Secondary | ICD-10-CM

## 2013-09-02 DIAGNOSIS — H538 Other visual disturbances: Secondary | ICD-10-CM

## 2013-09-02 LAB — CBC WITH DIFFERENTIAL
BASOS ABS: 0 10*3/uL (ref 0.0–0.2)
Basos: 1 %
Eos: 2 %
Eosinophils Absolute: 0.1 10*3/uL (ref 0.0–0.4)
HCT: 37.1 % (ref 34.0–46.6)
Hemoglobin: 12.2 g/dL (ref 11.1–15.9)
Immature Grans (Abs): 0 10*3/uL (ref 0.0–0.1)
Immature Granulocytes: 0 %
LYMPHS ABS: 1.1 10*3/uL (ref 0.7–3.1)
Lymphs: 26 %
MCH: 31.8 pg (ref 26.6–33.0)
MCHC: 32.9 g/dL (ref 31.5–35.7)
MCV: 97 fL (ref 79–97)
MONOCYTES: 10 %
MONOS ABS: 0.4 10*3/uL (ref 0.1–0.9)
Neutrophils Absolute: 2.5 10*3/uL (ref 1.4–7.0)
Neutrophils Relative %: 61 %
Platelets: 249 10*3/uL (ref 150–379)
RBC: 3.84 x10E6/uL (ref 3.77–5.28)
RDW: 13.7 % (ref 12.3–15.4)
WBC: 4.2 10*3/uL (ref 3.4–10.8)

## 2013-09-02 LAB — LIPID PANEL
Chol/HDL Ratio: 2.2 ratio units (ref 0.0–4.4)
Cholesterol, Total: 159 mg/dL (ref 100–199)
HDL: 72 mg/dL (ref >39)
LDL Calculated: 71 mg/dL (ref 0–99)
Triglycerides: 78 mg/dL (ref 0–149)
VLDL Cholesterol Cal: 16 mg/dL (ref 5–40)

## 2013-09-02 LAB — BMP8+EGFR
BUN/Creatinine Ratio: 27 — ABNORMAL HIGH (ref 11–26)
BUN: 20 mg/dL (ref 8–27)
CO2: 24 mmol/L (ref 18–29)
Calcium: 9.4 mg/dL (ref 8.7–10.3)
Chloride: 103 mmol/L (ref 97–108)
Creatinine, Ser: 0.73 mg/dL (ref 0.57–1.00)
GFR calc Af Amer: 95 mL/min/{1.73_m2} (ref >59)
GFR calc non Af Amer: 83 mL/min/{1.73_m2} (ref >59)
Glucose: 184 mg/dL — ABNORMAL HIGH (ref 65–99)
Potassium: 4.6 mmol/L (ref 3.5–5.2)
Sodium: 143 mmol/L (ref 134–144)

## 2013-09-02 LAB — VITAMIN D 25 HYDROXY (VIT D DEFICIENCY, FRACTURES): Vit D, 25-Hydroxy: 27.8 ng/mL — ABNORMAL LOW (ref 30.0–100.0)

## 2013-09-02 LAB — HEPATIC FUNCTION PANEL
ALT: 30 IU/L (ref 0–32)
AST: 28 IU/L (ref 0–40)
Albumin: 4.1 g/dL (ref 3.5–4.8)
Alkaline Phosphatase: 87 IU/L (ref 39–117)
Bilirubin, Direct: 0.24 mg/dL (ref 0.00–0.40)
Total Bilirubin: 0.8 mg/dL (ref 0.0–1.2)
Total Protein: 6 g/dL (ref 6.0–8.5)

## 2013-09-02 LAB — THYROID PANEL WITH TSH
Free Thyroxine Index: 3 (ref 1.2–4.9)
T3 Uptake Ratio: 34 % (ref 24–39)
T4, Total: 8.7 ug/dL (ref 4.5–12.0)
TSH: 0.043 u[IU]/mL — ABNORMAL LOW (ref 0.450–4.500)

## 2013-09-02 MED ORDER — LEVOTHYROXINE SODIUM 25 MCG PO TABS: 25.0000 ug | ORAL_TABLET | Freq: Every day | ORAL | Status: DC

## 2013-09-02 MED ORDER — PRODIGY SAFETY LANCETS 26G MISC: 4.0000 | Freq: Four times a day (QID) | Status: DC

## 2013-09-02 NOTE — Progress Notes (Signed)
Subjective:    Patient ID: Ellen Woods, female    DOB: 1940-07-25, 73 y.o.   MRN: [id number]  HPI This 73 y.o. female presents for evaluation of routine follow up.  She has hx of CAD, atrial fib, DM Hyperlipidemia and hypothyroidism.  She has had recent labs and her hgbaic looks better at 7.4% and her TSH is lows.  She is seeing Cardiology.  She denies any acute medical concerns   Review of Systems No chest pain, SOB, HA, dizziness, vision change, N/V, diarrhea, constipation, dysuria, urinary urgency or frequency, myalgias, arthralgias or rash.     Objective:   Physical Exam  Vital signs noted  Well developed well nourished female.  HEENT - Head atraumatic Normocephalic                Eyes - PERRLA, Conjuctiva - clear Sclera- Clear EOMI                Ears - EAC's Wnl TM's Wnl Gross Hearing WNL                Nose - Nares patent                 Throat - oropharanx wnl Respiratory - Lungs CTA bilateral Cardiac - RRR S1 and S2 without murmur GI - Abdomen soft Nontender and bowel sounds active x 4 Extremities - No edema. Neuro - Grossly intact.  Results for orders placed in visit on 09/01/13  [facility]      Result Value Ref Range   Glucose 184 (*) 65 - 99 mg/dL   BUN 20  8 - 27 mg/dL   Creatinine, Ser 0.73  0.57 - 1.00 mg/dL   GFR calc non Af Amer 83  >59 mL/min/1.73   GFR calc Af Amer 95  >59 mL/min/1.73   BUN/Creatinine Ratio 27 (*) 11 - 26   Sodium 143  134 - 144 mmol/L   Potassium 4.6  3.5 - 5.2 mmol/L   Chloride 103  97 - 108 mmol/L   CO2 24  18 - 29 mmol/L   Calcium 9.4  8.7 - 10.3 mg/dL  HEPATIC FUNCTION PANEL      Result Value Ref Range   Total Protein 6.0  6.0 - 8.5 g/dL   Albumin 4.1  3.5 - 4.8 g/dL   Total Bilirubin 0.8  0.0 - 1.2 mg/dL   Bilirubin, Direct 0.24  0.00 - 0.40 mg/dL   Alkaline Phosphatase 87  39 - 117 IU/L   AST 28  0 - 40 IU/L   ALT 30  0 - 32 IU/L  THYROID PANEL WITH TSH      Result Value Ref Range   TSH 0.043 (*) 0.450 - 4.500  uIU/mL   T4, Total 8.7  4.5 - 12.0 ug/dL   T3 Uptake Ratio 34  24 - 39 %   Free Thyroxine Index 3.0  1.2 - 4.9  LIPID PANEL      Result Value Ref Range   Cholesterol, Total 159  100 - 199 mg/dL   Triglycerides 78  0 - 149 mg/dL   HDL 72  >39 mg/dL   VLDL Cholesterol Cal 16  5 - 40 mg/dL   LDL Calculated 71  0 - 99 mg/dL   Chol/HDL Ratio 2.2  0.0 - 4.4 ratio units  VITAMIN D 25 HYDROXY      Result Value Ref Range   Vit D, 25-Hydroxy 27.8 (*) 30.0 - 100.0 ng/mL  CBC WITH DIFFERENTIAL/PLATELET      Result Value Ref Range   WBC 4.2  3.4 - 10.8 x10E3/uL   RBC 3.84  3.77 - 5.28 x10E6/uL   Hemoglobin 12.2  11.1 - 15.9 g/dL   HCT 37.1  34.0 - 46.6 %   MCV 97  79 - 97 fL   MCH 31.8  26.6 - 33.0 pg   MCHC 32.9  31.5 - 35.7 g/dL   RDW 13.7  12.3 - 15.4 %   Platelets 249  150 - 379 x10E3/uL   Neutrophils Relative % 61     Lymphs 26     Monocytes 10     Eos 2     Basos 1     Neutrophils Absolute 2.5  1.4 - 7.0 x10E3/uL   Lymphocytes Absolute 1.1  0.7 - 3.1 x10E3/uL   Monocytes Absolute 0.4  0.1 - 0.9 x10E3/uL   Eosinophils Absolute 0.1  0.0 - 0.4 x10E3/uL   Basophils Absolute 0.0  0.0 - 0.2 x10E3/uL   Immature Granulocytes 0     Immature Grans (Abs) 0.0  0.0 - 0.1 x10E3/uL  POCT GLYCOSYLATED HEMOGLOBIN (HGB A1C)      Result Value Ref Range   Hemoglobin A1C 7.4%        Assessment & Plan:  Diabetes - Plan: PRODIGY SAFETY LANCETS 26G MISC  Unspecified hypothyroidism - Plan: levothyroxine (LEVOTHROID) 25 MCG tablet  Blurred vision - Plan: Ambulatory referral to Ophthalmology  Ellen Penner FNP

## 2013-09-06 ENCOUNTER — Other Ambulatory Visit: Payer: Self-pay | Admitting: Family Medicine

## 2013-09-09 ENCOUNTER — Other Ambulatory Visit: Payer: Self-pay | Admitting: Family Medicine

## 2013-09-27 ENCOUNTER — Ambulatory Visit (INDEPENDENT_AMBULATORY_CARE_PROVIDER_SITE_OTHER): Payer: Medicare PPO | Admitting: Family Medicine

## 2013-09-27 ENCOUNTER — Encounter: Payer: Self-pay | Admitting: Family Medicine

## 2013-09-27 VITALS — BP 127/67 | HR 69 | Temp 98.6°F | Ht 59.0 in | Wt 120.0 lb

## 2013-09-27 DIAGNOSIS — T148 Other injury of unspecified body region: Secondary | ICD-10-CM

## 2013-09-27 DIAGNOSIS — W57XXXA Bitten or stung by nonvenomous insect and other nonvenomous arthropods, initial encounter: Secondary | ICD-10-CM

## 2013-09-27 MED ORDER — DOXYCYCLINE HYCLATE 100 MG PO TABS: 100.0000 mg | ORAL_TABLET | Freq: Two times a day (BID) | ORAL | Status: DC

## 2013-09-27 NOTE — Progress Notes (Signed)
   Subjective:    Patient ID: [name], female    DOB: December 04, 1940, 73 y.o.   MRN: [id number]  HPI This 73 y.o. female presents for evaluation of area on her right thigh with heat and redness.  She is not Sure but thinks she was bitten by a spider or insect.   Review of Systems No chest pain, SOB, HA, dizziness, vision change, N/V, diarrhea, constipation, dysuria, urinary urgency or frequency, myalgias, arthralgias or rash.     Objective:   Physical Exam Vital signs noted  Well developed well nourished female.  HEENT - Head atraumatic Normocephalic Respiratory - Lungs CTA bilateral Cardiac - RRR S1 and S2 without murmur Skin - Anular shaped erythematous region with central clearing approx 6 cm diameter right inner thigh       Assessment & Plan:  Insect bite - Plan: doxycycline (VIBRA-TABS) 100 MG tablet, DISCONTINUED: doxycycline (VIBRA-TABS) 100 MG tablet  Deatra Canter FNP

## 2013-10-06 ENCOUNTER — Encounter: Payer: Self-pay | Admitting: Family Medicine

## 2013-10-06 ENCOUNTER — Ambulatory Visit (INDEPENDENT_AMBULATORY_CARE_PROVIDER_SITE_OTHER): Payer: Medicare PPO | Admitting: Family Medicine

## 2013-10-06 VITALS — BP 122/56 | HR 74 | Temp 99.3°F | Ht 59.0 in | Wt 120.0 lb

## 2013-10-06 DIAGNOSIS — J309 Allergic rhinitis, unspecified: Secondary | ICD-10-CM

## 2013-10-06 MED ORDER — CETIRIZINE HCL 10 MG PO TABS: 10.0000 mg | ORAL_TABLET | Freq: Every day | ORAL | Status: DC

## 2013-10-06 MED ORDER — POTASSIUM CHLORIDE CRYS ER 20 MEQ PO TBCR: 20.0000 meq | EXTENDED_RELEASE_TABLET | Freq: Every day | ORAL | Status: DC

## 2013-10-06 MED ORDER — FUROSEMIDE 20 MG PO TABS: 20.0000 mg | ORAL_TABLET | Freq: Every day | ORAL | Status: DC

## 2013-10-06 NOTE — Progress Notes (Signed)
   Subjective:    Patient ID: Ellen Woods, female    DOB: 19-Nov-1940, 73 y.o.   MRN: [id number]  HPI This 73 y.o. female presents for evaluation of follow up on right leg tick bite and cellulitis.   Review of Systems No chest pain, SOB, HA, dizziness, vision change, N/V, diarrhea, constipation, dysuria, urinary urgency or frequency, myalgias, arthralgias or rash.     Objective:   Physical Exam Vital signs noted  Well developed well nourished female.  HEENT - Head atraumatic Normocephalic Respiratory - Lungs CTA bilateral Cardiac - RRR S1 and S2 without murmur Skin - Right thigh with area of healed dermatitis/ cellulitis and cellulitis has resolved       Assessment & Plan:  Allergic rhinitis - Plan: cetirizine (ZYRTEC) 10 MG tablet  Cellulitis - Resolved  Yeast infection - diflucan 150mg  one po qd x 2 days  Deatra Canter FNP

## 2013-10-07 ENCOUNTER — Other Ambulatory Visit: Payer: Self-pay | Admitting: Family Medicine

## 2013-10-07 ENCOUNTER — Other Ambulatory Visit: Payer: Self-pay | Admitting: Nurse Practitioner

## 2013-10-10 ENCOUNTER — Other Ambulatory Visit: Payer: Self-pay | Admitting: *Deleted

## 2013-10-10 MED ORDER — METOPROLOL TARTRATE 25 MG PO TABS: ORAL_TABLET | ORAL | Status: DC

## 2013-10-10 NOTE — Telephone Encounter (Signed)
Patient last seen in office on 10-06-13. Rx last filled on 09-27-13 for #20. Please advise on refill

## 2013-10-11 ENCOUNTER — Other Ambulatory Visit: Payer: Self-pay | Admitting: *Deleted

## 2013-10-11 MED ORDER — FUROSEMIDE 20 MG PO TABS: 20.0000 mg | ORAL_TABLET | Freq: Every day | ORAL | Status: DC

## 2013-10-11 NOTE — Telephone Encounter (Signed)
Pt does want from [facility] 10/11/13

## 2013-10-12 ENCOUNTER — Other Ambulatory Visit: Payer: Self-pay | Admitting: *Deleted

## 2013-10-12 MED ORDER — SIMVASTATIN 40 MG PO TABS
ORAL_TABLET | ORAL | Status: DC
Start: 2013-10-12 — End: 2014-03-13

## 2013-10-20 ENCOUNTER — Ambulatory Visit (INDEPENDENT_AMBULATORY_CARE_PROVIDER_SITE_OTHER): Payer: Medicare PPO | Admitting: Physician Assistant

## 2013-10-20 ENCOUNTER — Encounter: Payer: Self-pay | Admitting: Physician Assistant

## 2013-10-20 VITALS — BP 143/78 | HR 70 | Temp 98.1°F | Ht 59.0 in | Wt 116.8 lb

## 2013-10-20 DIAGNOSIS — J069 Acute upper respiratory infection, unspecified: Secondary | ICD-10-CM

## 2013-10-20 MED ORDER — AMOXICILLIN-POT CLAVULANATE 875-125 MG PO TABS: 1.0000 | ORAL_TABLET | Freq: Two times a day (BID) | ORAL | Status: DC

## 2013-10-20 NOTE — Patient Instructions (Signed)
Upper Respiratory Infection, Adult An upper respiratory infection (URI) is also known as the common cold. It is often caused by a type of germ (virus). Colds are easily spread (contagious). You can pass it to others by kissing, coughing, sneezing, or drinking out of the same glass. Usually, you get better in 1 or 2 weeks.  HOME CARE   Only take medicine as told by your doctor.  Use a warm mist humidifier or breathe in steam from a hot shower.  Drink enough water and fluids to keep your pee (urine) clear or pale yellow.  Get plenty of rest.  Return to work when your temperature is back to normal or as told by your doctor. You may use a face mask and wash your hands to stop your cold from spreading. GET HELP RIGHT AWAY IF:   After the first few days, you feel you are getting worse.  You have questions about your medicine.  You have chills, shortness of breath, or brown or red spit (mucus).  You have yellow or brown snot (nasal discharge) or pain in the face, especially when you bend forward.  You have a fever, puffy (swollen) neck, pain when you swallow, or white spots in the back of your throat.  You have a bad headache, ear pain, sinus pain, or chest pain.  You have a high-pitched whistling sound when you breathe in and out (wheezing).  You have a lasting cough or cough up blood.  You have sore muscles or a stiff neck. MAKE SURE YOU:   Understand these instructions.  Will watch your condition.  Will get help right away if you are not doing well or get worse. Document Released: 10/08/2007 Document Revised: 07/14/2011 Document Reviewed: 08/26/2010 [employer] Patient Information 2015 [employer]. This information is not intended to replace advice given to you by your health care provider. Make sure you discuss any questions you have with your health care provider.  

## 2013-10-20 NOTE — Progress Notes (Signed)
Subjective:     Patient ID: Ellen Woods, female   DOB: 04/16/1941, 73 y.o.   MRN: [id number]  HPI Pt here today with cough and congestion for 4-5 days Using OTC meds w/o relief of sx  Review of Systems  Constitutional: Positive for fever, appetite change and fatigue.  HENT: Positive for postnasal drip, sinus pressure and sore throat. Negative for rhinorrhea and sneezing.   Eyes: Negative.   Respiratory: Positive for cough and wheezing. Negative for choking and shortness of breath.   Cardiovascular: Negative for chest pain.       Objective:   Physical Exam  Constitutional: She appears well-developed and well-nourished.  HENT:  Right Ear: External ear normal.  Left Ear: External ear normal.  Mouth/Throat: No oropharyngeal exudate.  Neck: Neck supple. No thyromegaly present.  Cardiovascular: Normal rate, regular rhythm and normal heart sounds.   Pulmonary/Chest: Effort normal. She has no wheezes.  Very tight with cough  Lymphadenopathy:    She has no cervical adenopathy.       Assessment:     Acute URI    Plan:     Fluids Rest Cont OTC meds for sx Augmentin rx F/U prn

## 2013-10-21 ENCOUNTER — Other Ambulatory Visit: Payer: Self-pay | Admitting: Family Medicine

## 2013-10-25 ENCOUNTER — Other Ambulatory Visit: Payer: Self-pay | Admitting: *Deleted

## 2013-10-25 ENCOUNTER — Other Ambulatory Visit: Payer: Self-pay | Admitting: Family Medicine

## 2013-10-25 MED ORDER — POTASSIUM CHLORIDE CRYS ER 20 MEQ PO TBCR: 20.0000 meq | EXTENDED_RELEASE_TABLET | Freq: Every day | ORAL | Status: DC

## 2013-11-14 ENCOUNTER — Ambulatory Visit (INDEPENDENT_AMBULATORY_CARE_PROVIDER_SITE_OTHER): Payer: Medicare PPO | Admitting: Family Medicine

## 2013-11-14 ENCOUNTER — Encounter: Payer: Self-pay | Admitting: Family Medicine

## 2013-11-14 VITALS — BP 162/61 | HR 80 | Temp 98.7°F | Ht 59.0 in | Wt 118.0 lb

## 2013-11-14 DIAGNOSIS — R350 Frequency of micturition: Secondary | ICD-10-CM

## 2013-11-14 DIAGNOSIS — N3000 Acute cystitis without hematuria: Secondary | ICD-10-CM

## 2013-11-14 LAB — POCT UA - MICROSCOPIC ONLY
Casts, Ur, LPF, POC: NEGATIVE
Crystals, Ur, HPF, POC: NEGATIVE
Mucus, UA: NEGATIVE
Yeast, UA: NEGATIVE

## 2013-11-14 LAB — POCT URINALYSIS DIPSTICK
Bilirubin, UA: NEGATIVE
Ketones, UA: NEGATIVE
Nitrite, UA: POSITIVE
Spec Grav, UA: 1.015
Urobilinogen, UA: NEGATIVE
pH, UA: 5

## 2013-11-14 MED ORDER — CIPROFLOXACIN HCL 500 MG PO TABS: 500.0000 mg | ORAL_TABLET | Freq: Two times a day (BID) | ORAL | Status: DC

## 2013-11-14 NOTE — Progress Notes (Signed)
   Subjective:    Patient ID: Ellen Woods, female    DOB: 03/06/1941, 73 y.o.   MRN: [id number]  HPI  C/o urinary frequency and uti sx's for over 2 days.  She is having some pressure when voiding.  Review of Systems C/o dysuria   No chest pain, SOB, HA, dizziness, vision change, N/V, diarrhea, constipation,  myalgias, arthralgias or rash.  Objective:   Physical Exam Vital signs noted  Well developed well nourished female.  HEENT - Head atraumatic Normocephalic Respiratory - Lungs CTA bilateral Cardiac - RRR S1 and S2 without murmur GI - Abdomen soft Nontender and bowel sounds active x 4  Results for orders placed in visit on 11/14/13  POCT URINALYSIS DIPSTICK      Result Value Ref Range   Color, UA gold     Clarity, UA cloudy     Glucose, UA 4+     Bilirubin, UA negative     Ketones, UA negative     Spec Grav, UA 1.015     Blood, UA large     pH, UA 5.0     Protein, UA 4+     Urobilinogen, UA negative     Nitrite, UA positive     Leukocytes, UA large (3+)    POCT UA - MICROSCOPIC ONLY      Result Value Ref Range   WBC, Ur, HPF, POC tntc     RBC, urine, microscopic tntc     Bacteria, U Microscopic many     Mucus, UA negative     Epithelial cells, urine per micros few     Crystals, Ur, HPF, POC negative     Casts, Ur, LPF, POC negative     Yeast, UA negative         Assessment & Plan:  Urinary frequency - Plan: POCT urinalysis dipstick, POCT UA - Microscopic Only, Urine culture, ciprofloxacin (CIPRO) 500 MG tablet, Urine culture  Acute cystitis without hematuria - Plan: ciprofloxacin (CIPRO) 500 MG tablet, Urine culture  Push po fluids, rest, tylenol and motrin otc prn as directed for fever, arthralgias, and myalgias.  Follow up prn if sx's continue or persist.  Deatra CanterWilliam J Oxford FNP

## 2013-11-15 ENCOUNTER — Telehealth: Payer: Self-pay | Admitting: Family Medicine

## 2013-11-15 DIAGNOSIS — N3000 Acute cystitis without hematuria: Secondary | ICD-10-CM

## 2013-11-15 DIAGNOSIS — R350 Frequency of micturition: Secondary | ICD-10-CM

## 2013-11-15 MED ORDER — FLUCONAZOLE 150 MG PO TABS: ORAL_TABLET | ORAL | Status: DC

## 2013-11-15 MED ORDER — CIPROFLOXACIN HCL 500 MG PO TABS: 500.0000 mg | ORAL_TABLET | Freq: Two times a day (BID) | ORAL | Status: DC

## 2013-11-15 NOTE — Telephone Encounter (Signed)
Med [facility] to [facility]

## 2013-11-17 LAB — URINE CULTURE

## 2014-01-11 ENCOUNTER — Other Ambulatory Visit: Payer: Self-pay | Admitting: *Deleted

## 2014-01-11 DIAGNOSIS — F411 Generalized anxiety disorder: Secondary | ICD-10-CM

## 2014-01-11 MED ORDER — ALPRAZOLAM 0.5 MG PO TABS: 0.5000 mg | ORAL_TABLET | Freq: Two times a day (BID) | ORAL | Status: DC

## 2014-01-11 NOTE — Telephone Encounter (Signed)
Last ov 11/14/13. Please print for mail order.

## 2014-01-12 ENCOUNTER — Encounter: Payer: Self-pay | Admitting: Cardiovascular Disease

## 2014-01-12 ENCOUNTER — Ambulatory Visit (INDEPENDENT_AMBULATORY_CARE_PROVIDER_SITE_OTHER): Payer: Medicare HMO | Admitting: Cardiovascular Disease

## 2014-01-12 VITALS — BP 117/74 | HR 78 | Ht 59.0 in | Wt 116.0 lb

## 2014-01-12 DIAGNOSIS — I4891 Unspecified atrial fibrillation: Secondary | ICD-10-CM

## 2014-01-12 DIAGNOSIS — E119 Type 2 diabetes mellitus without complications: Secondary | ICD-10-CM

## 2014-01-12 DIAGNOSIS — E785 Hyperlipidemia, unspecified: Secondary | ICD-10-CM

## 2014-01-12 DIAGNOSIS — I739 Peripheral vascular disease, unspecified: Secondary | ICD-10-CM

## 2014-01-12 DIAGNOSIS — I318 Other specified diseases of pericardium: Secondary | ICD-10-CM

## 2014-01-12 DIAGNOSIS — I10 Essential (primary) hypertension: Secondary | ICD-10-CM

## 2014-01-12 DIAGNOSIS — I480 Paroxysmal atrial fibrillation: Secondary | ICD-10-CM

## 2014-01-12 DIAGNOSIS — I2510 Atherosclerotic heart disease of native coronary artery without angina pectoris: Secondary | ICD-10-CM

## 2014-01-12 DIAGNOSIS — I779 Disorder of arteries and arterioles, unspecified: Secondary | ICD-10-CM

## 2014-01-12 NOTE — Patient Instructions (Signed)
Continue all current medications. Your physician wants you to follow up in:  1 year.  You will receive a reminder letter in the mail one-two months in advance.  If you don't receive a letter, please call our office to schedule the follow up appointment   

## 2014-01-12 NOTE — Progress Notes (Signed)
Patient ID: Ellen Woods, female   DOB: Oct 18, 1940, 73 y.o.   MRN: [id number]      SUBJECTIVE: The patient presents for routine cardiovascular followup. She reportedly has a history of paroxysmal atrial fibrillation, carotid artery disease (0-39% bilaterally in 03/2012), coronary artery disease, hypertension, patent ductus arteriosus, pericardial calcification, and diabetes mellitus. A Holter monitor in February 2013 did not reveal any evidence of atrial fibrillation.  She underwent cardiac catheterization on 05/21/2010 by Dr. Clifton James which revealed a proximal calcified LAD stenosis of approximately 50% and a calcified long tubular mid LAD stenosis of 50-60%. She had normal left ventricular systolic function. She also had normal filling pressures. The patient denies any symptoms of chest pain, palpitations, shortness of breath, lightheadedness, dizziness, leg swelling, orthopnea, PND, and syncope. She has complaints about her glucometer.  ECG performed in the office today demonstrates normal sinus rhythm with a nonspecific T wave abnormality seen diffusely but primarily in the inferior leads.  Review of Systems: As per "subjective", otherwise negative.  Allergies  Allergen Reactions  . Azithromycin Other (See Comments)    Hospital reaction    Current Outpatient Prescriptions  Medication Sig Dispense Refill  . acetaminophen (TYLENOL) 325 MG tablet Take 2 tablets (650 mg total) by mouth every 6 (six) hours as needed.      . Alcohol Swabs (B-D SINGLE USE SWABS REGULAR) PADS USE  6  TIMES  DAILY  600 each  2  . alendronate (FOSAMAX) 70 MG tablet TAKE 1 TABLET EVERY WEEK  12 tablet  2  . ALPRAZolam (XANAX) 0.5 MG tablet Take 1 tablet (0.5 mg total) by mouth 2 (two) times daily.  180 tablet  0  . aspirin 81 MG tablet Take 81 mg by mouth daily.      . B-D UF III MINI PEN NEEDLES 31G X 5 MM MISC USE WITH PENS UP TO FOUR TIMES DAILY  360 each  2  . CALCIUM-MAGNESIUM-ZINC PO Take 1 tablet by  mouth daily.      . cetirizine (ZYRTEC) 10 MG tablet Take 1 tablet (10 mg total) by mouth daily.  90 tablet  4  . Docusate Sodium (DSS) 100 MG CAPS Take 100 mg by mouth as needed.      . ferrous sulfate 325 (65 FE) MG tablet Take 1 tablet (325 mg total) by mouth 2 (two) times daily with a meal.    3  . fexofenadine-pseudoephedrine (ALLEGRA-D 24) 180-240 MG per 24 hr tablet 1 tablet daily.       . fluticasone (FLONASE) 50 MCG/ACT nasal spray 1 spray as needed.       . furosemide (LASIX) 20 MG tablet Take 1 tablet (20 mg total) by mouth daily.  90 tablet  1  . insulin glargine (LANTUS) 100 UNIT/ML injection Inject 0.15 mLs (15 Units total) into the skin every morning.  5 pen  3  . insulin regular (NOVOLIN R,HUMULIN R) 100 units/mL injection Sliding scale : CBG < 70: Drink juice; CBG 70 - 120: 0 units: CBG 121 - 150: 2 units; CBG 151 - 200: 3 units; CBG 201 - 250: 5 units; CBG 251 - 300: 8 units;CBG 301 - 350: 11 units; CBG 351 - 400: 15 units; CBG > 400 : 15 units and notify MD  10 mL  12  . lisinopril (PRINIVIL,ZESTRIL) 5 MG tablet TAKE 1 TABLET EVERY DAY  90 tablet  1  . metoprolol tartrate (LOPRESSOR) 25 MG tablet TAKE 1 TABLET TWICE DAILY  180  tablet  0  . Multiple Vitamin (MULTIVITAMIN) tablet Take 1 tablet by mouth daily.      Marland Kitchen NITROSTAT 0.4 MG SL tablet Place 1 tablet (0.4 mg total) under the tongue every 5 (five) minutes as needed for chest pain.  30 tablet  0  . potassium chloride SA (K-DUR,KLOR-CON) 20 MEQ tablet Take 1 tablet (20 mEq total) by mouth daily.  90 tablet  1  . PRODIGY NO CODING BLOOD GLUC test strip       . PRODIGY SAFETY LANCETS 26G MISC 4 Devices by Does not apply route 4 (four) times daily.  100 each  11  . riboflavin (VITAMIN B-2) 100 MG TABS tablet Take 100 mg by mouth daily.      . simvastatin (ZOCOR) 40 MG tablet TAKE 1 TABLET EVERY EVENING  90 tablet  0  . vitamin B-12 (CYANOCOBALAMIN) 1000 MCG tablet Take 1 tablet (1,000 mcg total) by mouth daily.  30 tablet  5  .  vitamin C (ASCORBIC ACID) 500 MG tablet Take 500 mg by mouth daily.      Marland Kitchen ZOSTAVAX 16109 UNT/0.65ML injection        No current facility-administered medications for this visit.    Past Medical History  Diagnosis Date  . Other dyspnea and respiratory abnormality     Resolved  . Unspecified essential hypertension   . Type II or unspecified type diabetes mellitus without mention of complication, not stated as uncontrolled   . Coronary atherosclerosis of native coronary artery     50% LAD stenosis  . Encounter for long-term (current) use of insulin   . Other symptoms involving cardiovascular system   . Pain in joint, pelvic region and thigh   . Shortness of breath   . Other chest pain     Atypical chest pain  . PDA (patent ductus arteriosus)     Small PDA documented by CT angiography, no pulmonary hypertension  . Pericardial calcification     Ruled out for restrictive pericarditis  . Thyroid disease   . Arthritis   . Back pain 1/14    Tx by orthopedics  . Family history of anesthesia complication     Daughter has nausea  . COPD (chronic obstructive pulmonary disease)     Past Surgical History  Procedure Laterality Date  . Incisional hernia repair    . Abdominal hysterectomy    . Tubal ligation    . Intramedullary (im) nail intertrochanteric Left 11/13/2012    Procedure: INTRAMEDULLARY (IM) NAIL INTERTROCHANTRIC LEFT HIP;  Surgeon: Velna Ochs, MD;  Location: [facility] OR;  Service: Orthopedics;  Laterality: Left;    History   Social History  . Marital Status: Widowed    Spouse Name: N/A    Number of Children: 2  . Years of Education: N/A   Occupational History  . Not on file.   Social History Main Topics  . Smoking status: Never Smoker   . Smokeless tobacco: Never Used  . Alcohol Use: Yes     Comment: Ocasionally/Rarely  . Drug Use: No  . Sexual Activity: Not on file   Other Topics Concern  . Not on file   Social History Narrative  . No narrative on file      Filed Vitals:   01/12/14 1413  BP: 117/74  Pulse: 78  Height:  (1.499 m)  Weight: 116 lb (52.617 kg)  SpO2: 98%    PHYSICAL EXAM General: NAD HEENT: Normal. Neck: No JVD, no thyromegaly.  Lungs: Clear to auscultation bilaterally with normal respiratory effort. CV: Nondisplaced PMI.  Regular rate and rhythm, normal S1/S2, no S3/S4, no murmur. No pretibial or periankle edema.  Faint bilateral carotid bruits.  Normal pedal pulses.  Abdomen: Soft, nontender, no hepatosplenomegaly, no distention.  Neurologic: Alert and oriented x 3.  Psych: Normal affect. Skin: Normal. Musculoskeletal: Normal range of motion, no gross deformities. Extremities: No clubbing or cyanosis.   ECG: Most recent ECG reviewed.      ASSESSMENT AND PLAN: 1. CAD: Moderate LAD stenosis noted on coronary angiography in 05/2010. Stable ischemic heart disease. I recommend she continue ASA and statin therapy.  2. Paroxysmal atrial fibrillation: She appears to be asymptomatic from the standpoint, and thus I feel there is no indication for Holter or event monitoring at this time. She remains on metoprolol. I reviewed her Holter monitor and all previous ECGs dating back to 2009 and I find no objective evidence of atrial fibrillation. For this reason I am not inclined to initiate anticoagulation. 3. Essential HTN: Controlled on lisinopril and metoprolol.  4. Hyperlipidemia: On simvastatin 40 mg daily. Due to have lipids checked at next primary provider visit. 5. Carotid artery stenosis: Will monitor with surveillance Dopplers.  6. Pericardial calcification: Normal pressures in 05/2010. 7. Type 2 diabetes: On insulin.  Dispo: f/u 1 year with carotid Dopplers.  Prentice Docker, M.D., F.A.C.C.

## 2014-01-18 ENCOUNTER — Ambulatory Visit (INDEPENDENT_AMBULATORY_CARE_PROVIDER_SITE_OTHER): Payer: Medicare PPO | Admitting: Family Medicine

## 2014-01-18 ENCOUNTER — Encounter: Payer: Self-pay | Admitting: Family Medicine

## 2014-01-18 VITALS — BP 123/57 | HR 70 | Temp 97.1°F | Ht 59.0 in | Wt 115.0 lb

## 2014-01-18 DIAGNOSIS — F411 Generalized anxiety disorder: Secondary | ICD-10-CM

## 2014-01-18 DIAGNOSIS — E785 Hyperlipidemia, unspecified: Secondary | ICD-10-CM

## 2014-01-18 DIAGNOSIS — E119 Type 2 diabetes mellitus without complications: Secondary | ICD-10-CM

## 2014-01-18 DIAGNOSIS — I10 Essential (primary) hypertension: Secondary | ICD-10-CM

## 2014-01-18 LAB — POCT GLYCOSYLATED HEMOGLOBIN (HGB A1C): Hemoglobin A1C: 7.7

## 2014-01-18 MED ORDER — BD SWAB SINGLE USE REGULAR PADS: 1.0000 | MEDICATED_PAD | Freq: Every day | Status: DC

## 2014-01-18 MED ORDER — ALPRAZOLAM 0.5 MG PO TABS: 0.5000 mg | ORAL_TABLET | Freq: Two times a day (BID) | ORAL | Status: DC

## 2014-01-18 NOTE — Progress Notes (Signed)
   Subjective:    Patient ID: Ellen Woods, female    DOB: 1941-02-17, 73 y.o.   MRN: [id number]  HPI Patient is here for follow up.  She has hx of diabetes, CAD, htn, and hyperlipidemia.  She needs refills on her glucometer, test strips, and lancets.  She has not been using her short acting insulin.   Review of Systems    No chest pain, SOB, HA, dizziness, vision change, N/V, diarrhea, constipation, dysuria, urinary urgency or frequency, myalgias, arthralgias or rash.  Objective:   Physical Exam Vital signs noted  Well developed well nourished female.  HEENT - Head atraumatic Normocephalic                Eyes - PERRLA, Conjuctiva - clear Sclera- Clear EOMI                Ears - EAC's Wnl TM's Wnl Gross Hearing WNL                 Throat - oropharanx wnl Respiratory - Lungs CTA bilateral Cardiac - RRR S1 and S2 without murmur GI - Abdomen soft Nontender and bowel sounds active x 4 Extremities - No edema. Neuro - Grossly intact.  Results for orders placed in visit on 01/18/14  POCT GLYCOSYLATED HEMOGLOBIN (HGB A1C)      Result Value Ref Range   Hemoglobin A1C 7.7         Assessment & Plan:  DM - Plan: POCT glycosylated hemoglobin (Hb A1C), Alcohol Swabs (B-D SINGLE USE SWABS REGULAR) PADS.  hgbaic is not at goal and recommend using SSI humulin R with one meal during the day.  Essential hypertension, benign - Plan: BMP8+EGFR  HYPERLIPIDEMIA - Plan: Lipid panel  Anxiety state, unspecified - Plan: ALPRAZolam (XANAX) 0.5 MG tablet  Follow up in 3 months.  Lysbeth Penner FNP

## 2014-01-19 LAB — LIPID PANEL
Chol/HDL Ratio: 2.2 ratio units (ref 0.0–4.4)
Cholesterol, Total: 165 mg/dL (ref 100–199)
HDL: 76 mg/dL (ref >39)
LDL Calculated: 68 mg/dL (ref 0–99)
Triglycerides: 104 mg/dL (ref 0–149)
VLDL Cholesterol Cal: 21 mg/dL (ref 5–40)

## 2014-01-19 LAB — BMP8+EGFR
BUN/Creatinine Ratio: 26 (ref 11–26)
BUN: 23 mg/dL (ref 8–27)
CO2: 27 mmol/L (ref 18–29)
Calcium: 9.6 mg/dL (ref 8.7–10.3)
Chloride: 101 mmol/L (ref 97–108)
Creatinine, Ser: 0.87 mg/dL (ref 0.57–1.00)
GFR calc Af Amer: 76 mL/min/{1.73_m2} (ref >59)
GFR calc non Af Amer: 66 mL/min/{1.73_m2} (ref >59)
Glucose: 248 mg/dL — ABNORMAL HIGH (ref 65–99)
Potassium: 5.2 mmol/L (ref 3.5–5.2)
Sodium: 141 mmol/L (ref 134–144)

## 2014-02-01 ENCOUNTER — Other Ambulatory Visit: Payer: Self-pay | Admitting: Family Medicine

## 2014-02-15 ENCOUNTER — Encounter: Payer: Self-pay | Admitting: Family

## 2014-02-15 ENCOUNTER — Ambulatory Visit (INDEPENDENT_AMBULATORY_CARE_PROVIDER_SITE_OTHER): Payer: Medicare PPO | Admitting: Family

## 2014-02-15 VITALS — BP 136/60 | HR 75 | Temp 97.1°F | Ht 59.0 in | Wt 117.2 lb

## 2014-02-15 DIAGNOSIS — H8110 Benign paroxysmal vertigo, unspecified ear: Secondary | ICD-10-CM

## 2014-02-15 MED ORDER — INSULIN REGULAR HUMAN 100 UNIT/ML IJ SOLN: INTRAMUSCULAR | Status: DC

## 2014-02-15 MED ORDER — GLUCOSE BLOOD VI STRP: ORAL_STRIP | Status: DC

## 2014-02-15 MED ORDER — MECLIZINE HCL 25 MG PO TABS: 25.0000 mg | ORAL_TABLET | Freq: Three times a day (TID) | ORAL | Status: DC | PRN

## 2014-02-15 NOTE — Progress Notes (Signed)
   Subjective:    Patient ID: Ellen Woods, female    DOB: 11/04/1940, 73 y.o.   MRN: [id number]  Dizziness This is a new problem. The current episode started in the past 7 days. The problem occurs intermittently. The problem has been waxing and waning. Pertinent negatives include no chills, congestion, coughing, fever, headaches, numbness, sore throat, visual change or vomiting. The symptoms are aggravated by bending (Laying down). She has tried nothing for the symptoms.      Review of Systems  Constitutional: Negative.  Negative for fever and chills.  HENT: Negative.  Negative for congestion and sore throat.   Eyes: Negative.   Respiratory: Negative.  Negative for cough and shortness of breath.   Cardiovascular: Negative.  Negative for palpitations.  Gastrointestinal: Negative.  Negative for vomiting.  Endocrine: Negative.   Genitourinary: Negative.   Musculoskeletal: Negative.   Neurological: Positive for dizziness. Negative for numbness and headaches.  Hematological: Negative.   Psychiatric/Behavioral: Negative.   All other systems reviewed and are negative.      Objective:   Physical Exam  Vitals reviewed. Constitutional: She is oriented to person, place, and time. She appears well-developed and well-nourished. No distress.  HENT:  Head: Normocephalic and atraumatic.  Right Ear: External ear normal.  Left Ear: External ear normal.  Nose: Nose normal.  Mouth/Throat: Oropharynx is clear and moist.  Eyes: Pupils are equal, round, and reactive to light.  Neck: Normal range of motion. Neck supple. No thyromegaly present.  Cardiovascular: Normal rate, regular rhythm, normal heart sounds and intact distal pulses.   No murmur heard. Pulmonary/Chest: Effort normal and breath sounds normal. No respiratory distress. She has no wheezes.  Abdominal: Soft. Bowel sounds are normal. She exhibits no distension. There is no tenderness.  Musculoskeletal: Normal range of motion. She  exhibits no edema and no tenderness.  Neurological: She is alert and oriented to person, place, and time. She has normal reflexes. No cranial nerve deficit.  Skin: Skin is warm and dry.  Psychiatric: She has a normal mood and affect. Her behavior is normal. Judgment and thought content normal.    BP 136/60  Pulse 75  Temp(Src) 97.1 F (36.2 C) (Oral)  Ht 4\' 11"  (1.499 m)  Wt 117 lb 3.2 oz (53.162 kg)  BMI 23.66 kg/m2       Assessment & Plan:  1. BPPV (benign paroxysmal positional vertigo), unspecified laterality -Falls precautions discussed -make slow movements -Drink enough fluids -RTO prn - meclizine (ANTIVERT) 25 MG tablet; Take 1 tablet (25 mg total) by mouth 3 (three) times daily as needed for dizziness.  Dispense: 30 tablet; Refill: 0  Jannifer Rodneyhristy Javarius Tsosie, FNP

## 2014-02-15 NOTE — Patient Instructions (Signed)
Benign Positional Vertigo Vertigo means you feel like you or your surroundings are moving when they are not. Benign positional vertigo is the most common form of vertigo. Benign means that the cause of your condition is not serious. Benign positional vertigo is more common in older adults. CAUSES  Benign positional vertigo is the result of an upset in the labyrinth system. This is an area in the middle ear that helps control your balance. This may be caused by a viral infection, head injury, or repetitive motion. However, often no specific cause is found. SYMPTOMS  Symptoms of benign positional vertigo occur when you move your head or eyes in different directions. Some of the symptoms may include:  Loss of balance and falls.  Vomiting.  Blurred vision.  Dizziness.  Nausea.  Involuntary eye movements (nystagmus). DIAGNOSIS  Benign positional vertigo is usually diagnosed by physical exam. If the specific cause of your benign positional vertigo is unknown, your caregiver may perform imaging tests, such as magnetic resonance imaging (MRI) or computed tomography (CT). TREATMENT  Your caregiver may recommend movements or procedures to correct the benign positional vertigo. Medicines such as meclizine, benzodiazepines, and medicines for nausea may be used to treat your symptoms. In rare cases, if your symptoms are caused by certain conditions that affect the inner ear, you may need surgery. HOME CARE INSTRUCTIONS   Follow your caregiver's instructions.  Move slowly. Do not make sudden body or head movements.  Avoid driving.  Avoid operating heavy machinery.  Avoid performing any tasks that would be dangerous to you or others during a vertigo episode.  Drink enough fluids to keep your urine clear or pale yellow. SEEK IMMEDIATE MEDICAL CARE IF:   You develop problems with walking, weakness, numbness, or using your arms, hands, or legs.  You have difficulty speaking.  You develop  severe headaches.  Your nausea or vomiting continues or gets worse.  You develop visual changes.  Your family or friends notice any behavioral changes.  Your condition gets worse.  You have a fever.  You develop a stiff neck or sensitivity to light. MAKE SURE YOU:   Understand these instructions.  Will watch your condition.  Will get help right away if you are not doing well or get worse. Document Released: 01/27/2006 Document Revised: 07/14/2011 Document Reviewed: 01/09/2011 Exit[employer] Patient Information 2015 [employer]. This information is not intended to replace advice given to you by your health care provider. Make sure you discuss any questions you have with your health care provider.    

## 2014-02-16 ENCOUNTER — Other Ambulatory Visit: Payer: Self-pay | Admitting: *Deleted

## 2014-02-16 MED ORDER — INSULIN GLARGINE 100 UNIT/ML ~~LOC~~ SOLN: 15.0000 [IU] | Freq: Every morning | SUBCUTANEOUS | Status: DC

## 2014-03-01 ENCOUNTER — Telehealth: Payer: Self-pay | Admitting: Family Medicine

## 2014-03-01 ENCOUNTER — Other Ambulatory Visit: Payer: Self-pay | Admitting: Family Medicine

## 2014-03-01 NOTE — Telephone Encounter (Signed)
meds have been sent in 02/16/14. PT will check with pharmacy and call us back if any problems.

## 2014-03-02 ENCOUNTER — Other Ambulatory Visit: Payer: Self-pay | Admitting: *Deleted

## 2014-03-02 ENCOUNTER — Ambulatory Visit (INDEPENDENT_AMBULATORY_CARE_PROVIDER_SITE_OTHER): Payer: Medicare PPO

## 2014-03-02 DIAGNOSIS — Z23 Encounter for immunization: Secondary | ICD-10-CM

## 2014-03-13 ENCOUNTER — Other Ambulatory Visit: Payer: Self-pay

## 2014-03-13 MED ORDER — SIMVASTATIN 40 MG PO TABS: ORAL_TABLET | ORAL | Status: DC

## 2014-03-14 ENCOUNTER — Emergency Department (HOSPITAL_COMMUNITY): Payer: Medicare HMO

## 2014-03-14 ENCOUNTER — Inpatient Hospital Stay (HOSPITAL_COMMUNITY)
Admission: EM | Admit: 2014-03-14 | Discharge: 2014-03-17 | DRG: 871 | Disposition: A | Discharge status: Home / Self Care | Payer: Medicare HMO | Attending: Internal Medicine | Admitting: Internal Medicine

## 2014-03-14 ENCOUNTER — Encounter (HOSPITAL_COMMUNITY): Payer: Self-pay | Admitting: Physical Medicine and Rehabilitation

## 2014-03-14 DIAGNOSIS — Z7982 Long term (current) use of aspirin: Secondary | ICD-10-CM | POA: Diagnosis not present

## 2014-03-14 DIAGNOSIS — R6510 Systemic inflammatory response syndrome (SIRS) of non-infectious origin without acute organ dysfunction: Secondary | ICD-10-CM | POA: Diagnosis present

## 2014-03-14 DIAGNOSIS — J189 Pneumonia, unspecified organism: Secondary | ICD-10-CM | POA: Diagnosis present

## 2014-03-14 DIAGNOSIS — A419 Sepsis, unspecified organism: Principal | ICD-10-CM | POA: Diagnosis present

## 2014-03-14 DIAGNOSIS — E872 Acidosis: Secondary | ICD-10-CM | POA: Diagnosis present

## 2014-03-14 DIAGNOSIS — N182 Chronic kidney disease, stage 2 (mild): Secondary | ICD-10-CM | POA: Diagnosis present

## 2014-03-14 DIAGNOSIS — I129 Hypertensive chronic kidney disease with stage 1 through stage 4 chronic kidney disease, or unspecified chronic kidney disease: Secondary | ICD-10-CM | POA: Diagnosis present

## 2014-03-14 DIAGNOSIS — R740 Nonspecific elevation of levels of transaminase and lactic acid dehydrogenase [LDH]: Secondary | ICD-10-CM | POA: Diagnosis present

## 2014-03-14 DIAGNOSIS — K5900 Constipation, unspecified: Secondary | ICD-10-CM | POA: Diagnosis present

## 2014-03-14 DIAGNOSIS — E11649 Type 2 diabetes mellitus with hypoglycemia without coma: Secondary | ICD-10-CM | POA: Diagnosis present

## 2014-03-14 DIAGNOSIS — R109 Unspecified abdominal pain: Secondary | ICD-10-CM

## 2014-03-14 DIAGNOSIS — G92 Toxic encephalopathy: Secondary | ICD-10-CM | POA: Diagnosis present

## 2014-03-14 DIAGNOSIS — R680 Hypothermia, not associated with low environmental temperature: Secondary | ICD-10-CM | POA: Diagnosis present

## 2014-03-14 DIAGNOSIS — R7401 Elevation of levels of liver transaminase levels: Secondary | ICD-10-CM | POA: Diagnosis present

## 2014-03-14 DIAGNOSIS — I5032 Chronic diastolic (congestive) heart failure: Secondary | ICD-10-CM | POA: Diagnosis present

## 2014-03-14 DIAGNOSIS — R410 Disorientation, unspecified: Secondary | ICD-10-CM

## 2014-03-14 DIAGNOSIS — J449 Chronic obstructive pulmonary disease, unspecified: Secondary | ICD-10-CM | POA: Diagnosis present

## 2014-03-14 DIAGNOSIS — K6289 Other specified diseases of anus and rectum: Secondary | ICD-10-CM

## 2014-03-14 DIAGNOSIS — IMO0002 Reserved for concepts with insufficient information to code with codable children: Secondary | ICD-10-CM | POA: Diagnosis present

## 2014-03-14 DIAGNOSIS — N189 Chronic kidney disease, unspecified: Secondary | ICD-10-CM

## 2014-03-14 DIAGNOSIS — I10 Essential (primary) hypertension: Secondary | ICD-10-CM | POA: Diagnosis present

## 2014-03-14 DIAGNOSIS — Z794 Long term (current) use of insulin: Secondary | ICD-10-CM | POA: Diagnosis not present

## 2014-03-14 DIAGNOSIS — I2510 Atherosclerotic heart disease of native coronary artery without angina pectoris: Secondary | ICD-10-CM | POA: Diagnosis present

## 2014-03-14 DIAGNOSIS — I480 Paroxysmal atrial fibrillation: Secondary | ICD-10-CM

## 2014-03-14 DIAGNOSIS — E1165 Type 2 diabetes mellitus with hyperglycemia: Secondary | ICD-10-CM | POA: Diagnosis present

## 2014-03-14 DIAGNOSIS — N179 Acute kidney failure, unspecified: Secondary | ICD-10-CM | POA: Diagnosis present

## 2014-03-14 DIAGNOSIS — E860 Dehydration: Secondary | ICD-10-CM | POA: Diagnosis present

## 2014-03-14 DIAGNOSIS — G9341 Metabolic encephalopathy: Secondary | ICD-10-CM | POA: Diagnosis present

## 2014-03-14 LAB — COMPREHENSIVE METABOLIC PANEL
ALK PHOS: 71 U/L (ref 39–117)
ALT: 40 U/L — AB (ref 0–35)
ANION GAP: 20 — AB (ref 5–15)
AST: 47 U/L — ABNORMAL HIGH (ref 0–37)
Albumin: 3.9 g/dL (ref 3.5–5.2)
BILIRUBIN TOTAL: 0.6 mg/dL (ref 0.3–1.2)
BUN: 18 mg/dL (ref 6–23)
CHLORIDE: 105 meq/L (ref 96–112)
CO2: 20 mEq/L (ref 19–32)
Calcium: 9.1 mg/dL (ref 8.4–10.5)
Creatinine, Ser: 0.71 mg/dL (ref 0.50–1.10)
GFR calc non Af Amer: 84 mL/min — ABNORMAL LOW (ref >90)
GLUCOSE: 172 mg/dL — AB (ref 70–99)
POTASSIUM: 3.8 meq/L (ref 3.7–5.3)
Sodium: 145 mEq/L (ref 137–147)
Total Protein: 6.7 g/dL (ref 6.0–8.3)

## 2014-03-14 LAB — CBC WITH DIFFERENTIAL/PLATELET
Basophils Absolute: 0 10*3/uL (ref 0.0–0.1)
Basophils Relative: 0 % (ref 0–1)
Eosinophils Absolute: 0 10*3/uL (ref 0.0–0.7)
Eosinophils Relative: 0 % (ref 0–5)
HCT: 39.7 % (ref 36.0–46.0)
HEMOGLOBIN: 13 g/dL (ref 12.0–15.0)
LYMPHS ABS: 0.7 10*3/uL (ref 0.7–4.0)
Lymphocytes Relative: 5 % — ABNORMAL LOW (ref 12–46)
MCH: 32.1 pg (ref 26.0–34.0)
MCHC: 32.7 g/dL (ref 30.0–36.0)
MCV: 98 fL (ref 78.0–100.0)
Monocytes Absolute: 0.6 10*3/uL (ref 0.1–1.0)
Monocytes Relative: 4 % (ref 3–12)
NEUTROS ABS: 13.6 10*3/uL — AB (ref 1.7–7.7)
NEUTROS PCT: 91 % — AB (ref 43–77)
Platelets: 234 10*3/uL (ref 150–400)
RBC: 4.05 MIL/uL (ref 3.87–5.11)
RDW: 12 % (ref 11.5–15.5)
WBC: 14.9 10*3/uL — AB (ref 4.0–10.5)

## 2014-03-14 LAB — URINALYSIS, ROUTINE W REFLEX MICROSCOPIC
Bilirubin Urine: NEGATIVE
Glucose, UA: 100 mg/dL — AB
Hgb urine dipstick: NEGATIVE
Ketones, ur: NEGATIVE mg/dL
LEUKOCYTES UA: NEGATIVE
Nitrite: NEGATIVE
PH: 5 (ref 5.0–8.0)
Protein, ur: NEGATIVE mg/dL
SPECIFIC GRAVITY, URINE: 1.015 (ref 1.005–1.030)
UROBILINOGEN UA: 0.2 mg/dL (ref 0.0–1.0)

## 2014-03-14 LAB — I-STAT CG4 LACTIC ACID, ED
LACTIC ACID, VENOUS: 2.48 mmol/L — AB (ref 0.5–2.2)
Lactic Acid, Venous: 5.69 mmol/L — ABNORMAL HIGH (ref 0.5–2.2)

## 2014-03-14 LAB — CBG MONITORING, ED
GLUCOSE-CAPILLARY: 292 mg/dL — AB (ref 70–99)
Glucose-Capillary: 154 mg/dL — ABNORMAL HIGH (ref 70–99)
Glucose-Capillary: 179 mg/dL — ABNORMAL HIGH (ref 70–99)

## 2014-03-14 LAB — GLUCOSE, CAPILLARY: Glucose-Capillary: 297 mg/dL — ABNORMAL HIGH (ref 70–99)

## 2014-03-14 LAB — TSH: TSH: 3.8 u[IU]/mL (ref 0.350–4.500)

## 2014-03-14 LAB — PRO B NATRIURETIC PEPTIDE: PRO B NATRI PEPTIDE: 661.6 pg/mL — AB (ref 0–125)

## 2014-03-14 LAB — MRSA PCR SCREENING: MRSA BY PCR: NEGATIVE

## 2014-03-14 MED ORDER — SODIUM CHLORIDE 0.9 % IV BOLUS (SEPSIS)
1000.0000 mL | Freq: Once | INTRAVENOUS | Status: AC
  Administered 2014-03-14: 1000 mL via INTRAVENOUS

## 2014-03-14 MED ORDER — ONDANSETRON HCL 4 MG/2ML IJ SOLN: 4.0000 mg | Freq: Four times a day (QID) | INTRAMUSCULAR | Status: DC | PRN

## 2014-03-14 MED ORDER — ONDANSETRON HCL 4 MG PO TABS: 4.0000 mg | ORAL_TABLET | Freq: Four times a day (QID) | ORAL | Status: DC | PRN

## 2014-03-14 MED ORDER — SODIUM CHLORIDE 0.9 % IV SOLN
INTRAVENOUS | Status: DC
  Administered 2014-03-14 – 2014-03-17 (×5): via INTRAVENOUS

## 2014-03-14 MED ORDER — POLYETHYLENE GLYCOL 3350 17 G PO PACK
17.0000 g | PACK | Freq: Every day | ORAL | Status: DC
  Administered 2014-03-15 – 2014-03-17 (×2): 17 g via ORAL
  Filled 2014-03-14 (×3): qty 1

## 2014-03-14 MED ORDER — SIMVASTATIN 40 MG PO TABS
40.0000 mg | ORAL_TABLET | Freq: Every day | ORAL | Status: DC
  Administered 2014-03-15: 40 mg via ORAL
  Filled 2014-03-14 (×2): qty 1

## 2014-03-14 MED ORDER — ACETAMINOPHEN 325 MG PO TABS: 650.0000 mg | ORAL_TABLET | Freq: Four times a day (QID) | ORAL | Status: DC | PRN

## 2014-03-14 MED ORDER — POTASSIUM CHLORIDE CRYS ER 20 MEQ PO TBCR
20.0000 meq | EXTENDED_RELEASE_TABLET | Freq: Every day | ORAL | Status: DC
  Administered 2014-03-15 – 2014-03-17 (×3): 20 meq via ORAL
  Filled 2014-03-14 (×4): qty 1

## 2014-03-14 MED ORDER — ACETAMINOPHEN 650 MG RE SUPP
650.0000 mg | Freq: Once | RECTAL | Status: AC
  Administered 2014-03-14: 650 mg via RECTAL
  Filled 2014-03-14: qty 1

## 2014-03-14 MED ORDER — INSULIN ASPART 100 UNIT/ML ~~LOC~~ SOLN
0.0000 [IU] | Freq: Three times a day (TID) | SUBCUTANEOUS | Status: DC
  Administered 2014-03-15: 9 [IU] via SUBCUTANEOUS
  Administered 2014-03-15: 3 [IU] via SUBCUTANEOUS
  Administered 2014-03-15 – 2014-03-16 (×2): 5 [IU] via SUBCUTANEOUS
  Administered 2014-03-16: 7 [IU] via SUBCUTANEOUS
  Administered 2014-03-16: 5 [IU] via SUBCUTANEOUS
  Administered 2014-03-17: 1 [IU] via SUBCUTANEOUS
  Administered 2014-03-17: 2 [IU] via SUBCUTANEOUS

## 2014-03-14 MED ORDER — BISACODYL 10 MG RE SUPP: 10.0000 mg | Freq: Once | RECTAL | Status: DC

## 2014-03-14 MED ORDER — VANCOMYCIN HCL IN DEXTROSE 1-5 GM/200ML-% IV SOLN
1000.0000 mg | Freq: Once | INTRAVENOUS | Status: AC
  Administered 2014-03-14: 1000 mg via INTRAVENOUS
  Filled 2014-03-14: qty 200

## 2014-03-14 MED ORDER — IOHEXOL 350 MG/ML SOLN
80.0000 mL | Freq: Once | INTRAVENOUS | Status: AC | PRN
  Administered 2014-03-14: 80 mL via INTRAVENOUS

## 2014-03-14 MED ORDER — PIPERACILLIN-TAZOBACTAM 3.375 G IVPB
3.3750 g | Freq: Three times a day (TID) | INTRAVENOUS | Status: DC
  Administered 2014-03-14 – 2014-03-15 (×3): 3.375 g via INTRAVENOUS
  Filled 2014-03-14 (×5): qty 50

## 2014-03-14 MED ORDER — PIPERACILLIN-TAZOBACTAM 3.375 G IVPB 30 MIN
3.3750 g | Freq: Once | INTRAVENOUS | Status: AC
  Administered 2014-03-14: 3.375 g via INTRAVENOUS
  Filled 2014-03-14: qty 50

## 2014-03-14 MED ORDER — VANCOMYCIN HCL 500 MG IV SOLR
500.0000 mg | Freq: Two times a day (BID) | INTRAVENOUS | Status: DC
  Administered 2014-03-15 (×2): 500 mg via INTRAVENOUS
  Filled 2014-03-14 (×3): qty 500

## 2014-03-14 MED ORDER — METOPROLOL TARTRATE 25 MG PO TABS
25.0000 mg | ORAL_TABLET | Freq: Two times a day (BID) | ORAL | Status: DC
  Administered 2014-03-14: 25 mg via ORAL
  Filled 2014-03-14 (×3): qty 1

## 2014-03-14 MED ORDER — ASPIRIN EC 81 MG PO TBEC
81.0000 mg | DELAYED_RELEASE_TABLET | Freq: Every day | ORAL | Status: DC
  Administered 2014-03-15 – 2014-03-17 (×3): 81 mg via ORAL
  Filled 2014-03-14 (×3): qty 1

## 2014-03-14 MED ORDER — ENOXAPARIN SODIUM 30 MG/0.3ML ~~LOC~~ SOLN
30.0000 mg | SUBCUTANEOUS | Status: DC
  Administered 2014-03-15: 30 mg via SUBCUTANEOUS
  Filled 2014-03-14: qty 0.3

## 2014-03-14 NOTE — Progress Notes (Signed)
ANTIBIOTIC CONSULT NOTE - INITIAL  Pharmacy Consult for Vancomycin/Zosyn Indication: Sepsis  Allergies  Allergen Reactions  . Azithromycin Other (See Comments)    Hospital reaction  . Codeine     REACTION: nausea    Patient Measurements: Weight: 125 lb (56.7 kg)   Vital Signs: Temp: 102.6 F (39.2 C) (11/10 2010) Temp Source: Rectal (11/10 2010) BP: 119/51 mmHg (11/10 2000) Pulse Rate: 101 (11/10 2000) Intake/Output from previous day:   Intake/Output from this shift:    Labs:  Recent Labs  03/14/14 1320  WBC 14.9*  HGB 13.0  PLT 234  CREATININE 0.71   Estimated Creatinine Clearance: 48.1 mL/min (by C-G formula based on Cr of 0.71). No results for input(s): VANCOTROUGH, VANCOPEAK, VANCORANDOM, GENTTROUGH, GENTPEAK, GENTRANDOM, TOBRATROUGH, TOBRAPEAK, TOBRARND, AMIKACINPEAK, AMIKACINTROU, AMIKACIN in the last 72 hours.   Microbiology: No results found for this or any previous visit (from the past 720 hour(s)).  Medical History: Past Medical History  Diagnosis Date  . Other dyspnea and respiratory abnormality     Resolved  . Unspecified essential hypertension   . Type II or unspecified type diabetes mellitus without mention of complication, not stated as uncontrolled   . Coronary atherosclerosis of native coronary artery     50% LAD stenosis  . Encounter for long-term (current) use of insulin   . Other symptoms involving cardiovascular system   . Pain in joint, pelvic region and thigh   . Shortness of breath   . Other chest pain     Atypical chest pain  . PDA (patent ductus arteriosus)     Small PDA documented by CT angiography, no pulmonary hypertension  . Pericardial calcification     Ruled out for restrictive pericarditis  . Thyroid disease   . Arthritis   . Back pain 1/14    Tx by orthopedics  . Family history of anesthesia complication     Daughter has nausea  . COPD (chronic obstructive pulmonary disease)     Medications:  Scheduled:  .  bisacodyl  10 mg Rectal Once  . polyethylene glycol  17 g Oral Daily   Assessment: [age] yo f who presented to the ED on 11/10 for AMS and hypoglycemia.  Pharmacy is consulted to begin vancomycin and zosyn for presumed sepsis.  Wbc 14.9, tmax 104, SCr 0.71, CrCl ~48 ml/min.  Patient received a 1,000 mg IV load x 1 and zosyn 3.375 gm IV x 1.  Will begin vancomycin 500 mg IV q12hrs and zosyn 3.375 gm IV q8hrs (4-hr infusion)  Vancomycin 11/10 >> Zosyn 11/10 >>  11/10 Bld x2: 11/10 UCx:  Goal of Therapy:  Vancomycin trough level 15-20 mcg/ml  Plan:  Vancomycin 500 mg IV q12hrs Zosyn 3.375 gm IV q8hrs (4-hr infusion) Monitor CBC, temperature, renal function, clinical course  Cassie L. Roseanne RenoStewart, PharmD Clinical Pharmacy Resident Pager: [phone] 03/14/2014 8:24 PM

## 2014-03-14 NOTE — ED Notes (Addendum)
Pt presents to department via [facility] from home for evaluation of altered mental status and hypoglycemia. Family states patient was difficult to arouse this morning. Initial CBG of 30 by EMS. Pt combative and confused en route to hospital. CBG 281 per EMS on arrival. Pt is alert, but unable to follow commands at present. 18g R wrist. Received x1 dextrose and 1mg  narcan per EMS.

## 2014-03-14 NOTE — ED Notes (Signed)
Myself, Crecencio McNikki S, RN and OssianJanee, RN undressed pt, placed in gown, on monitor, continuous pulse oximetry and blood pressure cuff; pt was soiled with feces and urine, pt cleaned and a diaper placed on pt with 4 chuks underneath pt; vitals and rectal temperature performed; the 3 of us assisted Megan, RN when she placed foley temp and bair hugger

## 2014-03-14 NOTE — ED Notes (Signed)
Lactic acid results given to RN Brett CanalesSteve on RexPod D

## 2014-03-14 NOTE — ED Notes (Addendum)
Pt resting quietly at the time. Remains confused. Vital signs stable. Family at bedside. bair Journalist, newspaperhugger in place.

## 2014-03-14 NOTE — ED Notes (Signed)
Patient placed back on monitor and bear hugger.

## 2014-03-14 NOTE — ED Notes (Signed)
Report attempted 

## 2014-03-14 NOTE — ED Notes (Signed)
Daughter left contact numbers:  Ellen Woods @ [phone] or [phone]

## 2014-03-14 NOTE — ED Notes (Signed)
CBG 154. 

## 2014-03-14 NOTE — ED Notes (Signed)
MD at bedside. 

## 2014-03-14 NOTE — ED Provider Notes (Signed)
CSN: [id number]     Arrival date & time 03/14/14  1147 History   First MD Initiated Contact with Patient 03/14/14 1150     Chief Complaint  Patient presents with  . Altered Mental Status  . Hypoglycemia     (Consider location/radiation/quality/duration/timing/severity/associated sxs/prior Treatment) The history is provided by the patient, the EMS personnel and a relative.  Ellen Woods is a 73 y.o. female hx of DM, CAD, COPD here with ultimate status and confusion and hypoglycemia. As per patient's family and EMS, she woke up this morning and has been confused. She is usually alert and takes care of herself. However this morning family noticed that she was laying in bed and diaphoretic. They felt that her sugar was low tried to give her arch shoes but she wouldn't drink it. EMS arrived and noticed CBG of 30. She was given 1 amp of D50 and 1 mg of Narcan and became more awake. She is unable to give any history.   Level V caveat- AMS   Past Medical History  Diagnosis Date  . Other dyspnea and respiratory abnormality     Resolved  . Unspecified essential hypertension   . Type II or unspecified type diabetes mellitus without mention of complication, not stated as uncontrolled   . Coronary atherosclerosis of native coronary artery     50% LAD stenosis  . Encounter for long-term (current) use of insulin   . Other symptoms involving cardiovascular system   . Pain in joint, pelvic region and thigh   . Shortness of breath   . Other chest pain     Atypical chest pain  . PDA (patent ductus arteriosus)     Small PDA documented by CT angiography, no pulmonary hypertension  . Pericardial calcification     Ruled out for restrictive pericarditis  . Thyroid disease   . Arthritis   . Back pain 1/14    Tx by orthopedics  . Family history of anesthesia complication     Daughter has nausea  . COPD (chronic obstructive pulmonary disease)    Past Surgical History  Procedure Laterality Date   . Incisional hernia repair    . Abdominal hysterectomy    . Tubal ligation    . Intramedullary (im) nail intertrochanteric Left 11/13/2012    Procedure: INTRAMEDULLARY (IM) NAIL INTERTROCHANTRIC LEFT HIP;  Surgeon: Hessie Dibble, MD;  Location: [facility];  Service: Orthopedics;  Laterality: Left;   Family History  Problem Relation Age of Onset  . Stroke Mother    History  Substance Use Topics  . Smoking status: Never Smoker   . Smokeless tobacco: Never Used  . Alcohol Use: No   OB History    No data available     Review of Systems  Unable to perform ROS: Mental status change      Allergies  Azithromycin and Codeine  Home Medications   Prior to Admission medications   Medication Sig Start Date End Date Taking? Authorizing Provider  acetaminophen (TYLENOL) 325 MG tablet Take 2 tablets (650 mg total) by mouth every 6 (six) hours as needed. Patient taking differently: Take 650 mg by mouth every 6 (six) hours as needed for moderate pain.  11/18/12  Yes Ripudeep Krystal Eaton, MD  alendronate (FOSAMAX) 70 MG tablet TAKE 1 TABLET EVERY WEEK 06/13/13  Yes Chipper Herb, MD  ALPRAZolam Duanne Moron) 0.5 MG tablet Take 1 tablet (0.5 mg total) by mouth 2 (two) times daily. Patient taking differently: Take 0.5 mg  by mouth 2 (two) times daily as needed for anxiety or sleep.  01/18/14  Yes Lysbeth Penner, FNP  aspirin 81 MG tablet Take 81 mg by mouth daily.   Yes Historical Provider, MD  CALCIUM-MAGNESIUM-ZINC PO Take 1 tablet by mouth daily.   Yes Historical Provider, MD  cetirizine (ZYRTEC) 10 MG tablet Take 1 tablet (10 mg total) by mouth daily. 10/06/13  Yes Lysbeth Penner, FNP  Docusate Sodium (DSS) 100 MG CAPS Take 100 mg by mouth as needed (for stool softner).  11/18/12  Yes Ripudeep Krystal Eaton, MD  ferrous sulfate 325 (65 FE) MG tablet Take 1 tablet (325 mg total) by mouth 2 (two) times daily with a meal. 11/18/12  Yes Ripudeep K Rai, MD  fluticasone (FLONASE) 50 MCG/ACT nasal spray Place 1 spray into  both nostrils daily as needed for allergies.  09/01/12  Yes Historical Provider, MD  furosemide (LASIX) 20 MG tablet Take 1 tablet (20 mg total) by mouth daily. 10/11/13  Yes Lysbeth Penner, FNP  insulin glargine (LANTUS) 100 UNIT/ML injection Inject 0.15 mLs (15 Units total) into the skin every morning. 02/16/14  Yes Chipper Herb, MD  insulin regular (NOVOLIN R,HUMULIN R) 100 units/mL injection Sliding scale : CBG < 70: Drink juice; CBG 70 - 120: 0 units: CBG 121 - 150: 2 units; CBG 151 - 200: 3 units; CBG 201 - 250: 5 units; CBG 251 - 300: 8 units;CBG 301 - 350: 11 units; CBG 351 - 400: 15 units; CBG > 400 : 15 units and notify MD 02/15/14  Yes Sharion Balloon, FNP  lisinopril (PRINIVIL,ZESTRIL) 5 MG tablet TAKE 1 TABLET EVERY DAY 03/02/14  Yes Chipper Herb, MD  meclizine (ANTIVERT) 25 MG tablet Take 1 tablet (25 mg total) by mouth 3 (three) times daily as needed for dizziness. 02/15/14  Yes Sharion Balloon, FNP  metoprolol tartrate (LOPRESSOR) 25 MG tablet TAKE 1 TABLET TWICE DAILY 10/10/13  Yes Lysbeth Penner, FNP  Multiple Vitamin (MULTIVITAMIN) tablet Take 1 tablet by mouth daily.   Yes Historical Provider, MD  NITROSTAT 0.4 MG SL tablet Place 1 tablet (0.4 mg total) under the tongue every 5 (five) minutes as needed for chest pain. 09/08/12  Yes Mary-Margaret Hassell Done, FNP  potassium chloride SA (K-DUR,KLOR-CON) 20 MEQ tablet Take 1 tablet (20 mEq total) by mouth daily. 10/25/13  Yes Lysbeth Penner, FNP  Prenatal Vit-Fe Fumarate-FA (PRENATAL MULTIVITAMIN) TABS tablet Take 1 tablet by mouth daily at 12 noon.   Yes Historical Provider, MD  riboflavin (VITAMIN B-2) 100 MG TABS tablet Take 100 mg by mouth daily.   Yes Historical Provider, MD  simvastatin (ZOCOR) 40 MG tablet TAKE 1 TABLET EVERY EVENING 03/13/14  Yes Chipper Herb, MD  vitamin B-12 (CYANOCOBALAMIN) 1000 MCG tablet Take 1 tablet (1,000 mcg total) by mouth daily. 10/25/12  Yes Lysbeth Penner, FNP  vitamin C (ASCORBIC ACID) 500 MG  tablet Take 500 mg by mouth daily.   Yes Historical Provider, MD  ACCU-CHEK FASTCLIX LANCETS MISC USE FOR FINGERSTICK BLOOD SUGAR TESTING SIX TIMES DAILY 02/02/14   Lysbeth Penner, FNP  Alcohol Swabs (B-D SINGLE USE SWABS REGULAR) PADS USE FOR FINGERSTICK BLOOD SUGAR TESTING SIX TIMES DAILY 02/02/14   Lysbeth Penner, FNP  B-D UF III MINI PEN NEEDLES 31G X 5 MM MISC USE WITH PENS UP TO FOUR TIMES DAILY 01/04/13   Mary-Margaret Hassell Done, FNP  Blood Glucose Monitoring Suppl (ACCU-CHEK NANO SMARTVIEW) W/DEVICE KIT  USE FOR FINGERSTICK BLOOD SUGAR TESTING SIX TIMES DAILY 02/02/14   Lysbeth Penner, FNP  fexofenadine-pseudoephedrine (ALLEGRA-D 24) 180-240 MG per 24 hr tablet 1 tablet daily.  10/03/13   Historical Provider, MD  glucose blood (ACCU-CHEK SMARTVIEW) test strip USE FOR FINGERSTICK BLOOD SUGAR TESTING SIX TIMES DAILY 02/15/14   Sharion Balloon, FNP  levothyroxine (SYNTHROID, LEVOTHROID) 25 MCG tablet  03/09/14   Historical Provider, MD  levothyroxine (SYNTHROID, LEVOTHROID) 50 MCG tablet  03/09/14   Historical Provider, MD  ZOSTAVAX 78938 UNT/0.65ML injection  09/02/13   Historical Provider, MD   BP 164/60 mmHg  Pulse 112  Temp(Src) 96.4 F (35.8 C) (Rectal)  Resp 18  SpO2 99% Physical Exam  Constitutional:  Altered, confused   HENT:  Head: Normocephalic.  MM dry   Eyes: Conjunctivae and EOM are normal. Pupils are equal, round, and reactive to light.  Neck: Normal range of motion. Neck supple.  Cardiovascular: Regular rhythm and normal heart sounds.   Tachycardic   Pulmonary/Chest:  Diminished breath sounds bilaterally.   Abdominal: Soft.  Mild diffuse tenderness, no rebound   Musculoskeletal: Normal range of motion. She exhibits no edema or tenderness.  Neurological:  Confused, moving all extremities.   Skin: Skin is warm and dry.  Psychiatric:  Unable   Nursing note and vitals reviewed.   ED Course  Procedures (including critical care time) Labs Review Labs Reviewed  CBC  WITH DIFFERENTIAL - Abnormal; Notable for the following:    WBC 14.9 (*)    Neutrophils Relative % 91 (*)    Neutro Abs 13.6 (*)    Lymphocytes Relative 5 (*)    All other components within normal limits  COMPREHENSIVE METABOLIC PANEL - Abnormal; Notable for the following:    Glucose, Bld 172 (*)    AST 47 (*)    ALT 40 (*)    GFR calc non Af Amer 84 (*)    Anion gap 20 (*)    All other components within normal limits  URINALYSIS, ROUTINE W REFLEX MICROSCOPIC - Abnormal; Notable for the following:    Glucose, UA 100 (*)    All other components within normal limits  CBG MONITORING, ED - Abnormal; Notable for the following:    Glucose-Capillary 154 (*)    All other components within normal limits  I-STAT CG4 LACTIC ACID, ED - Abnormal; Notable for the following:    Lactic Acid, Venous 5.69 (*)    All other components within normal limits  CULTURE, BLOOD (ROUTINE X 2)  CULTURE, BLOOD (ROUTINE X 2)  URINE CULTURE  TSH  PRO B NATRIURETIC PEPTIDE  I-STAT CG4 LACTIC ACID, ED    Imaging Review Ct Head Wo Contrast  03/14/2014   CLINICAL DATA:  73 year old diabetic female with confusion. Initial encounter.  EXAM: CT HEAD WITHOUT CONTRAST  TECHNIQUE: Contiguous axial images were obtained from the base of the skull through the vertex without intravenous contrast.  COMPARISON:  08/15/2003 MR.  No comparison CT.  FINDINGS: Exam is slightly motion degraded.  No intracranial hemorrhage.  Small vessel disease type changes without CT evidence of large acute infarct.  Atrophy without hydrocephalus.  No intracranial mass lesion noted on this unenhanced exam.  Vascular calcifications.  No intracranial mass lesion noted on this unenhanced exam.  IMPRESSION: Exam is slightly motion degraded.  No intracranial hemorrhage.  Small vessel disease type changes without CT evidence of large acute infarct.  Atrophy without hydrocephalus.   Electronically Signed   By: Richardson Landry  Jeannine Kitten M.D.   On: 03/14/2014 13:10   Dg  Chest Port 1 View  03/14/2014   CLINICAL DATA:  Altered mental status, hypoglycemia. COPD, diabetic.  EXAM: PORTABLE CHEST - 1 VIEW  COMPARISON:  11/12/2012 .  FINDINGS: Mild cardiomegaly. Diffuse interstitial prominence throughout the lungs has increased since prior study. This could reflect mild interstitial edema. No confluent opacities or effusions. No acute bony abnormality.  IMPRESSION: Mild diffuse interstitial prominence throughout the lungs, question interstitial edema.   Electronically Signed   By: Rolm Baptise M.D.   On: 03/14/2014 12:48     EKG Interpretation None      MDM   Final diagnoses:  Confusion  Abdominal pain    DALIA JOLLIE is a 73 y.o. female here with AMS. Patient tachy, hypothermic. Concerned for sepsis. Will get labs, cultures, lactate.   2:50 pm WBC 13. Lactate 5.6. UA and CXR showed no obvious infection. But she appears septic so will get CT angio ab to r/o mesenteric ischemia. Ordered vanc/zosyn empirically.    3:57 PM CT showed proctitis. Tried to do rectal exam but she is having a bowel movement with soft stool so not impacted. Will admit.     Wandra Arthurs, MD 03/14/14 [phone]

## 2014-03-14 NOTE — H&P (Signed)
[facility] History and Physical  SHADOW STIGGERS FVC:[id number] DOB: 1941-04-15 DOA: 03/14/2014  Referring physician: EDP PCP: Redge Gainer, MD   Chief Complaint: not acting right  HPI: Ellen Woods is a 73 y.o. female with PMh of DM on Insulin, HTN, chronic constipation, CAD lives with her daughter and was brought to the ER by her daughter after she found her poorly responsive and diaphoretic this morning. She was in her usual state of health until last pm, subsequently EMS was called this morning and was found to be hypoglycemic with CBG in 30s which improved to 130s with Dextrose per EMS, despite this remians confused and hence brought to the ER. She is unable to provide any history due to confusion, daughter denies any fevers or chills, no N/V/Abd pain, daughetr reports chronci constipation. No cough, congestion or dyspnea. In ER, noted to be hypothermic, with leukocytosis, elevated lactic acid, CXR/UA unremarkable, CT abd with fecal impaction and mild wall thickening in rectum ? proctitis   Review of Systems: unable to obtain due to confusion Constitutional:  No weight loss, night sweats, Fevers, chills, fatigue.  HEENT:  No headaches, Difficulty swallowing,Tooth/dental problems,Sore throat,  No sneezing, itching, ear ache, nasal congestion, post nasal drip,  Cardio-vascular:  No chest pain, Orthopnea, PND, swelling in lower extremities, anasarca, dizziness, palpitations  GI:  No heartburn, indigestion, abdominal pain, nausea, vomiting, diarrhea, change in bowel habits, loss of appetite  Resp:  No shortness of breath with exertion or at rest. No excess mucus, no productive cough, No non-productive cough, No coughing up of blood.No change in color of mucus.No wheezing.No chest wall deformity  Skin:  no rash or lesions.  GU:  no dysuria, change in color of urine, no urgency or frequency. No flank pain.  Musculoskeletal:  No joint pain or swelling. No decreased range  of motion. No back pain.  Psych:  No change in mood or affect. No depression or anxiety. No memory loss.   Past Medical History  Diagnosis Date  . Other dyspnea and respiratory abnormality     Resolved  . Unspecified essential hypertension   . Type II or unspecified type diabetes mellitus without mention of complication, not stated as uncontrolled   . Coronary atherosclerosis of native coronary artery     50% LAD stenosis  . Encounter for long-term (current) use of insulin   . Other symptoms involving cardiovascular system   . Pain in joint, pelvic region and thigh   . Shortness of breath   . Other chest pain     Atypical chest pain  . PDA (patent ductus arteriosus)     Small PDA documented by CT angiography, no pulmonary hypertension  . Pericardial calcification     Ruled out for restrictive pericarditis  . Thyroid disease   . Arthritis   . Back pain 1/14    Tx by orthopedics  . Family history of anesthesia complication     Daughter has nausea  . COPD (chronic obstructive pulmonary disease)    Past Surgical History  Procedure Laterality Date  . Incisional hernia repair    . Abdominal hysterectomy    . Tubal ligation    . Intramedullary (im) nail intertrochanteric Left 11/13/2012    Procedure: INTRAMEDULLARY (IM) NAIL INTERTROCHANTRIC LEFT HIP;  Surgeon: Hessie Dibble, MD;  Location: [place];  Service: Orthopedics;  Laterality: Left;   Social History:  reports that she has never smoked. She has never used smokeless tobacco. She reports that she does  not drink alcohol or use illicit drugs.  Allergies  Allergen Reactions  . Azithromycin Other (See Comments)    Hospital reaction  . Codeine     REACTION: nausea    Family History  Problem Relation Age of Onset  . Stroke Mother      Prior to Admission medications   Medication Sig Start Date End Date Taking? Authorizing Provider  acetaminophen (TYLENOL) 325 MG tablet Take 2 tablets (650 mg total) by mouth every 6 (six)  hours as needed. Patient taking differently: Take 650 mg by mouth every 6 (six) hours as needed for moderate pain.  11/18/12  Yes Ripudeep Krystal Eaton, MD  alendronate (FOSAMAX) 70 MG tablet TAKE 1 TABLET EVERY WEEK 06/13/13  Yes Chipper Herb, MD  ALPRAZolam Duanne Moron) 0.5 MG tablet Take 1 tablet (0.5 mg total) by mouth 2 (two) times daily. Patient taking differently: Take 0.5 mg by mouth 2 (two) times daily as needed for anxiety or sleep.  01/18/14  Yes Lysbeth Penner, FNP  aspirin 81 MG tablet Take 81 mg by mouth daily.   Yes Historical Provider, MD  CALCIUM-MAGNESIUM-ZINC PO Take 1 tablet by mouth daily.   Yes Historical Provider, MD  cetirizine (ZYRTEC) 10 MG tablet Take 1 tablet (10 mg total) by mouth daily. 10/06/13  Yes Lysbeth Penner, FNP  Docusate Sodium (DSS) 100 MG CAPS Take 100 mg by mouth as needed (for stool softner).  11/18/12  Yes Ripudeep Krystal Eaton, MD  ferrous sulfate 325 (65 FE) MG tablet Take 1 tablet (325 mg total) by mouth 2 (two) times daily with a meal. 11/18/12  Yes Ripudeep K Rai, MD  fluticasone (FLONASE) 50 MCG/ACT nasal spray Place 1 spray into both nostrils daily as needed for allergies.  09/01/12  Yes Historical Provider, MD  furosemide (LASIX) 20 MG tablet Take 1 tablet (20 mg total) by mouth daily. 10/11/13  Yes Lysbeth Penner, FNP  insulin glargine (LANTUS) 100 UNIT/ML injection Inject 0.15 mLs (15 Units total) into the skin every morning. 02/16/14  Yes Chipper Herb, MD  insulin regular (NOVOLIN R,HUMULIN R) 100 units/mL injection Sliding scale : CBG < 70: Drink juice; CBG 70 - 120: 0 units: CBG 121 - 150: 2 units; CBG 151 - 200: 3 units; CBG 201 - 250: 5 units; CBG 251 - 300: 8 units;CBG 301 - 350: 11 units; CBG 351 - 400: 15 units; CBG > 400 : 15 units and notify MD 02/15/14  Yes Sharion Balloon, FNP  lisinopril (PRINIVIL,ZESTRIL) 5 MG tablet TAKE 1 TABLET EVERY DAY 03/02/14  Yes Chipper Herb, MD  meclizine (ANTIVERT) 25 MG tablet Take 1 tablet (25 mg total) by mouth 3 (three)  times daily as needed for dizziness. 02/15/14  Yes Sharion Balloon, FNP  metoprolol tartrate (LOPRESSOR) 25 MG tablet TAKE 1 TABLET TWICE DAILY 10/10/13  Yes Lysbeth Penner, FNP  Multiple Vitamin (MULTIVITAMIN) tablet Take 1 tablet by mouth daily.   Yes Historical Provider, MD  NITROSTAT 0.4 MG SL tablet Place 1 tablet (0.4 mg total) under the tongue every 5 (five) minutes as needed for chest pain. 09/08/12  Yes Mary-Margaret Hassell Done, FNP  potassium chloride SA (K-DUR,KLOR-CON) 20 MEQ tablet Take 1 tablet (20 mEq total) by mouth daily. 10/25/13  Yes Lysbeth Penner, FNP  Prenatal Vit-Fe Fumarate-FA (PRENATAL MULTIVITAMIN) TABS tablet Take 1 tablet by mouth daily at 12 noon.   Yes Historical Provider, MD  riboflavin (VITAMIN B-2) 100 MG TABS tablet Take  100 mg by mouth daily.   Yes Historical Provider, MD  simvastatin (ZOCOR) 40 MG tablet TAKE 1 TABLET EVERY EVENING 03/13/14  Yes Chipper Herb, MD  vitamin B-12 (CYANOCOBALAMIN) 1000 MCG tablet Take 1 tablet (1,000 mcg total) by mouth daily. 10/25/12  Yes Lysbeth Penner, FNP  vitamin C (ASCORBIC ACID) 500 MG tablet Take 500 mg by mouth daily.   Yes Historical Provider, MD  ACCU-CHEK FASTCLIX LANCETS MISC USE FOR FINGERSTICK BLOOD SUGAR TESTING SIX TIMES DAILY 02/02/14   Lysbeth Penner, FNP  Alcohol Swabs (B-D SINGLE USE SWABS REGULAR) PADS USE FOR FINGERSTICK BLOOD SUGAR TESTING SIX TIMES DAILY 02/02/14   Lysbeth Penner, FNP  B-D UF III MINI PEN NEEDLES 31G X 5 MM MISC USE WITH PENS UP TO FOUR TIMES DAILY 01/04/13   Mary-Margaret Hassell Done, FNP  Blood Glucose Monitoring Suppl (ACCU-CHEK NANO SMARTVIEW) W/DEVICE KIT USE FOR FINGERSTICK BLOOD SUGAR TESTING SIX TIMES DAILY 02/02/14   Lysbeth Penner, FNP  fexofenadine-pseudoephedrine (ALLEGRA-D 24) 180-240 MG per 24 hr tablet 1 tablet daily.  10/03/13   Historical Provider, MD  glucose blood (ACCU-CHEK SMARTVIEW) test strip USE FOR FINGERSTICK BLOOD SUGAR TESTING SIX TIMES DAILY 02/15/14   Sharion Balloon, FNP    levothyroxine (SYNTHROID, LEVOTHROID) 25 MCG tablet  03/09/14   Historical Provider, MD  levothyroxine (SYNTHROID, LEVOTHROID) 50 MCG tablet  03/09/14   Historical Provider, MD  ZOSTAVAX 44975 UNT/0.65ML injection  09/02/13   Historical Provider, MD   Physical Exam: Filed Vitals:   03/14/14 1203 03/14/14 1300 03/14/14 1417 03/14/14 1519  BP: 178/108 133/45 164/60   Pulse: 115 113 112 110  Temp: 96.4 F (35.8 C)     TempSrc: Rectal     Resp: 18  18 18   SpO2: 100% 100% 99% 95%    Wt Readings from Last 3 Encounters:  02/15/14 53.162 kg (117 lb 3.2 oz)  01/18/14 52.164 kg (115 lb)  01/12/14 52.617 kg (116 lb)    General:  Appears calm and comfortable, confused, mumbles yes  Eyes: PERRL, normal lids, irises & conjunctiva ENT: grossly normal  lips & tongue Neck: no LAD, masses or thyromegaly Cardiovascular: RRR, no m/r/g. No LE edema. Respiratory: diminished BS at bases, CTAB Abdomen: soft, nt, nd, bs present Skin: no rash or induration seen on limited exam Musculoskeletal: grossly normal tone BUE/BLE Psychiatric: grossly normal mood and affect, speech fluent and appropriate Neurologic: moves all extremities on command, no localizing signs          Labs on Admission:  Basic Metabolic Panel:  Recent Labs Lab 03/14/14 1320  NA 145  K 3.8  CL 105  CO2 20  GLUCOSE 172*  BUN 18  CREATININE 0.71  CALCIUM 9.1   Liver Function Tests:  Recent Labs Lab 03/14/14 1320  AST 47*  ALT 40*  ALKPHOS 71  BILITOT 0.6  PROT 6.7  ALBUMIN 3.9   No results for input(s): LIPASE, AMYLASE in the last 168 hours. No results for input(s): AMMONIA in the last 168 hours. CBC:  Recent Labs Lab 03/14/14 1320  WBC 14.9*  NEUTROABS 13.6*  HGB 13.0  HCT 39.7  MCV 98.0  PLT 234   Cardiac Enzymes: No results for input(s): CKTOTAL, CKMB, CKMBINDEX, TROPONINI in the last 168 hours.  BNP (last 3 results)  Recent Labs  03/14/14 1435  PROBNP 661.6*   CBG:  Recent Labs Lab  03/14/14 1204  GLUCAP 154*    Radiological Exams on Admission: Ct Head  Wo Contrast  03/14/2014   CLINICAL DATA:  73 year old diabetic female with confusion. Initial encounter.  EXAM: CT HEAD WITHOUT CONTRAST  TECHNIQUE: Contiguous axial images were obtained from the base of the skull through the vertex without intravenous contrast.  COMPARISON:  08/15/2003 MR.  No comparison CT.  FINDINGS: Exam is slightly motion degraded.  No intracranial hemorrhage.  Small vessel disease type changes without CT evidence of large acute infarct.  Atrophy without hydrocephalus.  No intracranial mass lesion noted on this unenhanced exam.  Vascular calcifications.  No intracranial mass lesion noted on this unenhanced exam.  IMPRESSION: Exam is slightly motion degraded.  No intracranial hemorrhage.  Small vessel disease type changes without CT evidence of large acute infarct.  Atrophy without hydrocephalus.   Electronically Signed   By: Chauncey Cruel M.D.   On: 03/14/2014 13:10   Dg Chest Port 1 View  03/14/2014   CLINICAL DATA:  Altered mental status, hypoglycemia. COPD, diabetic.  EXAM: PORTABLE CHEST - 1 VIEW  COMPARISON:  11/12/2012 .  FINDINGS: Mild cardiomegaly. Diffuse interstitial prominence throughout the lungs has increased since prior study. This could reflect mild interstitial edema. No confluent opacities or effusions. No acute bony abnormality.  IMPRESSION: Mild diffuse interstitial prominence throughout the lungs, question interstitial edema.   Electronically Signed   By: Rolm Baptise M.D.   On: 03/14/2014 12:48   Ct Cta Abd/pel W/cm &/or W/o Cm  03/14/2014   CLINICAL DATA:  Abdominal pain. Elevated lactate. Rule out mesenteric ischemia.  EXAM: CTA ABDOMEN AND PELVIS wITHOUT AND WITH CONTRAST  TECHNIQUE: Multidetector CT imaging of the abdomen and pelvis was performed using the standard protocol during bolus administration of intravenous contrast. Multiplanar reconstructed images and MIPs were obtained and  reviewed to evaluate the vascular anatomy.  CONTRAST:  78m OMNIPAQUE IOHEXOL 350 MG/ML SOLN  COMPARISON:  None.  FINDINGS: The aorta is heavily calcified. Superior mesenteric artery and celiac artery are widely patent without stenosis. Inferior mesenteric artery is stenotic at its origin but patent. Single left renal arteries widely patent. 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 The right renal artery arises from the aorta at the bifurcation without visible stenosis.  Calcifications within the liver and spleen compatible with old granulomatous disease. Gallbladder, pancreas, adrenals and left kidney are unremarkable. Again, right kidney is within the pelvis. Areas of cortical loss and scarring within the right kidney.  Appendix is visualized and is normal. Stomach and small bowel decompressed, grossly unremarkable.  The rectum is dilated and stool filled. There is mild rectal wall thickening posteriorly and significant edema/ stranding in the presacral space and around the lateral walls of the rectum. Findings suggestive of proctitis. Probable fecal impaction as well. No free fluid, free air or adenopathy.  Prior hysterectomy. Urinary bladder is decompressed with Foley catheter in place. No acute bony abnormality or focal bone lesion.  Cardiomegaly. Calcifications within the posterior pericardium compatible with calcific pericarditis. Left lower lobe atelectasis or early infiltrate. Right lung is clear. No effusions.  Review of the MIP images confirms the above findings.  IMPRESSION: Dilated stool-filled rectum suggesting fecal impaction. There is also mild wall thickening in the rectum and stranding in the perirectal soft tissues. Findings suggestive of proctitis.  Right pelvic kidney.  No evidence of bowel ischemia.  Old granulomas disease in the liver and spleen.  Calcific pericarditis.  Cardiomegaly.  Left lower lobe atelectasis or infiltrate.   Electronically Signed   By: KRolm BaptiseM.D.  On:  03/14/2014 15:46   Assessment/Plan    SIRS -source not clear, UA and CXR unremarkable -CT Abd with ? Proctitis and Left lower lobe atelectasis vs infiltrate -Empiric Abx- IV Vanc/Zosyn -IVF, monitor vitals, urine output, lactic acid in am, -repeat CXR in am -FU Blood Cx    Toxic metabolic encephalopathy -due to 1, hypoglycemia -exam non focal, CT head-non acute, monitor    Essential hypertension, benign -stable, hold ACE, continue metoprolol    DM/Hypoglycemia -improved, hold lantus, Sensitive SSI for now    Proctitis/fecal impaction -doubt active infection here -add dulcolax suppositories, miralax PRN    h/o CAD -continue ASA/metoprolol  Code Status: Full Code DVT Prophylaxis: lovenox Family Communication: d/w daughter at bedside Disposition Plan: inaptient  Time spent: 67mn  Paisyn Guercio [facility] Pager [phone]

## 2014-03-15 ENCOUNTER — Ambulatory Visit (HOSPITAL_COMMUNITY): Payer: Self-pay | Admitting: Cardiology

## 2014-03-15 DIAGNOSIS — E860 Dehydration: Secondary | ICD-10-CM

## 2014-03-15 DIAGNOSIS — K5900 Constipation, unspecified: Secondary | ICD-10-CM | POA: Diagnosis present

## 2014-03-15 DIAGNOSIS — N179 Acute kidney failure, unspecified: Secondary | ICD-10-CM

## 2014-03-15 DIAGNOSIS — I517 Cardiomegaly: Secondary | ICD-10-CM

## 2014-03-15 LAB — CK: Total CK: 811 U/L — ABNORMAL HIGH (ref 7–177)

## 2014-03-15 LAB — INFLUENZA PANEL BY PCR (TYPE A & B)
H1N1 flu by pcr: NOT DETECTED
INFLAPCR: NEGATIVE
Influenza B By PCR: NEGATIVE

## 2014-03-15 LAB — COMPREHENSIVE METABOLIC PANEL
ALT: 32 U/L (ref 0–35)
ANION GAP: 19 — AB (ref 5–15)
AST: 50 U/L — ABNORMAL HIGH (ref 0–37)
Albumin: 3.2 g/dL — ABNORMAL LOW (ref 3.5–5.2)
Alkaline Phosphatase: 56 U/L (ref 39–117)
BUN: 21 mg/dL (ref 6–23)
CALCIUM: 7.9 mg/dL — AB (ref 8.4–10.5)
CO2: 19 mEq/L (ref 19–32)
Chloride: 102 mEq/L (ref 96–112)
Creatinine, Ser: 0.98 mg/dL (ref 0.50–1.10)
GFR calc non Af Amer: 56 mL/min — ABNORMAL LOW (ref >90)
GFR, EST AFRICAN AMERICAN: 65 mL/min — AB (ref >90)
GLUCOSE: 389 mg/dL — AB (ref 70–99)
Potassium: 4.6 mEq/L (ref 3.7–5.3)
Sodium: 140 mEq/L (ref 137–147)
Total Bilirubin: 0.9 mg/dL (ref 0.3–1.2)
Total Protein: 5.9 g/dL — ABNORMAL LOW (ref 6.0–8.3)

## 2014-03-15 LAB — CBC
HEMATOCRIT: 35.1 % — AB (ref 36.0–46.0)
HEMOGLOBIN: 11.9 g/dL — AB (ref 12.0–15.0)
MCH: 32.8 pg (ref 26.0–34.0)
MCHC: 33.9 g/dL (ref 30.0–36.0)
MCV: 96.7 fL (ref 78.0–100.0)
Platelets: 183 10*3/uL (ref 150–400)
RBC: 3.63 MIL/uL — AB (ref 3.87–5.11)
RDW: 12.1 % (ref 11.5–15.5)
WBC: 13.9 10*3/uL — ABNORMAL HIGH (ref 4.0–10.5)

## 2014-03-15 LAB — URINE CULTURE
Colony Count: NO GROWTH
Culture: NO GROWTH

## 2014-03-15 LAB — GLUCOSE, CAPILLARY
Glucose-Capillary: 359 mg/dL — ABNORMAL HIGH (ref 70–99)
Glucose-Capillary: 296 mg/dL — ABNORMAL HIGH (ref 70–99)
Glucose-Capillary: 243 mg/dL — ABNORMAL HIGH (ref 70–99)

## 2014-03-15 LAB — LACTIC ACID, PLASMA: Lactic Acid, Venous: 2.4 mmol/L — ABNORMAL HIGH (ref 0.5–2.2)

## 2014-03-15 MED ORDER — LEVOFLOXACIN IN D5W 750 MG/150ML IV SOLN
750.0000 mg | INTRAVENOUS | Status: DC
  Administered 2014-03-15: 750 mg via INTRAVENOUS
  Filled 2014-03-15 (×2): qty 150

## 2014-03-15 MED ORDER — ENOXAPARIN SODIUM 40 MG/0.4ML ~~LOC~~ SOLN
40.0000 mg | SUBCUTANEOUS | Status: DC
  Administered 2014-03-16 – 2014-03-17 (×2): 40 mg via SUBCUTANEOUS
  Filled 2014-03-15 (×2): qty 0.4

## 2014-03-15 MED ORDER — INSULIN GLARGINE 100 UNIT/ML ~~LOC~~ SOLN
5.0000 [IU] | Freq: Every day | SUBCUTANEOUS | Status: DC
  Administered 2014-03-15: 5 [IU] via SUBCUTANEOUS
  Filled 2014-03-15 (×2): qty 0.05

## 2014-03-15 NOTE — Progress Notes (Signed)
[facility]  Ellen Woods WUJ:[id number] DOB: [birth year] DOA: 03/14/2014 PCP: Rudi Heap, MD  Brief narrative: 73 year old female patient with history of diabetes on insulin, hypertension, chronic constipation and CAD. She lives with her daughter. She was brought to the ER after being found poorly responsive and diaphoretic. She was in her usual state of health until 24 hours prior to presentation. Because of her symptoms her daughter called EMS. They checked her CBG and she was hypoglycemic with a reading of 30. She was given dextrose and her CBG improved to 130. She remained confused so she was brought to the ER.  In the ER she was noted to be hypothermic with leukocytosis, elevated lactic acid. Chest x-ray and urinalysis were unremarkable. CT the abdomen and pelvis revealed fecal impaction and mild wall thickening in the rectum consistent with probable proctitis.  HPI/Subjective: Patient now alert although she still seems a little confused especially regarding short-term memory. She denies issues with abdominal pain or poor intake over the past several days.  Assessment/Plan:  Proctitis/Constipation Currently not complaining of rectal discomfort-likely poor intake leading to dehydration leading to her admitting symptoms-follow  SIRS - ?LLL CAP Developed fever up to 104 after admission-mentation has improved-had marked lactic acidosis and hypoglycemia at presentation both of which have improved or resolved-unclear if actual infectious source involved-patient denying cough and currently does not have respiratory symptoms but as precaution given high fever will check influenza PCR-neurologically has improved and does not have any obvious meningeal signs and no indication to pursue LP at this point-CT abdom did suggest possible LLL infiltrate - will f/u CXR in AM after hydration   Lactic acidosis Was on ACE I and Lasix as well as statin preadmit- ck  CPK and consider dc ACE and Lasix permanently-pt did report recent leg cramps  h/o CAD No prior ECHO in chart- will ck to see if truly needs ACE and Lasix    Diabetes mellitus Hypoglycemic at presentation and now hyperglycemic-start low-dose Levemir and follow closely-continue SSI-Hemoglobin A1c 7.7 01/18/14  Dehydration/mild Acute renal failure/chronic kidney disease stage II Continue IV fluids-blood pressure remains soft-currently tolerating diet-follow lab-Baseline BUN 18 and creatinine 0.71  Essential hypertension, benign Given soft blood pressure readings will hold metoprolol  Toxic metabolic encephalopathy CT head-non acute - resolving - definitely influence by presenting hypoglycemia-unclear if sepsis or infection contributing  DVT prophylaxis: Lovenox Code Status: FULL Family Communication: sister at bedside Disposition Plan/Expected LOS: SDU  Consultants: none  Procedures: none  Antibiotics: Zosyn 11/10 > Vancomycin 11/10 >  Objective: Blood pressure 97/38, pulse 77, temperature 98.6 F (37 C), temperature source Oral, resp. rate 23, height 4' 11.06" (1.5 m), weight 125 lb (56.7 kg), SpO2 97 %.  Intake/Output Summary (Last 24 hours) at 03/15/14 1306 Last data filed at 03/15/14 0646  Gross per 24 hour  Intake   2150 ml  Output   1550 ml  Net    600 ml   Exam: Gen: No acute respiratory distress Chest: clear to auscultation bilaterally although she did have a few fine expiratory bilateral crackles in the bases primarily on the L, room air Cardiac: Regular rate and rhythm, S1-S2, no rubs murmurs or gallops, no peripheral edema, no JVD Abdomen: Soft nontender nondistended without obvious hepatosplenomegaly, no ascites Extremities: Symmetrical in appearance without cyanosis, clubbing or effusion  Scheduled Meds:  Scheduled Meds: . aspirin EC  81 mg Oral Daily  . bisacodyl  10 mg Rectal Once  . [  START ON 03/16/2014] enoxaparin (LOVENOX) injection  40 mg  Subcutaneous Q24H  . insulin aspart  0-9 Units Subcutaneous TID WC  . insulin glargine  5 Units Subcutaneous Daily  . piperacillin-tazobactam (ZOSYN)  IV  3.375 g Intravenous Q8H  . polyethylene glycol  17 g Oral Daily  . potassium chloride SA  20 mEq Oral Daily  . simvastatin  40 mg Oral q1800  . vancomycin  500 mg Intravenous Q12H   Data Reviewed: Basic Metabolic Panel:  Recent Labs Lab 03/14/14 1320 03/15/14 0330  NA 145 140  K 3.8 4.6  CL 105 102  CO2 20 19  GLUCOSE 172* 389*  BUN 18 21  CREATININE 0.71 0.98  CALCIUM 9.1 7.9*   Liver Function Tests:  Recent Labs Lab 03/14/14 1320 03/15/14 0330  AST 47* 50*  ALT 40* 32  ALKPHOS 71 56  BILITOT 0.6 0.9  PROT 6.7 5.9*  ALBUMIN 3.9 3.2*   CBC:  Recent Labs Lab 03/14/14 1320 03/15/14 0330  WBC 14.9* 13.9*  NEUTROABS 13.6*  --   HGB 13.0 11.9*  HCT 39.7 35.1*  MCV 98.0 96.7  PLT 234 183   CBG:  Recent Labs Lab 03/14/14 1328 03/14/14 1830 03/14/14 2247 03/15/14 0802 03/15/14 1243  GLUCAP 179* 292* 297* 359* 296*    Recent Results (from the past 240 hour(s))  Blood Culture (routine x 2)     Status: None (Preliminary result)   Collection Time: 03/14/14  1:15 PM  Result Value Ref Range Status   Specimen Description BLOOD LEFT ANTECUBITAL  Final   Special Requests BOTTLES DRAWN AEROBIC AND ANAEROBIC 10CC  Final   Culture  Setup Time   Final    03/14/2014 22:07 Performed at [facility]    Culture   Final           BLOOD CULTURE RECEIVED NO GROWTH TO DATE CULTURE WILL BE HELD FOR 5 DAYS BEFORE ISSUING A FINAL NEGATIVE REPORT Performed at [facility]    Report Status PENDING  Incomplete  Blood Culture (routine x 2)     Status: None (Preliminary result)   Collection Time: 03/14/14  1:30 PM  Result Value Ref Range Status   Specimen Description BLOOD LEFT HAND  Final   Special Requests BOTTLES DRAWN AEROBIC AND ANAEROBIC 10CC  Final   Culture  Setup Time   Final    03/14/2014  22:08 Performed at [facility]    Culture   Final           BLOOD CULTURE RECEIVED NO GROWTH TO DATE CULTURE WILL BE HELD FOR 5 DAYS BEFORE ISSUING A FINAL NEGATIVE REPORT Performed at [facility]    Report Status PENDING  Incomplete  MRSA PCR Screening     Status: None   Collection Time: 03/14/14  9:36 PM  Result Value Ref Range Status   MRSA by PCR NEGATIVE NEGATIVE Final    Comment:        The GeneXpert MRSA Assay (FDA approved for NASAL specimens only), is one component of a comprehensive MRSA colonization surveillance program. It is not intended to diagnose MRSA infection nor to guide or monitor treatment for MRSA infections.      Studies:  Recent x-ray studies have been reviewed in detail by the Attending Physician  Time spent : 35 mins  Junious Silkllison Ellis, ANP [facility] Office  [phone] Pager [phone]  On-Call/Text Page:      [email or web address]      password  [email or web address]  If 7PM-7AM, please contact night-coverage [email or web address] Password [email or web address] 03/15/2014, 1:06 PM   LOS: 1 day   I have personally examined this patient and reviewed the entire database. I have reviewed the above note, made any necessary editorial changes, and agree with its content.  Lonia BloodJeffrey T. Jevan Gaunt, MD [facility]

## 2014-03-15 NOTE — Progress Notes (Signed)
  Echocardiogram 2D Echocardiogram has been performed.  Ellen Woods, [name] 03/15/2014, 4:45 PM

## 2014-03-15 NOTE — Progress Notes (Signed)
UR Completed.  Ellen Woods Jane [phone] 03/15/2014  

## 2014-03-15 NOTE — Progress Notes (Signed)
Inpatient Diabetes Program Recommendations  AACE/ADA: New Consensus Statement on Inpatient Glycemic Control (2013)  Target Ranges:  Prepandial:   less than 140 mg/dL      Peak postprandial:   less than 180 mg/dL (1-2 hours)      Critically ill patients:  140 - 180 mg/dL  Results for Ellen Woods, Ellen Woods (MRN [id number]) as of 03/15/2014 08:27  Ref. Range 03/15/2014 03:30  Glucose Latest Range: 70-99 mg/dL 782389 (H)   Results for Ellen Woods, Ellen Woods (MRN [id number]) as of 03/15/2014 08:27  Ref. Range 03/14/2014 12:04 03/14/2014 13:28 03/14/2014 18:30 03/14/2014 22:47  Glucose-Capillary Latest Range: 70-99 mg/dL 578154 (H) 469179 (H) 629292 (H) 297 (H)   Diabetes history: DM Outpatient Diabetes medications: Lantus 15 units QAM, Novolin R 0-15 units once a day Current orders for Inpatient glycemic control: Novolog 0-9 units AC (to start this morning so no insulin has been given since being admitted)  Inpatient Diabetes Program Recommendations Insulin - Basal: Noted patient was hypoglycemic at home and was treated by EMS prior to arriving at the hospital. Glucose is now up to 389 mg/dl. Please consider ordering Lantus 8 units daily to start now (based on 56 kg x 0.15 units).  Thanks, Orlando PennerMarie Kawehi Hostetter, RN, MSN, CCRN, CDE Diabetes Coordinator Inpatient Diabetes Program [phone] (Team Pager) [phone] ([facility] office) [phone] [facility] office)

## 2014-03-16 ENCOUNTER — Inpatient Hospital Stay (HOSPITAL_COMMUNITY): Payer: Medicare HMO

## 2014-03-16 ENCOUNTER — Ambulatory Visit: Payer: Medicare PPO | Admitting: Nurse Practitioner

## 2014-03-16 DIAGNOSIS — R740 Nonspecific elevation of levels of transaminase and lactic acid dehydrogenase [LDH]: Secondary | ICD-10-CM

## 2014-03-16 DIAGNOSIS — K5900 Constipation, unspecified: Secondary | ICD-10-CM

## 2014-03-16 DIAGNOSIS — N189 Chronic kidney disease, unspecified: Secondary | ICD-10-CM | POA: Diagnosis present

## 2014-03-16 DIAGNOSIS — E1165 Type 2 diabetes mellitus with hyperglycemia: Secondary | ICD-10-CM

## 2014-03-16 DIAGNOSIS — G9341 Metabolic encephalopathy: Secondary | ICD-10-CM | POA: Diagnosis present

## 2014-03-16 DIAGNOSIS — I10 Essential (primary) hypertension: Secondary | ICD-10-CM

## 2014-03-16 DIAGNOSIS — J189 Pneumonia, unspecified organism: Secondary | ICD-10-CM | POA: Diagnosis present

## 2014-03-16 DIAGNOSIS — K6289 Other specified diseases of anus and rectum: Secondary | ICD-10-CM

## 2014-03-16 DIAGNOSIS — E872 Acidosis: Secondary | ICD-10-CM

## 2014-03-16 DIAGNOSIS — N179 Acute kidney failure, unspecified: Secondary | ICD-10-CM | POA: Diagnosis present

## 2014-03-16 DIAGNOSIS — R7401 Elevation of levels of liver transaminase levels: Secondary | ICD-10-CM | POA: Diagnosis present

## 2014-03-16 DIAGNOSIS — IMO0002 Reserved for concepts with insufficient information to code with codable children: Secondary | ICD-10-CM | POA: Diagnosis present

## 2014-03-16 LAB — CBC
HCT: 32.2 % — ABNORMAL LOW (ref 36.0–46.0)
Hemoglobin: 10.7 g/dL — ABNORMAL LOW (ref 12.0–15.0)
MCH: 32.3 pg (ref 26.0–34.0)
MCHC: 33.2 g/dL (ref 30.0–36.0)
MCV: 97.3 fL (ref 78.0–100.0)
Platelets: 156 10*3/uL (ref 150–400)
RBC: 3.31 MIL/uL — ABNORMAL LOW (ref 3.87–5.11)
RDW: 12.7 % (ref 11.5–15.5)
WBC: 10.4 10*3/uL (ref 4.0–10.5)

## 2014-03-16 LAB — GLUCOSE, CAPILLARY
Glucose-Capillary: 338 mg/dL — ABNORMAL HIGH (ref 70–99)
Glucose-Capillary: 302 mg/dL — ABNORMAL HIGH (ref 70–99)
Glucose-Capillary: 280 mg/dL — ABNORMAL HIGH (ref 70–99)
Glucose-Capillary: 283 mg/dL — ABNORMAL HIGH (ref 70–99)
Glucose-Capillary: 254 mg/dL — ABNORMAL HIGH (ref 70–99)

## 2014-03-16 LAB — COMPREHENSIVE METABOLIC PANEL
ALT: 39 U/L — AB (ref 0–35)
AST: 56 U/L — ABNORMAL HIGH (ref 0–37)
Albumin: 2.8 g/dL — ABNORMAL LOW (ref 3.5–5.2)
Alkaline Phosphatase: 60 U/L (ref 39–117)
Anion gap: 17 — ABNORMAL HIGH (ref 5–15)
BILIRUBIN TOTAL: 0.9 mg/dL (ref 0.3–1.2)
BUN: 23 mg/dL (ref 6–23)
CHLORIDE: 107 meq/L (ref 96–112)
CO2: 17 meq/L — AB (ref 19–32)
CREATININE: 0.91 mg/dL (ref 0.50–1.10)
Calcium: 7.2 mg/dL — ABNORMAL LOW (ref 8.4–10.5)
GFR calc Af Amer: 71 mL/min — ABNORMAL LOW (ref >90)
GFR calc non Af Amer: 61 mL/min — ABNORMAL LOW (ref >90)
Glucose, Bld: 356 mg/dL — ABNORMAL HIGH (ref 70–99)
Potassium: 4.3 mEq/L (ref 3.7–5.3)
Sodium: 141 mEq/L (ref 137–147)
Total Protein: 5.4 g/dL — ABNORMAL LOW (ref 6.0–8.3)

## 2014-03-16 LAB — CK: Total CK: 704 U/L — ABNORMAL HIGH (ref 7–177)

## 2014-03-16 MED ORDER — BISACODYL 10 MG RE SUPP
10.0000 mg | Freq: Once | RECTAL | Status: AC
  Administered 2014-03-16: 10 mg via RECTAL
  Filled 2014-03-16: qty 1

## 2014-03-16 MED ORDER — DOCUSATE SODIUM 100 MG PO CAPS
100.0000 mg | ORAL_CAPSULE | Freq: Two times a day (BID) | ORAL | Status: DC
  Administered 2014-03-16 – 2014-03-17 (×3): 100 mg via ORAL
  Filled 2014-03-16 (×4): qty 1

## 2014-03-16 MED ORDER — VITAMIN C 500 MG PO TABS
500.0000 mg | ORAL_TABLET | Freq: Every day | ORAL | Status: DC
  Administered 2014-03-16 – 2014-03-17 (×2): 500 mg via ORAL
  Filled 2014-03-16 (×2): qty 1

## 2014-03-16 MED ORDER — INSULIN GLARGINE 100 UNIT/ML ~~LOC~~ SOLN
10.0000 [IU] | Freq: Every day | SUBCUTANEOUS | Status: DC
  Administered 2014-03-16 – 2014-03-17 (×2): 10 [IU] via SUBCUTANEOUS
  Filled 2014-03-16 (×2): qty 0.1

## 2014-03-16 MED ORDER — LEVOFLOXACIN IN D5W 500 MG/100ML IV SOLN
500.0000 mg | INTRAVENOUS | Status: DC
Start: 2014-03-16 — End: 2014-03-17
  Administered 2014-03-16: 500 mg via INTRAVENOUS
  Filled 2014-03-16 (×2): qty 100

## 2014-03-16 MED ORDER — MECLIZINE HCL 25 MG PO TABS
25.0000 mg | ORAL_TABLET | Freq: Three times a day (TID) | ORAL | Status: DC | PRN
  Filled 2014-03-16: qty 1

## 2014-03-16 MED ORDER — FLEET ENEMA 7-19 GM/118ML RE ENEM
1.0000 | ENEMA | Freq: Once | RECTAL | Status: AC
  Administered 2014-03-16: 1 via RECTAL
  Filled 2014-03-16: qty 1

## 2014-03-16 MED ORDER — FERROUS SULFATE 325 (65 FE) MG PO TABS
325.0000 mg | ORAL_TABLET | Freq: Two times a day (BID) | ORAL | Status: DC
  Administered 2014-03-16 – 2014-03-17 (×2): 325 mg via ORAL
  Filled 2014-03-16 (×4): qty 1

## 2014-03-16 MED ORDER — VITAMIN B-12 1000 MCG PO TABS
1000.0000 ug | ORAL_TABLET | Freq: Every day | ORAL | Status: DC
  Administered 2014-03-16 – 2014-03-17 (×2): 1000 ug via ORAL
  Filled 2014-03-16 (×2): qty 1

## 2014-03-16 MED ORDER — VITAMIN B-2 100 MG PO TABS
100.0000 mg | ORAL_TABLET | Freq: Every day | ORAL | Status: DC
  Administered 2014-03-16 – 2014-03-17 (×2): 100 mg via ORAL
  Filled 2014-03-16 (×3): qty 1

## 2014-03-16 MED ORDER — PRENATAL MULTIVITAMIN CH
1.0000 | ORAL_TABLET | Freq: Every day | ORAL | Status: DC
  Administered 2014-03-16 – 2014-03-17 (×2): 1 via ORAL
  Filled 2014-03-16 (×2): qty 1

## 2014-03-16 MED ORDER — LORATADINE 10 MG PO TABS
10.0000 mg | ORAL_TABLET | Freq: Every day | ORAL | Status: DC
  Administered 2014-03-16 – 2014-03-17 (×2): 10 mg via ORAL
  Filled 2014-03-16 (×3): qty 1

## 2014-03-16 MED ORDER — FLUTICASONE PROPIONATE 50 MCG/ACT NA SUSP: 1.0000 | Freq: Every day | NASAL | Status: DC | PRN

## 2014-03-16 MED ORDER — ALPRAZOLAM 0.5 MG PO TABS
0.5000 mg | ORAL_TABLET | Freq: Two times a day (BID) | ORAL | Status: DC | PRN
  Administered 2014-03-16: 0.5 mg via ORAL
  Filled 2014-03-16: qty 1

## 2014-03-16 MED ORDER — ADULT MULTIVITAMIN W/MINERALS CH
1.0000 | ORAL_TABLET | Freq: Every day | ORAL | Status: DC
  Filled 2014-03-16: qty 1

## 2014-03-16 NOTE — Progress Notes (Signed)
Admission note:  Arrival Method: Wheel[facility] from [facility] Mental Orientation:Alert to self and place Assessment: See doc flowsheet Skin: Dry; intact IV: R wrist/ R forearm Pain: Denies Safety Measures: High fall risk interventions in place  6700 Orientation: Patient has been oriented to the unit, staff and to the room.

## 2014-03-16 NOTE — Evaluation (Signed)
Physical Therapy Evaluation Patient Details Name: Ellen [name] MRN: [id number] DOB: 08/31/1940 Today's Date: 03/16/2014   History of Present Illness  Patient is [name] 73 y/o female with PMH of DM on Insulin, HTN and CAD who was brought to the ER by her daughter after she found her poorly responsive and diaphoretic on 11/10. Pt found to be hypoglycemic with CBG in 30s which improved to 130s with Dextrose per EMS, despite this remians confused and hence brought to the ER. In ER, pt noted to be hypothermic with leukocytosis, elevated lactic acid, CXR/UA unremarkable, CT abd with fecal impaction and mild wall thickening in rectum ? proctitis. Chest XRAY-Worsened left basilar airspace disease worrisome for pneumonia.  Clinical Impression  Patient presents with functional limitations due to deficits listed in PT problem list (see below). Pt with generalized weakness and balance deficits impacting safe mobility. Pt with 1 major LOB during gait requiring Mod [name] to prevent fall. Impaired safety awareness. Recommend hands on assist for ambulation for safety and use of RW for support. Pt would benefit from acute PT to improve transfers, gait, balance and overall mobility so pt can maximize independence and decrease fall risk prior to return home.    Follow Up Recommendations Home health PT;Supervision/Assistance - 24 hour    Equipment Recommendations  None recommended by PT    Recommendations for Other Services       Precautions / Restrictions Precautions Precautions: Fall Restrictions Weight Bearing Restrictions: No      Mobility  Bed Mobility Overal bed mobility: Needs Assistance Bed Mobility: Supine to Sit;Sit to Supine     Supine to sit: Supervision;HOB elevated Sit to supine: Supervision;HOB elevated   General bed mobility comments: Use of rails for support. Supervision for safety due to first time pt being up.  Transfers Overall transfer level: Needs assistance Equipment used:  None Transfers: Sit to/from Stand Sit to Stand: Min guard         General transfer comment: Min guard for steadying/balance. Unsteady upon standing, "I didn't know how weak I was." transferred off toilet (after being told by therapis tto ask for assist).  Ambulation/Gait Ambulation/Gait assistance: Min assist Ambulation Distance (Feet): 16 Feet (x2 bouts) Assistive device: None Gait Pattern/deviations: Decreased stride length;Narrow base of support;Step-through pattern;Staggering left;Staggering right     General Gait Details: Pt very unsteady during gait with LOB anteriorly catching self on sink counter and Mod [name] from therapist to prevent fall. Reaching to hold onto IV pole and furniture within room for support. POor safety awareness with IV.  Stairs            Wheelchair Mobility    Modified Rankin (Stroke Patients Only)       Balance Overall balance assessment: Needs assistance Sitting-balance support: Feet supported;No upper extremity supported Sitting balance-Leahy Scale: Fair     Standing balance support: During functional activity Standing balance-Leahy Scale: Poor Standing balance comment: Able to perform dynamic standing washing hands at sink with UE support. Unsteady reaching outside BoS for soap and paper towels. Reaching for furniture within room for balance during gait.                             Pertinent Vitals/Pain Pain Assessment: No/denies pain    Home Living Family/patient expects to be discharged to:: Private residence Living Arrangements: Children Available Help at Discharge: Family;Available 24 hours/day Type of Home: Mobile home Home Access: Stairs to enter Entrance Stairs-Rails: Right;Left  Entrance Stairs-Number of Steps: 5 Home Layout: One level Home Equipment: Cane - single point;Walker - 2 wheels      Prior Function Level of Independence: Independent;Needs assistance   Gait / Transfers Assistance Needed: Pt reports  (I) household ambulator and [facility].  ADL's / Homemaking Assistance Needed: (I) for dressing/bathing but daughter does cooking/cleaning.        Hand Dominance   Dominant Hand: Right    Extremity/Trunk Assessment   Upper Extremity Assessment: Generalized weakness           Lower Extremity Assessment: Generalized weakness         Communication   Communication: No difficulties  Cognition Arousal/Alertness: Awake/alert Behavior During Therapy: WFL for tasks assessed/performed Overall Cognitive Status: Within Functional Limits for tasks assessed                      General Comments      Exercises        Assessment/Plan    PT Assessment Patient needs continued PT services  PT Diagnosis Generalized weakness;Difficulty walking   PT Problem List Decreased strength;Decreased activity tolerance;Decreased balance;Decreased safety awareness;Decreased mobility  PT Treatment Interventions Balance training;Gait training;DME instruction;Stair training;Patient/family education;Functional mobility training;Therapeutic activities;Therapeutic exercise   PT Goals (Current goals can be found in the Care Plan section) Acute Rehab PT Goals Patient Stated Goal: to get out of this bed PT Goal Formulation: With patient Time For Goal Achievement: 03/30/14 Potential to Achieve Goals: Good    Frequency Min 3X/week   Barriers to discharge        Co-evaluation               End of Session Equipment Utilized During Treatment: Gait belt Activity Tolerance: Patient limited by fatigue Patient left: in bed;with call bell/phone within reach;with bed alarm set;with nursing/sitter in room Nurse Communication: Mobility status;Precautions         Time: 1351-1410 PT Time Calculation (min) (ACUTE ONLY): 19 min   Charges:   PT Evaluation $Initial PT Evaluation Tier I: 1 Procedure PT Treatments $Therapeutic Activity: 8-22 mins   PT G CodesAlvie Heidelberg:           Folan, Ellen [name] 03/16/2014, 2:23 PM  Alvie HeidelbergShauna Folan, PT, DPT [phone]

## 2014-03-16 NOTE — Progress Notes (Signed)
Called telemetry to notify pt gone to 6 east and tele d/c'd.

## 2014-03-16 NOTE — Evaluation (Signed)
Occupational Therapy Evaluation Patient Details Name: Ellen HaverGlenda J [name] MRN: [id number] DOB: 02/26/1941 Today's Date: 03/16/2014    History of Present Illness Patient is a 73 y/o female with PMH of DM on Insulin, HTN and CAD who was brought to the ER by her daughter after she found her poorly responsive and diaphoretic on 11/10. Pt found to be hypoglycemic with CBG in 30s which improved to 130s with Dextrose per EMS, despite this remians confused and hence brought to the ER. In ER, pt noted to be hypothermic with leukocytosis, elevated lactic acid, CXR/UA unremarkable, CT abd with fecal impaction and mild wall thickening in rectum ? proctitis. Chest XRAY-Worsened left basilar airspace disease worrisome for pneumonia.   Clinical Impression   Pt presents with decreased awareness of safety and deficits with decreased balance and generalized weakness.  Prior to admission, pt performed self care at a modified independent to independent level.  Unclear what pt's baseline is in cognition, no family to report.  Will follow acutely.  Pt should be able to return home with close supervision of her daughter upon d/c.    Follow Up Recommendations  No OT follow up;Supervision/Assistance - 24 hour    Equipment Recommendations  None recommended by OT    Recommendations for Other Services       Precautions / Restrictions Precautions Precautions: Fall Restrictions Weight Bearing Restrictions: No      Mobility Bed Mobility Overal bed mobility: Needs Assistance Bed Mobility: Supine to Sit;Sit to Supine     Supine to sit: Supervision;HOB elevated Sit to supine: Supervision;HOB elevated   General bed mobility comments: no physical assist needed  Transfers Overall transfer level: Needs assistance Equipment used: Rolling walker (2 wheeled) Transfers: Sit to/from Stand Sit to Stand: Supervision         General transfer comment: verbal cues for hand placement and to guide walker initially.     Balance Overall balance assessment: Needs assistance Sitting-balance support: Feet supported;No upper extremity supported Sitting balance-Leahy Scale: Fair     Standing balance support: During functional activity Standing balance-Leahy Scale: Poor Standing balance comment: Able to perform dynamic standing washing hands at sink with UE support.                            ADL Overall ADL's : Needs assistance/impaired Eating/Feeding: Independent;Sitting   Grooming: Wash/dry hands;Supervision/safety;Standing   Upper Body Bathing: Set up;Sitting   Lower Body Bathing: Supervison/ safety;Sit to/from stand   Upper Body Dressing : Set up;Sitting   Lower Body Dressing: Supervision/safety;Sit to/from stand   Toilet Transfer: Retail bankerupervision/safety;Regular Toilet;Ambulation;RW   Toileting- Clothing Manipulation and Hygiene: Minimal assistance;Sit to/from stand       Functional mobility during ADLs: Supervision/safety;Rolling walker       Vision                     Perception     Praxis      Pertinent Vitals/Pain Pain Assessment: No/denies pain     Hand Dominance Right   Extremity/Trunk Assessment Upper Extremity Assessment Upper Extremity Assessment: Overall WFL for tasks assessed   Lower Extremity Assessment Lower Extremity Assessment: Defer to PT evaluation   Cervical / Trunk Assessment Cervical / Trunk Assessment: Normal   Communication Communication Communication: No difficulties   Cognition Arousal/Alertness: Awake/alert Behavior During Therapy: WFL for tasks assessed/performed Overall Cognitive Status: No family/caregiver present to determine baseline cognitive functioning  General Comments       Exercises       Shoulder Instructions      Home Living Family/patient expects to be discharged to:: Private residence Living Arrangements: Children Available Help at Discharge: Family;Available 24  hours/day Type of Home: Mobile home Home Access: Stairs to enter Entrance Stairs-Number of Steps: 5 Entrance Stairs-Rails: Right;Left Home Layout: One level     Bathroom Shower/Tub: Chief Strategy OfficerTub/shower unit   Bathroom Toilet: Standard     Home Equipment: Cane - single point;Walker - 2 wheels;Shower seat          Prior Functioning/Environment Level of Independence: Needs assistance  Gait / Transfers Assistance Needed: Pt reports (I) household ambulator and [facility]. ADL's / Homemaking Assistance Needed: (I) for dressing/bathing but daughter does cooking/cleaning.        OT Diagnosis: Generalized weakness;Cognitive deficits   OT Problem List: Decreased activity tolerance;Impaired balance (sitting and/or standing);Decreased cognition;Decreased safety awareness;Decreased knowledge of use of DME or AE   OT Treatment/Interventions: Self-care/ADL training;DME and/or AE instruction;Patient/family education;Balance training    OT Goals(Current goals can be found in the care plan section) Acute Rehab OT Goals Patient Stated Goal: to have a bowel movement OT Goal Formulation: With patient Time For Goal Achievement: 03/30/14 Potential to Achieve Goals: Good ADL Goals Pt Will Perform Grooming: with modified independence;standing Pt Will Transfer to Toilet: with modified independence;ambulating;regular height toilet Pt Will Perform Toileting - Clothing Manipulation and hygiene: with modified independence;sit to/from stand Pt Will Perform Tub/Shower Transfer: Tub transfer;ambulating;shower seat;rolling walker  OT Frequency: Min 2X/week   Barriers to D/C:            Co-evaluation              End of Session Equipment Utilized During Treatment: Engineer, waterolling walker Nurse Communication:  (very scant BM, told unit secretary phone was broken)  Activity Tolerance: Patient tolerated treatment well Patient left: in bed;with call bell/phone within reach;with bed alarm  set   Time: [id number] OT Time Calculation (min): 48 min Charges:  OT General Charges $OT Visit: 1 Procedure OT Evaluation $Initial OT Evaluation Tier I: 1 Procedure OT Treatments $Self Care/Home Management : 23-37 mins G-Codes:    Evern BioMayberry, Letishia Elliott Lynn 03/16/2014, 3:50 PM  [phone]

## 2014-03-16 NOTE — Plan of Care (Signed)
Problem: Consults Goal: Diabetes Guidelines if Diabetic/Glucose > 140 If diabetic or lab glucose is > 140 mg/dl - Initiate Diabetes/Hyperglycemia Guidelines & Document Interventions  Outcome: Completed/Met Date Met:  03/16/14

## 2014-03-16 NOTE — Progress Notes (Signed)
Pt has orders to go to floor 6 east rm 13. Called and gave report to 6 east nurse. Pt is alert and oriented to self and place. Pt's foley cath removed and reported to 6 east nurse. Pt's family here and aware that she is moving.

## 2014-03-16 NOTE — Plan of Care (Signed)
Problem: Phase II Progression Outcomes Goal: Obtain order to discontinue catheter if appropriate Outcome: Completed/Met Date Met:  03/16/14

## 2014-03-16 NOTE — Progress Notes (Signed)
Inpatient Diabetes Program Recommendations  AACE/ADA: New Consensus Statement on Inpatient Glycemic Control (2013)  Target Ranges:  Prepandial:   less than 140 mg/dL      Peak postprandial:   less than 180 mg/dL (1-2 hours)      Critically ill patients:  140 - 180 mg/dL  Results for Ellen [name], Ellen [name] (MRN [id number]) as of 03/16/2014 08:04  Ref. Range 03/15/2014 03:30 03/16/2014 03:24  Glucose Latest Range: 70-99 mg/dL 409389 (H) 811356 (H)   Results for Ellen [name], Ellen [name] (MRN [id number]) as of 03/16/2014 08:04  Ref. Range 03/15/2014 08:02 03/15/2014 12:43 03/15/2014 16:42  Glucose-Capillary Latest Range: 70-99 mg/dL 213359 (H) 086296 (H) 578243 (H)   Diabetes history: DM Outpatient Diabetes medications: Lantus 15 units QAM, Novolin R 0-15 units once a day Current orders for Inpatient glycemic control: Lantus 5 units daily, Novolog 0-9 units AC   Inpatient Diabetes Program Recommendations Insulin - Basal: Please consider increasing Lantus to 12 units daily. Correction (SSI): Please consider ordering Novolog bedtime correction scale.  Thanks, Orlando PennerMarie Jatziry Wechter, RN, MSN, CCRN, CDE Diabetes Coordinator Inpatient Diabetes Program [phone] (Team Pager) [phone] ([facility] office) [phone] [facility] office)

## 2014-03-16 NOTE — Plan of Care (Signed)
Problem: Phase I Progression Outcomes Goal: OOB as tolerated unless otherwise ordered Outcome: Completed/Met Date Met:  03/16/14     

## 2014-03-16 NOTE — Progress Notes (Signed)
[facility]  Melody HaverGlenda J Vasil JJO:[id number] DOB: 04/25/1941 DOA: 03/14/2014 PCP: Rudi HeapMOORE, DONALD, MD  Brief narrative: 73 year old female patient with history of diabetes on insulin, hypertension, chronic constipation and CAD. She lives with her daughter. She was brought to the ER after being found poorly responsive and diaphoretic. She was in her usual state of health until 24 hours prior to presentation. Because of her symptoms her daughter called EMS. They checked her CBG and she was hypoglycemic with a reading of 30. She was given dextrose and her CBG improved to 130. She remained confused so she was brought to the ER.  In the ER she was noted to be hypothermic with leukocytosis, elevated lactic acid. Chest x-ray and urinalysis were unremarkable. CT the abdomen and pelvis revealed fecal impaction and mild wall thickening in the rectum consistent with probable proctitis.  HPI/Subjective: Patient now alert and without complaints  Assessment/Plan:  Proctitis/Constipation -Currently not complaining of rectal discomfort-likely poor intake leading to dehydration leading to her admitting symptoms -Dulcolax supp x 1 11/12 and Fleet enema if no response to Dulcolax supp -resume Colace BID -was on iron preadmit  SIRS - LLL CAP -Developed fever up to 104 after admission-mentation has improved-had marked lactic acidosis and hypoglycemia at presentation both of which have improved or resolved --patient denied cough and currently did not have respiratory symptoms but as precaution given high fever will check influenza PCR -neurologically has improved and does not have any obvious meningeal signs and no indication to pursue LP at this point -CT abdom did suggest possible LLL infiltrate and  FU CXR 11/12 after hydration also concerning for same so will narrow anbxs to Levaquin (has Zmax allergy)-stable on RA  Lactic acidosis -Was on ACE I and Lasix as well as  statin preadmit - CPK checked after hydration and was elevated at 811 so will dc statin (Zocor); CPK has decreased to 704 as of 11/12  -pt did report recent leg cramps -cont to hold diuretics and ACE I  Transaminitis -Mild elevation of LFTs and possibly due to statin- follow - given recent fever as precaution will ck Hepatitis panel  h/o CAD -ECHO with preserved LV function and grade 1 diastolic dysfunction - no indications for chronic Lasix although with DD ACE I beneficial if BP can tolerate    Diabetes mellitus -Hypoglycemic at presentation and now hyperglycemic -cont Levemir but increase dose and follow closely -continue SSI -Hemoglobin A1c 7.7 on 01/18/14  Dehydration/mild Acute renal failure/chronic kidney disease stage II -Continue IV fluids but decrease rate since tolerating diet-blood pressure remains soft -currently tolerating diet -follow lab-Baseline BUN 18 and creatinine 0.71  Essential hypertension, benign -Given soft blood pressure readings will hold metoprolol and ACEI (as above)  Toxic metabolic encephalopathy -CT head-non acute - resolved  - definitely influenced by presenting hypoglycemia -unclear if sepsis or infection contributing -on chronic BZDs so resumed 11/12 to prevent withdrawal  DVT prophylaxis: Lovenox Code Status: FULL Family Communication: DAUGHTER at bedside Disposition Plan/Expected LOS: Transfer to floor  Consultants: none  Procedures: none  Antibiotics: Zosyn 11/10 >11/12 Vancomycin 11/10 >11/12 Levaquin 11/12 >  Objective: Blood pressure 125/83, pulse 82, temperature 98.7 F (37.1 C), temperature source Oral, resp. rate 20, height 4' 11.06" (1.5 m), weight 125 lb (56.7 kg), SpO2 98 %.  Intake/Output Summary (Last 24 hours) at 03/16/14 1313 Last data filed at 03/16/14 1200  Gross per 24 hour  Intake   1550 ml  Output    880 ml  Net    670 ml   Exam: Gen: No acute respiratory distress Chest: clear to auscultation  bilaterally , fine expiratory crackles  L base, room air Cardiac: Regular rate and rhythm, S1-S2, no rubs murmurs or gallops, no peripheral edema, no JVD Abdomen: Soft nontender non distended, tolerating diet- no BM yet- denies rectal pain or drainage Extremities: Symmetrical in appearance without cyanosis, clubbing or effusion  Scheduled Meds:  Scheduled Meds: . aspirin EC  81 mg Oral Daily  . bisacodyl  10 mg Rectal Once  . docusate sodium  100 mg Oral BID  . enoxaparin (LOVENOX) injection  40 mg Subcutaneous Q24H  . insulin aspart  0-9 Units Subcutaneous TID WC  . insulin glargine  10 Units Subcutaneous Daily  . polyethylene glycol  17 g Oral Daily  . potassium chloride SA  20 mEq Oral Daily  . sodium phosphate  1 enema Rectal Once   Data Reviewed: Basic Metabolic Panel:  Recent Labs Lab 03/14/14 1320 03/15/14 0330 03/16/14 0324  NA 145 140 141  K 3.8 4.6 4.3  CL 105 102 107  CO2 20 19 17*  GLUCOSE 172* 389* 356*  BUN 18 21 23   CREATININE 0.71 0.98 0.91  CALCIUM 9.1 7.9* 7.2*   Liver Function Tests:  Recent Labs Lab 03/14/14 1320 03/15/14 0330 03/16/14 0324  AST 47* 50* 56*  ALT 40* 32 39*  ALKPHOS 71 56 60  BILITOT 0.6 0.9 0.9  PROT 6.7 5.9* 5.4*  ALBUMIN 3.9 3.2* 2.8*   CBC:  Recent Labs Lab 03/14/14 1320 03/15/14 0330 03/16/14 0324  WBC 14.9* 13.9* 10.4  NEUTROABS 13.6*  --   --   HGB 13.0 11.9* 10.7*  HCT 39.7 35.1* 32.2*  MCV 98.0 96.7 97.3  PLT 234 183 156   CBG:  Recent Labs Lab 03/15/14 0802 03/15/14 1243 03/15/14 1642 03/16/14 0853 03/16/14 1214  GLUCAP 359* 296* 243* 338* 280*    Recent Results (from the past 240 hour(s))  Urine culture     Status: None   Collection Time: 03/14/14 12:27 PM  Result Value Ref Range Status   Specimen Description URINE, CATHETERIZED  Final   Special Requests NONE  Final   Culture  Setup Time   Final    03/14/2014 16:57 Performed at [facility]    Colony Count NO GROWTH Performed at  [facility]   Final   Culture NO GROWTH Performed at [facility]   Final   Report Status 03/15/2014 FINAL  Final  Blood Culture (routine x 2)     Status: None (Preliminary result)   Collection Time: 03/14/14  1:15 PM  Result Value Ref Range Status   Specimen Description BLOOD LEFT ANTECUBITAL  Final   Special Requests BOTTLES DRAWN AEROBIC AND ANAEROBIC 10CC  Final   Culture  Setup Time   Final    03/14/2014 22:07 Performed at [facility]    Culture   Final           BLOOD CULTURE RECEIVED NO GROWTH TO DATE CULTURE WILL BE HELD FOR 5 DAYS BEFORE ISSUING A FINAL NEGATIVE REPORT Performed at [facility]    Report Status PENDING  Incomplete  Blood Culture (routine x 2)     Status: None (Preliminary result)   Collection Time: 03/14/14  1:30 PM  Result Value Ref Range Status   Specimen Description BLOOD LEFT HAND  Final   Special Requests BOTTLES  DRAWN AEROBIC AND ANAEROBIC 10CC  Final   Culture  Setup Time   Final    03/14/2014 22:08 Performed at [facility]    Culture   Final           BLOOD CULTURE RECEIVED NO GROWTH TO DATE CULTURE WILL BE HELD FOR 5 DAYS BEFORE ISSUING A FINAL NEGATIVE REPORT Performed at [facility]    Report Status PENDING  Incomplete  MRSA PCR Screening     Status: None   Collection Time: 03/14/14  9:36 PM  Result Value Ref Range Status   MRSA by PCR NEGATIVE NEGATIVE Final    Comment:        The GeneXpert MRSA Assay (FDA approved for NASAL specimens only), is one component of a comprehensive MRSA colonization surveillance program. It is not intended to diagnose MRSA infection nor to guide or monitor treatment for MRSA infections.      Studies:  Recent x-ray studies have been reviewed in detail by the Attending Physician  Time spent : 40 mins  Junious Silk, ANP [facility] Office  [phone] Pager [phone]  On-Call/Text Page:      [email or web address]      password [email or web address]  If  7PM-7AM, please contact night-coverage [email or web address] Password [email or web address] 03/16/2014, 1:13 PM   LOS: 2 days   Examined patient and discussed assessment and plan with ANP Revonda Standard and agree with above plan. Patient with multiple complex medical problems> 40 minutes spent in direct patient care

## 2014-03-16 NOTE — Plan of Care (Signed)
Problem: Phase I Progression Outcomes Goal: Pain controlled with appropriate interventions Outcome: Not Applicable Date Met:  88/26/66 Goal: OOB as tolerated unless otherwise ordered Outcome: Progressing Goal: Initial discharge plan identified Outcome: Progressing Goal: Voiding-avoid urinary catheter unless indicated Outcome: Progressing Goal: Hemodynamically stable Outcome: Completed/Met Date Met:  03/16/14

## 2014-03-17 DIAGNOSIS — I5032 Chronic diastolic (congestive) heart failure: Secondary | ICD-10-CM

## 2014-03-17 LAB — COMPREHENSIVE METABOLIC PANEL
ALT: 33 U/L (ref 0–35)
AST: 37 U/L (ref 0–37)
Albumin: 2.5 g/dL — ABNORMAL LOW (ref 3.5–5.2)
Alkaline Phosphatase: 58 U/L (ref 39–117)
Anion gap: 12 (ref 5–15)
BUN: 16 mg/dL (ref 6–23)
CHLORIDE: 112 meq/L (ref 96–112)
CO2: 21 mEq/L (ref 19–32)
Calcium: 7.3 mg/dL — ABNORMAL LOW (ref 8.4–10.5)
Creatinine, Ser: 0.77 mg/dL (ref 0.50–1.10)
GFR calc non Af Amer: 81 mL/min — ABNORMAL LOW (ref >90)
GLUCOSE: 134 mg/dL — AB (ref 70–99)
POTASSIUM: 3.8 meq/L (ref 3.7–5.3)
Sodium: 145 mEq/L (ref 137–147)
Total Bilirubin: 0.7 mg/dL (ref 0.3–1.2)
Total Protein: 5.1 g/dL — ABNORMAL LOW (ref 6.0–8.3)

## 2014-03-17 LAB — CBC
HCT: 31 % — ABNORMAL LOW (ref 36.0–46.0)
Hemoglobin: 10.5 g/dL — ABNORMAL LOW (ref 12.0–15.0)
MCH: 32.4 pg (ref 26.0–34.0)
MCHC: 33.9 g/dL (ref 30.0–36.0)
MCV: 95.7 fL (ref 78.0–100.0)
Platelets: 155 10*3/uL (ref 150–400)
RBC: 3.24 MIL/uL — ABNORMAL LOW (ref 3.87–5.11)
RDW: 12.5 % (ref 11.5–15.5)
WBC: 6.4 10*3/uL (ref 4.0–10.5)

## 2014-03-17 LAB — HEPATITIS PANEL, ACUTE
HCV Ab: NEGATIVE
HEP A IGM: NONREACTIVE
Hep B C IgM: NONREACTIVE
Hepatitis B Surface Ag: NEGATIVE

## 2014-03-17 LAB — GLUCOSE, CAPILLARY
Glucose-Capillary: 125 mg/dL — ABNORMAL HIGH (ref 70–99)
Glucose-Capillary: 162 mg/dL — ABNORMAL HIGH (ref 70–99)

## 2014-03-17 LAB — CK: Total CK: 396 U/L — ABNORMAL HIGH (ref 7–177)

## 2014-03-17 MED ORDER — ALPRAZOLAM 0.5 MG PO TABS: 0.5000 mg | ORAL_TABLET | Freq: Two times a day (BID) | ORAL | Status: DC | PRN

## 2014-03-17 MED ORDER — DSS 100 MG PO CAPS: 100.0000 mg | ORAL_CAPSULE | Freq: Two times a day (BID) | ORAL | Status: DC

## 2014-03-17 MED ORDER — POLYETHYLENE GLYCOL 3350 17 G PO PACK: 17.0000 g | PACK | ORAL | Status: DC

## 2014-03-17 MED ORDER — LEVOFLOXACIN 500 MG PO TABS: 500.0000 mg | ORAL_TABLET | Freq: Every day | ORAL | Status: AC

## 2014-03-17 NOTE — Progress Notes (Signed)
Patient Discharge:  Disposition: Discharged home with Daughter  Education: Pt and family educated on medications, follow up appointment and discharge instructions. Pt and family verbalized understanding  IV: Removed  Telemetry: N/A  Follow-up appointments:Reviewed with pt and family  Prescriptions: Scripts given to pt  Transportation: Family transported   Belongings:All belongings taken with pt

## 2014-03-17 NOTE — Progress Notes (Signed)
Occupational Therapy Treatment Patient Details Name: Ellen HaverGlenda J Woods MRN: [id number] DOB: 03/01/1941 Today's Date: 03/17/2014    History of present illness Patient is a 73 y/o female with PMH of DM on Insulin, HTN and CAD who was brought to the ER by her daughter after she found her poorly responsive and diaphoretic on 11/10. Pt found to be hypoglycemic with CBG in 30s which improved to 130s with Dextrose per EMS, despite this remians confused and hence brought to the ER. In ER, pt noted to be hypothermic with leukocytosis, elevated lactic acid, CXR/UA unremarkable, CT abd with fecal impaction and mild wall thickening in rectum ? proctitis. Chest XRAY-Worsened left basilar airspace disease worrisome for pneumonia.   OT comments  Progressing steadily in ADL and ADL transfers.  Increased tolerance of standing activities and ability to manage walker safely.  Pt eager to go home.  Follow Up Recommendations  No OT follow up;Supervision/Assistance - 24 hour    Equipment Recommendations  None recommended by OT    Recommendations for Other Services      Precautions / Restrictions Precautions Precautions: Fall Restrictions Weight Bearing Restrictions: No       Mobility Bed Mobility  Pt up in chair.        Transfers Overall transfer level: Needs assistance Equipment used: Rolling walker (2 wheeled) Transfers: Sit to/from Stand Sit to Stand: Supervision         General transfer comment: verbal cues for safety/hand placement    Balance   Sitting-balance support: No upper extremity supported;Feet supported Sitting balance-Leahy Scale: Good     Standing balance support: Bilateral upper extremity supported;During functional activity Standing balance-Leahy Scale: Fair                     ADL Overall ADL's : Needs assistance/impaired     Grooming: Wash/dry hands;Wash/dry face;Oral care;Brushing hair;Supervision/safety;Standing                   Toilet  Transfer: Retail bankerupervision/safety;Regular Toilet;Ambulation;RW   Toileting- Clothing Manipulation and Hygiene: Supervision/safety;Sit to/from stand                Vision                     Perception     Praxis      Cognition   Behavior During Therapy: [facility] for tasks assessed/performed Overall Cognitive Status: No family/caregiver present to determine baseline cognitive functioning                       Extremity/Trunk Assessment               Exercises     Shoulder Instructions       General Comments      Pertinent Vitals/ Pain       Pain Assessment: No/denies pain  Home Living                                          Prior Functioning/Environment              Frequency Min 2X/week     Progress Toward Goals  OT Goals(current goals can now be found in the care plan section)  Progress towards OT goals: Progressing toward goals  Acute Rehab OT Goals Time For Goal Achievement: 03/30/14 Potential to Achieve Goals: Good  Plan Discharge plan remains appropriate    Co-evaluation                 End of Session Equipment Utilized During Treatment: Rolling walker   Activity Tolerance Patient tolerated treatment well   Patient Left in chair;with call bell/phone within reach   Nurse Communication          Time: [id number] OT Time Calculation (min): 19 min  Charges: OT General Charges $OT Visit: 1 Procedure OT Treatments $Self Care/Home Management : 8-22 mins  Evern BioMayberry, [place] Tursi Lynn 03/17/2014, 11:45 AM [phone]

## 2014-03-17 NOTE — Plan of Care (Signed)
Problem: Phase III Progression Outcomes Goal: Pain controlled on oral analgesia Outcome: Completed/Met Date Met:  03/17/14 Pt denies any pain or discomfort. Will continue to monitor.

## 2014-03-17 NOTE — Care Management Note (Signed)
CARE MANAGEMENT NOTE 03/17/2014  Patient:  [name],Leanny J   Account Number:  [id number]  Date Initiated:  03/15/2014  Documentation initiated by:  [facility] LLCBROWN,SARAH  Subjective/Objective Assessment:   Admitted with AMS - SIRS.     Action/Plan:   CM following for progression and d/c planning   Anticipated DC Date:  03/20/2014   Anticipated DC Plan:  HOME W HOME HEALTH SERVICES      DC Planning Services  CM consult      Choice offered to / List presented to:             Status of service:  In process, will continue to follow Medicare Important Message given?  YES (If response is "NO", the following Medicare IM given date fields will be blank) Date Medicare IM given:  03/17/2014 Medicare IM given by:  Maciah Schweigert Date Additional Medicare IM given:   Additional Medicare IM given by:    Discharge Disposition:    Per UR Regulation:  Reviewed for med. necessity/level of care/duration of stay  If discussed at Long Length of Stay Meetings, dates discussed:    Comments:  Contact:  Daughter Geannie Risen- [name] Mclaren @ [phone] or [phone]

## 2014-03-17 NOTE — Discharge Instructions (Signed)

## 2014-03-17 NOTE — Discharge Summary (Signed)
Discharge Summary  Ellen Woods EHU:[id number] DOB: Apr 24, 1941  PCP: Redge Gainer, MD  Admit date: 03/14/2014 Discharge date: 03/17/2014  Time spent: 25 minutes  Recommendations for Outpatient Follow-up:  1. New medication:Levaquin 500 milligrams by mouth daily 4 days  2. New medication: Colace 100 mg by mouth twice a day 3. New medication: Mira lax 17 g taken every other day 4. Patient will follow up with her primary care physician in the next 2 weeks 5. Medication change: Statin on hold until follow-up by PCP  Discharge Diagnoses:  Active Hospital Problems   Diagnosis Date Noted  . SIRS (systemic inflammatory response syndrome) 03/14/2014  . CAP (community acquired pneumonia)   . Lactic acidosis   . Transaminitis   . Diabetes type 2, uncontrolled   . Acute on chronic renal failure   . Metabolic encephalopathy   . Dehydration 03/15/2014  . Constipation 03/15/2014  . Acute renal failure 03/15/2014  . Proctitis 03/14/2014  . Chronic diastolic heart failure 78/58/8502  . Toxic metabolic encephalopathy 77/41/2878  . Essential hypertension, benign 03/18/2010    Resolved Hospital Problems   Diagnosis Date Noted Date Resolved  No resolved problems to display.    Discharge Condition: improved, being discharged to home  Diet recommendation: heart healthy  Filed Weights   03/14/14 1944 03/14/14 2030 03/16/14 2103  Weight: 56.7 kg (125 lb) 56.7 kg (125 lb) 57.335 kg (126 lb 6.4 oz)    History of present illness:  73 year old with past history diabetes, hypertension and chronic constipation plus chronic diastolic heart failure brought into the emergency room on 11/10 for decreased level of responsiveness and initially found to be hypoglycemic. Emergency room, patient noted to be hypothermic with leukocytosis and elevated lactic acid level. Chest x-ray and urinalysis were unrevealing, but CT of abdomen and pelvis noted fecal impaction and mild wall thickening in the rectum  consistent with probable proctitis.  Following admission, patient up fevers as 104. Noted to have markedly lactic acid doses. CT of the abdomen did noted questionable left lower lobe infiltrate and phone hydration, follow-up chest x-ray on 11/12 noted same. Patient started on Levaquin which would cover both got coverage plus pulmonary-has Zithromax allergy.  Hospital Course:  Principal Problem:   SIRS (systemic inflammatory response syndrome) Active Problems:   Essential hypertension, benign: Initially patient presented with a soft blood pressure readings, likely secondary to SIRS, and blood pressure stabilized, medications resumed on discharge.    Chronic diastolic heart failure: patient already on beta blocker and ACE inhibitor. 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 Told her my concerns and likely there was a contributor factor to her infectious process this time. Plan will be for patient to start on a bowel regimen of Colace and every other day Maalox.    Dehydration/Acute renal failure in the setting of stage II chronic kidney disease: Patient treated with IV fluids and his pressures improved and diet advanced, able to be weaned off.  BUN at 16 and creatinine 0.77 on discharge.   CAP (community acquired pneumonia): As above on IV Levaquin. Will be discharged on 4 more days for total 7 days therapy    Lactic acidosis:secondary to early sirs, resolved with treatment   Transaminitis: mild elevation of LFTs likely due to shock liver.  Statin medication on hold until follow-up by PCP  Diabetes type 2, uncontrolled: Initially hypoglycemic but then became hyperglycemic once appetite improved and diet started. A1c 2 months ago was 7.7 so will continue on home dose upon discharge attributing hypoglycemia to infection     toxicMetabolic encephalopathy: CT of the head on admission revealing. Felt to be secondary to hypoglycemia plus SIRS.  With infection being treated and hypoglycemia resolved, patient's mentation is back to baseline.   Procedures:  none  Consultations:  none  Discharge Exam: BP 123/46 mmHg  Pulse 68  Temp(Src) 98.7 F (37.1 C) (Oral)  Resp 16  Ht 4' 11.06" (1.5 m)  Wt 57.335 kg (126 lb 6.4 oz)  BMI 25.48 kg/m2  SpO2 98%  General: alert and oriented 3, no acute distress Cardiovascular: regular rate and rhythm, S1-S2 Respiratory: clear to auscultation bilaterally  Discharge Instructions You were cared for by a hospitalist during your hospital stay. If you have any questions about your discharge medications or the care you received while you were in the hospital after you are discharged, you can call the unit and asked to speak with the hospitalist on call if the hospitalist that took care of you is not available. Once you are discharged, your primary care physician will handle any further medical issues. Please note that NO REFILLS for any discharge medications will be authorized once you are discharged, as it is imperative that you return to your primary care physician (or establish a relationship with a primary care physician if you do not have one) for your aftercare needs so that they can reassess your need for medications and monitor your lab values.  Discharge Instructions    Diet - low sodium heart healthy    Complete by:  As directed      Increase activity slowly    Complete by:  As directed             Medication List    STOP taking these medications        simvastatin 40 MG tablet  Commonly known as:  ZOCOR      TAKE these medications        ACCU-CHEK FASTCLIX LANCETS Misc  USE FOR FINGERSTICK BLOOD SUGAR TESTING SIX TIMES DAILY     ACCU-CHEK NANO SMARTVIEW W/DEVICE Kit  USE FOR FINGERSTICK BLOOD SUGAR TESTING SIX TIMES DAILY     acetaminophen 325 MG  tablet  Commonly known as:  TYLENOL  Take 2 tablets (650 mg total) by mouth every 6 (six) hours as needed.     alendronate 70 MG tablet  Commonly known as:  FOSAMAX  TAKE 1 TABLET EVERY WEEK     ALPRAZolam 0.5 MG tablet  Commonly known as:  XANAX  Take 1 tablet (0.5 mg total) by mouth 2 (two) times daily as needed for anxiety or sleep.     aspirin 81 MG tablet  Take 81 mg by mouth daily.     B-D SINGLE USE SWABS REGULAR Pads  USE FOR FINGERSTICK BLOOD SUGAR TESTING SIX TIMES DAILY     B-D UF III MINI PEN NEEDLES 31G X 5 MM Misc  Generic drug:  Insulin Pen Needle  USE WITH PENS UP TO FOUR TIMES DAILY     CALCIUM-MAGNESIUM-ZINC PO  Take 1 tablet by mouth daily.     cetirizine 10 MG tablet  Commonly known as:  ZYRTEC  Take 1 tablet (10 mg total) by mouth daily.     DSS 100 MG Caps  Take 100 mg by mouth 2 (two)  times daily.     ferrous sulfate 325 (65 FE) MG tablet  Take 1 tablet (325 mg total) by mouth 2 (two) times daily with a meal.     fexofenadine-pseudoephedrine 180-240 MG per 24 hr tablet  Commonly known as:  ALLEGRA-D 24  1 tablet daily.     fluticasone 50 MCG/ACT nasal spray  Commonly known as:  FLONASE  Place 1 spray into both nostrils daily as needed for allergies.     furosemide 20 MG tablet  Commonly known as:  LASIX  Take 1 tablet (20 mg total) by mouth daily.     glucose blood test strip  Commonly known as:  ACCU-CHEK SMARTVIEW  USE FOR FINGERSTICK BLOOD SUGAR TESTING SIX TIMES DAILY     insulin glargine 100 UNIT/ML injection  Commonly known as:  LANTUS  Inject 0.15 mLs (15 Units total) into the skin every morning.     insulin regular 100 units/mL injection  Commonly known as:  NOVOLIN R,HUMULIN R  Sliding scale : CBG < 70: Drink juice; CBG 70 - 120: 0 units: CBG 121 - 150: 2 units; CBG 151 - 200: 3 units; CBG 201 - 250: 5 units; CBG 251 - 300: 8 units;CBG 301 - 350: 11 units; CBG 351 - 400: 15 units; CBG > 400 : 15 units and notify MD      levofloxacin 500 MG tablet  Commonly known as:  LEVAQUIN  Take 1 tablet (500 mg total) by mouth daily.     levothyroxine 25 MCG tablet  Commonly known as:  SYNTHROID, LEVOTHROID     levothyroxine 50 MCG tablet  Commonly known as:  SYNTHROID, LEVOTHROID     lisinopril 5 MG tablet  Commonly known as:  PRINIVIL,ZESTRIL  TAKE 1 TABLET EVERY DAY     meclizine 25 MG tablet  Commonly known as:  ANTIVERT  Take 1 tablet (25 mg total) by mouth 3 (three) times daily as needed for dizziness.     metoprolol tartrate 25 MG tablet  Commonly known as:  LOPRESSOR  TAKE 1 TABLET TWICE DAILY     multivitamin tablet  Take 1 tablet by mouth daily.     NITROSTAT 0.4 MG SL tablet  Generic drug:  nitroGLYCERIN  Place 1 tablet (0.4 mg total) under the tongue every 5 (five) minutes as needed for chest pain.     polyethylene glycol packet  Commonly known as:  MIRALAX / GLYCOLAX  Take 17 g by mouth every other day.     potassium chloride SA 20 MEQ tablet  Commonly known as:  K-DUR,KLOR-CON  Take 1 tablet (20 mEq total) by mouth daily.     prenatal multivitamin Tabs tablet  Take 1 tablet by mouth daily at 12 noon.     riboflavin 100 MG Tabs tablet  Commonly known as:  VITAMIN B-2  Take 100 mg by mouth daily.     vitamin B-12 1000 MCG tablet  Commonly known as:  CYANOCOBALAMIN  Take 1 tablet (1,000 mcg total) by mouth daily.     vitamin C 500 MG tablet  Commonly known as:  ASCORBIC ACID  Take 500 mg by mouth daily.     ZOSTAVAX 78938 UNT/0.65ML injection  Generic drug:  zoster vaccine live (PF)       Allergies  Allergen Reactions  . Azithromycin Other (See Comments)    Hospital reaction  . Codeine     REACTION: nausea       Follow-up Information    Follow  up with Redge Gainer, MD In 2 weeks.   Specialty:  Family Medicine   Contact information:   [place] [address] [phone]        The results of significant diagnostics from this hospitalization  (including imaging, microbiology, ancillary and laboratory) are listed below for reference.    Significant Diagnostic Studies: Ct Head Wo Contrast  03/14/2014   CLINICAL DATA:  73 year old diabetic female with confusion. Initial encounter.  EXAM: CT HEAD WITHOUT CONTRAST  TECHNIQUE: Contiguous axial images were obtained from the base of the skull through the vertex without intravenous contrast.  COMPARISON:  08/15/2003 MR.  No comparison CT.  FINDINGS: Exam is slightly motion degraded.  No intracranial hemorrhage.  Small vessel disease type changes without CT evidence of large acute infarct.  Atrophy without hydrocephalus.  No intracranial mass lesion noted on this unenhanced exam.  Vascular calcifications.  No intracranial mass lesion noted on this unenhanced exam.  IMPRESSION: Exam is slightly motion degraded.  No intracranial hemorrhage.  Small vessel disease type changes without CT evidence of large acute infarct.  Atrophy without hydrocephalus.   Electronically Signed   By: Chauncey Cruel M.D.   On: 03/14/2014 13:10   Dg Chest Port 1 View  03/16/2014   CLINICAL DATA:  Pneumonia.  Shortness of breath.  EXAM: PORTABLE CHEST - 1 VIEW  COMPARISON:  Single view of the chest 03/14/2014.  FINDINGS: There is cardiomegaly. Left basilar airspace disease has increased since the prior examination. No pneumothorax is identified. There appears to be be small bilateral pleural effusions.  IMPRESSION: Worsened left basilar airspace disease worrisome for pneumonia.  Cardiomegaly without edema.  Likely small bilateral pleural effusions.   Electronically Signed   By: Inge Rise M.D.   On: 03/16/2014 05:05   Dg Chest Port 1 View  03/14/2014   CLINICAL DATA:  Altered mental status, hypoglycemia. COPD, diabetic.  EXAM: PORTABLE CHEST - 1 VIEW  COMPARISON:  11/12/2012 .  FINDINGS: Mild cardiomegaly. Diffuse interstitial prominence throughout the lungs has increased since prior study. This could reflect mild  interstitial edema. No confluent opacities or effusions. No acute bony abnormality.  IMPRESSION: Mild diffuse interstitial prominence throughout the lungs, question interstitial edema.   Electronically Signed   By: Rolm Baptise M.D.   On: 03/14/2014 12:48   Ct Cta Abd/pel W/cm &/or W/o Cm  03/14/2014   CLINICAL DATA:  Abdominal pain. Elevated lactate. Rule out mesenteric ischemia.  EXAM: CTA ABDOMEN AND PELVIS wITHOUT AND WITH CONTRAST  TECHNIQUE: Multidetector CT imaging of the abdomen and pelvis was performed using the standard protocol during bolus administration of intravenous contrast. Multiplanar reconstructed images and MIPs were obtained and reviewed to evaluate the vascular anatomy.  CONTRAST:  11m OMNIPAQUE IOHEXOL 350 MG/ML SOLN  COMPARISON:  None.  FINDINGS: The aorta is heavily calcified. Superior mesenteric artery and celiac artery are widely patent without stenosis. Inferior mesenteric artery is stenotic at its origin but patent. Single left renal arteries widely patent. 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 The right renal artery arises from the aorta at the bifurcation without visible stenosis.  Calcifications within the liver and spleen compatible with old granulomatous disease. Gallbladder, pancreas, adrenals and left kidney are unremarkable. Again, right kidney is within the pelvis. Areas of cortical loss and scarring within the right kidney.  Appendix is visualized and is normal. Stomach and small bowel decompressed, grossly unremarkable.  The rectum is dilated and stool filled. There is mild  rectal wall thickening posteriorly and significant edema/ stranding in the presacral space and around the lateral walls of the rectum. Findings suggestive of proctitis. Probable fecal impaction as well. No free fluid, free air or adenopathy.  Prior hysterectomy. Urinary bladder is decompressed with Foley catheter in place. No acute bony abnormality or focal bone lesion.  Cardiomegaly.  Calcifications within the posterior pericardium compatible with calcific pericarditis. Left lower lobe atelectasis or early infiltrate. Right lung is clear. No effusions.  Review of the MIP images confirms the above findings.  IMPRESSION: Dilated stool-filled rectum suggesting fecal impaction. There is also mild wall thickening in the rectum and stranding in the perirectal soft tissues. Findings suggestive of proctitis.  Right pelvic kidney.  No evidence of bowel ischemia.  Old granulomas disease in the liver and spleen.  Calcific pericarditis.  Cardiomegaly.  Left lower lobe atelectasis or infiltrate.   Electronically Signed   By: Rolm Baptise M.D.   On: 03/14/2014 15:46    Microbiology: Recent Results (from the past 240 hour(s))  Urine culture     Status: None   Collection Time: 03/14/14 12:27 PM  Result Value Ref Range Status   Specimen Description URINE, CATHETERIZED  Final   Special Requests NONE  Final   Culture  Setup Time   Final    03/14/2014 16:57 Performed at [place] Performed at [facility]   Final   Culture NO GROWTH Performed at [facility]   Final   Report Status 03/15/2014 FINAL  Final  Blood Culture (routine x 2)     Status: None (Preliminary result)   Collection Time: 03/14/14  1:15 PM  Result Value Ref Range Status   Specimen Description BLOOD LEFT ANTECUBITAL  Final   Special Requests BOTTLES DRAWN AEROBIC AND ANAEROBIC 10CC  Final   Culture  Setup Time   Final    03/14/2014 22:07 Performed at [facility]    Culture   Final           BLOOD CULTURE RECEIVED NO GROWTH TO DATE CULTURE WILL BE HELD FOR 5 DAYS BEFORE ISSUING A FINAL NEGATIVE REPORT Performed at [facility]    Report Status PENDING  Incomplete  Blood Culture (routine x 2)     Status: None (Preliminary result)   Collection Time: 03/14/14  1:30 PM  Result Value Ref Range Status   Specimen Description BLOOD LEFT HAND  Final    Special Requests BOTTLES DRAWN AEROBIC AND ANAEROBIC 10CC  Final   Culture  Setup Time   Final    03/14/2014 22:08 Performed at [facility]    Culture   Final           BLOOD CULTURE RECEIVED NO GROWTH TO DATE CULTURE WILL BE HELD FOR 5 DAYS BEFORE ISSUING A FINAL NEGATIVE REPORT Performed at [facility]    Report Status PENDING  Incomplete  MRSA PCR Screening     Status: None   Collection Time: 03/14/14  9:36 PM  Result Value Ref Range Status   MRSA by PCR NEGATIVE NEGATIVE Final    Comment:        The GeneXpert MRSA Assay (FDA approved for NASAL specimens only), is one component of a comprehensive MRSA colonization surveillance program. It is not intended to diagnose MRSA infection nor to guide or monitor treatment for MRSA infections.      Labs: Basic Metabolic Panel:  Recent Labs Lab 03/14/14  1320 03/15/14 0330 03/16/14 0324 03/17/14 0440  NA 145 140 141 145  K 3.8 4.6 4.3 3.8  CL 105 102 107 112  CO2 20 19 17* 21  GLUCOSE 172* 389* 356* 134*  BUN 18 21 23 16   CREATININE 0.71 0.98 0.91 0.77  CALCIUM 9.1 7.9* 7.2* 7.3*   Liver Function Tests:  Recent Labs Lab 03/14/14 1320 03/15/14 0330 03/16/14 0324 03/17/14 0440  AST 47* 50* 56* 37  ALT 40* 32 39* 33  ALKPHOS 71 56 60 58  BILITOT 0.6 0.9 0.9 0.7  PROT 6.7 5.9* 5.4* 5.1*  ALBUMIN 3.9 3.2* 2.8* 2.5*   No results for input(s): LIPASE, AMYLASE in the last 168 hours. No results for input(s): AMMONIA in the last 168 hours. CBC:  Recent Labs Lab 03/14/14 1320 03/15/14 0330 03/16/14 0324 03/17/14 0440  WBC 14.9* 13.9* 10.4 6.4  NEUTROABS 13.6*  --   --   --   HGB 13.0 11.9* 10.7* 10.5*  HCT 39.7 35.1* 32.2* 31.0*  MCV 98.0 96.7 97.3 95.7  PLT 234 183 156 155   Cardiac Enzymes:  Recent Labs Lab 03/15/14 1420 03/16/14 0324 03/17/14 0440  CKTOTAL 811* 704* 396*   BNP: BNP (last 3 results)  Recent Labs  03/14/14 1435  PROBNP 661.6*   CBG:  Recent Labs Lab  03/16/14 1214 03/16/14 1726 03/16/14 2059 03/17/14 0801 03/17/14 1250  GLUCAP 280* 283* 254* 125* 162*       Signed:  Aneesha Holloran K  [facility] 03/17/2014, 1:23 PM

## 2014-03-17 NOTE — Progress Notes (Signed)
Physical Therapy Treatment Patient Details Name: Ellen HaverGlenda J Woods MRN: [id number] DOB: 05/10/1940 Today's Date: 03/17/2014    History of Present Illness      PT Comments    Excellent progress noted with ambulation.  Pt ambulated 250 feet with RW and min guard assist.  Pt reports only mild fatigue following activity.  Follow Up Recommendations  Home health PT;Supervision/Assistance - 24 hour     Equipment Recommendations  None recommended by PT    Recommendations for Other Services       Precautions / Restrictions Precautions Precautions: Fall    Mobility  Bed Mobility         Supine to sit: Supervision;HOB elevated Sit to supine: Supervision;HOB elevated   General bed mobility comments: no physical assist needed  Transfers   Equipment used: Rolling walker (2 wheeled)   Sit to Stand: Supervision         General transfer comment: verbal cues for safety/hand placement  Ambulation/Gait Ambulation/Gait assistance: Min guard Ambulation Distance (Feet): 250 Feet Assistive device: Rolling walker (2 wheeled) Gait Pattern/deviations: Step-through pattern;Decreased stride length Gait velocity: mildly decreased       Stairs Stairs:  (Pt declining stair training.  She reports she was taught to go up sideways using one rail last year when she broke her femur.)          Wheelchair Mobility    Modified Rankin (Stroke Patients Only)       Balance   Sitting-balance support: No upper extremity supported;Feet supported Sitting balance-Leahy Scale: Good     Standing balance support: Bilateral upper extremity supported;During functional activity Standing balance-Leahy Scale: Fair                      Cognition   Behavior During Therapy: WFL for tasks assessed/performed Overall Cognitive Status: No family/caregiver present to determine baseline cognitive functioning                      Exercises      General Comments         Pertinent Vitals/Pain Pain Assessment: No/denies pain    Home Living                      Prior Function            PT Goals (current goals can now be found in the care plan section) Progress towards PT goals: Progressing toward goals    Frequency  Min 3X/week    PT Plan Current plan remains appropriate    Co-evaluation             End of Session Equipment Utilized During Treatment: Gait belt Activity Tolerance: Patient tolerated treatment well Patient left: in chair;with call bell/phone within reach     Time: 0950-1015 PT Time Calculation (min) (ACUTE ONLY): 25 min  Charges:  $Gait Training: 23-37 mins                    G Codes:      Ilda FoilGarrow, Decarlos Empey Rene 03/17/2014, 10:16 AM

## 2014-03-20 ENCOUNTER — Telehealth: Payer: Self-pay | Admitting: Family Medicine

## 2014-03-20 ENCOUNTER — Other Ambulatory Visit: Payer: Self-pay | Admitting: Family Medicine

## 2014-03-20 LAB — CULTURE, BLOOD (ROUTINE X 2)
CULTURE: NO GROWTH
Culture: NO GROWTH

## 2014-03-20 NOTE — Telephone Encounter (Signed)
Aware. 

## 2014-03-20 NOTE — Telephone Encounter (Signed)
If she is too sick to come in then she may need to go to the emergency room again.  She is welcome to follow up.   No abx will be sent in w/o being seen.

## 2014-03-27 ENCOUNTER — Other Ambulatory Visit: Payer: Self-pay | Admitting: *Deleted

## 2014-03-27 ENCOUNTER — Telehealth: Payer: Self-pay | Admitting: *Deleted

## 2014-03-27 MED ORDER — INSULIN REGULAR HUMAN 100 UNIT/ML IJ SOLN: INTRAMUSCULAR | Status: DC

## 2014-03-27 NOTE — Telephone Encounter (Signed)
done

## 2014-04-05 ENCOUNTER — Encounter: Payer: Self-pay | Admitting: Family Medicine

## 2014-04-05 ENCOUNTER — Ambulatory Visit (INDEPENDENT_AMBULATORY_CARE_PROVIDER_SITE_OTHER): Payer: Commercial Managed Care - HMO | Admitting: Family Medicine

## 2014-04-05 VITALS — BP 137/62 | HR 88 | Temp 97.2°F | Ht 59.0 in | Wt 115.8 lb

## 2014-04-05 DIAGNOSIS — R6510 Systemic inflammatory response syndrome (SIRS) of non-infectious origin without acute organ dysfunction: Secondary | ICD-10-CM

## 2014-04-05 DIAGNOSIS — R739 Hyperglycemia, unspecified: Secondary | ICD-10-CM

## 2014-04-05 DIAGNOSIS — R5383 Other fatigue: Secondary | ICD-10-CM

## 2014-04-05 DIAGNOSIS — A419 Sepsis, unspecified organism: Secondary | ICD-10-CM

## 2014-04-05 DIAGNOSIS — E119 Type 2 diabetes mellitus without complications: Secondary | ICD-10-CM

## 2014-04-05 DIAGNOSIS — J189 Pneumonia, unspecified organism: Secondary | ICD-10-CM

## 2014-04-05 DIAGNOSIS — F329 Major depressive disorder, single episode, unspecified: Secondary | ICD-10-CM

## 2014-04-05 LAB — POCT URINALYSIS DIPSTICK
Bilirubin, UA: NEGATIVE
Glucose, UA: NEGATIVE
Ketones, UA: NEGATIVE
Leukocytes, UA: NEGATIVE
Nitrite, UA: NEGATIVE
Protein, UA: NEGATIVE
Spec Grav, UA: 1.025
Urobilinogen, UA: NEGATIVE
pH, UA: 5

## 2014-04-05 LAB — POCT UA - MICROSCOPIC ONLY
Casts, Ur, LPF, POC: NEGATIVE
Crystals, Ur, HPF, POC: NEGATIVE
Yeast, UA: NEGATIVE

## 2014-04-05 LAB — POCT CBC
Granulocyte percent: 59 %G (ref 37–80)
HCT, POC: 37.4 % — AB (ref 37.7–47.9)
Hemoglobin: 12.4 g/dL (ref 12.2–16.2)
Lymph, poc: 2.1 (ref 0.6–3.4)
MCH, POC: 32.5 pg — AB (ref 27–31.2)
MCHC: 33.3 g/dL (ref 31.8–35.4)
MCV: 97.7 fL — AB (ref 80–97)
MPV: 7.8 fL (ref 0–99.8)
POC Granulocyte: 3.8 (ref 2–6.9)
POC LYMPH PERCENT: 32.7 %L (ref 10–50)
Platelet Count, POC: 344 10*3/uL (ref 142–424)
RBC: 3.8 M/uL — AB (ref 4.04–5.48)
RDW, POC: 13.2 %
WBC: 6.4 10*3/uL (ref 4.6–10.2)

## 2014-04-05 NOTE — Progress Notes (Signed)
Subjective:    Patient ID: Ellen Woods, female    DOB: May 23, 1940, 73 y.o.   MRN: [id number]  HPI Patient was recently hospitalized for CAP and SIRS.  She was tx'd with IV abx's and IV fluids.  She was admitted 03/14/14 to 03/17/14.  She is feeling a lot better and was rx'd levaquin po qd for 5 days after DC and she is afebrile.  She denies cough.  Review of Systems  Constitutional: Negative for fever.  HENT: Negative for ear pain.   Eyes: Negative for discharge.  Respiratory: Negative for cough.   Cardiovascular: Negative for chest pain.  Gastrointestinal: Negative for abdominal distention.  Endocrine: Negative for polyuria.  Genitourinary: Negative for difficulty urinating.  Musculoskeletal: Negative for gait problem and neck pain.  Skin: Negative for color change and rash.  Neurological: Negative for speech difficulty and headaches.  Psychiatric/Behavioral: Negative for agitation.       Objective:    BP 137/62 mmHg  Pulse 88  Temp(Src) 97.2 F (36.2 C) (Oral)  Ht 4' 11" (1.499 m)  Wt 115 lb 12.8 oz (52.527 kg)  BMI 23.38 kg/m2 Physical Exam  Constitutional: She is oriented to person, place, and time. She appears well-developed and well-nourished.  HENT:  Head: Normocephalic and atraumatic.  Mouth/Throat: Oropharynx is clear and moist.  Eyes: Pupils are equal, round, and reactive to light.  Neck: Normal range of motion. Neck supple.  Cardiovascular: Normal rate and regular rhythm.   No murmur heard. Pulmonary/Chest: Effort normal and breath sounds normal.  Abdominal: Soft. Bowel sounds are normal. There is no tenderness.  Neurological: She is alert and oriented to person, place, and time.  Skin: Skin is warm and dry.  Psychiatric: She has a normal mood and affect.    Results for orders placed or performed in visit on 04/05/14  POCT CBC  Result Value Ref Range   WBC 6.4 4.6 - 10.2 K/uL   Lymph, poc 2.1 0.6 - 3.4   POC LYMPH PERCENT 32.7 10 - 50 %L   POC  Granulocyte 3.8 2 - 6.9   Granulocyte percent 59.0 37 - 80 %G   RBC 3.8 (A) 4.04 - 5.48 M/uL   Hemoglobin 12.4 12.2 - 16.2 g/dL   HCT, POC 37.4 (A) 37.7 - 47.9 %   MCV 97.7 (A) 80 - 97 fL   MCH, POC 32.5 (A) 27 - 31.2 pg   MCHC 33.3 31.8 - 35.4 g/dL   RDW, POC 13.2 %   Platelet Count, POC 344.0 142 - 424 K/uL   MPV 7.8 0 - 99.8 fL  POCT urinalysis dipstick  Result Value Ref Range   Color, UA gold    Clarity, UA clear    Glucose, UA neg    Bilirubin, UA neg    Ketones, UA neg    Spec Grav, UA 1.025    Blood, UA trace    pH, UA 5.0    Protein, UA neg    Urobilinogen, UA negative    Nitrite, UA neg    Leukocytes, UA Negative   POCT UA - Microscopic Only  Result Value Ref Range   WBC, Ur, HPF, POC 3-5    RBC, urine, microscopic occ    Bacteria, U Microscopic occ    Mucus, UA moderate    Epithelial cells, urine per micros moderate    Crystals, Ur, HPF, POC neg    Casts, Ur, LPF, POC neg    Yeast, UA neg  Assessment & Plan:     ICD-9-CM ICD-10-CM   1. Fatigue due to depression 311 F32.9 POCT CBC   780.79 R53.83 CMP14+EGFR     POCT urinalysis dipstick     POCT UA - Microscopic Only  2. SIRS (systemic inflammatory response syndrome) 995.90 A41.9 POCT CBC     CMP14+EGFR     POCT urinalysis dipstick     POCT UA - Microscopic Only  3. CAP (community acquired pneumonia) 4 J18.9 POCT CBC     CMP14+EGFR     POCT urinalysis dipstick     POCT UA - Microscopic Only  CAP resolved and recommend follow up CXR in 4 weeks.   No Follow-up on file.  Lysbeth Penner FNP

## 2014-04-06 ENCOUNTER — Other Ambulatory Visit: Payer: Self-pay | Admitting: Family Medicine

## 2014-04-06 LAB — CMP14+EGFR
ALT: 25 IU/L (ref 0–32)
AST: 27 IU/L (ref 0–40)
Albumin/Globulin Ratio: 1.8 (ref 1.1–2.5)
Albumin: 4.4 g/dL (ref 3.5–4.8)
Alkaline Phosphatase: 86 IU/L (ref 39–117)
BUN/Creatinine Ratio: 25 (ref 11–26)
BUN: 26 mg/dL (ref 8–27)
CO2: 25 mmol/L (ref 18–29)
Calcium: 9.4 mg/dL (ref 8.7–10.3)
Chloride: 97 mmol/L (ref 97–108)
Creatinine, Ser: 1.02 mg/dL — ABNORMAL HIGH (ref 0.57–1.00)
GFR calc Af Amer: 63 mL/min/{1.73_m2} (ref >59)
GFR calc non Af Amer: 55 mL/min/{1.73_m2} — ABNORMAL LOW (ref >59)
Globulin, Total: 2.5 g/dL (ref 1.5–4.5)
Glucose: 231 mg/dL — ABNORMAL HIGH (ref 65–99)
Potassium: 3.8 mmol/L (ref 3.5–5.2)
Sodium: 140 mmol/L (ref 134–144)
Total Bilirubin: 0.7 mg/dL (ref 0.0–1.2)
Total Protein: 6.9 g/dL (ref 6.0–8.5)

## 2014-04-10 ENCOUNTER — Other Ambulatory Visit: Payer: Self-pay | Admitting: Family Medicine

## 2014-04-10 LAB — POCT GLYCOSYLATED HEMOGLOBIN (HGB A1C): Hemoglobin A1C: 8

## 2014-04-10 NOTE — Addendum Note (Signed)
Addended by: Prescott GumLAND, Sherrill Mckamie M on: 04/10/2014 03:49 PM   Modules accepted: Orders

## 2014-04-17 ENCOUNTER — Ambulatory Visit (INDEPENDENT_AMBULATORY_CARE_PROVIDER_SITE_OTHER): Payer: Commercial Managed Care - HMO | Admitting: Nurse Practitioner

## 2014-04-17 ENCOUNTER — Encounter: Payer: Self-pay | Admitting: Nurse Practitioner

## 2014-04-17 ENCOUNTER — Ambulatory Visit (INDEPENDENT_AMBULATORY_CARE_PROVIDER_SITE_OTHER): Payer: Commercial Managed Care - HMO

## 2014-04-17 VITALS — BP 176/76 | HR 73 | Temp 97.4°F | Ht 59.0 in | Wt 120.0 lb

## 2014-04-17 DIAGNOSIS — S62607A Fracture of unspecified phalanx of left little finger, initial encounter for closed fracture: Secondary | ICD-10-CM

## 2014-04-17 DIAGNOSIS — M79645 Pain in left finger(s): Secondary | ICD-10-CM

## 2014-04-17 NOTE — Progress Notes (Signed)
Subjective:    Ellen Woods is a 73 y.o. female who presents for a follow-up evaluation of Type 1 diabetes mellitus.  She reports that she fell today at home and thinks her finger might be broken. Patient triaged to Bennie PieriniMary Margaret Martin, NP for evaluation.

## 2014-04-17 NOTE — Patient Instructions (Signed)
Finger Fracture °A finger fracture is when one or more bones in the finger break.  °HOME CARE  °· Wear the splint, tape, or cast as long as told by your doctor. °· Keep your fingers in the position your doctor tell you to. °· Raise (elevate) the injured area above the level of the heart. °· Only take medicine as told by your doctor. °· Put ice on the injured area. °¨ Put ice in a plastic bag. °¨ Place a towel between the skin and the bag. °¨ Leave the ice on for 15-20 minutes, 03-04 times a day. °· Follow up with your doctor. °· Ask what exercises you can do when the splint comes off. °GET HELP RIGHT AWAY IF:  °· The fingernails are white or bluish. °· You have pain not helped by medicine. °· You cannot move your fingertips. °· You lose feeling (numbness) in the injured finger(s). °MAKE SURE YOU:  °· Understand these instructions. °· Will watch this condition. °· Will get help right away if you are not doing well or get worse. °Document Released: 10/08/2007 Document Revised: 07/14/2011 Document Reviewed: 10/08/2007 °ExitCare® Patient Information ©2015 [employer]. This information is not intended to replace advice given to you by your health care provider. Make sure you discuss any questions you have with your health care provider. ° °

## 2014-04-17 NOTE — Progress Notes (Signed)
   Subjective:    Patient ID: Ellen Woods, female    DOB: 06/28/1940, 73 y.o.   MRN: [id number]  HPI Patient in today c/o falling at home and injurying her left 5th finger. She had some pain pills at home and took 1 which has helped with pain.    Review of Systems  All other systems reviewed and are negative.      Objective:   Physical Exam  Constitutional: She is oriented to person, place, and time. She appears well-developed and well-nourished.  Cardiovascular: Normal rate, regular rhythm and normal heart sounds.   Pulmonary/Chest: Effort normal and breath sounds normal.  Musculoskeletal:  Left fith finger sticking out laterally with echymosis- pain with any movement Unable to flex due to pain.  Neurological: She is alert and oriented to person, place, and time.  Skin: Skin is warm.  Psychiatric: She has a normal mood and affect. Her behavior is normal. Judgment and thought content normal.  BP 176/76 mmHg  Pulse 73  Temp(Src) 97.4 F (36.3 C) (Oral)  Ht 4\' 11"  (1.499 m)  Wt 120 lb (54.432 kg)  BMI 24.22 kg/m2  Left 5th finger x ary- communiuted fracture of left 5th proximal phalanx- Preliminary reading by Paulene FloorMary Yavonne Kiss, FNP  [facility]        Assessment & Plan:  1. Pain in finger of left hand - DG Finger Little Left; Future  2. Fracture of phalanx of left little finger, closed, initial encounter Wrapped with ace wrap - Ambulatory referral to Orthopedic Surgery- today  [name], FNP

## 2014-04-18 ENCOUNTER — Other Ambulatory Visit: Payer: Self-pay | Admitting: Family Medicine

## 2014-04-18 ENCOUNTER — Other Ambulatory Visit: Payer: Self-pay | Admitting: *Deleted

## 2014-04-18 NOTE — Telephone Encounter (Signed)
Please call in xanax with 0 refills 

## 2014-04-18 NOTE — Telephone Encounter (Signed)
Last seen 04/07/14  MMM If approved print and route to nurse for mail order

## 2014-04-18 NOTE — Telephone Encounter (Signed)
Patient aware, script for xanax ready.

## 2014-04-24 ENCOUNTER — Ambulatory Visit: Payer: Self-pay

## 2014-05-15 DIAGNOSIS — S62617D Displaced fracture of proximal phalanx of left little finger, subsequent encounter for fracture with routine healing: Secondary | ICD-10-CM | POA: Diagnosis not present

## 2014-05-19 DIAGNOSIS — S62617D Displaced fracture of proximal phalanx of left little finger, subsequent encounter for fracture with routine healing: Secondary | ICD-10-CM | POA: Diagnosis not present

## 2014-06-02 DIAGNOSIS — S62617D Displaced fracture of proximal phalanx of left little finger, subsequent encounter for fracture with routine healing: Secondary | ICD-10-CM | POA: Diagnosis not present

## 2014-06-05 DIAGNOSIS — S62617D Displaced fracture of proximal phalanx of left little finger, subsequent encounter for fracture with routine healing: Secondary | ICD-10-CM | POA: Diagnosis not present

## 2014-07-10 ENCOUNTER — Other Ambulatory Visit: Payer: Self-pay | Admitting: Radiology

## 2014-07-10 DIAGNOSIS — I6523 Occlusion and stenosis of bilateral carotid arteries: Secondary | ICD-10-CM

## 2014-07-12 ENCOUNTER — Encounter (INDEPENDENT_AMBULATORY_CARE_PROVIDER_SITE_OTHER): Payer: Commercial Managed Care - HMO

## 2014-07-12 DIAGNOSIS — I6523 Occlusion and stenosis of bilateral carotid arteries: Secondary | ICD-10-CM | POA: Diagnosis not present

## 2014-07-13 DIAGNOSIS — Z833 Family history of diabetes mellitus: Secondary | ICD-10-CM | POA: Diagnosis not present

## 2014-07-13 DIAGNOSIS — Z794 Long term (current) use of insulin: Secondary | ICD-10-CM | POA: Diagnosis not present

## 2014-07-13 DIAGNOSIS — E780 Pure hypercholesterolemia: Secondary | ICD-10-CM | POA: Diagnosis not present

## 2014-07-13 DIAGNOSIS — E119 Type 2 diabetes mellitus without complications: Secondary | ICD-10-CM | POA: Diagnosis not present

## 2014-07-13 DIAGNOSIS — T383X1A Poisoning by insulin and oral hypoglycemic [antidiabetic] drugs, accidental (unintentional), initial encounter: Secondary | ICD-10-CM | POA: Diagnosis not present

## 2014-07-13 DIAGNOSIS — I10 Essential (primary) hypertension: Secondary | ICD-10-CM | POA: Diagnosis not present

## 2014-07-13 DIAGNOSIS — E11649 Type 2 diabetes mellitus with hypoglycemia without coma: Secondary | ICD-10-CM | POA: Diagnosis not present

## 2014-07-13 DIAGNOSIS — I509 Heart failure, unspecified: Secondary | ICD-10-CM | POA: Diagnosis not present

## 2014-07-13 DIAGNOSIS — E039 Hypothyroidism, unspecified: Secondary | ICD-10-CM | POA: Diagnosis not present

## 2014-07-18 ENCOUNTER — Telehealth: Payer: Self-pay

## 2014-07-18 NOTE — Telephone Encounter (Signed)
Pt woke up and is having "floaters"in her eyes. Please contact her with advice. Pt was advised to call her eye doctor and if she needs a referral to call us back

## 2014-07-21 ENCOUNTER — Ambulatory Visit (INDEPENDENT_AMBULATORY_CARE_PROVIDER_SITE_OTHER): Payer: Commercial Managed Care - HMO | Admitting: Nurse Practitioner

## 2014-07-21 ENCOUNTER — Encounter: Payer: Self-pay | Admitting: Nurse Practitioner

## 2014-07-21 VITALS — BP 146/65 | HR 75 | Temp 97.5°F | Ht 59.0 in | Wt 120.0 lb

## 2014-07-21 DIAGNOSIS — N179 Acute kidney failure, unspecified: Secondary | ICD-10-CM | POA: Diagnosis not present

## 2014-07-21 DIAGNOSIS — IMO0002 Reserved for concepts with insufficient information to code with codable children: Secondary | ICD-10-CM

## 2014-07-21 DIAGNOSIS — I10 Essential (primary) hypertension: Secondary | ICD-10-CM | POA: Diagnosis not present

## 2014-07-21 DIAGNOSIS — N189 Chronic kidney disease, unspecified: Secondary | ICD-10-CM

## 2014-07-21 DIAGNOSIS — K5900 Constipation, unspecified: Secondary | ICD-10-CM | POA: Diagnosis not present

## 2014-07-21 DIAGNOSIS — E785 Hyperlipidemia, unspecified: Secondary | ICD-10-CM

## 2014-07-21 DIAGNOSIS — E876 Hypokalemia: Secondary | ICD-10-CM

## 2014-07-21 DIAGNOSIS — I2510 Atherosclerotic heart disease of native coronary artery without angina pectoris: Secondary | ICD-10-CM | POA: Diagnosis not present

## 2014-07-21 DIAGNOSIS — E039 Hypothyroidism, unspecified: Secondary | ICD-10-CM

## 2014-07-21 DIAGNOSIS — I5032 Chronic diastolic (congestive) heart failure: Secondary | ICD-10-CM

## 2014-07-21 DIAGNOSIS — D513 Other dietary vitamin B12 deficiency anemia: Secondary | ICD-10-CM | POA: Diagnosis not present

## 2014-07-21 DIAGNOSIS — E1165 Type 2 diabetes mellitus with hyperglycemia: Secondary | ICD-10-CM | POA: Diagnosis not present

## 2014-07-21 DIAGNOSIS — J302 Other seasonal allergic rhinitis: Secondary | ICD-10-CM | POA: Diagnosis not present

## 2014-07-21 DIAGNOSIS — D518 Other vitamin B12 deficiency anemias: Secondary | ICD-10-CM | POA: Diagnosis not present

## 2014-07-21 LAB — POCT UA - MICROALBUMIN: MICROALBUMIN (UR) POC: 20 mg/L

## 2014-07-21 LAB — POCT GLYCOSYLATED HEMOGLOBIN (HGB A1C): HEMOGLOBIN A1C: 8.8

## 2014-07-21 MED ORDER — INSULIN REGULAR HUMAN 100 UNIT/ML IJ SOLN: INTRAMUSCULAR | Status: DC

## 2014-07-21 MED ORDER — LISINOPRIL 5 MG PO TABS: 5.0000 mg | ORAL_TABLET | Freq: Every day | ORAL | Status: DC

## 2014-07-21 MED ORDER — METOPROLOL TARTRATE 25 MG PO TABS
ORAL_TABLET | ORAL | Status: DC
Start: 2014-07-21 — End: 2015-05-01

## 2014-07-21 MED ORDER — POTASSIUM CHLORIDE CRYS ER 20 MEQ PO TBCR: 20.0000 meq | EXTENDED_RELEASE_TABLET | Freq: Every day | ORAL | Status: DC

## 2014-07-21 MED ORDER — INSULIN GLARGINE 100 UNIT/ML ~~LOC~~ SOLN: 20.0000 [IU] | Freq: Every morning | SUBCUTANEOUS | Status: DC

## 2014-07-21 MED ORDER — FUROSEMIDE 20 MG PO TABS: 20.0000 mg | ORAL_TABLET | Freq: Every day | ORAL | Status: DC

## 2014-07-21 NOTE — Progress Notes (Signed)
Subjective:    Patient ID: Ellen Woods, female    DOB: April 26, 1941, 74 y.o.   MRN: [id number]   Patient here today for follow up of chronic medical problems.   Hypertension This is a chronic problem. The current episode started more than 1 year ago. The problem has been waxing and waning since onset. The problem is controlled. Associated symptoms include peripheral edema. Pertinent negatives include no headaches, orthopnea, palpitations or shortness of breath. Risk factors for coronary artery disease include diabetes mellitus, dyslipidemia, post-menopausal state and sedentary lifestyle. Past treatments include ACE inhibitors. The current treatment provides moderate improvement. Compliance problems include diet and exercise.  Hypertensive end-organ damage includes CAD/MI, heart failure and a thyroid problem.  Hyperlipidemia This is a chronic problem. The current episode started more than 1 year ago. Recent lipid tests were reviewed and are variable. Exacerbating diseases include diabetes. She has no history of hypothyroidism or obesity. Pertinent negatives include no shortness of breath. She is currently on no antihyperlipidemic treatment. Compliance problems include adherence to diet and adherence to exercise.  Risk factors for coronary artery disease include dyslipidemia, hypertension, post-menopausal and a sedentary lifestyle.  Diabetes She presents for her follow-up diabetic visit. She has type 2 diabetes mellitus. No MedicAlert identification noted. Her disease course has been fluctuating. Pertinent negatives for hypoglycemia include no headaches. Symptoms are stable. Risk factors for coronary artery disease include diabetes mellitus, dyslipidemia, hypertension, post-menopausal and sedentary lifestyle. Current diabetic treatment includes insulin injections. Her weight is fluctuating minimally. When asked about meal planning, she reported none. She has not had a previous visit with a dietitian.  She rarely participates in exercise. Her breakfast blood glucose is taken between 9-10 am. Her breakfast blood glucose range is generally 140-180 mg/dl. Her lunch blood glucose range is generally 140-180 mg/dl. Her dinner blood glucose range is generally 140-180 mg/dl. Her highest blood glucose is >200 mg/dl. Her overall blood glucose range is 140-180 mg/dl. An ACE inhibitor/angiotensin II receptor blocker is being taken. She does not see a podiatrist.Eye exam is not current.  Thyroid Problem Presents for follow-up (hypothyroidism) visit. Patient reports no palpitations. (Patient has not been taking synthroid- said that B. [place] told her to stop taking.) Her past medical history is significant for diabetes, heart failure and hyperlipidemia.  CHF Currently on lasix an d lopressor- has occasionaly swelling and SOB Anemia/ b12 anemia Unknown cause of anemia- on iron supplements- causes some constipation- also takes b12 njections monthly hypokalemia k dur daily- no c/o lower ext cramping Allergic rhinitis Zyrtec and  flonase daily- doing ok on current meds constipation Mainly due to iron tablets- takes miralax which works quite well.   Review of Systems  Constitutional: Negative.   HENT: Negative.   Respiratory: Negative for shortness of breath.   Cardiovascular: Negative for palpitations and orthopnea.  Genitourinary: Negative.   Neurological: Negative for headaches.  Psychiatric/Behavioral: Negative.   All other systems reviewed and are negative.      Objective:   Physical Exam  Constitutional: She is oriented to person, place, and time. She appears well-developed and well-nourished.  HENT:  Nose: Nose normal.  Mouth/Throat: Oropharynx is clear and moist.  Eyes: EOM are normal.  Neck: Trachea normal, normal range of motion and full passive range of motion without pain. Neck supple. No JVD present. Carotid bruit is not present. No thyromegaly present.  Cardiovascular: Normal rate,  regular rhythm, normal heart sounds and intact distal pulses.  Exam reveals no gallop and no friction  rub.   No murmur heard. Pulmonary/Chest: Effort normal and breath sounds normal.  Abdominal: Soft. Bowel sounds are normal. She exhibits no distension and no mass. There is no tenderness.  Musculoskeletal: Normal range of motion.  Lymphadenopathy:    She has no cervical adenopathy.  Neurological: She is alert and oriented to person, place, and time. She has normal reflexes.  Skin: Skin is warm and dry.  Psychiatric: She has a normal mood and affect. Her behavior is normal. Judgment and thought content normal.   BP 146/65 mmHg  Pulse 75  Temp(Src) 97.5 F (36.4 C) (Oral)  Ht 4' 11"  (1.499 m)  Wt 120 lb (54.432 kg)  BMI 24.22 kg/m2  Results for orders placed or performed in visit on 07/21/14  POCT glycosylated hemoglobin (Hb A1C)  Result Value Ref Range   Hemoglobin A1C 8.8   POCT UA - Microalbumin  Result Value Ref Range   Microalbumin Ur, POC 20 mg/L   Creatinine, POC  mg/dL   Albumin/Creatinine Ratio, Urine, POC            Assessment & Plan:  1. Essential hypertension, benign Do not add slat to diet - CMP14+EGFR - lisinopril (PRINIVIL,ZESTRIL) 5 MG tablet; Take 1 tablet (5 mg total) by mouth daily.  Dispense: 90 tablet; Refill: 1  2. Hyperlipidemia with target LDL less than 100 Low fat diet Currently not taking a statin - NMR, lipoprofile  3. Atherosclerosis of native coronary artery of native heart without angina pectoris - metoprolol tartrate (LOPRESSOR) 25 MG tablet; TAKE 1 TABLET TWICE DAILY  Dispense: 180 tablet; Refill: 1  4. Constipation, unspecified constipation type 5. Acute on chronic renal failure  6. Diabetes type 2, uncontrolled Stricter carb counting Increased lantus to 20 u- 18 u for 2 nights then to 20u - POCT glycosylated hemoglobin (Hb A1C) - POCT UA - Microalbumin - insulin glargine (LANTUS) 100 UNIT/ML injection; Inject 0.2 mLs (20 Units  total) into the skin every morning.  Dispense: 30 mL; Refill: 5 - insulin regular (NOVOLIN R,HUMULIN R) 100 units/mL injection; Inject up to 15 units tid prior to meals per sliding scale  Dispense: 45 mL; Refill: 5  7. Hypokalemia - potassium chloride SA (K-DUR,KLOR-CON) 20 MEQ tablet; Take 1 tablet (20 mEq total) by mouth daily.  Dispense: 90 tablet; Refill: 1  8. Other dietary vitamin B12 deficiency anemia - Anemia Profile B  9. Other seasonal allergic rhinitis  10. Hypothyroidism, unspecified hypothyroidism type - Thyroid Panel With TSH  11. Chronic diastolic congestive heart failure - furosemide (LASIX) 20 MG tablet; Take 1 tablet (20 mg total) by mouth daily.  Dispense: 90 tablet; Refill: 1    Labs pending Health maintenance reviewed Diet and exercise encouraged Continue all meds Follow up  In 3  months   [place], FNP

## 2014-07-21 NOTE — Patient Instructions (Signed)
Diabetes and Foot Care Diabetes may cause you to have problems because of poor blood supply (circulation) to your feet and legs. This may cause the skin on your feet to become thinner, break easier, and heal more slowly. Your skin may become dry, and the skin may peel and crack. You may also have nerve damage in your legs and feet causing decreased feeling in them. You may not notice minor injuries to your feet that could lead to infections or more serious problems. Taking care of your feet is one of the most important things you can do for yourself.  HOME CARE INSTRUCTIONS  Wear shoes at all times, even in the house. Do not go barefoot. Bare feet are easily injured.  Check your feet daily for blisters, cuts, and redness. If you cannot see the bottom of your feet, use a mirror or ask someone for help.  Wash your feet with warm water (do not use hot water) and mild soap. Then pat your feet and the areas between your toes until they are completely dry. Do not soak your feet as this can dry your skin.  Apply a moisturizing lotion or petroleum jelly (that does not contain alcohol and is unscented) to the skin on your feet and to dry, brittle toenails. Do not apply lotion between your toes.  Trim your toenails straight across. Do not dig under them or around the cuticle. File the edges of your nails with an emery board or nail file.  Do not cut corns or calluses or try to remove them with medicine.  Wear clean socks or stockings every day. Make sure they are not too tight. Do not wear knee-high stockings since they may decrease blood flow to your legs.  Wear shoes that fit properly and have enough cushioning. To break in new shoes, wear them for just a few hours a day. This prevents you from injuring your feet. Always look in your shoes before you put them on to be sure there are no objects inside.  Do not cross your legs. This may decrease the blood flow to your feet.  If you find a minor scrape,  cut, or break in the skin on your feet, keep it and the skin around it clean and dry. These areas may be cleansed with mild soap and water. Do not cleanse the area with peroxide, alcohol, or iodine.  When you remove an adhesive bandage, be sure not to damage the skin around it.  If you have a wound, look at it several times a day to make sure it is healing.  Do not use heating pads or hot water bottles. They may burn your skin. If you have lost feeling in your feet or legs, you may not know it is happening until it is too late.  Make sure your health care provider performs a complete foot exam at least annually or more often if you have foot problems. Report any cuts, sores, or bruises to your health care provider immediately. SEEK MEDICAL CARE IF:   You have an injury that is not healing.  You have cuts or breaks in the skin.  You have an ingrown nail.  You notice redness on your legs or feet.  You feel burning or tingling in your legs or feet.  You have pain or cramps in your legs and feet.  Your legs or feet are numb.  Your feet always feel cold. SEEK IMMEDIATE MEDICAL CARE IF:   There is increasing redness,   swelling, or pain in or around a wound.  There is a red line that goes up your leg.  Pus is coming from a wound.  You develop a fever or as directed by your health care provider.  You notice a bad smell coming from an ulcer or wound. Document Released: 04/18/2000 Document Revised: 12/22/2012 Document Reviewed: 09/28/2012 ExitCare Patient Information 2015 [employer]. This information is not intended to replace advice given to you by your health care provider. Make sure you discuss any questions you have with your health care provider.  

## 2014-07-21 NOTE — Addendum Note (Signed)
Addended by: Tommas OlpHANDY, Jibreel Fedewa N on: 07/21/2014 01:59 PM   Modules accepted: Orders

## 2014-07-21 NOTE — Telephone Encounter (Signed)
Patient states that the floaters are gone and she did not go to eye dr.

## 2014-07-22 LAB — CMP14+EGFR
A/G RATIO: 1.9 (ref 1.1–2.5)
ALK PHOS: 67 IU/L (ref 39–117)
ALT: 16 IU/L (ref 0–32)
AST: 21 IU/L (ref 0–40)
Albumin: 4.1 g/dL (ref 3.5–4.8)
BILIRUBIN TOTAL: 0.5 mg/dL (ref 0.0–1.2)
BUN / CREAT RATIO: 31 — AB (ref 11–26)
BUN: 28 mg/dL — ABNORMAL HIGH (ref 8–27)
CO2: 24 mmol/L (ref 18–29)
Calcium: 9 mg/dL (ref 8.7–10.3)
Chloride: 97 mmol/L (ref 97–108)
Creatinine, Ser: 0.9 mg/dL (ref 0.57–1.00)
GFR calc Af Amer: 73 mL/min/{1.73_m2} (ref >59)
GFR calc non Af Amer: 64 mL/min/{1.73_m2} (ref >59)
Globulin, Total: 2.2 g/dL (ref 1.5–4.5)
Glucose: 305 mg/dL — ABNORMAL HIGH (ref 65–99)
POTASSIUM: 4.8 mmol/L (ref 3.5–5.2)
Sodium: 137 mmol/L (ref 134–144)
TOTAL PROTEIN: 6.3 g/dL (ref 6.0–8.5)

## 2014-07-22 LAB — ANEMIA PROFILE B
BASOS: 1 %
Basophils Absolute: 0 10*3/uL (ref 0.0–0.2)
EOS: 1 %
Eosinophils Absolute: 0.1 10*3/uL (ref 0.0–0.4)
Ferritin: 639 ng/mL — ABNORMAL HIGH (ref 15–150)
Folate: >20 ng/mL (ref >3.0)
HCT: 38 % (ref 34.0–46.6)
HEMOGLOBIN: 12.6 g/dL (ref 11.1–15.9)
IRON SATURATION: 31 % (ref 15–55)
Immature Grans (Abs): 0 10*3/uL (ref 0.0–0.1)
Immature Granulocytes: 0 %
Iron: 62 ug/dL (ref 27–139)
LYMPHS: 27 %
Lymphocytes Absolute: 1.3 10*3/uL (ref 0.7–3.1)
MCH: 32.5 pg (ref 26.6–33.0)
MCHC: 33.2 g/dL (ref 31.5–35.7)
MCV: 98 fL — ABNORMAL HIGH (ref 79–97)
Monocytes Absolute: 0.5 10*3/uL (ref 0.1–0.9)
Monocytes: 10 %
NEUTROS ABS: 3 10*3/uL (ref 1.4–7.0)
Neutrophils Relative %: 61 %
Platelets: 246 10*3/uL (ref 150–379)
RBC: 3.88 x10E6/uL (ref 3.77–5.28)
RDW: 13.5 % (ref 12.3–15.4)
Retic Ct Pct: 1.4 % (ref 0.6–2.6)
TIBC: 199 ug/dL — AB (ref 250–450)
UIBC: 137 ug/dL (ref 118–369)
VITAMIN B 12: 304 pg/mL (ref 211–946)
WBC: 4.9 10*3/uL (ref 3.4–10.8)

## 2014-07-22 LAB — NMR, LIPOPROFILE
Cholesterol: 238 mg/dL — ABNORMAL HIGH (ref 100–199)
HDL CHOLESTEROL BY NMR: 73 mg/dL (ref >39)
HDL Particle Number: 37.3 umol/L (ref >=30.5)
LDL Particle Number: 1478 nmol/L — ABNORMAL HIGH (ref <1000)
LDL Size: 21.7 nm (ref >20.5)
LDL-C: 135 mg/dL — AB (ref 0–99)
LP-IR Score: <25 (ref <=45)
Small LDL Particle Number: 337 nmol/L (ref <=527)
TRIGLYCERIDES BY NMR: 148 mg/dL (ref 0–149)

## 2014-07-22 LAB — MICROALBUMIN, URINE: Microalbumin, Urine: 12.5 ug/mL (ref 0.0–17.0)

## 2014-07-27 ENCOUNTER — Telehealth: Payer: Self-pay | Admitting: *Deleted

## 2014-07-27 NOTE — Telephone Encounter (Signed)
-----   Message from [facility], FNP sent at 07/24/2014  8:11 AM EDT ----- microalbumin normal Hgba1c discussed at appointment Kidney and liver function stable ldl particle numbers are elevated Anemia panel okay Continue current meds- low fat diet and exercise and recheck in 3 months

## 2014-07-27 NOTE — Telephone Encounter (Signed)
LM,call to discuss lab results.

## 2014-07-31 NOTE — Progress Notes (Signed)
Patient aware.

## 2014-09-14 ENCOUNTER — Encounter: Payer: Self-pay | Admitting: Nurse Practitioner

## 2014-09-14 ENCOUNTER — Ambulatory Visit (INDEPENDENT_AMBULATORY_CARE_PROVIDER_SITE_OTHER): Payer: Commercial Managed Care - HMO | Admitting: Nurse Practitioner

## 2014-09-14 VITALS — BP 149/70 | HR 82 | Temp 97.1°F | Ht 59.0 in | Wt 127.8 lb

## 2014-09-14 DIAGNOSIS — M25552 Pain in left hip: Secondary | ICD-10-CM | POA: Diagnosis not present

## 2014-09-14 DIAGNOSIS — J301 Allergic rhinitis due to pollen: Secondary | ICD-10-CM | POA: Diagnosis not present

## 2014-09-14 MED ORDER — ALPRAZOLAM 0.5 MG PO TABS: 0.5000 mg | ORAL_TABLET | Freq: Two times a day (BID) | ORAL | Status: DC

## 2014-09-14 MED ORDER — CETIRIZINE HCL 10 MG PO TABS: 10.0000 mg | ORAL_TABLET | Freq: Every day | ORAL | Status: DC

## 2014-09-14 NOTE — Progress Notes (Signed)
   Subjective:    Patient ID: Ellen Woods, female    DOB: 08/07/1940, 74 y.o.   MRN: [id number]  HPI Patient in today c/o left hip pain- started several months ago- describes pain as achy- rates 7/10 -walking increase pain- sitting decreases pain. She has had surgery on hip in 2014 for hip fracture. Takes pain meds when she has to go out.    Review of Systems  Constitutional: Negative.   HENT: Negative.   Respiratory: Negative.   Cardiovascular: Negative.   Genitourinary: Negative.   Neurological: Negative.   Psychiatric/Behavioral: Negative.   All other systems reviewed and are negative.      Objective:   Physical Exam  Constitutional: She is oriented to person, place, and time. She appears well-developed and well-nourished. No distress.  Cardiovascular: Normal rate, regular rhythm and normal heart sounds.   Pulmonary/Chest: Effort normal and breath sounds normal.  Musculoskeletal:  Pain on abduction- gait steady  Neurological: She is alert and oriented to person, place, and time.  Skin: Skin is warm.  Psychiatric: She has a normal mood and affect. Her behavior is normal. Judgment and thought content normal.   BP 149/70 mmHg  Pulse 82  Temp(Src) 97.1 F (36.2 C) (Oral)  Ht 4\' 11"  (1.499 m)  Wt 127 lb 12.8 oz (57.97 kg)  BMI 25.80 kg/m2        Assessment & Plan:  1. Left hip pain Rest  - Ambulatory referral to Orthopedic Surgery  2. Allergic rhinitis due to pollen - cetirizine (ZYRTEC) 10 MG tablet; Take 1 tablet (10 mg total) by mouth daily.  Dispense: 90 tablet; Refill: 4   [name], FNP

## 2014-09-14 NOTE — Patient Instructions (Signed)
Hip Pain Your hip is the joint between your upper legs and your lower pelvis. The bones, cartilage, tendons, and muscles of your hip joint perform a lot of work each day supporting your body weight and allowing you to move around. Hip pain can range from a minor ache to severe pain in one or both of your hips. Pain may be felt on the inside of the hip joint near the groin, or the outside near the buttocks and upper thigh. You may have swelling or stiffness as well.  HOME CARE INSTRUCTIONS   Take medicines only as directed by your health care provider.  Apply ice to the injured area:  Put ice in a plastic bag.  Place a towel between your skin and the bag.  Leave the ice on for 15-20 minutes at a time, 3-4 times a day.  Keep your leg raised (elevated) when possible to lessen swelling.  Avoid activities that cause pain.  Follow specific exercises as directed by your health care provider.  Sleep with a pillow between your legs on your most comfortable side.  Record how often you have hip pain, the location of the pain, and what it feels like. SEEK MEDICAL CARE IF:   You are unable to put weight on your leg.  Your hip is red or swollen or very tender to touch.  Your pain or swelling continues or worsens after 1 week.  You have increasing difficulty walking.  You have a fever. SEEK IMMEDIATE MEDICAL CARE IF:   You have fallen.  You have a sudden increase in pain and swelling in your hip. MAKE SURE YOU:   Understand these instructions.  Will watch your condition.  Will get help right away if you are not doing well or get worse. Document Released: 10/09/2009 Document Revised: 09/05/2013 Document Reviewed: 12/16/2012 Exit[employer] Patient Information 2015 [employer]. This information is not intended to replace advice given to you by your health care provider. Make sure you discuss any questions you have with your health care provider.  

## 2014-09-25 ENCOUNTER — Other Ambulatory Visit: Payer: Self-pay | Admitting: Family Medicine

## 2014-09-25 ENCOUNTER — Other Ambulatory Visit: Payer: Self-pay | Admitting: Nurse Practitioner

## 2014-10-06 ENCOUNTER — Telehealth: Payer: Self-pay | Admitting: Nurse Practitioner

## 2014-10-11 ENCOUNTER — Telehealth: Payer: Self-pay | Admitting: Nurse Practitioner

## 2014-10-11 MED ORDER — INSULIN ASPART 100 UNIT/ML FLEXPEN: PEN_INJECTOR | SUBCUTANEOUS | Status: DC

## 2014-10-11 NOTE — Telephone Encounter (Signed)
Ok to wait till 10/12/14

## 2014-10-11 NOTE — Telephone Encounter (Signed)
Reviewed patient's medications and did not see Rx for Novolog only Novolin /Humulin R.  I spoke with patient and insulin was recently changed from Novolog flex pen to novolin to save money.  However patient would like to switch back because she does not feel that her BG is a well controlled since the switch and she prefers the convenience of the flexpen. I checked her insurance formulary and it appears that all insulins are tier 3.   [facility] Flex Pens #1 box (patient only wanted 1 box to see if BG improved)

## 2014-10-12 ENCOUNTER — Other Ambulatory Visit: Payer: Self-pay | Admitting: *Deleted

## 2014-10-12 MED ORDER — INSULIN ASPART 100 UNIT/ML FLEXPEN: PEN_INJECTOR | SUBCUTANEOUS | Status: DC

## 2014-10-12 NOTE — Telephone Encounter (Signed)
Thanks for helping with this. It was sent to [facility], I resent it to [facility], but i have called [employer] 2 times this am and held for 10 minutes, so did not cancel it

## 2014-10-13 ENCOUNTER — Ambulatory Visit (INDEPENDENT_AMBULATORY_CARE_PROVIDER_SITE_OTHER): Payer: Commercial Managed Care - HMO | Admitting: Family Medicine

## 2014-10-13 ENCOUNTER — Ambulatory Visit (INDEPENDENT_AMBULATORY_CARE_PROVIDER_SITE_OTHER): Payer: Commercial Managed Care - HMO

## 2014-10-13 ENCOUNTER — Encounter: Payer: Self-pay | Admitting: Family Medicine

## 2014-10-13 VITALS — BP 130/63 | HR 80 | Temp 96.9°F | Ht 59.0 in | Wt 116.0 lb

## 2014-10-13 DIAGNOSIS — R739 Hyperglycemia, unspecified: Secondary | ICD-10-CM

## 2014-10-13 DIAGNOSIS — E1165 Type 2 diabetes mellitus with hyperglycemia: Secondary | ICD-10-CM | POA: Diagnosis not present

## 2014-10-13 DIAGNOSIS — IMO0002 Reserved for concepts with insufficient information to code with codable children: Secondary | ICD-10-CM

## 2014-10-13 DIAGNOSIS — I10 Essential (primary) hypertension: Secondary | ICD-10-CM | POA: Diagnosis not present

## 2014-10-13 LAB — POCT CBC
Granulocyte percent: 54.7 %G (ref 37–80)
HEMATOCRIT: 40.6 % (ref 37.7–47.9)
HEMOGLOBIN: 12.8 g/dL (ref 12.2–16.2)
Lymph, poc: 1.8 (ref 0.6–3.4)
MCH, POC: 30.8 pg (ref 27–31.2)
MCHC: 31.6 g/dL — AB (ref 31.8–35.4)
MCV: 97.4 fL — AB (ref 80–97)
MPV: 7.4 fL (ref 0–99.8)
POC GRANULOCYTE: 2.7 (ref 2–6.9)
POC LYMPH %: 36.1 % (ref 10–50)
Platelet Count, POC: 239 10*3/uL (ref 142–424)
RBC: 4.17 M/uL (ref 4.04–5.48)
RDW, POC: 12.7 %
WBC: 4.9 10*3/uL (ref 4.6–10.2)

## 2014-10-13 LAB — POCT URINALYSIS DIPSTICK
BILIRUBIN UA: NEGATIVE
Blood, UA: NEGATIVE
Glucose, UA: 500
Leukocytes, UA: NEGATIVE
Nitrite, UA: NEGATIVE
PH UA: 5
Protein, UA: NEGATIVE
Spec Grav, UA: 1.01
UROBILINOGEN UA: NEGATIVE

## 2014-10-13 LAB — GLUCOSE, POCT (MANUAL RESULT ENTRY): POC GLUCOSE: 342 mg/dL — AB (ref 70–99)

## 2014-10-13 LAB — POCT UA - MICROSCOPIC ONLY
Bacteria, U Microscopic: NEGATIVE
CRYSTALS, UR, HPF, POC: NEGATIVE
Casts, Ur, LPF, POC: NEGATIVE
Mucus, UA: NEGATIVE
RBC, URINE, MICROSCOPIC: NEGATIVE
Yeast, UA: NEGATIVE

## 2014-10-13 NOTE — Progress Notes (Signed)
Subjective:    Patient ID: Ellen Woods, female    DOB: Oct 21, 1940, 74 y.o.   MRN: [id number]  HPI  74 year old female with hyperglycemia. She has a history of diabetes and currently takes Lantus 22 units a day and NovoLog 15 units prior to each meal per sliding scale. For the past 2 weeks her sugars of been elevated in the 2-400 range. She denies any signs or symptoms of infection. She does recall that she had pneumonia in the past and her sugars are elevated at that time.  Patient Active Problem List   Diagnosis Date Noted  . Hypokalemia 07/21/2014  . Other dietary vitamin B12 deficiency anemia 07/21/2014  . Other seasonal allergic rhinitis 07/21/2014  . Thyroid activity decreased 07/21/2014  . Transaminitis   . Diabetes type 2, uncontrolled   . Acute on chronic renal failure   . Metabolic encephalopathy   . Constipation 03/15/2014  . Proctitis 03/14/2014  . SIRS (systemic inflammatory response syndrome) 03/14/2014  . Chronic diastolic heart failure 58/59/2924  . Osteoporosis, unspecified 10/25/2012  . PAF (paroxysmal atrial fibrillation) 03/15/2012  . Carotid bruit 03/15/2012  . Pericardial calcification   . PDA (patent ductus arteriosus)   . Coronary atherosclerosis of native coronary artery   . Hyperlipidemia with target LDL less than 100 03/18/2010  . Essential hypertension, benign 03/18/2010  . DYSPHAGIA 01/01/2010   Outpatient Encounter Prescriptions as of 10/13/2014  Medication Sig  . ACCU-CHEK FASTCLIX LANCETS MISC USE FOR FINGERSTICK BLOOD SUGAR TESTING SIX TIMES DAILY  . acetaminophen (TYLENOL) 325 MG tablet Take 2 tablets (650 mg total) by mouth every 6 (six) hours as needed. (Patient taking differently: Take 650 mg by mouth every 6 (six) hours as needed for moderate pain. )  . Alcohol Swabs (B-D SINGLE USE SWABS REGULAR) PADS USE  6  TIMES  DAILY  . alendronate (FOSAMAX) 70 MG tablet TAKE 1 TABLET EVERY WEEK  . ALPRAZolam (XANAX) 0.5 MG tablet Take 1 tablet (0.5  mg total) by mouth 2 (two) times daily.  Marland Kitchen aspirin 81 MG tablet Take 81 mg by mouth daily.  . B-D UF III MINI PEN NEEDLES 31G X 5 MM MISC USE WITH PENS - UP TO FOUR TIMES DAILY  . Blood Glucose Monitoring Suppl (ACCU-CHEK NANO SMARTVIEW) W/DEVICE KIT USE FOR FINGERSTICK BLOOD SUGAR TESTING SIX TIMES DAILY  . CALCIUM-MAGNESIUM-ZINC PO Take 1 tablet by mouth daily.  . cetirizine (ZYRTEC) 10 MG tablet Take 1 tablet (10 mg total) by mouth daily.  Marland Kitchen docusate sodium 100 MG CAPS Take 100 mg by mouth 2 (two) times daily.  . fluticasone (FLONASE) 50 MCG/ACT nasal spray Place 1 spray into both nostrils daily as needed for allergies.   . furosemide (LASIX) 20 MG tablet Take 1 tablet (20 mg total) by mouth daily.  Marland Kitchen glucose blood (ACCU-CHEK SMARTVIEW) test strip USE FOR FINGERSTICK BLOOD SUGAR TESTING SIX TIMES DAILY  . insulin aspart (NOVOLOG FLEXPEN) 100 UNIT/ML FlexPen Inject up to 15 units prior to each meal per sliding scale  . insulin glargine (LANTUS) 100 UNIT/ML injection Inject 0.2 mLs (20 Units total) into the skin every morning.  Marland Kitchen lisinopril (PRINIVIL,ZESTRIL) 5 MG tablet Take 1 tablet (5 mg total) by mouth daily.  . meclizine (ANTIVERT) 25 MG tablet Take 1 tablet (25 mg total) by mouth 3 (three) times daily as needed for dizziness.  . metoprolol tartrate (LOPRESSOR) 25 MG tablet TAKE 1 TABLET TWICE DAILY  . Multiple Vitamin (MULTIVITAMIN) tablet Take 1 tablet by  mouth daily.  Marland Kitchen NITROSTAT 0.4 MG SL tablet Place 1 tablet (0.4 mg total) under the tongue every 5 (five) minutes as needed for chest pain.  . polyethylene glycol (MIRALAX / GLYCOLAX) packet Take 17 g by mouth every other day.  . potassium chloride SA (K-DUR,KLOR-CON) 20 MEQ tablet Take 1 tablet (20 mEq total) by mouth daily.  . Prenatal Vit-Fe Fumarate-FA (PRENATAL MULTIVITAMIN) TABS tablet Take 1 tablet by mouth daily at 12 noon.  . riboflavin (VITAMIN B-2) 100 MG TABS tablet Take 100 mg by mouth daily.  . vitamin B-12 (CYANOCOBALAMIN)  1000 MCG tablet Take 1 tablet (1,000 mcg total) by mouth daily.  . vitamin C (ASCORBIC ACID) 500 MG tablet Take 500 mg by mouth daily.  . [DISCONTINUED] ferrous sulfate 325 (65 FE) MG tablet Take 1 tablet (325 mg total) by mouth 2 (two) times daily with a meal.   No facility-administered encounter medications on file as of 10/13/2014.      Review of Systems  Constitutional: Positive for fatigue.  Respiratory: Negative.   Cardiovascular: Negative.   Neurological: Negative.   Psychiatric/Behavioral: Negative.        Objective:   Physical Exam  Constitutional: She is oriented to person, place, and time. She appears well-developed and well-nourished.  Cardiovascular: Normal rate and regular rhythm.   Pulmonary/Chest: Effort normal and breath sounds normal.  Neurological: She is alert and oriented to person, place, and time.  Psychiatric: She has a normal mood and affect.    BP 130/63 mmHg  Pulse 80  Temp(Src) 96.9 F (36.1 C) (Oral)  Ht 4' 11"  (1.499 m)  Wt 116 lb (52.617 kg)  BMI 23.42 kg/m2        Assessment & Plan:  1. Hyperglycemia ? Why sugars are elevated.  Has changed insulins recently.  No evidence of infection - POCT glucose (manual entry) - POCT urinalysis dipstick - POCT UA - Microscopic Only - POCT CBC - DG Chest 2 View; Future  2. Essential hypertension, benign BP well controlled  3. Diabetes type 2, uncontrolled Plan to titrate insulin to get sugars controlled.  Clinical pharm to help  Wardell Honour MD

## 2014-10-17 NOTE — Progress Notes (Signed)
Lmtcb, has 2 notes under result notes

## 2014-10-17 NOTE — Progress Notes (Signed)
Lmtcb, has 2results in result notes

## 2014-10-26 ENCOUNTER — Other Ambulatory Visit: Payer: Self-pay

## 2014-10-26 MED ORDER — GLUCOSE BLOOD VI STRP: ORAL_STRIP | Status: DC

## 2014-10-29 DIAGNOSIS — Z79899 Other long term (current) drug therapy: Secondary | ICD-10-CM | POA: Diagnosis not present

## 2014-10-29 DIAGNOSIS — Z794 Long term (current) use of insulin: Secondary | ICD-10-CM | POA: Diagnosis not present

## 2014-10-29 DIAGNOSIS — R112 Nausea with vomiting, unspecified: Secondary | ICD-10-CM | POA: Diagnosis not present

## 2014-10-29 DIAGNOSIS — I10 Essential (primary) hypertension: Secondary | ICD-10-CM | POA: Diagnosis not present

## 2014-10-29 DIAGNOSIS — K2900 Acute gastritis without bleeding: Secondary | ICD-10-CM | POA: Diagnosis not present

## 2014-10-29 DIAGNOSIS — R1032 Left lower quadrant pain: Secondary | ICD-10-CM | POA: Diagnosis not present

## 2014-10-29 DIAGNOSIS — Z7982 Long term (current) use of aspirin: Secondary | ICD-10-CM | POA: Diagnosis not present

## 2014-10-29 DIAGNOSIS — E119 Type 2 diabetes mellitus without complications: Secondary | ICD-10-CM | POA: Diagnosis not present

## 2014-10-29 DIAGNOSIS — R109 Unspecified abdominal pain: Secondary | ICD-10-CM | POA: Diagnosis not present

## 2014-10-29 DIAGNOSIS — R0789 Other chest pain: Secondary | ICD-10-CM | POA: Diagnosis not present

## 2014-10-29 DIAGNOSIS — K2970 Gastritis, unspecified, without bleeding: Secondary | ICD-10-CM | POA: Diagnosis not present

## 2014-10-30 ENCOUNTER — Ambulatory Visit (INDEPENDENT_AMBULATORY_CARE_PROVIDER_SITE_OTHER): Payer: Commercial Managed Care - HMO | Admitting: Nurse Practitioner

## 2014-10-30 VITALS — BP 132/60 | HR 107 | Temp 98.1°F | Ht 59.0 in | Wt 114.0 lb

## 2014-10-30 DIAGNOSIS — K529 Noninfective gastroenteritis and colitis, unspecified: Secondary | ICD-10-CM | POA: Diagnosis not present

## 2014-10-30 MED ORDER — ONDANSETRON 4 MG PO TBDP: 4.0000 mg | ORAL_TABLET | Freq: Three times a day (TID) | ORAL | Status: DC | PRN

## 2014-10-30 NOTE — Patient Instructions (Signed)

## 2014-10-30 NOTE — Progress Notes (Signed)
   Subjective:    Patient ID: Ellen Woods, female    DOB: 04/14/1941, 74 y.o.   MRN: [id number]  HPI Patient started vomiting Saturday evening- vomited all night and [facility]- SHe was dx with viral gastroenteritis- They gave meprazole and zofran. Since then she is still nauseated. SH ecannot keep zofran down long enough for it to work- Can't keep liquids down. Denies abdominal pain- No fever.    Review of Systems  Constitutional: Negative.   HENT: Negative.   Respiratory: Negative.   Cardiovascular: Negative.   Gastrointestinal: Negative.   Genitourinary: Negative.   Neurological: Negative.   Psychiatric/Behavioral: Negative.   All other systems reviewed and are negative.      Objective:   Physical Exam  Constitutional: She is oriented to person, place, and time. She appears well-developed and well-nourished.  Cardiovascular: Normal rate, regular rhythm and normal heart sounds.   Pulmonary/Chest: Effort normal and breath sounds normal.  Abdominal: Soft. Bowel sounds are normal. She exhibits no distension and no mass. There is no tenderness. There is no rebound and no guarding.  Neurological: She is alert and oriented to person, place, and time.  Skin: Skin is warm.  Psychiatric: She has a normal mood and affect. Her behavior is normal. Judgment and thought content normal.    BP 132/60 mmHg  Pulse 107  Temp(Src) 98.1 F (36.7 C) (Oral)  Ht 4\' 11"  (1.499 m)  Wt 114 lb (51.71 kg)  BMI 23.01 kg/m2       Assessment & Plan:  1. Noninfectious gastroenteritis, unspecified First 24 Hours-Clear liquids  popsicles  Jello  gatorade  Sprite Second 24 hours-Add Full liquids ( Liquids you cant see through) Third 24 hours- Bland diet ( foods that are baked or broiled)  *avoiding fried foods and highly spiced foods* During these 3 days  Avoid milk, cheese, ice cream or any other dairy products  Avoid caffeine- REMEMBER Mt. Dew and Mello  Yellow contain lots of caffeine You should eat and drink in  Frequent small volumes If no improvement in symptoms or worsen in 2-3 days should RETRUN TO OFFICE or go to ER!   Reviewed hospital records - ondansetron (ZOFRAN ODT) 4 MG disintegrating tablet; Take 1 tablet (4 mg total) by mouth every 8 (eight) hours as needed for nausea or vomiting.  Dispense: 20 tablet; Refill: 0  [name], FNP

## 2014-11-01 ENCOUNTER — Other Ambulatory Visit: Payer: Self-pay | Admitting: *Deleted

## 2014-11-01 ENCOUNTER — Telehealth: Payer: Self-pay | Admitting: Nurse Practitioner

## 2014-11-01 MED ORDER — GLUCOSE BLOOD VI STRP: ORAL_STRIP | Status: DC

## 2014-11-01 NOTE — Progress Notes (Signed)
[facility]

## 2014-12-27 ENCOUNTER — Encounter: Payer: Self-pay | Admitting: Family Medicine

## 2014-12-27 ENCOUNTER — Ambulatory Visit (INDEPENDENT_AMBULATORY_CARE_PROVIDER_SITE_OTHER): Payer: Commercial Managed Care - HMO | Admitting: Family Medicine

## 2014-12-27 VITALS — BP 149/64 | HR 82 | Temp 97.3°F | Ht 59.0 in | Wt 116.4 lb

## 2014-12-27 DIAGNOSIS — I10 Essential (primary) hypertension: Secondary | ICD-10-CM

## 2014-12-27 DIAGNOSIS — IMO0002 Reserved for concepts with insufficient information to code with codable children: Secondary | ICD-10-CM

## 2014-12-27 DIAGNOSIS — H918X9 Other specified hearing loss, unspecified ear: Secondary | ICD-10-CM | POA: Diagnosis not present

## 2014-12-27 DIAGNOSIS — E1165 Type 2 diabetes mellitus with hyperglycemia: Secondary | ICD-10-CM

## 2014-12-27 DIAGNOSIS — H6502 Acute serous otitis media, left ear: Secondary | ICD-10-CM

## 2014-12-27 DIAGNOSIS — H6120 Impacted cerumen, unspecified ear: Secondary | ICD-10-CM

## 2014-12-27 DIAGNOSIS — H6123 Impacted cerumen, bilateral: Secondary | ICD-10-CM | POA: Insufficient documentation

## 2014-12-27 MED ORDER — AMOXICILLIN-POT CLAVULANATE 875-125 MG PO TABS: 1.0000 | ORAL_TABLET | Freq: Two times a day (BID) | ORAL | Status: DC

## 2014-12-27 NOTE — Addendum Note (Signed)
Addended by: Mechele Claude on: 12/27/2014 03:20 PM   Modules accepted: Kipp Brood

## 2014-12-27 NOTE — Progress Notes (Signed)
Subjective:  Patient ID: Isabella Stalling, female    DOB: 13-May-1940  Age: 74 y.o. MRN: [id number]  CC: URI   HPI JHANAE JASKOWIAK presents for productive cough and pressure in the ears. Onset one day ago. She feels some upper respiratory and mid chest congestion as well. The cough has been moderately productive. She doesn't look at the sputum to tell color or consistency. She denies fever chills or sweats. No respiratory distress. No dyspnea.  Diabetic pt. Requests referral for annual eye exam.  History Sereniti has a past medical history of Other dyspnea and respiratory abnormality; Unspecified essential hypertension; Type II or unspecified type diabetes mellitus without mention of complication, not stated as uncontrolled; Coronary atherosclerosis of native coronary artery; Encounter for long-term (current) use of insulin; Other symptoms involving cardiovascular system; Pain in joint, pelvic region and thigh; Shortness of breath; Other chest pain; PDA (patent ductus arteriosus); Pericardial calcification; Thyroid disease; Arthritis; Back pain (1/14); Family history of anesthesia complication; and COPD (chronic obstructive pulmonary disease).   She has past surgical history that includes Incisional hernia repair; Abdominal hysterectomy; Tubal ligation; and Intramedullary (im) nail intertrochanteric (Left, 11/13/2012).   Her family history includes Stroke in her mother.She reports that she has never smoked. She has never used smokeless tobacco. She reports that she does not drink alcohol or use illicit drugs.  Outpatient Prescriptions Prior to Visit  Medication Sig Dispense Refill  . ACCU-CHEK FASTCLIX LANCETS MISC USE FOR FINGERSTICK BLOOD SUGAR TESTING SIX TIMES DAILY 612 each 1  . acetaminophen (TYLENOL) 325 MG tablet Take 2 tablets (650 mg total) by mouth every 6 (six) hours as needed. (Patient taking differently: Take 650 mg by mouth every 6 (six) hours as needed for moderate pain. )    .  Alcohol Swabs (B-D SINGLE USE SWABS REGULAR) PADS USE  6  TIMES  DAILY 600 each 2  . alendronate (FOSAMAX) 70 MG tablet TAKE 1 TABLET EVERY WEEK 12 tablet 2  . ALPRAZolam (XANAX) 0.5 MG tablet Take 1 tablet (0.5 mg total) by mouth 2 (two) times daily. 180 tablet 0  . aspirin 81 MG tablet Take 81 mg by mouth daily.    . B-D UF III MINI PEN NEEDLES 31G X 5 MM MISC USE WITH PENS - UP TO FOUR TIMES DAILY 360 each 2  . Blood Glucose Monitoring Suppl (ACCU-CHEK NANO SMARTVIEW) W/DEVICE KIT USE FOR FINGERSTICK BLOOD SUGAR TESTING SIX TIMES DAILY 1 kit 0  . CALCIUM-MAGNESIUM-ZINC PO Take 1 tablet by mouth daily.    . cetirizine (ZYRTEC) 10 MG tablet Take 1 tablet (10 mg total) by mouth daily. 90 tablet 4  . docusate sodium 100 MG CAPS Take 100 mg by mouth 2 (two) times daily. 60 capsule 0  . fluticasone (FLONASE) 50 MCG/ACT nasal spray Place 1 spray into both nostrils daily as needed for allergies.     . furosemide (LASIX) 20 MG tablet Take 1 tablet (20 mg total) by mouth daily. 90 tablet 1  . glucose blood (ACCU-CHEK SMARTVIEW) test strip USE FOR FINGERSTICK BLOOD SUGAR TESTING SIX TIMES DAILY 600 each 2  . insulin aspart (NOVOLOG FLEXPEN) 100 UNIT/ML FlexPen Inject up to 15 units prior to each meal per sliding scale 15 mL 1  . insulin glargine (LANTUS) 100 UNIT/ML injection Inject 0.2 mLs (20 Units total) into the skin every morning. 30 mL 5  . lisinopril (PRINIVIL,ZESTRIL) 5 MG tablet Take 1 tablet (5 mg total) by mouth daily. 90 tablet 1  .  meclizine (ANTIVERT) 25 MG tablet Take 1 tablet (25 mg total) by mouth 3 (three) times daily as needed for dizziness. 30 tablet 1  . metoprolol tartrate (LOPRESSOR) 25 MG tablet TAKE 1 TABLET TWICE DAILY 180 tablet 1  . Multiple Vitamin (MULTIVITAMIN) tablet Take 1 tablet by mouth daily.    Marland Kitchen NITROSTAT 0.4 MG SL tablet Place 1 tablet (0.4 mg total) under the tongue every 5 (five) minutes as needed for chest pain. 30 tablet 0  . ondansetron (ZOFRAN ODT) 4 MG  disintegrating tablet Take 1 tablet (4 mg total) by mouth every 8 (eight) hours as needed for nausea or vomiting. 20 tablet 0  . polyethylene glycol (MIRALAX / GLYCOLAX) packet Take 17 g by mouth every other day. 14 each 0  . potassium chloride SA (K-DUR,KLOR-CON) 20 MEQ tablet Take 1 tablet (20 mEq total) by mouth daily. 90 tablet 1  . Prenatal Vit-Fe Fumarate-FA (PRENATAL MULTIVITAMIN) TABS tablet Take 1 tablet by mouth daily at 12 noon.    . riboflavin (VITAMIN B-2) 100 MG TABS tablet Take 100 mg by mouth daily.    . vitamin C (ASCORBIC ACID) 500 MG tablet Take 500 mg by mouth daily.    . vitamin B-12 (CYANOCOBALAMIN) 1000 MCG tablet Take 1 tablet (1,000 mcg total) by mouth daily. (Patient not taking: Reported on 12/27/2014) 30 tablet 5   No facility-administered medications prior to visit.    ROS Review of Systems  Constitutional: Negative for fever, chills, activity change and appetite change.  HENT: Positive for congestion, ear pain, hearing loss (Since infection started only.), postnasal drip, rhinorrhea and sinus pressure. Negative for ear discharge, nosebleeds, sneezing and trouble swallowing.   Respiratory: Negative for chest tightness and shortness of breath.   Cardiovascular: Negative for chest pain and palpitations.  Skin: Negative for rash.    Objective:  BP 149/64 mmHg  Pulse 82  Temp(Src) 97.3 F (36.3 C) (Oral)  Ht 4' 11"  (1.499 m)  Wt 116 lb 6.4 oz (52.799 kg)  BMI 23.50 kg/m2  BP Readings from Last 3 Encounters:  12/27/14 149/64  10/30/14 132/60  10/13/14 130/63    Wt Readings from Last 3 Encounters:  12/27/14 116 lb 6.4 oz (52.799 kg)  10/30/14 114 lb (51.71 kg)  10/13/14 116 lb (52.617 kg)     Physical Exam  Constitutional: She appears well-developed and well-nourished.  HENT:  Head: Normocephalic and atraumatic.  Nose: Mucosal edema present. Right sinus exhibits no frontal sinus tenderness. Left sinus exhibits no frontal sinus tenderness.    Mouth/Throat: No oropharyngeal exudate or posterior oropharyngeal erythema.  Neck: Normal range of motion. Neck supple. No Brudzinski's sign noted. No thyromegaly present.  Cardiovascular: Normal rate and regular rhythm.   No murmur heard. Pulmonary/Chest: Effort normal and breath sounds normal. No respiratory distress.  Lymphadenopathy:       Head (right side): No preauricular adenopathy present.       Head (left side): No preauricular adenopathy present.    She has no cervical adenopathy.       Right cervical: No superficial cervical adenopathy present.      Left cervical: No superficial cervical adenopathy present.    Lab Results  Component Value Date   HGBA1C 8.8 07/21/2014   HGBA1C 8.0 04/10/2014   HGBA1C 7.7 01/18/2014    Lab Results  Component Value Date   WBC 4.9 10/13/2014   HGB 12.8 10/13/2014   HCT 40.6 10/13/2014   PLT 246 07/21/2014   GLUCOSE 305* 07/21/2014  CHOL 238* 07/21/2014   TRIG 148 07/21/2014   HDL 73 07/21/2014   LDLCALC 68 01/18/2014   ALT 16 07/21/2014   AST 21 07/21/2014   NA 137 07/21/2014   K 4.8 07/21/2014   CL 97 07/21/2014   CREATININE 0.90 07/21/2014   BUN 28* 07/21/2014   CO2 24 07/21/2014   TSH 3.800 03/14/2014   INR 1.01 11/12/2012   HGBA1C 8.8 07/21/2014    Ct Head Wo Contrast  03/14/2014   CLINICAL DATA:  74 year old diabetic female with confusion. Initial encounter.  EXAM: CT HEAD WITHOUT CONTRAST  TECHNIQUE: Contiguous axial images were obtained from the base of the skull through the vertex without intravenous contrast.  COMPARISON:  08/15/2003 MR.  No comparison CT.  FINDINGS: Exam is slightly motion degraded.  No intracranial hemorrhage.  Small vessel disease type changes without CT evidence of large acute infarct.  Atrophy without hydrocephalus.  No intracranial mass lesion noted on this unenhanced exam.  Vascular calcifications.  No intracranial mass lesion noted on this unenhanced exam.  IMPRESSION: Exam is slightly motion  degraded.  No intracranial hemorrhage.  Small vessel disease type changes without CT evidence of large acute infarct.  Atrophy without hydrocephalus.   Electronically Signed   By: Chauncey Cruel M.D.   On: 03/14/2014 13:10   Dg Chest Port 1 View  03/14/2014   CLINICAL DATA:  Altered mental status, hypoglycemia. COPD, diabetic.  EXAM: PORTABLE CHEST - 1 VIEW  COMPARISON:  11/12/2012 .  FINDINGS: Mild cardiomegaly. Diffuse interstitial prominence throughout the lungs has increased since prior study. This could reflect mild interstitial edema. No confluent opacities or effusions. No acute bony abnormality.  IMPRESSION: Mild diffuse interstitial prominence throughout the lungs, question interstitial edema.   Electronically Signed   By: Rolm Baptise M.D.   On: 03/14/2014 12:48   Ct Cta Abd/pel W/cm &/or W/o Cm  03/14/2014   CLINICAL DATA:  Abdominal pain. Elevated lactate. Rule out mesenteric ischemia.  EXAM: CTA ABDOMEN AND PELVIS wITHOUT AND WITH CONTRAST  TECHNIQUE: Multidetector CT imaging of the abdomen and pelvis was performed using the standard protocol during bolus administration of intravenous contrast. Multiplanar reconstructed images and MIPs were obtained and reviewed to evaluate the vascular anatomy.  CONTRAST:  42m OMNIPAQUE IOHEXOL 350 MG/ML SOLN  COMPARISON:  None.  FINDINGS: The aorta is heavily calcified. Superior mesenteric artery and celiac artery are widely patent without stenosis. Inferior mesenteric artery is stenotic at its origin but patent. Single left renal arteries widely patent. 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 The right renal artery arises from the aorta at the bifurcation without visible stenosis.  Calcifications within the liver and spleen compatible with old granulomatous disease. Gallbladder, pancreas, adrenals and left kidney are unremarkable. Again, right kidney is within the pelvis. Areas of cortical loss and scarring within the right kidney.  Appendix is  visualized and is normal. Stomach and small bowel decompressed, grossly unremarkable.  The rectum is dilated and stool filled. There is mild rectal wall thickening posteriorly and significant edema/ stranding in the presacral space and around the lateral walls of the rectum. Findings suggestive of proctitis. Probable fecal impaction as well. No free fluid, free air or adenopathy.  Prior hysterectomy. Urinary bladder is decompressed with Foley catheter in place. No acute bony abnormality or focal bone lesion.  Cardiomegaly. Calcifications within the posterior pericardium compatible with calcific pericarditis. Left lower lobe atelectasis or early infiltrate. Right lung is clear. No effusions.  Review of  the MIP images confirms the above findings.  IMPRESSION: Dilated stool-filled rectum suggesting fecal impaction. There is also mild wall thickening in the rectum and stranding in the perirectal soft tissues. Findings suggestive of proctitis.  Right pelvic kidney.  No evidence of bowel ischemia.  Old granulomas disease in the liver and spleen.  Calcific pericarditis.  Cardiomegaly.  Left lower lobe atelectasis or infiltrate.   Electronically Signed   By: Rolm Baptise M.D.   On: 03/14/2014 15:46    Assessment & Plan:   Markeisha was seen today for uri.  Diagnoses and all orders for this visit:  Acute serous otitis media of left ear, recurrence not specified  Essential hypertension, benign  Bilateral hearing loss due to cerumen impaction  Diabetes type 2, uncontrolled -     Ambulatory referral to Ophthalmology  Other orders -     amoxicillin-clavulanate (AUGMENTIN) 875-125 MG per tablet; Take 1 tablet by mouth 2 (two) times daily. Take all of this medication  I am having Ms. Bezold start on amoxicillin-clavulanate. I am also having her maintain her multivitamin, NITROSTAT, vitamin B-12, acetaminophen, fluticasone, riboflavin, CALCIUM-MAGNESIUM-ZINC PO, vitamin C, aspirin, alendronate, ACCU-CHEK FASTCLIX  LANCETS, meclizine, prenatal multivitamin, polyethylene glycol, DSS, ACCU-CHEK NANO SMARTVIEW, furosemide, insulin glargine, lisinopril, metoprolol tartrate, potassium chloride SA, cetirizine, ALPRAZolam, B-D UF III MINI PEN NEEDLES, B-D SINGLE USE SWABS REGULAR, insulin aspart, ondansetron, and glucose blood.  Meds ordered this encounter  Medications  . amoxicillin-clavulanate (AUGMENTIN) 875-125 MG per tablet    Sig: Take 1 tablet by mouth 2 (two) times daily. Take all of this medication    Dispense:  20 tablet    Refill:  0     Follow-up: No Follow-up on file.  Claretta Fraise, M.D.

## 2015-01-09 ENCOUNTER — Telehealth: Payer: Self-pay | Admitting: Nurse Practitioner

## 2015-01-09 ENCOUNTER — Encounter: Payer: Self-pay | Admitting: Family Medicine

## 2015-01-09 ENCOUNTER — Ambulatory Visit (INDEPENDENT_AMBULATORY_CARE_PROVIDER_SITE_OTHER): Payer: Commercial Managed Care - HMO | Admitting: Family Medicine

## 2015-01-09 VITALS — BP 126/67 | HR 74 | Temp 97.3°F | Ht 59.0 in | Wt 114.0 lb

## 2015-01-09 DIAGNOSIS — E785 Hyperlipidemia, unspecified: Secondary | ICD-10-CM | POA: Diagnosis not present

## 2015-01-09 DIAGNOSIS — IMO0002 Reserved for concepts with insufficient information to code with codable children: Secondary | ICD-10-CM

## 2015-01-09 DIAGNOSIS — E1165 Type 2 diabetes mellitus with hyperglycemia: Secondary | ICD-10-CM

## 2015-01-09 DIAGNOSIS — I480 Paroxysmal atrial fibrillation: Secondary | ICD-10-CM | POA: Diagnosis not present

## 2015-01-09 DIAGNOSIS — I10 Essential (primary) hypertension: Secondary | ICD-10-CM

## 2015-01-09 NOTE — Addendum Note (Signed)
Addended by: Gwenith Daily on: 01/09/2015 09:49 AM   Modules accepted: Orders

## 2015-01-09 NOTE — Telephone Encounter (Signed)
Patient has appointment today with Dr. Hyacinth Meeker for followup and lipid panel, she will see what labwork looks like first per patient.

## 2015-01-09 NOTE — Progress Notes (Signed)
Subjective:    Patient ID: Ellen Woods, female    DOB: Nov 25, 1940, 74 y.o.   MRN: [id number]  HPI 74 year old female who is here today to follow-up chronic problems including type 2 diabetes, paroxysmal atrial fibrillation, chronic diastolic heart failure, and hyperlipidemia. She was seen recently for upper respiratory problem that has now resolved. Regarding diabetes control has been less than adequate. Apparently she is on a sliding scale with NovoLog insulin but she admits that her diet is based on her whims rather than what she should her needs to eat. She denies any chest pain or shortness of breath. She denies dependent edema. She has not been weighing herself daily. She does not take Lasix on a daily basis. She does admit to some palpitations but notices that mostly at night  Patient Active Problem List   Diagnosis Date Noted  . Bilateral hearing loss due to cerumen impaction 12/27/2014  . Hypokalemia 07/21/2014  . Other dietary vitamin B12 deficiency anemia 07/21/2014  . Other seasonal allergic rhinitis 07/21/2014  . Thyroid activity decreased 07/21/2014  . Transaminitis   . Diabetes type 2, uncontrolled   . Acute on chronic renal failure   . Metabolic encephalopathy   . Constipation 03/15/2014  . Proctitis 03/14/2014  . SIRS (systemic inflammatory response syndrome) 03/14/2014  . Chronic diastolic heart failure 03/01/2535  . Osteoporosis, unspecified 10/25/2012  . PAF (paroxysmal atrial fibrillation) 03/15/2012  . Carotid bruit 03/15/2012  . Pericardial calcification   . PDA (patent ductus arteriosus)   . Coronary atherosclerosis of native coronary artery   . Hyperlipidemia with target LDL less than 100 03/18/2010  . Essential hypertension, benign 03/18/2010  . DYSPHAGIA 01/01/2010   Outpatient Encounter Prescriptions as of 01/09/2015  Medication Sig  . ACCU-CHEK FASTCLIX LANCETS MISC USE FOR FINGERSTICK BLOOD SUGAR TESTING SIX TIMES DAILY  . acetaminophen (TYLENOL)  325 MG tablet Take 2 tablets (650 mg total) by mouth every 6 (six) hours as needed. (Patient taking differently: Take 650 mg by mouth every 6 (six) hours as needed for moderate pain. )  . Alcohol Swabs (B-D SINGLE USE SWABS REGULAR) PADS USE  6  TIMES  DAILY  . alendronate (FOSAMAX) 70 MG tablet TAKE 1 TABLET EVERY WEEK  . ALPRAZolam (XANAX) 0.5 MG tablet Take 1 tablet (0.5 mg total) by mouth 2 (two) times daily.  Marland Kitchen aspirin 81 MG tablet Take 81 mg by mouth daily.  . B-D UF III MINI PEN NEEDLES 31G X 5 MM MISC USE WITH PENS - UP TO FOUR TIMES DAILY  . Blood Glucose Monitoring Suppl (ACCU-CHEK NANO SMARTVIEW) W/DEVICE KIT USE FOR FINGERSTICK BLOOD SUGAR TESTING SIX TIMES DAILY  . CALCIUM-MAGNESIUM-ZINC PO Take 1 tablet by mouth daily.  . cetirizine (ZYRTEC) 10 MG tablet Take 1 tablet (10 mg total) by mouth daily.  Marland Kitchen docusate sodium 100 MG CAPS Take 100 mg by mouth 2 (two) times daily.  . fluticasone (FLONASE) 50 MCG/ACT nasal spray Place 1 spray into both nostrils daily as needed for allergies.   . furosemide (LASIX) 20 MG tablet Take 1 tablet (20 mg total) by mouth daily.  Marland Kitchen glucose blood (ACCU-CHEK SMARTVIEW) test strip USE FOR FINGERSTICK BLOOD SUGAR TESTING SIX TIMES DAILY  . insulin aspart (NOVOLOG FLEXPEN) 100 UNIT/ML FlexPen Inject up to 15 units prior to each meal per sliding scale  . insulin glargine (LANTUS) 100 UNIT/ML injection Inject 0.2 mLs (20 Units total) into the skin every morning.  Marland Kitchen lisinopril (PRINIVIL,ZESTRIL) 5 MG tablet Take  1 tablet (5 mg total) by mouth daily.  . metoprolol tartrate (LOPRESSOR) 25 MG tablet TAKE 1 TABLET TWICE DAILY  . Multiple Vitamin (MULTIVITAMIN) tablet Take 1 tablet by mouth daily.  Marland Kitchen NITROSTAT 0.4 MG SL tablet Place 1 tablet (0.4 mg total) under the tongue every 5 (five) minutes as needed for chest pain.  Marland Kitchen ondansetron (ZOFRAN ODT) 4 MG disintegrating tablet Take 1 tablet (4 mg total) by mouth every 8 (eight) hours as needed for nausea or vomiting.    . polyethylene glycol (MIRALAX / GLYCOLAX) packet Take 17 g by mouth every other day.  . potassium chloride SA (K-DUR,KLOR-CON) 20 MEQ tablet Take 1 tablet (20 mEq total) by mouth daily.  . Prenatal Vit-Fe Fumarate-FA (PRENATAL MULTIVITAMIN) TABS tablet Take 1 tablet by mouth daily at 12 noon.  . riboflavin (VITAMIN B-2) 100 MG TABS tablet Take 100 mg by mouth daily.  . vitamin C (ASCORBIC ACID) 500 MG tablet Take 500 mg by mouth daily.  . meclizine (ANTIVERT) 25 MG tablet Take 1 tablet (25 mg total) by mouth 3 (three) times daily as needed for dizziness. (Patient not taking: Reported on 01/09/2015)  . [DISCONTINUED] amoxicillin-clavulanate (AUGMENTIN) 875-125 MG per tablet Take 1 tablet by mouth 2 (two) times daily. Take all of this medication  . [DISCONTINUED] vitamin B-12 (CYANOCOBALAMIN) 1000 MCG tablet Take 1 tablet (1,000 mcg total) by mouth daily. (Patient not taking: Reported on 12/27/2014)   No facility-administered encounter medications on file as of 01/09/2015.       Review of Systems  Constitutional: Negative.   Respiratory: Negative.   Cardiovascular: Negative.   Gastrointestinal: Negative.   Psychiatric/Behavioral: Negative.        Objective:   Physical Exam  Constitutional: She is oriented to person, place, and time. She appears well-developed and well-nourished.  Cardiovascular: Normal rate, regular rhythm and intact distal pulses.   Pulmonary/Chest: Effort normal and breath sounds normal.  Musculoskeletal: Normal range of motion.  Neurological: She is alert and oriented to person, place, and time.  Psychiatric: She has a normal mood and affect. Her behavior is normal.    BP 126/67 mmHg  Pulse 74  Temp(Src) 97.3 F (36.3 C) (Oral)  Ht _0  (1.499 m)  Wt 114 lb (51.71 kg)  BMI 23.01 kg/m2  SpO2 100%       Assessment & Plan:  1. Essential hypertension, benign Blood pressure is well controlled. No changes are needed - CMP14+EGFR  2. Diabetes type 2,  uncontrolled Diabetes has not been well controlled on insulin depending on her A1c today I expect changes will be made - POCT glycosylated hemoglobin (Hb A1C)  3. PAF (paroxysmal atrial fibrillation) She is not on rate controlling drug or anticoagulated  4. Hyperlipidemia with target LDL less than 100 LDL was at goal one year ago. Will repeat today  Wardell Honour MD - Lipid panel - CMP14+EGFR

## 2015-01-09 NOTE — Telephone Encounter (Signed)
Got a request from patients insurance stating that they thought she should be on a statin- I i am not mistaken patient has been on statin in the past and caused muscle aches so she refuses to take meds. Please ask he if she will agree to a statin? alsp let her know it will be time for a follow up in a week.

## 2015-01-23 ENCOUNTER — Other Ambulatory Visit: Payer: Self-pay | Admitting: Family Medicine

## 2015-01-24 ENCOUNTER — Encounter: Payer: Self-pay | Admitting: Family

## 2015-01-24 ENCOUNTER — Ambulatory Visit (INDEPENDENT_AMBULATORY_CARE_PROVIDER_SITE_OTHER): Payer: Commercial Managed Care - HMO | Admitting: Family

## 2015-01-24 ENCOUNTER — Ambulatory Visit (INDEPENDENT_AMBULATORY_CARE_PROVIDER_SITE_OTHER): Payer: Commercial Managed Care - HMO

## 2015-01-24 VITALS — BP 172/71 | HR 87 | Temp 97.0°F | Ht 59.0 in | Wt 115.0 lb

## 2015-01-24 DIAGNOSIS — W01198A Fall on same level from slipping, tripping and stumbling with subsequent striking against other object, initial encounter: Secondary | ICD-10-CM

## 2015-01-24 DIAGNOSIS — W19XXXA Unspecified fall, initial encounter: Secondary | ICD-10-CM

## 2015-01-24 DIAGNOSIS — E876 Hypokalemia: Secondary | ICD-10-CM

## 2015-01-24 DIAGNOSIS — M25561 Pain in right knee: Secondary | ICD-10-CM

## 2015-01-24 DIAGNOSIS — E1165 Type 2 diabetes mellitus with hyperglycemia: Secondary | ICD-10-CM

## 2015-01-24 DIAGNOSIS — E785 Hyperlipidemia, unspecified: Secondary | ICD-10-CM | POA: Diagnosis not present

## 2015-01-24 DIAGNOSIS — Y92099 Unspecified place in other non-institutional residence as the place of occurrence of the external cause: Secondary | ICD-10-CM

## 2015-01-24 DIAGNOSIS — IMO0002 Reserved for concepts with insufficient information to code with codable children: Secondary | ICD-10-CM

## 2015-01-24 DIAGNOSIS — I10 Essential (primary) hypertension: Secondary | ICD-10-CM | POA: Diagnosis not present

## 2015-01-24 DIAGNOSIS — S8001XA Contusion of right knee, initial encounter: Secondary | ICD-10-CM | POA: Diagnosis not present

## 2015-01-24 DIAGNOSIS — Y92009 Unspecified place in unspecified non-institutional (private) residence as the place of occurrence of the external cause: Secondary | ICD-10-CM

## 2015-01-24 LAB — POCT GLYCOSYLATED HEMOGLOBIN (HGB A1C): HEMOGLOBIN A1C: 8.6

## 2015-01-24 MED ORDER — TRAMADOL HCL 50 MG PO TABS: 50.0000 mg | ORAL_TABLET | Freq: Two times a day (BID) | ORAL | Status: DC | PRN

## 2015-01-24 NOTE — Progress Notes (Signed)
   Subjective:    Patient ID: Ellen Woods, female    DOB: Aug 04, 1940, 74 y.o.   MRN: [id number]  PT presents to the office for right knee pain. Pt states she fell Sunday in her home. Pt states she "tripped" over a rug. Pt does use a cane to walk with.  Knee Pain  The incident occurred 5 to 7 days ago (Sunday). The incident occurred at home. The injury mechanism was a fall. The pain is present in the right knee. The quality of the pain is described as aching and cramping. The pain is at a severity of 7/10. The pain is moderate. The pain has been constant since onset. Pertinent negatives include no inability to bear weight, numbness or tingling. She reports no foreign bodies present. The symptoms are aggravated by movement. She has tried acetaminophen, non-weight bearing and rest for the symptoms. The treatment provided moderate relief.      Review of Systems  Constitutional: Negative.   HENT: Negative.   Eyes: Negative.   Respiratory: Negative.  Negative for shortness of breath.   Cardiovascular: Negative.  Negative for palpitations.  Gastrointestinal: Negative.   Endocrine: Negative.   Genitourinary: Negative.   Musculoskeletal: Negative.   Neurological: Negative.  Negative for tingling, numbness and headaches.  Hematological: Negative.   Psychiatric/Behavioral: Negative.   All other systems reviewed and are negative.      Objective:   Physical Exam  Constitutional: She is oriented to person, place, and time. She appears well-developed and well-nourished. No distress.  HENT:  Head: Normocephalic and atraumatic.  Eyes: Pupils are equal, round, and reactive to light.  Neck: Normal range of motion. Neck supple. No thyromegaly present.  Cardiovascular: Normal rate, regular rhythm, normal heart sounds and intact distal pulses.   No murmur heard. Pulmonary/Chest: Effort normal and breath sounds normal. No respiratory distress. She has no wheezes.  Abdominal: Soft. Bowel sounds are  normal. She exhibits no distension. There is no tenderness.  Musculoskeletal: Normal range of motion. She exhibits edema (trace amt in right knee). She exhibits no tenderness.  Neurological: She is alert and oriented to person, place, and time. She has normal reflexes. No cranial nerve deficit.  Skin: Skin is warm and dry.  Psychiatric: She has a normal mood and affect. Her behavior is normal. Judgment and thought content normal.  Vitals reviewed.     BP 172/71 mmHg  Pulse 87  Temp(Src) 97 F (36.1 C) (Oral)  Ht  (1.499 m)  Wt 115 lb (52.164 kg)  BMI 23.21 kg/m2     Assessment & Plan:  1. Right knee pain - DG Knee 1-2 Views Right; Future  2. Knee contusion, right, initial encounter  3. Fall at home, initial encounter  Falls precaution discussed Remove all rugs from home Ice Tylenol prn for pain Keep elevated RTO prn   Jannifer Rodney, FNP

## 2015-01-24 NOTE — Patient Instructions (Addendum)
Contusion °A contusion is a deep bruise. Contusions happen when an injury causes bleeding under the skin. Signs of bruising include pain, puffiness (swelling), and discolored skin. The contusion may turn blue, purple, or yellow. °HOME CARE  °· Put ice on the injured area. °· Put ice in a plastic bag. °· Place a towel between your skin and the bag. °· Leave the ice on for 15-20 minutes, 03-04 times a day. °· Only take medicine as told by your doctor. °· Rest the injured area. °· If possible, raise (elevate) the injured area to lessen puffiness. °GET HELP RIGHT AWAY IF:  °· You have more bruising or puffiness. °· You have pain that is getting worse. °· Your puffiness or pain is not helped by medicine. °MAKE SURE YOU:  °· Understand these instructions. °· Will watch your condition. °· Will get help right away if you are not doing well or get worse. °Document Released: 10/08/2007 Document Revised: 07/14/2011 Document Reviewed: 02/24/2011 °Exit[employer]® Patient Information ©2015 [employer]. This information is not intended to replace advice given to you by your health care provider. Make sure you discuss any questions you have with your health care provider. ° °Fall Prevention and Home Safety °Falls cause injuries and can affect all age groups. It is possible to use preventive measures to significantly decrease the likelihood of falls. There are many simple measures which can make your home safer and prevent falls. °OUTDOORS °· Repair cracks and edges of walkways and driveways. °· Remove high doorway thresholds. °· Trim shrubbery on the main path into your home. °· Have good outside lighting. °· Clear walkways of tools, rocks, debris, and clutter. °· Check that handrails are not broken and are securely fastened. Both sides of steps should have handrails. °· Have leaves, snow, and ice cleared regularly. °· Use sand or salt on walkways during winter months. °· In the garage, clean up grease or oil  spills. °BATHROOM °· Install night lights. °· Install grab bars by the toilet and in the tub and shower. °· Use non-skid mats or decals in the tub or shower. °· Place a plastic non-slip stool in the shower to sit on, if needed. °· Keep floors dry and clean up all water on the floor immediately. °· Remove soap buildup in the tub or shower on a regular basis. °· Secure bath mats with non-slip, double-sided rug tape. °· Remove throw rugs and tripping hazards from the floors. °BEDROOMS °· Install night lights. °· Make sure a bedside light is easy to reach. °· Do not use oversized bedding. °· Keep a telephone by your bedside. °· Have a firm chair with side arms to use for getting dressed. °· Remove throw rugs and tripping hazards from the floor. °KITCHEN °· Keep handles on pots and pans turned toward the center of the stove. Use back burners when possible. °· Clean up spills quickly and allow time for drying. °· Avoid walking on wet floors. °· Avoid hot utensils and knives. °· Position shelves so they are not too high or low. °· Place commonly used objects within easy reach. °· If necessary, use a sturdy step stool with a grab bar when reaching. °· Keep electrical cables out of the way. °· Do not use floor polish or wax that makes floors slippery. If you must use wax, use non-skid floor wax. °· Remove throw rugs and tripping hazards from the floor. °STAIRWAYS °· Never leave objects on stairs. °· Place handrails on both sides of stairways and   use them. Fix any loose handrails. Make sure handrails on both sides of the stairways are as long as the stairs. °· Check carpeting to make sure it is firmly attached along stairs. Make repairs to worn or loose carpet promptly. °· Avoid placing throw rugs at the top or bottom of stairways, or properly secure the rug with carpet tape to prevent slippage. Get rid of throw rugs, if possible. °· Have an electrician put in a light switch at the top and bottom of the stairs. °OTHER FALL  PREVENTION TIPS °· Wear low-heel or rubber-soled shoes that are supportive and fit well. Wear closed toe shoes. °· When using a stepladder, make sure it is fully opened and both spreaders are firmly locked. Do not climb a closed stepladder. °· Add color or contrast paint or tape to grab bars and handrails in your home. Place contrasting color strips on first and last steps. °· Learn and use mobility aids as needed. Install an electrical emergency response system. °· Turn on lights to avoid dark areas. Replace light bulbs that burn out immediately. Get light switches that glow. °· Arrange furniture to create clear pathways. Keep furniture in the same place. °· Firmly attach carpet with non-skid or double-sided tape. °· Eliminate uneven floor surfaces. °· Select a carpet pattern that does not visually hide the edge of steps. °· Be aware of all pets. °OTHER HOME SAFETY TIPS °· Set the water temperature for 120° F (48.8° C). °· Keep emergency numbers on or near the telephone. °· Keep smoke detectors on every level of the home and near sleeping areas. °Document Released: 04/11/2002 Document Revised: 10/21/2011 Document Reviewed: 07/11/2011 °[employer]® Patient Information ©2015 [employer]. This information is not intended to replace advice given to you by your health care provider. Make sure you discuss any questions you have with your health care provider. ° °

## 2015-01-24 NOTE — Addendum Note (Signed)
Addended by: Jannifer Rodney A on: 01/24/2015 09:43 AM   Modules accepted: Orders

## 2015-01-25 LAB — CMP14+EGFR
ALBUMIN: 4.5 g/dL (ref 3.5–4.8)
ALT: 15 IU/L (ref 0–32)
AST: 18 IU/L (ref 0–40)
Albumin/Globulin Ratio: 2 (ref 1.1–2.5)
Alkaline Phosphatase: 68 IU/L (ref 39–117)
BUN/Creatinine Ratio: 24 (ref 11–26)
BUN: 20 mg/dL (ref 8–27)
Bilirubin Total: 0.8 mg/dL (ref 0.0–1.2)
CHLORIDE: 100 mmol/L (ref 97–108)
CO2: 25 mmol/L (ref 18–29)
CREATININE: 0.82 mg/dL (ref 0.57–1.00)
Calcium: 9.5 mg/dL (ref 8.7–10.3)
GFR calc Af Amer: 82 mL/min/{1.73_m2} (ref >59)
GFR calc non Af Amer: 71 mL/min/{1.73_m2} (ref >59)
GLUCOSE: 201 mg/dL — AB (ref 65–99)
Globulin, Total: 2.2 g/dL (ref 1.5–4.5)
Potassium: 4.8 mmol/L (ref 3.5–5.2)
Sodium: 142 mmol/L (ref 134–144)
Total Protein: 6.7 g/dL (ref 6.0–8.5)

## 2015-01-25 LAB — LIPID PANEL
CHOL/HDL RATIO: 3.4 ratio (ref 0.0–4.4)
Cholesterol, Total: 234 mg/dL — ABNORMAL HIGH (ref 100–199)
HDL: 69 mg/dL (ref >39)
LDL CALC: 136 mg/dL — AB (ref 0–99)
Triglycerides: 146 mg/dL (ref 0–149)
VLDL Cholesterol Cal: 29 mg/dL (ref 5–40)

## 2015-01-26 ENCOUNTER — Ambulatory Visit: Payer: Commercial Managed Care - HMO | Admitting: Cardiovascular Disease

## 2015-01-29 ENCOUNTER — Ambulatory Visit: Payer: Commercial Managed Care - HMO | Admitting: Cardiovascular Disease

## 2015-02-05 ENCOUNTER — Other Ambulatory Visit: Payer: Self-pay | Admitting: Family Medicine

## 2015-02-05 ENCOUNTER — Encounter: Payer: Self-pay | Admitting: Internal Medicine

## 2015-02-09 ENCOUNTER — Encounter: Payer: Self-pay | Admitting: Family

## 2015-02-09 ENCOUNTER — Ambulatory Visit (INDEPENDENT_AMBULATORY_CARE_PROVIDER_SITE_OTHER): Payer: Commercial Managed Care - HMO | Admitting: Family

## 2015-02-09 VITALS — BP 186/72 | HR 69 | Temp 98.4°F | Ht 59.0 in | Wt 119.8 lb

## 2015-02-09 DIAGNOSIS — I10 Essential (primary) hypertension: Secondary | ICD-10-CM

## 2015-02-09 DIAGNOSIS — Y92009 Unspecified place in unspecified non-institutional (private) residence as the place of occurrence of the external cause: Secondary | ICD-10-CM

## 2015-02-09 DIAGNOSIS — M25561 Pain in right knee: Secondary | ICD-10-CM

## 2015-02-09 DIAGNOSIS — W19XXXD Unspecified fall, subsequent encounter: Secondary | ICD-10-CM | POA: Diagnosis not present

## 2015-02-09 MED ORDER — HYDROCODONE-ACETAMINOPHEN 5-325 MG PO TABS: 1.0000 | ORAL_TABLET | Freq: Two times a day (BID) | ORAL | Status: DC | PRN

## 2015-02-09 MED ORDER — KETOROLAC TROMETHAMINE 30 MG/ML IJ SOLN
30.0000 mg | Freq: Once | INTRAMUSCULAR | Status: AC
  Administered 2015-02-09: 30 mg via INTRAMUSCULAR

## 2015-02-09 MED ORDER — LISINOPRIL 20 MG PO TABS
20.0000 mg | ORAL_TABLET | Freq: Every day | ORAL | Status: DC
Start: 2015-02-09 — End: 2015-11-26

## 2015-02-09 NOTE — Patient Instructions (Signed)

## 2015-02-09 NOTE — Progress Notes (Signed)
Subjective:    Patient ID: Ellen Woods, female    DOB: 1940/09/30, 74 y.o.   MRN: [id number]  Hypertension This is a chronic problem. The current episode started more than 1 year ago. The problem is uncontrolled. Associated symptoms include anxiety. Pertinent negatives include no headaches, palpitations, peripheral edema or shortness of breath. Past treatments include ACE inhibitors and beta blockers. The current treatment provides mild improvement. There is no history of kidney disease, CAD/MI, CVA, heart failure or a thyroid problem. There is no history of sleep apnea.  Knee Pain  The incident occurred more than 1 week ago. The injury mechanism was a fall (around 9/21). The pain is present in the right knee. The pain is at a severity of 8/10. The pain is mild. Pertinent negatives include no inability to bear weight, numbness or tingling. She reports no foreign bodies present. The symptoms are aggravated by weight bearing. She has tried ice and acetaminophen for the symptoms. The treatment provided mild relief.      Review of Systems  Constitutional: Negative.   HENT: Negative.   Eyes: Negative.   Respiratory: Negative.  Negative for shortness of breath.   Cardiovascular: Negative.  Negative for palpitations.  Gastrointestinal: Negative.   Endocrine: Negative.   Genitourinary: Negative.   Musculoskeletal: Negative.   Neurological: Negative.  Negative for tingling, numbness and headaches.  Hematological: Negative.   Psychiatric/Behavioral: Negative.   All other systems reviewed and are negative.      Objective:   Physical Exam  Constitutional: She is oriented to person, place, and time. She appears well-developed and well-nourished. No distress.  HENT:  Head: Normocephalic and atraumatic.  Eyes: Pupils are equal, round, and reactive to light.  Neck: Normal range of motion. Neck supple. No thyromegaly present.  Cardiovascular: Normal rate, regular rhythm, normal heart sounds  and intact distal pulses.   No murmur heard. Pulmonary/Chest: Effort normal and breath sounds normal. No respiratory distress. She has no wheezes.  Abdominal: Soft. Bowel sounds are normal. She exhibits no distension. There is no tenderness.  Musculoskeletal: Normal range of motion. She exhibits tenderness (mild tenderness of right knee with palpation ). She exhibits no edema.  Neurological: She is alert and oriented to person, place, and time. She has normal reflexes. No cranial nerve deficit.  Skin: Skin is warm and dry.  Psychiatric: She has a normal mood and affect. Her behavior is normal. Judgment and thought content normal.  Vitals reviewed.     BP 186/72 mmHg  Pulse 69  Temp(Src) 98.4 F (36.9 C) (Oral)  Ht _0  (1.499 m)  Wt 119 lb 12.8 oz (54.341 kg)  BMI 24.18 kg/m2     Assessment & Plan:  1. Essential hypertension -Pt's lisinopril increased to 20 mg from 5 mg today --Daily blood pressure log given with instructions on how to fill out and told to bring to next visit -Dash diet information given -Exercise encouraged - Stress Management  -Continue current meds -RTO in 2 weeks - lisinopril (PRINIVIL,ZESTRIL) 20 MG tablet; Take 1 tablet (20 mg total) by mouth daily.  Dispense: 90 tablet; Refill: 3 - BMP8+EGFR  2. Right knee pain -RICE RTO 2 weeks - ketorolac (TORADOL) 30 MG/ML injection 30 mg; Inject 1 mL (30 mg total) into the muscle once. - HYDROcodone-acetaminophen (NORCO/VICODIN) 5-325 MG tablet; Take 1 tablet by mouth every 12 (twelve) hours as needed for moderate pain.  Dispense: 60 tablet; Refill: 0 - BMP8+EGFR  3. Fall at home, subsequent encounter  Falls precaution discussed - ketorolac (TORADOL) 30 MG/ML injection 30 mg; Inject 1 mL (30 mg total) into the muscle once. - HYDROcodone-acetaminophen (NORCO/VICODIN) 5-325 MG tablet; Take 1 tablet by mouth every 12 (twelve) hours as needed for moderate pain.  Dispense: 60 tablet; Refill: 0 -  BMP8+EGFR  Evelina Dun, FNP

## 2015-02-10 LAB — BMP8+EGFR
BUN / CREAT RATIO: 24 (ref 11–26)
BUN: 22 mg/dL (ref 8–27)
CO2: 26 mmol/L (ref 18–29)
CREATININE: 0.92 mg/dL (ref 0.57–1.00)
Calcium: 9 mg/dL (ref 8.7–10.3)
Chloride: 102 mmol/L (ref 97–108)
GFR, EST AFRICAN AMERICAN: 71 mL/min/{1.73_m2} (ref >59)
GFR, EST NON AFRICAN AMERICAN: 62 mL/min/{1.73_m2} (ref >59)
Glucose: 163 mg/dL — ABNORMAL HIGH (ref 65–99)
Potassium: 4.4 mmol/L (ref 3.5–5.2)
SODIUM: 143 mmol/L (ref 134–144)

## 2015-02-12 ENCOUNTER — Other Ambulatory Visit: Payer: Commercial Managed Care - HMO

## 2015-02-12 ENCOUNTER — Telehealth: Payer: Self-pay | Admitting: Nurse Practitioner

## 2015-02-12 DIAGNOSIS — M810 Age-related osteoporosis without current pathological fracture: Secondary | ICD-10-CM

## 2015-02-12 NOTE — Telephone Encounter (Signed)
Ok to have bone density

## 2015-02-12 NOTE — Telephone Encounter (Signed)
I put the order in for the dexa, didn't know if there was anything else that needed to be done. If not then you can go ahead and close this call:)

## 2015-02-12 NOTE — Telephone Encounter (Signed)
Pt aware of appointment date/time 

## 2015-02-13 ENCOUNTER — Other Ambulatory Visit: Payer: Self-pay | Admitting: Nurse Practitioner

## 2015-02-13 NOTE — Telephone Encounter (Signed)
Message left.  Script is ready and at front desk.

## 2015-02-13 NOTE — Telephone Encounter (Signed)
RX ready for pick up 

## 2015-02-13 NOTE — Telephone Encounter (Signed)
Last seen 02/09/15  Chrsity If approved print for mail order

## 2015-02-20 ENCOUNTER — Other Ambulatory Visit: Payer: Self-pay | Admitting: Family

## 2015-02-21 ENCOUNTER — Encounter: Payer: Self-pay | Admitting: Cardiology

## 2015-02-21 ENCOUNTER — Ambulatory Visit: Payer: Commercial Managed Care - HMO | Admitting: *Deleted

## 2015-02-21 ENCOUNTER — Ambulatory Visit (INDEPENDENT_AMBULATORY_CARE_PROVIDER_SITE_OTHER): Payer: Commercial Managed Care - HMO | Admitting: Cardiology

## 2015-02-21 ENCOUNTER — Ambulatory Visit (INDEPENDENT_AMBULATORY_CARE_PROVIDER_SITE_OTHER): Payer: Commercial Managed Care - HMO

## 2015-02-21 VITALS — BP 150/70 | HR 90 | Ht 59.0 in | Wt 118.0 lb

## 2015-02-21 DIAGNOSIS — R002 Palpitations: Secondary | ICD-10-CM

## 2015-02-21 DIAGNOSIS — I10 Essential (primary) hypertension: Secondary | ICD-10-CM

## 2015-02-21 DIAGNOSIS — M810 Age-related osteoporosis without current pathological fracture: Secondary | ICD-10-CM | POA: Diagnosis not present

## 2015-02-21 NOTE — Progress Notes (Signed)
Cardiology Office Note   Date:  02/21/2015   ID:  Ellen Woods, DOB [birth year], MRN [id number]  PCP:  Evelina Dun, FNP  Cardiologist:   Minus Breeding, MD   Chief Complaint  Patient presents with  . Coronary Artery Disease      History of Present Illness: Ellen Woods is a 74 y.o. female who presents for follow-up of nonobstructive coronary disease an apparent atrial fibrillation. She lives here so she's switching her care here.   There is a distant history of atrial fibrillation although wasn't documented that I could find  Or evident on a Holter monitor in 2013. There is also description of the PDA and pericardial calcification.   There was evidence of a small PDA apparently on the most recent echo and I reviewed these results.   However, this has been managed clinically. She actually does quite well. She denies any ongoing chest pressure , neck or arm discomfort. She doesn't have any new shortness of breath , PND or orthopnea. However, she does have strong heart beating at night and occasionally feels like she has to roll over or take a Xanax to get rid of this. She does some activities in her yard without bringing on symptoms.   Past Medical History  Diagnosis Date  . Unspecified essential hypertension   . Type II or unspecified type diabetes mellitus without mention of complication, not stated as uncontrolled   . Coronary atherosclerosis of native coronary artery     50% LAD stenosis  . Encounter for long-term (current) use of insulin ([place])   . PDA (patent ductus arteriosus)     Small PDA documented by CT angiography, no pulmonary hypertension  . Pericardial calcification     Ruled out for restrictive pericarditis  . Thyroid disease   . Arthritis   . Back pain 1/14    Tx by orthopedics  . Family history of anesthesia complication     Daughter has nausea  . COPD (chronic obstructive pulmonary disease) [facility])     Past Surgical History  Procedure Laterality Date   . Incisional hernia repair    . Abdominal hysterectomy    . Tubal ligation    . Intramedullary (im) nail intertrochanteric Left 11/13/2012    Procedure: INTRAMEDULLARY (IM) NAIL INTERTROCHANTRIC LEFT HIP;  Surgeon: Hessie Dibble, MD;  Location: [place];  Service: Orthopedics;  Laterality: Left;     Current Outpatient Prescriptions  Medication Sig Dispense Refill  . ACCU-CHEK FASTCLIX LANCETS MISC USE FOR FINGERSTICK BLOOD SUGAR TESTING SIX TIMES DAILY 612 each 1  . acetaminophen (TYLENOL) 325 MG tablet Take 2 tablets (650 mg total) by mouth every 6 (six) hours as needed. (Patient taking differently: Take 650 mg by mouth every 6 (six) hours as needed for moderate pain. )    . Alcohol Swabs (B-D SINGLE USE SWABS REGULAR) PADS USE FOR FINGERSTICK BLOOD SUGAR TESTING SIX TIMES DAILY 600 each 2  . alendronate (FOSAMAX) 70 MG tablet TAKE 1 TABLET EVERY WEEK 12 tablet 1  . ALPRAZolam (XANAX) 0.5 MG tablet TAKE 1 TABLET TWICE DAILY 180 tablet 0  . aspirin 81 MG tablet Take 81 mg by mouth daily.    . B-D UF III MINI PEN NEEDLES 31G X 5 MM MISC USE WITH PENS - UP TO FOUR TIMES DAILY 360 each 2  . Blood Glucose Monitoring Suppl (ACCU-CHEK NANO SMARTVIEW) W/DEVICE KIT USE FOR FINGERSTICK BLOOD SUGAR TESTING SIX TIMES DAILY 1 kit 0  . cetirizine (  ZYRTEC) 10 MG tablet Take 1 tablet (10 mg total) by mouth daily. 90 tablet 4  . docusate sodium 100 MG CAPS Take 100 mg by mouth 2 (two) times daily. 60 capsule 0  . fluticasone (FLONASE) 50 MCG/ACT nasal spray Place 1 spray into both nostrils daily as needed for allergies.     . furosemide (LASIX) 20 MG tablet Take 1 tablet (20 mg total) by mouth daily. 90 tablet 1  . glucose blood (ACCU-CHEK SMARTVIEW) test strip USE FOR FINGERSTICK BLOOD SUGAR TESTING SIX TIMES DAILY 600 each 2  . HYDROcodone-acetaminophen (NORCO/VICODIN) 5-325 MG tablet Take 1 tablet by mouth every 12 (twelve) hours as needed for moderate pain. 60 tablet 0  . insulin aspart (NOVOLOG  FLEXPEN) 100 UNIT/ML FlexPen Inject up to 15 units prior to each meal per sliding scale 15 mL 1  . insulin glargine (LANTUS) 100 UNIT/ML injection Inject 0.2 mLs (20 Units total) into the skin every morning. 30 mL 5  . lisinopril (PRINIVIL,ZESTRIL) 20 MG tablet Take 1 tablet (20 mg total) by mouth daily. 90 tablet 3  . metoprolol tartrate (LOPRESSOR) 25 MG tablet TAKE 1 TABLET TWICE DAILY 180 tablet 1  . Multiple Vitamin (MULTIVITAMIN) tablet Take 1 tablet by mouth daily.    Marland Kitchen NITROSTAT 0.4 MG SL tablet Place 1 tablet (0.4 mg total) under the tongue every 5 (five) minutes as needed for chest pain. 30 tablet 0  . potassium chloride SA (K-DUR,KLOR-CON) 20 MEQ tablet Take 1 tablet (20 mEq total) by mouth daily. 90 tablet 1  . riboflavin (VITAMIN B-2) 100 MG TABS tablet Take 100 mg by mouth daily.    . vitamin C (ASCORBIC ACID) 500 MG tablet Take 500 mg by mouth daily.     No current facility-administered medications for this visit.    Allergies:   Azithromycin and Codeine    ROS:  Please see the history of present illness.   Otherwise, review of systems are positive for none.   All other systems are reviewed and negative.    PHYSICAL EXAM: VS:  BP 150/70 mmHg  Pulse 90  Ht 4' 11"  (1.499 m)  Wt 118 lb (53.524 kg)  BMI 23.82 kg/m2  SpO2 95% , BMI Body mass index is 23.82 kg/(m^2). GENERAL:  Well appearing HEENT:  Pupils equal round and reactive, fundi not visualized, oral mucosa unremarkable, edentulous NECK:  No jugular venous distention, waveform within normal limits, carotid upstroke brisk and symmetric, no bruits, no thyromegaly LYMPHATICS:  No cervical, inguinal adenopathy LUNGS:  Clear to auscultation bilaterally BACK:  No CVA tenderness CHEST:  Unremarkable HEART:  PMI not displaced or sustained,S1 and S2 within normal limits, no S3, no S4, no clicks, no rubs, no murmurs ABD:  Flat, positive bowel sounds normal in frequency in pitch, no bruits, no rebound, no guarding, no midline  pulsatile mass, no hepatomegaly, no splenomegaly EXT:  2 plus pulses throughout, no edema, no cyanosis no clubbing SKIN:  No rashes no nodules NEURO:  Cranial nerves II through XII grossly intact, motor grossly intact throughout PSYCH:  Cognitively intact, oriented to person place and time    EKG:  EKG is ordered today. The ekg ordered today demonstrates  Sinus rhythm, rate 90 , premature atrial contractions, RSR prime V1 and V2 , QTC slightly prolonged, nonspecific lateral T-wave changes.   Recent Labs: 03/14/2014: Pro B Natriuretic peptide (BNP) 661.6*; TSH 3.800 07/21/2014: Platelets 246 10/13/2014: Hemoglobin 12.8 01/24/2015: ALT 15 02/09/2015: BUN 22; Creatinine, Ser 0.92; Potassium 4.4;  Sodium 143    Lipid Panel    Component Value Date/Time   CHOL 234* 01/24/2015 0934   CHOL 238* 07/21/2014 1305   TRIG 146 01/24/2015 0934   TRIG 148 07/21/2014 1305   HDL 69 01/24/2015 0934   HDL 73 07/21/2014 1305   HDL 60 10/25/2012 0952   CHOLHDL 3.4 01/24/2015 0934   CHOLHDL 3.2 10/25/2012 0952   VLDL 18 10/25/2012 0952   LDLCALC 136* 01/24/2015 0934   LDLCALC 113* 10/25/2012 0952      Wt Readings from Last 3 Encounters:  02/21/15 118 lb (53.524 kg)  02/09/15 119 lb 12.8 oz (54.341 kg)  01/24/15 115 lb (52.164 kg)      Other studies Reviewed: Additional studies/ records that were reviewed today include: Echo. Review of the above records demonstrates:  Please see elsewhere in the note.     ASSESSMENT AND PLAN:  PALPITATIONS:   I will order a 24-hour Holter. She will also be back on the beta blocker as below. Further management will be based on this.  PDA:   I did review the echo. This has been clinically insignificant and no further imaging is indicated at this point.  PERICARDIAL CALCIFICATION:   This has been noted and there is been no evidence of restriction. No further evaluation is warranted.  HTN:   Her blood pressure is not actually control but it's better than it  was. She recently had her lisinopril increased. She was supposed to be taking the beta blocker and I restarted this. She will be following up Evelina Dun, FNP    Current medicines are reviewed at length with the patient today.  The patient does not have concerns regarding medicines.  The following changes have been made:  no change  Labs/ tests ordered today include:  Orders Placed This Encounter  Procedures  . EKG 12-Lead     Disposition:   FU with me in six months.     Signed, Minus Breeding, MD  02/21/2015 12:00 PM    [place]

## 2015-02-21 NOTE — Patient Instructions (Signed)
Medication Instructions:  The current medical regimen is effective;  continue present plan and medications.  Testing/Procedures: Your physician has recommended that you wear a holter monitor. Holter monitors are medical devices that record the heart's electrical activity. Doctors most often use these monitors to diagnose arrhythmias. Arrhythmias are problems with the speed or rhythm of the heartbeat. The monitor is a small, portable device. You can wear one while you do your normal daily activities. This is usually used to diagnose what is causing palpitations/syncope (passing out).  Follow-Up: Follow up in 6 months with Dr. Antoine PocheHochrein.  You will receive a letter in the mail 2 months before you are due.  Please call us when you receive this letter to schedule your follow up appointment.  Thank you for choosing [facility]!!

## 2015-02-21 NOTE — Progress Notes (Signed)
holter monitor placed

## 2015-02-22 DIAGNOSIS — E119 Type 2 diabetes mellitus without complications: Secondary | ICD-10-CM | POA: Diagnosis not present

## 2015-02-22 LAB — HM DIABETES EYE EXAM

## 2015-02-23 ENCOUNTER — Ambulatory Visit (INDEPENDENT_AMBULATORY_CARE_PROVIDER_SITE_OTHER): Payer: Commercial Managed Care - HMO | Admitting: Family

## 2015-02-23 ENCOUNTER — Encounter: Payer: Self-pay | Admitting: Family

## 2015-02-23 VITALS — BP 132/58 | HR 70 | Temp 98.2°F | Ht 59.0 in | Wt 117.6 lb

## 2015-02-23 DIAGNOSIS — I10 Essential (primary) hypertension: Secondary | ICD-10-CM

## 2015-02-23 DIAGNOSIS — R002 Palpitations: Secondary | ICD-10-CM | POA: Diagnosis not present

## 2015-02-23 DIAGNOSIS — Z23 Encounter for immunization: Secondary | ICD-10-CM | POA: Diagnosis not present

## 2015-02-23 MED ORDER — NITROSTAT 0.4 MG SL SUBL: 0.4000 mg | SUBLINGUAL_TABLET | SUBLINGUAL | Status: DC | PRN

## 2015-02-23 NOTE — Progress Notes (Signed)
   Subjective:    Patient ID: Ellen Woods, female    DOB: 25-Jan-1941, 74 y.o.   MRN: [id number]  Pt presents to the office today to recheck HTN. Pt's BP is at goal today! Hypertension This is a chronic problem. The current episode started more than 1 year ago. The problem has been resolved since onset. The problem is controlled. Associated symptoms include anxiety. Pertinent negatives include no headaches, palpitations, peripheral edema or shortness of breath. Risk factors for coronary artery disease include post-menopausal state and stress. Past treatments include ACE inhibitors, beta blockers and diuretics. The current treatment provides significant improvement. There is no history of kidney disease, CAD/MI, CVA, heart failure or a thyroid problem.      Review of Systems  Constitutional: Negative.   HENT: Negative.   Eyes: Negative.   Respiratory: Negative.  Negative for shortness of breath.   Cardiovascular: Negative.  Negative for palpitations.  Gastrointestinal: Negative.   Endocrine: Negative.   Genitourinary: Negative.   Musculoskeletal: Negative.   Neurological: Negative.  Negative for headaches.  Hematological: Negative.   Psychiatric/Behavioral: Negative.   All other systems reviewed and are negative.      Objective:   Physical Exam  Constitutional: She is oriented to person, place, and time. She appears well-developed and well-nourished. No distress.  HENT:  Head: Normocephalic and atraumatic.  Right Ear: External ear normal.  Left Ear: External ear normal.  Nose: Nose normal.  Mouth/Throat: Oropharynx is clear and moist.  Eyes: Pupils are equal, round, and reactive to light.  Neck: Normal range of motion. Neck supple. No thyromegaly present.  Cardiovascular: Normal rate, regular rhythm, normal heart sounds and intact distal pulses.   No murmur heard. Pulmonary/Chest: Effort normal and breath sounds normal. No respiratory distress. She has no wheezes.    Abdominal: Soft. Bowel sounds are normal. She exhibits no distension. There is no tenderness.  Musculoskeletal: Normal range of motion. She exhibits no edema or tenderness.  Neurological: She is alert and oriented to person, place, and time. She has normal reflexes. No cranial nerve deficit.  Skin: Skin is warm and dry.  Psychiatric: She has a normal mood and affect. Her behavior is normal. Judgment and thought content normal.  Vitals reviewed.   BP 132/58 mmHg  Pulse 70  Temp(Src) 98.2 F (36.8 C) (Oral)  Ht $R'4\' 11"'Me$  (1.499 m)  Wt 117 lb 9.6 oz (53.343 kg)  BMI 23.74 kg/m2       Assessment & Plan:  1. Essential hypertension, benign --Daily blood pressure log given with instructions on how to fill out and told to bring to next visit -Dash diet information given -Exercise encouraged - Stress Management  -Continue current meds -RTO in 3 months - BMP8+EGFR   Continue all meds Labs pending Health Maintenance reviewed Diet and exercise encouraged RTO 3 months  Evelina Dun, FNP

## 2015-02-23 NOTE — Patient Instructions (Signed)
DASH Eating Plan °DASH stands for "Dietary Approaches to Stop Hypertension." The DASH eating plan is a healthy eating plan that has been shown to reduce high blood pressure (hypertension). Additional health benefits may include reducing the risk of type 2 diabetes mellitus, heart disease, and stroke. The DASH eating plan may also help with weight loss. °WHAT DO I NEED TO KNOW ABOUT THE DASH EATING PLAN? °For the DASH eating plan, you will follow these general guidelines: °· Choose foods with a percent daily value for sodium of less than 5% (as listed on the food label). °· Use salt-free seasonings or herbs instead of table salt or sea salt. °· Check with your health care provider or pharmacist before using salt substitutes. °· Eat lower-sodium products, often labeled as "lower sodium" or "no salt added." °· Eat fresh foods. °· Eat more vegetables, fruits, and low-fat dairy products. °· Choose whole grains. Look for the word "whole" as the first word in the ingredient list. °· Choose fish and skinless chicken or turkey more often than red meat. Limit fish, poultry, and meat to 6 oz (170 g) each day. °· Limit sweets, desserts, sugars, and sugary drinks. °· Choose heart-healthy fats. °· Limit cheese to 1 oz (28 g) per day. °· Eat more home-cooked food and less restaurant, buffet, and fast food. °· Limit fried foods. °· Cook foods using methods other than frying. °· Limit canned vegetables. If you do use them, rinse them well to decrease the sodium. °· When eating at a restaurant, ask that your food be prepared with less salt, or no salt if possible. °WHAT FOODS CAN I EAT? °Seek help from a dietitian for individual calorie needs. °Grains °Whole grain or whole wheat bread. Brown rice. Whole grain or whole wheat pasta. Quinoa, bulgur, and whole grain cereals. Low-sodium cereals. Corn or whole wheat flour tortillas. Whole grain cornbread. Whole grain crackers. Low-sodium crackers. °Vegetables °Fresh or frozen vegetables  (raw, steamed, roasted, or grilled). Low-sodium or reduced-sodium tomato and vegetable juices. Low-sodium or reduced-sodium tomato sauce and paste. Low-sodium or reduced-sodium canned vegetables.  °Fruits °All fresh, canned (in natural juice), or frozen fruits. °Meat and Other Protein Products °Ground beef (85% or leaner), grass-fed beef, or beef trimmed of fat. Skinless chicken or turkey. Ground chicken or turkey. Pork trimmed of fat. All fish and seafood. Eggs. Dried beans, peas, or lentils. Unsalted nuts and seeds. Unsalted canned beans. °Dairy °Low-fat dairy products, such as skim or 1% milk, 2% or reduced-fat cheeses, low-fat ricotta or cottage cheese, or plain low-fat yogurt. Low-sodium or reduced-sodium cheeses. °Fats and Oils °Tub margarines without trans fats. Light or reduced-fat mayonnaise and salad dressings (reduced sodium). Avocado. Safflower, olive, or canola oils. Natural peanut or almond butter. °Other °Unsalted popcorn and pretzels. °The items listed above may not be a complete list of recommended foods or beverages. Contact your dietitian for more options. °WHAT FOODS ARE NOT RECOMMENDED? °Grains °White bread. White pasta. White rice. Refined cornbread. Bagels and croissants. Crackers that contain trans fat. °Vegetables °Creamed or fried vegetables. Vegetables in a cheese sauce. Regular canned vegetables. Regular canned tomato sauce and paste. Regular tomato and vegetable juices. °Fruits °Dried fruits. Canned fruit in light or heavy syrup. Fruit juice. °Meat and Other Protein Products °Fatty cuts of meat. Ribs, chicken wings, bacon, sausage, bologna, salami, chitterlings, fatback, hot dogs, bratwurst, and packaged luncheon meats. Salted nuts and seeds. Canned beans with salt. °Dairy °Whole or 2% milk, cream, half-and-half, and cream cheese. Whole-fat or sweetened yogurt. Full-fat   cheeses or blue cheese. Nondairy creamers and whipped toppings. Processed cheese, cheese spreads, or cheese  curds. °Condiments °Onion and garlic salt, seasoned salt, table salt, and sea salt. Canned and packaged gravies. Worcestershire sauce. Tartar sauce. Barbecue sauce. Teriyaki sauce. Soy sauce, including reduced sodium. Steak sauce. Fish sauce. Oyster sauce. Cocktail sauce. Horseradish. Ketchup and mustard. Meat flavorings and tenderizers. Bouillon cubes. Hot sauce. Tabasco sauce. Marinades. Taco seasonings. Relishes. °Fats and Oils °Butter, stick margarine, lard, shortening, ghee, and bacon fat. Coconut, palm kernel, or palm oils. Regular salad dressings. °Other °Pickles and olives. Salted popcorn and pretzels. °The items listed above may not be a complete list of foods and beverages to avoid. Contact your dietitian for more information. °WHERE CAN I FIND MORE INFORMATION? °National Heart, Lung, and [employer] Institute: www.nhlbi.nih.gov/health/health-topics/topics/dash/ °  °This information is not intended to replace advice given to you by your health care provider. Make sure you discuss any questions you have with your health care provider. °  °Document Released: 04/10/2011 Document Revised: 05/12/2014 Document Reviewed: 02/23/2013 °Elsevier Interactive Patient Education ©2016 [employer] ° °Hypertension °Hypertension, commonly called high blood pressure, is when the force of blood pumping through your arteries is too strong. Your arteries are the blood vessels that carry blood from your heart throughout your body. A blood pressure reading consists of a higher number over a lower number, such as 110/72. The higher number (systolic) is the pressure inside your arteries when your heart pumps. The lower number (diastolic) is the pressure inside your arteries when your heart relaxes. Ideally you want your blood pressure below 120/80. °Hypertension forces your heart to work harder to pump blood. Your arteries may become narrow or stiff. Having untreated or uncontrolled hypertension can cause heart attack, stroke, kidney  disease, and other problems. °RISK FACTORS °Some risk factors for high blood pressure are controllable. Others are not.  °Risk factors you cannot control include:  °· Race. You may be at higher risk if you are African American. °· Age. Risk increases with age. °· Gender. Men are at higher risk than women before age 45 years. After age 65, women are at higher risk than men. °Risk factors you can control include: °· Not getting enough exercise or physical activity. °· Being overweight. °· Getting too much fat, sugar, calories, or salt in your diet. °· Drinking too much alcohol. °SIGNS AND SYMPTOMS °Hypertension does not usually cause signs or symptoms. Extremely high blood pressure (hypertensive crisis) may cause headache, anxiety, shortness of breath, and nosebleed. °DIAGNOSIS °To check if you have hypertension, your health care provider will measure your blood pressure while you are seated, with your arm held at the level of your heart. It should be measured at least twice using the same arm. Certain conditions can cause a difference in blood pressure between your right and left arms. A blood pressure reading that is higher than normal on one occasion does not mean that you need treatment. If it is not clear whether you have high blood pressure, you may be asked to return on a different day to have your blood pressure checked again. Or, you may be asked to monitor your blood pressure at home for 1 or more weeks. °TREATMENT °Treating high blood pressure includes making lifestyle changes and possibly taking medicine. Living a healthy lifestyle can help lower high blood pressure. You may need to change some of your habits. °Lifestyle changes may include: °· Following the DASH diet. This diet is high in fruits, vegetables, and whole   grains. It is low in salt, red meat, and added sugars. °· Keep your sodium intake below 2,300 mg per day. °· Getting at least 30-45 minutes of aerobic exercise at least 4 times per  week. °· Losing weight if necessary. °· Not smoking. °· Limiting alcoholic beverages. °· Learning ways to reduce stress. °Your health care provider may prescribe medicine if lifestyle changes are not enough to get your blood pressure under control, and if one of the following is true: °· You are 18-59 years of age and your systolic blood pressure is above 140. °· You are 60 years of age or older, and your systolic blood pressure is above 150. °· Your diastolic blood pressure is above 90. °· You have diabetes, and your systolic blood pressure is over 140 or your diastolic blood pressure is over 90. °· You have kidney disease and your blood pressure is above 140/90. °· You have heart disease and your blood pressure is above 140/90. °Your personal target blood pressure may vary depending on your medical conditions, your age, and other factors. °HOME CARE INSTRUCTIONS °· Have your blood pressure rechecked as directed by your health care provider.   °· Take medicines only as directed by your health care provider. Follow the directions carefully. Blood pressure medicines must be taken as prescribed. The medicine does not work as well when you skip doses. Skipping doses also puts you at risk for problems. °· Do not smoke.   °· Monitor your blood pressure at home as directed by your health care provider.  °SEEK MEDICAL CARE IF:  °· You think you are having a reaction to medicines taken. °· You have recurrent headaches or feel dizzy. °· You have swelling in your ankles. °· You have trouble with your vision. °SEEK IMMEDIATE MEDICAL CARE IF: °· You develop a severe headache or confusion. °· You have unusual weakness, numbness, or feel faint. °· You have severe chest or abdominal pain. °· You vomit repeatedly. °· You have trouble breathing. °MAKE SURE YOU:  °· Understand these instructions. °· Will watch your condition. °· Will get help right away if you are not doing well or get worse. °  °This information is not intended to  replace advice given to you by your health care provider. Make sure you discuss any questions you have with your health care provider. °  °Document Released: 04/21/2005 Document Revised: 09/05/2014 Document Reviewed: 02/11/2013 °Elsevier Interactive Patient Education ©2016 [employer] Inc. ° °

## 2015-02-23 NOTE — Addendum Note (Signed)
Addended by: Jannifer RodneyHAWKS, CHRISTY A on: 02/23/2015 10:15 AM   Modules accepted: Orders

## 2015-02-26 ENCOUNTER — Other Ambulatory Visit: Payer: Self-pay | Admitting: *Deleted

## 2015-02-26 DIAGNOSIS — R002 Palpitations: Secondary | ICD-10-CM

## 2015-03-07 ENCOUNTER — Ambulatory Visit (INDEPENDENT_AMBULATORY_CARE_PROVIDER_SITE_OTHER): Payer: Commercial Managed Care - HMO | Admitting: Family Medicine

## 2015-03-07 ENCOUNTER — Encounter: Payer: Self-pay | Admitting: Family Medicine

## 2015-03-07 VITALS — BP 130/66 | HR 77 | Temp 97.9°F | Ht 59.0 in | Wt 115.6 lb

## 2015-03-07 DIAGNOSIS — B351 Tinea unguium: Secondary | ICD-10-CM

## 2015-03-07 DIAGNOSIS — B079 Viral wart, unspecified: Secondary | ICD-10-CM

## 2015-03-07 MED ORDER — CICLOPIROX 8 % EX SOLN: Freq: Every day | CUTANEOUS | Status: DC

## 2015-03-07 NOTE — Progress Notes (Signed)
Subjective:  Patient ID: Ellen Woods, female    DOB: [birth year]  Age: 74 y.o. MRN: [id number]  CC: Nevus and Nail Problem   HPI Ellen Woods presents for Toenails discolored and mattered. Worst at left first and right 3rd toes. Unsightly, but also painful. Has a lesion behind the right knee at the upper calf that enlarging. Would like to have it evaluated.  History Ellen Woods has a past medical history of Unspecified essential hypertension; Type II or unspecified type diabetes mellitus without mention of complication, not stated as uncontrolled; Coronary atherosclerosis of native coronary artery; Encounter for long-term (current) use of insulin ([place]); PDA (patent ductus arteriosus); Pericardial calcification; Thyroid disease; Arthritis; Back pain (1/14); Family history of anesthesia complication; and COPD (chronic obstructive pulmonary disease) ([place]).   She has past surgical history that includes Incisional hernia repair; Abdominal hysterectomy; Tubal ligation; and Intramedullary (im) nail intertrochanteric (Left, 11/13/2012).   Her family history includes Stroke in her mother.She reports that she has never smoked. She has never used smokeless tobacco. She reports that she does not drink alcohol or use illicit drugs.  Outpatient Prescriptions Prior to Visit  Medication Sig Dispense Refill  . ACCU-CHEK FASTCLIX LANCETS MISC USE FOR FINGERSTICK BLOOD SUGAR TESTING SIX TIMES DAILY 612 each 1  . acetaminophen (TYLENOL) 325 MG tablet Take 2 tablets (650 mg total) by mouth every 6 (six) hours as needed. (Patient taking differently: Take 650 mg by mouth every 6 (six) hours as needed for moderate pain. )    . Alcohol Swabs (B-D SINGLE USE SWABS REGULAR) PADS USE FOR FINGERSTICK BLOOD SUGAR TESTING SIX TIMES DAILY 600 each 2  . alendronate (FOSAMAX) 70 MG tablet TAKE 1 TABLET EVERY WEEK 12 tablet 1  . ALPRAZolam (XANAX) 0.5 MG tablet TAKE 1 TABLET TWICE DAILY 180 tablet 0  . aspirin 81 MG tablet  Take 81 mg by mouth daily.    . B-D UF III MINI PEN NEEDLES 31G X 5 MM MISC USE WITH PENS - UP TO FOUR TIMES DAILY 360 each 2  . Blood Glucose Monitoring Suppl (ACCU-CHEK NANO SMARTVIEW) W/DEVICE KIT USE FOR FINGERSTICK BLOOD SUGAR TESTING SIX TIMES DAILY 1 kit 0  . cetirizine (ZYRTEC) 10 MG tablet Take 1 tablet (10 mg total) by mouth daily. 90 tablet 4  . docusate sodium 100 MG CAPS Take 100 mg by mouth 2 (two) times daily. 60 capsule 0  . fluticasone (FLONASE) 50 MCG/ACT nasal spray Place 1 spray into both nostrils daily as needed for allergies.     . furosemide (LASIX) 20 MG tablet Take 1 tablet (20 mg total) by mouth daily. 90 tablet 1  . glucose blood (ACCU-CHEK SMARTVIEW) test strip USE FOR FINGERSTICK BLOOD SUGAR TESTING SIX TIMES DAILY 600 each 2  . insulin aspart (NOVOLOG FLEXPEN) 100 UNIT/ML FlexPen Inject up to 15 units prior to each meal per sliding scale 15 mL 1  . insulin glargine (LANTUS) 100 UNIT/ML injection Inject 0.2 mLs (20 Units total) into the skin every morning. 30 mL 5  . lisinopril (PRINIVIL,ZESTRIL) 20 MG tablet Take 1 tablet (20 mg total) by mouth daily. 90 tablet 3  . metoprolol tartrate (LOPRESSOR) 25 MG tablet TAKE 1 TABLET TWICE DAILY 180 tablet 1  . Multiple Vitamin (MULTIVITAMIN) tablet Take 1 tablet by mouth daily.    Marland Kitchen NITROSTAT 0.4 MG SL tablet Place 1 tablet (0.4 mg total) under the tongue every 5 (five) minutes as needed for chest pain. 20 tablet 0  .  potassium chloride SA (K-DUR,KLOR-CON) 20 MEQ tablet Take 1 tablet (20 mEq total) by mouth daily. 90 tablet 1  . HYDROcodone-acetaminophen (NORCO/VICODIN) 5-325 MG tablet Take 1 tablet by mouth every 12 (twelve) hours as needed for moderate pain. (Patient not taking: Reported on 03/07/2015) 60 tablet 0   No facility-administered medications prior to visit.    ROS Review of Systems  Constitutional: Negative for fever, chills, diaphoresis, appetite change and fatigue.  HENT: Negative for congestion, ear pain,  hearing loss, postnasal drip, rhinorrhea, sore throat and trouble swallowing.   Respiratory: Negative for cough, chest tightness and shortness of breath.   Cardiovascular: Negative for chest pain and palpitations.  Skin: Negative for rash.    Objective:  BP 130/66 mmHg  Pulse 77  Temp(Src) 97.9 F (36.6 C) (Oral)  Ht _0  (1.499 m)  Wt 115 lb 9.6 oz (52.436 kg)  BMI 23.34 kg/m2  SpO2 99%  BP Readings from Last 3 Encounters:  03/07/15 130/66  02/23/15 132/58  02/21/15 150/70    Wt Readings from Last 3 Encounters:  03/07/15 115 lb 9.6 oz (52.436 kg)  02/23/15 117 lb 9.6 oz (53.343 kg)  02/21/15 118 lb (53.524 kg)     Physical Exam  Constitutional: She is oriented to person, place, and time. She appears well-developed and well-nourished. No distress.  HENT:  Head: Normocephalic and atraumatic.  Eyes: EOM are normal. Pupils are equal, round, and reactive to light.  Neck: Normal range of motion. Neck supple.  Cardiovascular: Normal rate, regular rhythm and normal heart sounds.   No murmur heard. Pulmonary/Chest: Effort normal and breath sounds normal. No respiratory distress. She has no wheezes. She has no rales.  Neurological: She is alert and oriented to person, place, and time. She has normal reflexes.  Skin: Skin is warm and dry.  The toenails have moderate yellow discoloration with some subungual onycholysis change this is most marked at the left first nail. Some noted on the right and particularly the right third nail.  There is a typical 5 mm verrucca in the proximal region of the posterior right calf just distal to the popliteal fossa. This was treated with liquid nitrogen cryotherapy for 40 seconds.  Psychiatric: She has a normal mood and affect. Her behavior is normal. Judgment and thought content normal.    Lab Results  Component Value Date   HGBA1C 8.6 01/24/2015   HGBA1C 8.8 07/21/2014   HGBA1C 8.0 04/10/2014    Lab Results  Component Value Date   WBC  4.9 10/13/2014   HGB 12.8 10/13/2014   HCT 40.6 10/13/2014   PLT 246 07/21/2014   GLUCOSE 163* 02/09/2015   CHOL 234* 01/24/2015   TRIG 146 01/24/2015   HDL 69 01/24/2015   LDLCALC 136* 01/24/2015   ALT 15 01/24/2015   AST 18 01/24/2015   NA 143 02/09/2015   K 4.4 02/09/2015   CL 102 02/09/2015   CREATININE 0.92 02/09/2015   BUN 22 02/09/2015   CO2 26 02/09/2015   TSH 3.800 03/14/2014   INR 1.01 11/12/2012   HGBA1C 8.6 01/24/2015    Ct Head Wo Contrast  03/14/2014  CLINICAL DATA:  74 year old diabetic female with confusion. Initial encounter. EXAM: CT HEAD WITHOUT CONTRAST TECHNIQUE: Contiguous axial images were obtained from the base of the skull through the vertex without intravenous contrast. COMPARISON:  08/15/2003 MR.  No comparison CT. FINDINGS: Exam is slightly motion degraded. No intracranial hemorrhage. Small vessel disease type changes without CT evidence of large acute  infarct. Atrophy without hydrocephalus. No intracranial mass lesion noted on this unenhanced exam. Vascular calcifications. No intracranial mass lesion noted on this unenhanced exam. IMPRESSION: Exam is slightly motion degraded. No intracranial hemorrhage. Small vessel disease type changes without CT evidence of large acute infarct. Atrophy without hydrocephalus. Electronically Signed   By: Chauncey Cruel M.D.   On: 03/14/2014 13:10   Dg Chest Port 1 View  03/14/2014  CLINICAL DATA:  Altered mental status, hypoglycemia. COPD, diabetic. EXAM: PORTABLE CHEST - 1 VIEW COMPARISON:  11/12/2012 . FINDINGS: Mild cardiomegaly. Diffuse interstitial prominence throughout the lungs has increased since prior study. This could reflect mild interstitial edema. No confluent opacities or effusions. No acute bony abnormality. IMPRESSION: Mild diffuse interstitial prominence throughout the lungs, question interstitial edema. Electronically Signed   By: Rolm Baptise M.D.   On: 03/14/2014 12:48   Ct Cta Abd/pel W/cm &/or W/o  Cm  03/14/2014  CLINICAL DATA:  Abdominal pain. Elevated lactate. Rule out mesenteric ischemia. EXAM: CTA ABDOMEN AND PELVIS wITHOUT AND WITH CONTRAST TECHNIQUE: Multidetector CT imaging of the abdomen and pelvis was performed using the standard protocol during bolus administration of intravenous contrast. Multiplanar reconstructed images and MIPs were obtained and reviewed to evaluate the vascular anatomy. CONTRAST:  48m OMNIPAQUE IOHEXOL 350 MG/ML SOLN COMPARISON:  None. FINDINGS: The aorta is heavily calcified. Superior mesenteric artery and celiac artery are widely patent without stenosis. Inferior mesenteric artery is stenotic at its origin but patent. Single left renal arteries widely patent. 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 The right renal artery arises from the aorta at the bifurcation without visible stenosis. Calcifications within the liver and spleen compatible with old granulomatous disease. Gallbladder, pancreas, adrenals and left kidney are unremarkable. Again, right kidney is within the pelvis. Areas of cortical loss and scarring within the right kidney. Appendix is visualized and is normal. Stomach and small bowel decompressed, grossly unremarkable. The rectum is dilated and stool filled. There is mild rectal wall thickening posteriorly and significant edema/ stranding in the presacral space and around the lateral walls of the rectum. Findings suggestive of proctitis. Probable fecal impaction as well. No free fluid, free air or adenopathy. Prior hysterectomy. Urinary bladder is decompressed with Foley catheter in place. No acute bony abnormality or focal bone lesion. Cardiomegaly. Calcifications within the posterior pericardium compatible with calcific pericarditis. Left lower lobe atelectasis or early infiltrate. Right lung is clear. No effusions. Review of the MIP images confirms the above findings. IMPRESSION: Dilated stool-filled rectum suggesting fecal impaction. There is  also mild wall thickening in the rectum and stranding in the perirectal soft tissues. Findings suggestive of proctitis. Right pelvic kidney. No evidence of bowel ischemia. Old granulomas disease in the liver and spleen. Calcific pericarditis.  Cardiomegaly. Left lower lobe atelectasis or infiltrate. Electronically Signed   By: KRolm BaptiseM.D.   On: 03/14/2014 15:46    Assessment & Plan:   GMatheawas seen today for nevus and nail problem.  Diagnoses and all orders for this visit:  Onychomycosis due to dermatophyte  Wart  Other orders -     ciclopirox (PENLAC) 8 % solution; Apply topically at bedtime. Apply over nail and surrounding skin. Apply daily over previous coat. After seven (7) days, may remove with alcohol and continue cycle.   I have discontinued Ms. Majerus's HYDROcodone-acetaminophen. I am also having her start on ciclopirox. Additionally, I am having her maintain her multivitamin, acetaminophen, fluticasone, aspirin, ACCU-CHEK FASTCLIX LANCETS, DSS, ACCU-CHEK NANO SMARTVIEW, furosemide,  insulin glargine, metoprolol tartrate, potassium chloride SA, cetirizine, B-D UF III MINI PEN NEEDLES, insulin aspart, glucose blood, B-D SINGLE USE SWABS REGULAR, alendronate, lisinopril, ALPRAZolam, and NITROSTAT.  Meds ordered this encounter  Medications  . ciclopirox (PENLAC) 8 % solution    Sig: Apply topically at bedtime. Apply over nail and surrounding skin. Apply daily over previous coat. After seven (7) days, may remove with alcohol and continue cycle.    Dispense:  6.6 mL    Refill:  0   Liquid nitrogen cryotherapy to wart  Follow-up: Return if symptoms worsen or fail to improve.  Claretta Fraise, M.D.

## 2015-03-22 ENCOUNTER — Telehealth: Payer: Self-pay | Admitting: Cardiology

## 2015-03-22 NOTE — Telephone Encounter (Signed)
Follow Up  Pt returned call for monitor results. Please call

## 2015-03-22 NOTE — Telephone Encounter (Signed)
SPOKE TO PATIENT RESULT GIVEN ABOUT HER HOLTER MONITOR  NEXT RECALL APPOINTMENT IN 08/2015 PATIENT VERBALIZED UNDERSTANDNIG.

## 2015-04-02 ENCOUNTER — Telehealth: Payer: Self-pay | Admitting: Family

## 2015-04-02 MED ORDER — INSULIN GLARGINE 100 UNIT/ML ~~LOC~~ SOLN: 20.0000 [IU] | Freq: Every morning | SUBCUTANEOUS | Status: DC

## 2015-04-02 NOTE — Telephone Encounter (Signed)
done

## 2015-04-12 ENCOUNTER — Other Ambulatory Visit: Payer: Self-pay | Admitting: Nurse Practitioner

## 2015-04-18 ENCOUNTER — Encounter: Payer: Self-pay | Admitting: *Deleted

## 2015-04-27 ENCOUNTER — Ambulatory Visit (INDEPENDENT_AMBULATORY_CARE_PROVIDER_SITE_OTHER): Payer: Commercial Managed Care - HMO | Admitting: Family

## 2015-04-27 ENCOUNTER — Encounter: Payer: Self-pay | Admitting: Family

## 2015-04-27 VITALS — BP 161/74 | HR 59 | Temp 96.9°F | Ht 59.0 in | Wt 121.0 lb

## 2015-04-27 DIAGNOSIS — J309 Allergic rhinitis, unspecified: Secondary | ICD-10-CM

## 2015-04-27 MED ORDER — MOMETASONE FUROATE 50 MCG/ACT NA SUSP: 2.0000 | Freq: Every day | NASAL | Status: DC

## 2015-04-27 MED ORDER — MONTELUKAST SODIUM 10 MG PO TABS: 10.0000 mg | ORAL_TABLET | Freq: Every day | ORAL | Status: DC

## 2015-04-27 NOTE — Progress Notes (Signed)
   Subjective:    Patient ID: Ellen Woods, female    DOB: 06/20/1940, 74 y.o.   MRN: [id number]  Pt states she has had sinus problems "all winter". Pt was prescribed zyrtec and flonase, but states she has not started these prescriptions. PT states she did get the flonase, but has not started it yet. PT believes she started taking a "pink pill" for her allergies. (Benadryl?). Sinus Problem This is a new problem. The current episode started 1 to 4 weeks ago. The problem has been waxing and waning since onset. There has been no fever. Her pain is at a severity of 0/10. She is experiencing no pain. Associated symptoms include congestion. Pertinent negatives include no chills, coughing, ear pain, headaches, hoarse voice, shortness of breath, sinus pressure or sore throat. Treatments tried: benadryl  The treatment provided mild relief.      Review of Systems  Constitutional: Negative.  Negative for chills.  HENT: Positive for congestion. Negative for ear pain, hoarse voice, sinus pressure and sore throat.   Eyes: Negative.   Respiratory: Negative.  Negative for cough and shortness of breath.   Cardiovascular: Negative.  Negative for palpitations.  Gastrointestinal: Negative.   Endocrine: Negative.   Genitourinary: Negative.   Musculoskeletal: Negative.   Neurological: Negative.  Negative for headaches.  Hematological: Negative.   Psychiatric/Behavioral: Negative.   All other systems reviewed and are negative.      Objective:   Physical Exam  Constitutional: She is oriented to person, place, and time. She appears well-developed and well-nourished. No distress.  HENT:  Head: Normocephalic and atraumatic.  Right Ear: External ear normal.  Left Ear: External ear normal.  Mouth/Throat: Oropharynx is clear and moist.  Nasal passage erythemas with moderate swelling    Eyes: Pupils are equal, round, and reactive to light.  Neck: Normal range of motion. Neck supple. No thyromegaly  present.  Cardiovascular: Normal rate, regular rhythm, normal heart sounds and intact distal pulses.   No murmur heard. Pulmonary/Chest: Effort normal and breath sounds normal. No respiratory distress. She has no wheezes.  Abdominal: Soft. Bowel sounds are normal. She exhibits no distension. There is no tenderness.  Musculoskeletal: Normal range of motion. She exhibits no edema or tenderness.  Neurological: She is alert and oriented to person, place, and time. She has normal reflexes. No cranial nerve deficit.  Skin: Skin is warm and dry.  Psychiatric: She has a normal mood and affect. Her behavior is normal. Judgment and thought content normal.  Vitals reviewed.     BP 181/82 mmHg  Pulse 62  Temp(Src) 96.9 F (36.1 C) (Oral)  Ht 4\' 11"  (1.499 m)  Wt 121 lb (54.885 kg)  BMI 24.43 kg/m2     Assessment & Plan:  1. Allergic rhinitis, unspecified allergic rhinitis type -Avoid allergens when possible -PT to start rx today -RTO prn - montelukast (SINGULAIR) 10 MG tablet; Take 1 tablet (10 mg total) by mouth at bedtime.  Dispense: 30 tablet; Refill: 3 - mometasone (NASONEX) 50 MCG/ACT nasal spray; Place 2 sprays into the nose daily.  Dispense: 17 g; Refill: 12  Jannifer Rodneyhristy Hawks, FNP

## 2015-04-27 NOTE — Patient Instructions (Addendum)
Allergic Rhinitis Allergic rhinitis is when the mucous membranes in the nose respond to allergens. Allergens are particles in the air that cause your body to have an allergic reaction. This causes you to release allergic antibodies. Through a chain of events, these eventually cause you to release histamine into the blood stream. Although meant to protect the body, it is this release of histamine that causes your discomfort, such as frequent sneezing, congestion, and an itchy, runny nose.  CAUSES Seasonal allergic rhinitis (hay fever) is caused by pollen allergens that may come from grasses, trees, and weeds. Year-round allergic rhinitis (perennial allergic rhinitis) is caused by allergens such as house dust mites, pet dander, and mold spores. SYMPTOMS  Nasal stuffiness (congestion).  Itchy, runny nose with sneezing and tearing of the eyes. DIAGNOSIS Your health care provider can help you determine the allergen or allergens that trigger your symptoms. If you and your health care provider are unable to determine the allergen, skin or blood testing may be used. Your health care provider will diagnose your condition after taking your health history and performing a physical exam. Your health care provider may assess you for other related conditions, such as asthma, pink eye, or an ear infection. TREATMENT Allergic rhinitis does not have a cure, but it can be controlled by:  Medicines that block allergy symptoms. These may include allergy shots, nasal sprays, and oral antihistamines.  Avoiding the allergen. Hay fever may often be treated with antihistamines in pill or nasal spray forms. Antihistamines block the effects of histamine. There are over-the-counter medicines that may help with nasal congestion and swelling around the eyes. Check with your health care provider before taking or giving this medicine. If avoiding the allergen or the medicine prescribed do not work, there are many new medicines  your health care provider can prescribe. Stronger medicine may be used if initial measures are ineffective. Desensitizing injections can be used if medicine and avoidance does not work. Desensitization is when a patient is given ongoing shots until the body becomes less sensitive to the allergen. Make sure you follow up with your health care provider if problems continue. HOME CARE INSTRUCTIONS It is not possible to completely avoid allergens, but you can reduce your symptoms by taking steps to limit your exposure to them. It helps to know exactly what you are allergic to so that you can avoid your specific triggers. SEEK MEDICAL CARE IF:  You have a fever.  You develop a cough that does not stop easily (persistent).  You have shortness of breath.  You start wheezing.  Symptoms interfere with normal daily activities.   This information is not intended to replace advice given to you by your health care provider. Make sure you discuss any questions you have with your health care provider.   Document Released: 01/14/2001 Document Revised: 05/12/2014 Document Reviewed: 12/27/2012 Elsevier Interactive Patient Education 2016 [employer] Inc.  

## 2015-05-01 ENCOUNTER — Other Ambulatory Visit: Payer: Self-pay | Admitting: Nurse Practitioner

## 2015-05-08 DIAGNOSIS — Z1231 Encounter for screening mammogram for malignant neoplasm of breast: Secondary | ICD-10-CM | POA: Diagnosis not present

## 2015-05-08 LAB — HM MAMMOGRAPHY: HM MAMMO: NEGATIVE

## 2015-05-11 ENCOUNTER — Encounter: Payer: Self-pay | Admitting: *Deleted

## 2015-05-21 ENCOUNTER — Ambulatory Visit (INDEPENDENT_AMBULATORY_CARE_PROVIDER_SITE_OTHER): Payer: Commercial Managed Care - HMO | Admitting: Family Medicine

## 2015-05-21 ENCOUNTER — Encounter: Payer: Self-pay | Admitting: Family Medicine

## 2015-05-21 ENCOUNTER — Ambulatory Visit (INDEPENDENT_AMBULATORY_CARE_PROVIDER_SITE_OTHER): Payer: Commercial Managed Care - HMO

## 2015-05-21 VITALS — BP 166/67 | HR 66 | Temp 97.9°F | Ht 59.0 in | Wt 121.0 lb

## 2015-05-21 DIAGNOSIS — M6740 Ganglion, unspecified site: Secondary | ICD-10-CM

## 2015-05-21 DIAGNOSIS — I10 Essential (primary) hypertension: Secondary | ICD-10-CM | POA: Diagnosis not present

## 2015-05-21 NOTE — Patient Instructions (Signed)
DASH Eating Plan  DASH stands for "Dietary Approaches to Stop Hypertension." The DASH eating plan is a healthy eating plan that has been shown to reduce high blood pressure (hypertension). Additional health benefits may include reducing the risk of type 2 diabetes mellitus, heart disease, and stroke. The DASH eating plan may also help with weight loss.  WHAT DO I NEED TO KNOW ABOUT THE DASH EATING PLAN?  For the DASH eating plan, you will follow these general guidelines:  · Choose foods with a percent daily value for sodium of less than 5% (as listed on the food label).  · Use salt-free seasonings or herbs instead of table salt or sea salt.  · Check with your health care provider or pharmacist before using salt substitutes.  · Eat lower-sodium products, often labeled as "lower sodium" or "no salt added."  · Eat fresh foods.  · Eat more vegetables, fruits, and low-fat dairy products.  · Choose whole grains. Look for the word "whole" as the first word in the ingredient list.  · Choose fish and skinless chicken or turkey more often than red meat. Limit fish, poultry, and meat to 6 oz (170 g) each day.  · Limit sweets, desserts, sugars, and sugary drinks.  · Choose heart-healthy fats.  · Limit cheese to 1 oz (28 g) per day.  · Eat more home-cooked food and less restaurant, buffet, and fast food.  · Limit fried foods.  · Cook foods using methods other than frying.  · Limit canned vegetables. If you do use them, rinse them well to decrease the sodium.  · When eating at a restaurant, ask that your food be prepared with less salt, or no salt if possible.  WHAT FOODS CAN I EAT?  Seek help from a dietitian for individual calorie needs.  Grains  Whole grain or whole wheat bread. Brown rice. Whole grain or whole wheat pasta. Quinoa, bulgur, and whole grain cereals. Low-sodium cereals. Corn or whole wheat flour tortillas. Whole grain cornbread. Whole grain crackers. Low-sodium crackers.  Vegetables  Fresh or frozen vegetables  (raw, steamed, roasted, or grilled). Low-sodium or reduced-sodium tomato and vegetable juices. Low-sodium or reduced-sodium tomato sauce and paste. Low-sodium or reduced-sodium canned vegetables.   Fruits  All fresh, canned (in natural juice), or frozen fruits.  Meat and Other Protein Products  Ground beef (85% or leaner), grass-fed beef, or beef trimmed of fat. Skinless chicken or turkey. Ground chicken or turkey. Pork trimmed of fat. All fish and seafood. Eggs. Dried beans, peas, or lentils. Unsalted nuts and seeds. Unsalted canned beans.  Dairy  Low-fat dairy products, such as skim or 1% milk, 2% or reduced-fat cheeses, low-fat ricotta or cottage cheese, or plain low-fat yogurt. Low-sodium or reduced-sodium cheeses.  Fats and Oils  Tub margarines without trans fats. Light or reduced-fat mayonnaise and salad dressings (reduced sodium). Avocado. Safflower, olive, or canola oils. Natural peanut or almond butter.  Other  Unsalted popcorn and pretzels.  The items listed above may not be a complete list of recommended foods or beverages. Contact your dietitian for more options.  WHAT FOODS ARE NOT RECOMMENDED?  Grains  White bread. White pasta. White rice. Refined cornbread. Bagels and croissants. Crackers that contain trans fat.  Vegetables  Creamed or fried vegetables. Vegetables in a cheese sauce. Regular canned vegetables. Regular canned tomato sauce and paste. Regular tomato and vegetable juices.  Fruits  Dried fruits. Canned fruit in light or heavy syrup. Fruit juice.  Meat and Other Protein   Products  Fatty cuts of meat. Ribs, chicken wings, bacon, sausage, bologna, salami, chitterlings, fatback, hot dogs, bratwurst, and packaged luncheon meats. Salted nuts and seeds. Canned beans with salt.  Dairy  Whole or 2% milk, cream, half-and-half, and cream cheese. Whole-fat or sweetened yogurt. Full-fat cheeses or blue cheese. Nondairy creamers and whipped toppings. Processed cheese, cheese spreads, or cheese  curds.  Condiments  Onion and garlic salt, seasoned salt, table salt, and sea salt. Canned and packaged gravies. Worcestershire sauce. Tartar sauce. Barbecue sauce. Teriyaki sauce. Soy sauce, including reduced sodium. Steak sauce. Fish sauce. Oyster sauce. Cocktail sauce. Horseradish. Ketchup and mustard. Meat flavorings and tenderizers. Bouillon cubes. Hot sauce. Tabasco sauce. Marinades. Taco seasonings. Relishes.  Fats and Oils  Butter, stick margarine, lard, shortening, ghee, and bacon fat. Coconut, palm kernel, or palm oils. Regular salad dressings.  Other  Pickles and olives. Salted popcorn and pretzels.  The items listed above may not be a complete list of foods and beverages to avoid. Contact your dietitian for more information.  WHERE CAN I FIND MORE INFORMATION?  National Heart, [employer], [employer] Institute: www.nhlbi.nih.gov/health/health-topics/topics/dash/     This information is not intended to replace advice given to you by your health care provider. Make sure you discuss any questions you have with your health care provider.     Document Released: 04/10/2011 Document Revised: 05/12/2014 Document Reviewed: 02/23/2013  Elsevier Interactive Patient Education ©2016 [employer] Inc[employer]

## 2015-05-21 NOTE — Progress Notes (Signed)
Subjective:  Patient ID: Ellen Woods, female    DOB: Aug 02, 1940  Age: 75 y.o. MRN: [id number]  CC: thumb pain   HPI Ellen Woods presents for new onset of pain in the left thumb. No known injury. She does do a lot of work with her hands with crafts etc. housework. She is right-handed. The pain is moderately intense it is intermittent. It is worsened by increased use. There has been no associated swelling or erythema. No previous similar symptoms. She has noted a bulging from the base of the first MCP   follow-up of hypertension. Patient has no history of headache chest pain or shortness of breath or recent cough. Patient also denies symptoms of TIA such as numbness weakness lateralizing. Patient checks  blood pressure at home and has not had any elevated readings recently. Patient denies side effects from medication. States taking it regularly.   History Ellen Woods has a past medical history of Unspecified essential hypertension; Type II or unspecified type diabetes mellitus without mention of complication, not stated as uncontrolled; Coronary atherosclerosis of native coronary artery; Encounter for long-term (current) use of insulin ([place]); PDA (patent ductus arteriosus); Pericardial calcification; Thyroid disease; Arthritis; Back pain (1/14); Family history of anesthesia complication; and COPD (chronic obstructive pulmonary disease) ([place]).   She has past surgical history that includes Incisional hernia repair; Abdominal hysterectomy; Tubal ligation; and Intramedullary (im) nail intertrochanteric (Left, 11/13/2012).   Her family history includes Stroke in her mother.She reports that she has never smoked. She has never used smokeless tobacco. She reports that she does not drink alcohol or use illicit drugs.  Outpatient Prescriptions Prior to Visit  Medication Sig Dispense Refill  . ACCU-CHEK FASTCLIX LANCETS MISC USE FOR FINGERSTICK BLOOD SUGAR TESTING SIX TIMES DAILY 612 each 1  .  acetaminophen (TYLENOL) 325 MG tablet Take 2 tablets (650 mg total) by mouth every 6 (six) hours as needed. (Patient taking differently: Take 650 mg by mouth every 6 (six) hours as needed for moderate pain. )    . Alcohol Swabs (B-D SINGLE USE SWABS REGULAR) PADS USE FOR FINGERSTICK BLOOD SUGAR TESTING SIX TIMES DAILY 600 each 2  . alendronate (FOSAMAX) 70 MG tablet TAKE 1 TABLET EVERY WEEK 12 tablet 1  . ALPRAZolam (XANAX) 0.5 MG tablet TAKE 1 TABLET TWICE DAILY 180 tablet 0  . aspirin 81 MG tablet Take 81 mg by mouth daily.    . B-D UF III MINI PEN NEEDLES 31G X 5 MM MISC USE WITH PENS - UP TO FOUR TIMES DAILY 360 each 2  . Blood Glucose Monitoring Suppl (ACCU-CHEK NANO SMARTVIEW) W/DEVICE KIT USE FOR FINGERSTICK BLOOD SUGAR TESTING SIX TIMES DAILY 1 kit 0  . ciclopirox (PENLAC) 8 % solution Apply topically at bedtime. Apply over nail and surrounding skin. Apply daily over previous coat. After seven (7) days, may remove with alcohol and continue cycle. 6.6 mL 0  . docusate sodium 100 MG CAPS Take 100 mg by mouth 2 (two) times daily. 60 capsule 0  . furosemide (LASIX) 20 MG tablet Take 1 tablet (20 mg total) by mouth daily. 90 tablet 1  . glucose blood (ACCU-CHEK SMARTVIEW) test strip USE FOR FINGERSTICK BLOOD SUGAR TESTING SIX TIMES DAILY 600 each 2  . insulin aspart (NOVOLOG FLEXPEN) 100 UNIT/ML FlexPen Inject up to 15 units prior to each meal per sliding scale 15 mL 1  . insulin glargine (LANTUS) 100 UNIT/ML injection Inject 0.2 mLs (20 Units total) into the skin every morning. [place]  mL 1  . lisinopril (PRINIVIL,ZESTRIL) 20 MG tablet Take 1 tablet (20 mg total) by mouth daily. 90 tablet 3  . metoprolol tartrate (LOPRESSOR) 25 MG tablet TAKE 1 TABLET TWICE DAILY 180 tablet 1  . mometasone (NASONEX) 50 MCG/ACT nasal spray Place 2 sprays into the nose daily. 17 g 12  . montelukast (SINGULAIR) 10 MG tablet Take 1 tablet (10 mg total) by mouth at bedtime. 30 tablet 3  . Multiple Vitamin (MULTIVITAMIN)  tablet Take 1 tablet by mouth daily.    Marland Kitchen NITROSTAT 0.4 MG SL tablet Place 1 tablet (0.4 mg total) under the tongue every 5 (five) minutes as needed for chest pain. 20 tablet 0  . potassium chloride SA (K-DUR,KLOR-CON) 20 MEQ tablet TAKE 1 TABLET EVERY DAY 90 tablet 0   No facility-administered medications prior to visit.    ROS Review of Systems  Constitutional: Negative for fever.  HENT: Negative for congestion, rhinorrhea and sore throat.   Respiratory: Negative for cough and shortness of breath.   Cardiovascular: Negative for chest pain and palpitations.  Gastrointestinal: Negative for abdominal pain.  Musculoskeletal: Negative for joint swelling.    Objective:  BP 166/67 mmHg  Pulse 66  Temp(Src) 97.9 F (36.6 C) (Oral)  Ht 4' 11"  (1.499 m)  Wt 121 lb (54.885 kg)  BMI 24.43 kg/m2  BP Readings from Last 3 Encounters:  05/21/15 166/67  04/27/15 161/74  03/07/15 130/66    Wt Readings from Last 3 Encounters:  05/21/15 121 lb (54.885 kg)  04/27/15 121 lb (54.885 kg)  03/07/15 115 lb 9.6 oz (52.436 kg)     Physical Exam  Constitutional: She appears well-developed and well-nourished.  HENT:  Head: Normocephalic.  Cardiovascular: Normal rate and regular rhythm.   No murmur heard. Pulmonary/Chest: Effort normal and breath sounds normal.  Musculoskeletal: She exhibits tenderness (at the base of the left thumb. Small ganglion cyst noted as well.).     Lab Results  Component Value Date   WBC 4.9 10/13/2014   HGB 12.8 10/13/2014   HCT 40.6 10/13/2014   PLT 246 07/21/2014   GLUCOSE 163* 02/09/2015   CHOL 234* 01/24/2015   TRIG 146 01/24/2015   HDL 69 01/24/2015   LDLCALC 136* 01/24/2015   ALT 15 01/24/2015   AST 18 01/24/2015   NA 143 02/09/2015   K 4.4 02/09/2015   CL 102 02/09/2015   CREATININE 0.92 02/09/2015   BUN 22 02/09/2015   CO2 26 02/09/2015   TSH 3.800 03/14/2014   INR 1.01 11/12/2012   HGBA1C 8.6 01/24/2015    Ct Head Wo  Contrast  03/14/2014  CLINICAL DATA:  75 year old diabetic female with confusion. Initial encounter. EXAM: CT HEAD WITHOUT CONTRAST TECHNIQUE: Contiguous axial images were obtained from the base of the skull through the vertex without intravenous contrast. COMPARISON:  08/15/2003 MR.  No comparison CT. FINDINGS: Exam is slightly motion degraded. No intracranial hemorrhage. Small vessel disease type changes without CT evidence of large acute infarct. Atrophy without hydrocephalus. No intracranial mass lesion noted on this unenhanced exam. Vascular calcifications. No intracranial mass lesion noted on this unenhanced exam. IMPRESSION: Exam is slightly motion degraded. No intracranial hemorrhage. Small vessel disease type changes without CT evidence of large acute infarct. Atrophy without hydrocephalus. Electronically Signed   By: Chauncey Cruel M.D.   On: 03/14/2014 13:10   Dg Chest Port 1 View  03/14/2014  CLINICAL DATA:  Altered mental status, hypoglycemia. COPD, diabetic. EXAM: PORTABLE CHEST - 1 VIEW COMPARISON:  11/12/2012 . FINDINGS: Mild cardiomegaly. Diffuse interstitial prominence throughout the lungs has increased since prior study. This could reflect mild interstitial edema. No confluent opacities or effusions. No acute bony abnormality. IMPRESSION: Mild diffuse interstitial prominence throughout the lungs, question interstitial edema. Electronically Signed   By: Rolm Baptise M.D.   On: 03/14/2014 12:48   Ct Cta Abd/pel W/cm &/or W/o Cm  03/14/2014  CLINICAL DATA:  Abdominal pain. Elevated lactate. Rule out mesenteric ischemia. EXAM: CTA ABDOMEN AND PELVIS wITHOUT AND WITH CONTRAST TECHNIQUE: Multidetector CT imaging of the abdomen and pelvis was performed using the standard protocol during bolus administration of intravenous contrast. Multiplanar reconstructed images and MIPs were obtained and reviewed to evaluate the vascular anatomy. CONTRAST:  17m OMNIPAQUE IOHEXOL 350 MG/ML SOLN COMPARISON:   None. FINDINGS: The aorta is heavily calcified. Superior mesenteric artery and celiac artery are widely patent without stenosis. Inferior mesenteric artery is stenotic at its origin but patent. Single left renal arteries widely patent. 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 The right renal artery arises from the aorta at the bifurcation without visible stenosis. Calcifications within the liver and spleen compatible with old granulomatous disease. Gallbladder, pancreas, adrenals and left kidney are unremarkable. Again, right kidney is within the pelvis. Areas of cortical loss and scarring within the right kidney. Appendix is visualized and is normal. Stomach and small bowel decompressed, grossly unremarkable. The rectum is dilated and stool filled. There is mild rectal wall thickening posteriorly and significant edema/ stranding in the presacral space and around the lateral walls of the rectum. Findings suggestive of proctitis. Probable fecal impaction as well. No free fluid, free air or adenopathy. Prior hysterectomy. Urinary bladder is decompressed with Foley catheter in place. No acute bony abnormality or focal bone lesion. Cardiomegaly. Calcifications within the posterior pericardium compatible with calcific pericarditis. Left lower lobe atelectasis or early infiltrate. Right lung is clear. No effusions. Review of the MIP images confirms the above findings. IMPRESSION: Dilated stool-filled rectum suggesting fecal impaction. There is also mild wall thickening in the rectum and stranding in the perirectal soft tissues. Findings suggestive of proctitis. Right pelvic kidney. No evidence of bowel ischemia. Old granulomas disease in the liver and spleen. Calcific pericarditis.  Cardiomegaly. Left lower lobe atelectasis or infiltrate. Electronically Signed   By: KRolm BaptiseM.D.   On: 03/14/2014 15:46    Assessment & Plan:   GDiashawas seen today for thumb pain.  Diagnoses and all orders for this  visit:  Ganglion cyst -     DG Finger Thumb Left; Future  Essential hypertension  .DASH Plan Handout given. Salt avoidance measures reviewed. I am having Ms. Heiler maintain her multivitamin, acetaminophen, aspirin, ACCU-CHEK FASTCLIX LANCETS, DSS, ACCU-CHEK NANO SMARTVIEW, furosemide, B-D UF III MINI PEN NEEDLES, insulin aspart, glucose blood, B-D SINGLE USE SWABS REGULAR, alendronate, lisinopril, ALPRAZolam, NITROSTAT, ciclopirox, insulin glargine, potassium chloride SA, montelukast, mometasone, and metoprolol tartrate.  No orders of the defined types were placed in this encounter.   .Follow-up: Return if symptoms worsen or fail to improveas well as your upcoming appointment with Ms. Hawks., for diabetes.  WClaretta Fraise M.D.

## 2015-05-29 ENCOUNTER — Encounter: Payer: Self-pay | Admitting: Family Medicine

## 2015-05-29 ENCOUNTER — Ambulatory Visit (INDEPENDENT_AMBULATORY_CARE_PROVIDER_SITE_OTHER): Payer: Commercial Managed Care - HMO | Admitting: Family Medicine

## 2015-05-29 ENCOUNTER — Ambulatory Visit: Payer: Commercial Managed Care - HMO | Admitting: Family

## 2015-05-29 VITALS — BP 135/60 | HR 67 | Temp 96.5°F | Ht 59.0 in | Wt 118.0 lb

## 2015-05-29 DIAGNOSIS — E11649 Type 2 diabetes mellitus with hypoglycemia without coma: Secondary | ICD-10-CM | POA: Diagnosis not present

## 2015-05-29 DIAGNOSIS — I10 Essential (primary) hypertension: Secondary | ICD-10-CM | POA: Diagnosis not present

## 2015-05-29 DIAGNOSIS — Z794 Long term (current) use of insulin: Secondary | ICD-10-CM | POA: Diagnosis not present

## 2015-05-29 DIAGNOSIS — E785 Hyperlipidemia, unspecified: Secondary | ICD-10-CM

## 2015-05-29 DIAGNOSIS — M810 Age-related osteoporosis without current pathological fracture: Secondary | ICD-10-CM

## 2015-05-29 LAB — POCT URINALYSIS DIPSTICK
Bilirubin, UA: NEGATIVE
Glucose, UA: NEGATIVE
Ketones, UA: NEGATIVE
NITRITE UA: NEGATIVE
PH UA: 5
RBC UA: NEGATIVE
Spec Grav, UA: 1.025
UROBILINOGEN UA: NEGATIVE

## 2015-05-29 LAB — POCT GLYCOSYLATED HEMOGLOBIN (HGB A1C): HEMOGLOBIN A1C: 8.2

## 2015-05-29 MED ORDER — INSULIN GLARGINE 100 UNIT/ML ~~LOC~~ SOLN: 10.0000 [IU] | Freq: Every morning | SUBCUTANEOUS | Status: DC

## 2015-05-29 MED ORDER — TERIPARATIDE (RECOMBINANT) 600 MCG/2.4ML ~~LOC~~ SOLN: 20.0000 ug | Freq: Every day | SUBCUTANEOUS | Status: DC

## 2015-05-29 NOTE — Progress Notes (Signed)
Subjective:  Patient ID: Ellen Woods, female    DOB: June 07, 1940  Age: 75 y.o. MRN: [id number]  CC: Diabetes; Hypertension; and Hyperlipidemia   HPI Ellen Woods presents for  follow-up of hypertension. Patient has no history of headache chest pain or shortness of breath or recent cough. Patient also denies symptoms of TIA such as numbness weakness lateralizing. Patient checks  blood pressure at home and has not had any elevated readings recently. Patient denies side effects from his medication. States taking it regularly.  Patient also  in for follow-up of elevated cholesterol. Doing well without complaints on current medication. Denies side effects of statin including myalgia and arthralgia and nausea. Also in today for liver function testing. Currently no chest pain, shortness of breath or other cardiovascular related symptoms noted.  Follow-up of diabetes. Patient does check blood sugar at home. Readings run between 40s and 90s fasting. Patient denies symptoms such as polyuria, polydipsia, excessive hunger, nausea Every morning feeling weak and shaky. Using xanax to treat the sx. Medications as noted below.Taking 3-5 units of novolog only.   Patient states she never got the report of her DEXA scan last fall. That is reviewed with her today showing a score of -3.5 indicating moderately severe osteoporosis. Additionally she faithfully takes her Fosamax. History Ellen Woods has a past medical history of Unspecified essential hypertension; Type II or unspecified type diabetes mellitus without mention of complication, not stated as uncontrolled; Coronary atherosclerosis of native coronary artery; Encounter for long-term (current) use of insulin ([place]); PDA (patent ductus arteriosus); Pericardial calcification; Thyroid disease; Arthritis; Back pain (1/14); Family history of anesthesia complication; and COPD (chronic obstructive pulmonary disease) ([place]).   She has past surgical history that  includes Incisional hernia repair; Abdominal hysterectomy; Tubal ligation; and Intramedullary (im) nail intertrochanteric (Left, 11/13/2012).   Her family history includes Stroke in her mother.She reports that she has never smoked. She has never used smokeless tobacco. She reports that she does not drink alcohol or use illicit drugs.  Current Outpatient Prescriptions on File Prior to Visit  Medication Sig Dispense Refill  . ACCU-CHEK FASTCLIX LANCETS MISC USE FOR FINGERSTICK BLOOD SUGAR TESTING SIX TIMES DAILY 612 each 1  . acetaminophen (TYLENOL) 325 MG tablet Take 2 tablets (650 mg total) by mouth every 6 (six) hours as needed. (Patient taking differently: Take 650 mg by mouth every 6 (six) hours as needed for moderate pain. )    . Alcohol Swabs (B-D SINGLE USE SWABS REGULAR) PADS USE FOR FINGERSTICK BLOOD SUGAR TESTING SIX TIMES DAILY 600 each 2  . alendronate (FOSAMAX) 70 MG tablet TAKE 1 TABLET EVERY WEEK 12 tablet 1  . ALPRAZolam (XANAX) 0.5 MG tablet TAKE 1 TABLET TWICE DAILY 180 tablet 0  . aspirin 81 MG tablet Take 81 mg by mouth daily.    . B-D UF III MINI PEN NEEDLES 31G X 5 MM MISC USE WITH PENS - UP TO FOUR TIMES DAILY 360 each 2  . Blood Glucose Monitoring Suppl (ACCU-CHEK NANO SMARTVIEW) W/DEVICE KIT USE FOR FINGERSTICK BLOOD SUGAR TESTING SIX TIMES DAILY 1 kit 0  . ciclopirox (PENLAC) 8 % solution Apply topically at bedtime. Apply over nail and surrounding skin. Apply daily over previous coat. After seven (7) days, may remove with alcohol and continue cycle. 6.6 mL 0  . docusate sodium 100 MG CAPS Take 100 mg by mouth 2 (two) times daily. 60 capsule 0  . furosemide (LASIX) 20 MG tablet Take 1 tablet (20 mg total)  by mouth daily. 90 tablet 1  . glucose blood (ACCU-CHEK SMARTVIEW) test strip USE FOR FINGERSTICK BLOOD SUGAR TESTING SIX TIMES DAILY 600 each 2  . lisinopril (PRINIVIL,ZESTRIL) 20 MG tablet Take 1 tablet (20 mg total) by mouth daily. 90 tablet 3  . metoprolol tartrate  (LOPRESSOR) 25 MG tablet TAKE 1 TABLET TWICE DAILY 180 tablet 1  . mometasone (NASONEX) 50 MCG/ACT nasal spray Place 2 sprays into the nose daily. 17 g 12  . montelukast (SINGULAIR) 10 MG tablet Take 1 tablet (10 mg total) by mouth at bedtime. 30 tablet 3  . Multiple Vitamin (MULTIVITAMIN) tablet Take 1 tablet by mouth daily.    Marland Kitchen NITROSTAT 0.4 MG SL tablet Place 1 tablet (0.4 mg total) under the tongue every 5 (five) minutes as needed for chest pain. 20 tablet 0  . potassium chloride SA (K-DUR,KLOR-CON) 20 MEQ tablet TAKE 1 TABLET EVERY DAY 90 tablet 0   No current facility-administered medications on file prior to visit.    ROS Review of Systems  Constitutional: Negative for fever, activity change and appetite change.  HENT: Negative for congestion, rhinorrhea and sore throat.   Eyes: Negative for visual disturbance.  Respiratory: Negative for cough and shortness of breath.   Cardiovascular: Negative for chest pain and palpitations.  Gastrointestinal: Negative for nausea, abdominal pain and diarrhea.  Genitourinary: Negative for dysuria.  Musculoskeletal: Negative for myalgias and arthralgias.    Objective:  BP 135/60 mmHg  Pulse 67  Temp(Src) 96.5 F (35.8 C) (Oral)  Ht 4' 11"  (1.499 m)  Wt 118 lb (53.524 kg)  BMI 23.82 kg/m2  SpO2 98%  BP Readings from Last 3 Encounters:  05/29/15 135/60  05/21/15 166/67  04/27/15 161/74    Wt Readings from Last 3 Encounters:  05/29/15 118 lb (53.524 kg)  05/21/15 121 lb (54.885 kg)  04/27/15 121 lb (54.885 kg)     Physical Exam  Constitutional: She is oriented to person, place, and time. She appears well-developed and well-nourished. No distress.  HENT:  Head: Normocephalic and atraumatic.  Right Ear: External ear normal.  Left Ear: External ear normal.  Nose: Nose normal.  Mouth/Throat: Oropharynx is clear and moist.  Eyes: Conjunctivae and EOM are normal. Pupils are equal, round, and reactive to light.  Neck: Normal range  of motion. Neck supple. No thyromegaly present.  Cardiovascular: Normal rate, regular rhythm and normal heart sounds.   No murmur heard. Pulmonary/Chest: Effort normal and breath sounds normal. No respiratory distress. She has no wheezes. She has no rales.  Abdominal: Soft. Bowel sounds are normal. She exhibits no distension. There is no tenderness.  Lymphadenopathy:    She has no cervical adenopathy.  Neurological: She is alert and oriented to person, place, and time. She has normal reflexes.  Skin: Skin is warm and dry.  Psychiatric: She has a normal mood and affect. Her behavior is normal. Judgment and thought content normal.    Lab Results  Component Value Date   HGBA1C 8.2 05/29/2015   HGBA1C 8.6 01/24/2015   HGBA1C 8.8 07/21/2014    Lab Results  Component Value Date   WBC 4.9 10/13/2014   HGB 12.8 10/13/2014   HCT 40.6 10/13/2014   PLT 246 07/21/2014   GLUCOSE 163* 02/09/2015   CHOL 234* 01/24/2015   TRIG 146 01/24/2015   HDL 69 01/24/2015   LDLCALC 136* 01/24/2015   ALT 15 01/24/2015   AST 18 01/24/2015   NA 143 02/09/2015   K 4.4 02/09/2015  CL 102 02/09/2015   CREATININE 0.92 02/09/2015   BUN 22 02/09/2015   CO2 26 02/09/2015   TSH 3.800 03/14/2014   INR 1.01 11/12/2012   HGBA1C 8.2 05/29/2015    Ct Head Wo Contrast  03/14/2014  CLINICAL DATA:  75 year old diabetic female with confusion. Initial encounter. EXAM: CT HEAD WITHOUT CONTRAST TECHNIQUE: Contiguous axial images were obtained from the base of the skull through the vertex without intravenous contrast. COMPARISON:  08/15/2003 MR.  No comparison CT. FINDINGS: Exam is slightly motion degraded. No intracranial hemorrhage. Small vessel disease type changes without CT evidence of large acute infarct. Atrophy without hydrocephalus. No intracranial mass lesion noted on this unenhanced exam. Vascular calcifications. No intracranial mass lesion noted on this unenhanced exam. IMPRESSION: Exam is slightly motion  degraded. No intracranial hemorrhage. Small vessel disease type changes without CT evidence of large acute infarct. Atrophy without hydrocephalus. Electronically Signed   By: Chauncey Cruel M.D.   On: 03/14/2014 13:10   Dg Chest Port 1 View  03/14/2014  CLINICAL DATA:  Altered mental status, hypoglycemia. COPD, diabetic. EXAM: PORTABLE CHEST - 1 VIEW COMPARISON:  11/12/2012 . FINDINGS: Mild cardiomegaly. Diffuse interstitial prominence throughout the lungs has increased since prior study. This could reflect mild interstitial edema. No confluent opacities or effusions. No acute bony abnormality. IMPRESSION: Mild diffuse interstitial prominence throughout the lungs, question interstitial edema. Electronically Signed   By: Rolm Baptise M.D.   On: 03/14/2014 12:48   Ct Cta Abd/pel W/cm &/or W/o Cm  03/14/2014  CLINICAL DATA:  Abdominal pain. Elevated lactate. Rule out mesenteric ischemia. EXAM: CTA ABDOMEN AND PELVIS wITHOUT AND WITH CONTRAST TECHNIQUE: Multidetector CT imaging of the abdomen and pelvis was performed using the standard protocol during bolus administration of intravenous contrast. Multiplanar reconstructed images and MIPs were obtained and reviewed to evaluate the vascular anatomy. CONTRAST:  38m OMNIPAQUE IOHEXOL 350 MG/ML SOLN COMPARISON:  None. FINDINGS: The aorta is heavily calcified. Superior mesenteric artery and celiac artery are widely patent without stenosis. Inferior mesenteric artery is stenotic at its origin but patent. Single left renal arteries widely patent. 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 The right renal artery arises from the aorta at the bifurcation without visible stenosis. Calcifications within the liver and spleen compatible with old granulomatous disease. Gallbladder, pancreas, adrenals and left kidney are unremarkable. Again, right kidney is within the pelvis. Areas of cortical loss and scarring within the right kidney. Appendix is visualized and is  normal. Stomach and small bowel decompressed, grossly unremarkable. The rectum is dilated and stool filled. There is mild rectal wall thickening posteriorly and significant edema/ stranding in the presacral space and around the lateral walls of the rectum. Findings suggestive of proctitis. Probable fecal impaction as well. No free fluid, free air or adenopathy. Prior hysterectomy. Urinary bladder is decompressed with Foley catheter in place. No acute bony abnormality or focal bone lesion. Cardiomegaly. Calcifications within the posterior pericardium compatible with calcific pericarditis. Left lower lobe atelectasis or early infiltrate. Right lung is clear. No effusions. Review of the MIP images confirms the above findings. IMPRESSION: Dilated stool-filled rectum suggesting fecal impaction. There is also mild wall thickening in the rectum and stranding in the perirectal soft tissues. Findings suggestive of proctitis. Right pelvic kidney. No evidence of bowel ischemia. Old granulomas disease in the liver and spleen. Calcific pericarditis.  Cardiomegaly. Left lower lobe atelectasis or infiltrate. Electronically Signed   By: KRolm BaptiseM.D.   On: 03/14/2014 15:46  Assessment & Plan:   Ellen Woods was seen today for diabetes, hypertension and hyperlipidemia.  Diagnoses and all orders for this visit:  Osteoporosis  Diabetic hypoglycemia ([place]) -     POCT glycosylated hemoglobin (Hb A1C) -     CMP14+EGFR -     Microalbumin / creatinine urine ratio -     POCT urinalysis dipstick -     CBC with Differential/Platelet  Uncontrolled type 2 diabetes mellitus with hypoglycemia without coma, with long-term current use of insulin ([facility]) -     POCT glycosylated hemoglobin (Hb A1C) -     CMP14+EGFR -     Microalbumin / creatinine urine ratio -     POCT urinalysis dipstick -     CBC with Differential/Platelet  Essential hypertension, benign  Hyperlipidemia with target LDL less than 100 -     Lipid  panel  Other orders -     Teriparatide, Recombinant, (FORTEO) 600 MCG/2.4ML SOLN; Inject 0.08 mLs (20 mcg total) into the skin daily. -     insulin glargine (LANTUS) 100 UNIT/ML injection; Inject 0.1 mLs (10 Units total) into the skin every morning.   I have discontinued Ellen Woods's insulin aspart. I have also changed her insulin glargine. Additionally, I am having her start on Teriparatide (Recombinant). Lastly, I am having her maintain her multivitamin, acetaminophen, aspirin, ACCU-CHEK FASTCLIX LANCETS, DSS, ACCU-CHEK NANO SMARTVIEW, furosemide, B-D UF III MINI PEN NEEDLES, glucose blood, B-D SINGLE USE SWABS REGULAR, alendronate, lisinopril, ALPRAZolam, NITROSTAT, ciclopirox, potassium chloride SA, montelukast, mometasone, and metoprolol tartrate.  Meds ordered this encounter  Medications  . Teriparatide, Recombinant, (FORTEO) 600 MCG/2.4ML SOLN    Sig: Inject 0.08 mLs (20 mcg total) into the skin daily.    Dispense:  2.4 mL    Refill:  5  . insulin glargine (LANTUS) 100 UNIT/ML injection    Sig: Inject 0.1 mLs (10 Units total) into the skin every morning.    Dispense:  30 mL    Refill:  1    Dx  Code E11.9   Patient to see clinical pharmacist, Philomena Course to get started on Forteo. I encouraged her to resume her Lantus at 10 units daily and discontinue the NovoLog. Of note is that she has poor diabetic control in spite of the hypoglycemia, her A1c is high. Will start with encouraging her to take the Lantus and avoid the NovoLog in order to prevent hypoglycemia  Follow-up: Return in about 1 month (around 06/29/2015).  Claretta Fraise, M.D.

## 2015-05-29 NOTE — Patient Instructions (Signed)
Take Lantus 10 units every morning starting tomorrow. Discontinnue the ovolog. Measure your glucose twice a day. Write the readings on your log sheet and bring to every visit.

## 2015-05-30 ENCOUNTER — Other Ambulatory Visit: Payer: Self-pay | Admitting: Family Medicine

## 2015-05-30 LAB — LIPID PANEL
CHOL/HDL RATIO: 3.1 ratio (ref 0.0–4.4)
Cholesterol, Total: 226 mg/dL — ABNORMAL HIGH (ref 100–199)
HDL: 74 mg/dL (ref >39)
LDL Calculated: 132 mg/dL — ABNORMAL HIGH (ref 0–99)
TRIGLYCERIDES: 102 mg/dL (ref 0–149)
VLDL Cholesterol Cal: 20 mg/dL (ref 5–40)

## 2015-05-30 LAB — CBC WITH DIFFERENTIAL/PLATELET
BASOS: 1 %
Basophils Absolute: 0 10*3/uL (ref 0.0–0.2)
EOS (ABSOLUTE): 0.1 10*3/uL (ref 0.0–0.4)
EOS: 1 %
HEMATOCRIT: 38.2 % (ref 34.0–46.6)
HEMOGLOBIN: 12.9 g/dL (ref 11.1–15.9)
IMMATURE GRANS (ABS): 0 10*3/uL (ref 0.0–0.1)
IMMATURE GRANULOCYTES: 0 %
LYMPHS: 28 %
Lymphocytes Absolute: 1.7 10*3/uL (ref 0.7–3.1)
MCH: 31.7 pg (ref 26.6–33.0)
MCHC: 33.8 g/dL (ref 31.5–35.7)
MCV: 94 fL (ref 79–97)
MONOCYTES: 7 %
Monocytes Absolute: 0.4 10*3/uL (ref 0.1–0.9)
NEUTROS PCT: 63 %
Neutrophils Absolute: 3.7 10*3/uL (ref 1.4–7.0)
Platelets: 264 10*3/uL (ref 150–379)
RBC: 4.07 x10E6/uL (ref 3.77–5.28)
RDW: 13.2 % (ref 12.3–15.4)
WBC: 5.9 10*3/uL (ref 3.4–10.8)

## 2015-05-30 LAB — CMP14+EGFR
A/G RATIO: 2 (ref 1.1–2.5)
ALT: 22 IU/L (ref 0–32)
AST: 29 IU/L (ref 0–40)
Albumin: 4.3 g/dL (ref 3.5–4.8)
Alkaline Phosphatase: 72 IU/L (ref 39–117)
BILIRUBIN TOTAL: 0.4 mg/dL (ref 0.0–1.2)
BUN/Creatinine Ratio: 23 (ref 11–26)
BUN: 23 mg/dL (ref 8–27)
CHLORIDE: 101 mmol/L (ref 96–106)
CO2: 26 mmol/L (ref 18–29)
Calcium: 9.2 mg/dL (ref 8.7–10.3)
Creatinine, Ser: 0.99 mg/dL (ref 0.57–1.00)
GFR calc Af Amer: 65 mL/min/{1.73_m2} (ref >59)
GFR calc non Af Amer: 56 mL/min/{1.73_m2} — ABNORMAL LOW (ref >59)
GLOBULIN, TOTAL: 2.2 g/dL (ref 1.5–4.5)
Glucose: 118 mg/dL — ABNORMAL HIGH (ref 65–99)
POTASSIUM: 5.2 mmol/L (ref 3.5–5.2)
SODIUM: 143 mmol/L (ref 134–144)
Total Protein: 6.5 g/dL (ref 6.0–8.5)

## 2015-05-30 LAB — MICROALBUMIN / CREATININE URINE RATIO

## 2015-05-30 MED ORDER — INSULIN GLARGINE 100 UNIT/ML ~~LOC~~ SOLN: 10.0000 [IU] | Freq: Every morning | SUBCUTANEOUS | Status: DC

## 2015-05-30 MED ORDER — CANAGLIFLOZIN 300 MG PO TABS: 300.0000 mg | ORAL_TABLET | Freq: Every day | ORAL | Status: DC

## 2015-05-30 MED ORDER — NITROFURANTOIN MONOHYD MACRO 100 MG PO CAPS: 100.0000 mg | ORAL_CAPSULE | Freq: Two times a day (BID) | ORAL | Status: DC

## 2015-06-06 ENCOUNTER — Ambulatory Visit (INDEPENDENT_AMBULATORY_CARE_PROVIDER_SITE_OTHER): Payer: Medicare HMO | Admitting: Pharmacist

## 2015-06-06 ENCOUNTER — Encounter: Payer: Self-pay | Admitting: Pharmacist

## 2015-06-06 VITALS — BP 156/68 | HR 80 | Ht 59.0 in | Wt 118.5 lb

## 2015-06-06 DIAGNOSIS — M810 Age-related osteoporosis without current pathological fracture: Secondary | ICD-10-CM

## 2015-06-06 DIAGNOSIS — M80052D Age-related osteoporosis with current pathological fracture, left femur, subsequent encounter for fracture with routine healing: Secondary | ICD-10-CM

## 2015-06-06 MED ORDER — INSULIN GLARGINE 100 UNIT/ML ~~LOC~~ SOLN: 13.0000 [IU] | Freq: Every morning | SUBCUTANEOUS | Status: DC

## 2015-06-06 MED ORDER — CALCIUM CARB-CHOLECALCIFEROL 500-600 MG-UNIT PO TABS: 1.0000 | ORAL_TABLET | Freq: Every day | ORAL | Status: AC

## 2015-06-06 NOTE — Progress Notes (Signed)
[facility] Current Height: Height:  (149.9 cm)      Max Lifetime Height:   Current Weight: Weight: 118 lb 8 oz (53.751 kg)       Ethnicity:Caucasian  BP: BP: (!) 156/68 mmHg     HR:  Pulse Rate: 80      HPI: Does pt already have a diagnosis of:  Osteoporosis?  Yes  Back Pain?  Yes       Kyphosis?  No Prior fracture?  Yes - femur 10/2012  Med(s) for Osteoporosis/Osteopenia:  Just started Forteo last week and is also still taking alendronate (started 10/2012) Med(s) previously tried for Osteoporosis/Osteopenia:  none                                                             PMH: Age at menopause:  Patient unsure Hysterectomy?  Yes Oophorectomy?  No HRT? No Steroid Use?  No Thyroid med?  No History of cancer?  No History of digestive disorders (ie Crohn's)?  No Current or previous eating disorders?  No Last Vitamin D Result:  27.8 (09/01/2013) Last GFR Result:  56 (05/28/2105)   FH/SH: Family history of osteoporosis?  Sister has osteopenia Parent with history of hip fracture?  No Family history of breast cancer?  No Exercise?  No Smoking?  No Alcohol?  No    Calcium Assessment Calcium Intake  # of servings/day  Calcium mg  Milk (8 oz) 0  x  300  = 0  Yogurt (4 oz) 0 x  200 = 0  Cheese (1 oz) 0 x  200 = 0  Other Calcium sources     Ca supplement MVI  =    Estimated calcium intake per day     DEXA Results Date of Test T-Score for AP Spine L1-L4 T-Score for Total Left Hip T-Score for Total Right Hip  02/21/2015 -3.5 -- -2.7                    Assessment: Severe osteoporosis with history of femur fracture  Recommendations: 1.  Continue Forteo - discussed proper administration and storage.  Also discuss treatment length of 2 years.   Stop alendroante.  2.  recommend calcium  daily through supplementation or diet.  Patient to start calcium + D.  Recommend separating calcium and MVI administration. 3.  recommend weight  bearing exercise - 30 minutes at least 4 days per week.   4.  Counseled and educated about fall risk and prevention.  Recheck DEXA:  October 2017  Time spent counseling patient:  25 minutes  Henrene Pastor, PharmD, CPP

## 2015-06-06 NOTE — Patient Instructions (Signed)
Stop Alendronate.  Continue Forto - inject 20 mcg once a day (plan to continue for 2 years)  Start Calcium + D take 1 tablet daily and continue multivitamin - recommend to take calcium and multivitamin at different times.   Fall Prevention in the Home  Falls can cause injuries and can affect people from all age groups. There are many simple things that you can do to make your home safe and to help prevent falls. WHAT CAN I DO ON THE OUTSIDE OF MY HOME?  Regularly repair the edges of walkways and driveways and fix any cracks.  Remove high doorway thresholds.  Trim any shrubbery on the main path into your home.  Use bright outdoor lighting.  Clear walkways of debris and clutter, including tools and rocks.  Regularly check that handrails are securely fastened and in good repair. Both sides of any steps should have handrails.  Install guardrails along the edges of any raised decks or porches.  Have leaves, snow, and ice cleared regularly.  Use sand or salt on walkways during winter months.  In the garage, clean up any spills right away, including grease or oil spills. WHAT CAN I DO IN THE BATHROOM?  Use night lights.  Install grab bars by the toilet and in the tub and shower. Do not use towel bars as grab bars.  Use non-skid mats or decals on the floor of the tub or shower.  If you need to sit down while you are in the shower, use a plastic, non-slip stool.[employer]  Keep the floor dry. Immediately clean up any water that spills on the floor.  Remove soap buildup in the tub or shower on a regular basis.  Attach bath mats securely with double-sided non-slip rug tape.  Remove throw rugs and other tripping hazards from the floor. WHAT CAN I DO IN THE BEDROOM?  Use night lights.  Make sure that a bedside light is easy to reach.  Do not use oversized bedding that drapes onto the floor.  Have a firm chair that has side arms to use for getting dressed.  Remove throw rugs and other  tripping hazards from the floor. WHAT CAN I DO IN THE KITCHEN?   Clean up any spills right away.  Avoid walking on wet floors.  Place frequently used items in easy-to-reach places.  If you need to reach for something above you, use a sturdy step stool that has a grab bar.  Keep electrical cables out of the way.  Do not use floor polish or wax that makes floors slippery. If you have to use wax, make sure that it is non-skid floor wax.  Remove throw rugs and other tripping hazards from the floor. WHAT CAN I DO IN THE STAIRWAYS?  Do not leave any items on the stairs.  Make sure that there are handrails on both sides of the stairs. Fix handrails that are broken or loose. Make sure that handrails are as long as the stairways.  Check any carpeting to make sure that it is firmly attached to the stairs. Fix any carpet that is loose or worn.  Avoid having throw rugs at the top or bottom of stairways, or secure the rugs with carpet tape to prevent them from moving.  Make sure that you have a light switch at the top of the stairs and the bottom of the stairs. If you do not have them, have them installed. WHAT ARE SOME OTHER FALL PREVENTION TIPS?  Wear closed-toe shoes that  fit well and support your feet. Wear shoes that have rubber soles or low heels.  When you use a stepladder, make sure that it is completely opened and that the sides are firmly locked. Have someone hold the ladder while you are using it. Do not climb a closed stepladder.  Add color or contrast paint or tape to grab bars and handrails in your home. Place contrasting color strips on the first and last steps.  Use mobility aids as needed, such as canes, walkers, scooters, and crutches.  Turn on lights if it is dark. Replace any light bulbs that burn out.  Set up furniture so that there are clear paths. Keep the furniture in the same spot.  Fix any uneven floor surfaces.  Choose a carpet design that does not hide the  edge of steps of a stairway.  Be aware of any and all pets.  Review your medicines with your healthcare provider. Some medicines can cause dizziness or changes in blood pressure, which increase your risk of falling. Talk with your health care provider about other ways that you can decrease your risk of falls. This may include working with a physical therapist or trainer to improve your strength, balance, and endurance.   This information is not intended to replace advice given to you by your health care provider. Make sure you discuss any questions you have with your health care provider.   Document Released: 04/11/2002 Document Revised: 09/05/2014 Document Reviewed: 05/26/2014 Elsevier Interactive Patient Education 2016 [employer].               Exercise for [employer]  Exercise is important to build and maintain strong bones / bone density.  There are 2 types of exercises that are important to building and maintaining strong bones:  Weight- bearing and muscle-stregthening.  Weight-bearing Exercises  These exercises include activities that make you move against gravity while staying upright. Weight-bearing exercises can be high-impact or low-impact.  High-impact weight-bearing exercises help build bones and keep them strong. If you have broken a bone due to osteoporosis or are at risk of breaking a bone, you may need to avoid high-impact exercises. If you're not sure, you should check with your healthcare provider.  Examples of high-impact weight-bearing exercises are: Dancing  Doing high-impact aerobics  Hiking  Jogging/running  Jumping Rope  Stair climbing  Tennis  Low-impact weight-bearing exercises can also help keep bones strong and are a safe alternative if you cannot do high-impact exercises.   Examples of low-impact weight-bearing exercises are: Using elliptical training machines  Doing low-impact aerobics  Using stair-step machines  Fast walking on a treadmill or  outside   Muscle-Strengthening Exercises These exercises include activities where you move your body, a weight or some other resistance against gravity. They are also known as resistance exercises and include: Lifting weights  Using elastic exercise bands  Using weight machines  Lifting your own body weight  Functional movements, such as standing and rising up on your toes  Yoga and Pilates can also improve strength, balance and flexibility. However, certain positions may not be safe for people with osteoporosis or those at increased risk of broken bones. For example, exercises that have you bend forward may increase the chance of breaking a bone in the spine.   Non-Impact Exercises There are other types of exercises that can help prevent falls.  Non-impact exercises can help you to improve balance, posture and how well you move in everyday activities. Some of these  exercises include: Balance exercises that strengthen your legs and test your balance, such as Tai Chi, can decrease your risk of falls.  Posture exercises that improve your posture and reduce rounded or "sloping" shoulders can help you decrease the chance of breaking a bone, especially in the spine.  Functional exercises that improve how well you move can help you with everyday activities and decrease your chance of falling and breaking a bone. For example, if you have trouble getting up from a chair or climbing stairs, you should do these activities as exercises.   **A physical therapist can teach you balance, posture and functional exercises. He/she can also help you learn which exercises are safe and appropriate for you.  [facility] has a physical therapy office in [place] in front of our office and referrals can be made for assessments and treatment as needed and strength and balance training.  If you would like to have an assessment with [facility] and our physical therapy team please let a nurse or provider know.

## 2015-06-08 ENCOUNTER — Telehealth: Payer: Self-pay | Admitting: Family Medicine

## 2015-06-08 NOTE — Telephone Encounter (Signed)
Patient has received Invokana in mail that was prescribed 05/30/15 and she was not expecting this medication.  She has some questions about side effects and how it works.  Discussed these with her and she feel prepared to start Invokana.

## 2015-06-13 ENCOUNTER — Encounter: Payer: Self-pay | Admitting: Family Medicine

## 2015-06-13 ENCOUNTER — Ambulatory Visit (INDEPENDENT_AMBULATORY_CARE_PROVIDER_SITE_OTHER): Payer: Medicare HMO | Admitting: Family Medicine

## 2015-06-13 VITALS — BP 130/62 | HR 77 | Temp 98.1°F | Ht 59.0 in | Wt 113.2 lb

## 2015-06-13 DIAGNOSIS — R0789 Other chest pain: Secondary | ICD-10-CM

## 2015-06-13 DIAGNOSIS — R0781 Pleurodynia: Secondary | ICD-10-CM

## 2015-06-13 LAB — POCT URINALYSIS DIPSTICK
BILIRUBIN UA: NEGATIVE
Blood, UA: NEGATIVE
LEUKOCYTES UA: NEGATIVE
NITRITE UA: NEGATIVE
PH UA: 5
Protein, UA: NEGATIVE
Spec Grav, UA: 1.015
Urobilinogen, UA: NEGATIVE

## 2015-06-13 LAB — POCT UA - MICROSCOPIC ONLY
CASTS, UR, LPF, POC: NEGATIVE
CRYSTALS, UR, HPF, POC: NEGATIVE
RBC, URINE, MICROSCOPIC: NEGATIVE
Yeast, UA: NEGATIVE

## 2015-06-13 NOTE — Progress Notes (Signed)
BP 130/62 mmHg  Pulse 77  Temp(Src) 98.1 F (36.7 C) (Oral)  Ht  (1.499 m)  Wt 113 lb 3.2 oz (51.347 kg)  BMI 22.85 kg/m2   Subjective:    Patient ID: Ellen Woods, female    DOB: 18-Oct-1940, 75 y.o.   MRN: [id number]  HPI: Ellen Woods is a 75 y.o. female presenting on 06/13/2015 for Right sided rib pain   HPI Right-sided rib pain Patient has been having right side rib pain that started yesterday and has gotten worse over today. It is worse with movement and twisting she feels a lot of sharp pains there. When she is just resting she doesn't necessary feel any pain. She denies any shortness of breath or cough or wheezing or upper respiratory symptoms. She is concerned that this could be related to her Forteo because she heard it can cause fractures. She also has severe osteoporosis and was concerned for that. She denies any trauma from falling or hitting her ribs or anything along those lines. She is having that pain along the right side. She denies any overlying skin changes or redness or warmth.  Relevant past medical, surgical, family and social history reviewed and updated as indicated. Interim medical history since our last visit reviewed. Allergies and medications reviewed and updated.  Review of Systems  Constitutional: Negative for fever and chills.  HENT: Negative for congestion, ear discharge and ear pain.   Eyes: Negative for redness and visual disturbance.  Respiratory: Negative for cough, chest tightness and shortness of breath.   Cardiovascular: Negative for chest pain and leg swelling.  Genitourinary: Negative for dysuria and difficulty urinating.  Musculoskeletal: Positive for arthralgias (Chest wall pain). Negative for back pain and gait problem.  Skin: Negative for rash.  Neurological: Negative for light-headedness and headaches.  Psychiatric/Behavioral: Negative for behavioral problems and agitation.  All other systems reviewed and are  negative.   Per HPI unless specifically indicated above     Medication List       This list is accurate as of: 06/13/15  4:53 PM.  Always use your most recent med list.               ACCU-CHEK FASTCLIX LANCETS Misc  USE FOR FINGERSTICK BLOOD SUGAR TESTING SIX TIMES DAILY     acetaminophen 325 MG tablet  Commonly known as:  TYLENOL  Take 2 tablets (650 mg total) by mouth every 6 (six) hours as needed.     ALPRAZolam 0.5 MG tablet  Commonly known as:  XANAX  TAKE 1 TABLET TWICE DAILY     aspirin 81 MG tablet  Take 81 mg by mouth daily.     B-D SINGLE USE SWABS REGULAR Pads  USE FOR FINGERSTICK BLOOD SUGAR TESTING SIX TIMES DAILY     Calcium Carb-Cholecalciferol 500-600 MG-UNIT Tabs  Commonly known as:  CALCIUM 500 + D3  Take 1 tablet by mouth daily.     canagliflozin 300 MG Tabs tablet  Commonly known as:  INVOKANA  Take 1 tablet (300 mg total) by mouth daily before breakfast. For diabetes     ciclopirox 8 % solution  Commonly known as:  PENLAC  Apply topically at bedtime. Apply over nail and surrounding skin. Apply daily over previous coat. After seven (7) days, may remove with alcohol and continue cycle.     DSS 100 MG Caps  Take 100 mg by mouth 2 (two) times daily.     furosemide 20 MG tablet  Commonly known as:  LASIX  Take 1 tablet (20 mg total) by mouth daily.     glucose blood test strip  Commonly known as:  ACCU-CHEK SMARTVIEW  USE FOR FINGERSTICK BLOOD SUGAR TESTING SIX TIMES DAILY     HYDROcodone-acetaminophen 5-325 MG tablet  Commonly known as:  NORCO/VICODIN  Take 1 tablet by mouth 2 (two) times daily as needed for moderate pain.     insulin aspart 100 UNIT/ML injection  Commonly known as:  novoLOG  Inject 2-10 Units into the skin 3 (three) times daily before meals.     insulin glargine 100 UNIT/ML injection  Commonly known as:  LANTUS  Inject 0.13 mLs (13 Units total) into the skin every morning.     lisinopril 20 MG tablet  Commonly known  as:  PRINIVIL,ZESTRIL  Take 1 tablet (20 mg total) by mouth daily.     metoprolol tartrate 25 MG tablet  Commonly known as:  LOPRESSOR  TAKE 1 TABLET TWICE DAILY     mometasone 50 MCG/ACT nasal spray  Commonly known as:  NASONEX  Place 2 sprays into the nose daily.     montelukast 10 MG tablet  Commonly known as:  SINGULAIR  Take 1 tablet (10 mg total) by mouth at bedtime.     multivitamin tablet  Take 1 tablet by mouth daily.     NITROSTAT 0.4 MG SL tablet  Generic drug:  nitroGLYCERIN  Place 1 tablet (0.4 mg total) under the tongue every 5 (five) minutes as needed for chest pain.     potassium chloride SA 20 MEQ tablet  Commonly known as:  K-DUR,KLOR-CON  TAKE 1 TABLET EVERY DAY     Teriparatide (Recombinant) 600 MCG/2.4ML Soln  Commonly known as:  FORTEO  Inject 0.08 mLs (20 mcg total) into the skin daily.           Objective:    BP 130/62 mmHg  Pulse 77  Temp(Src) 98.1 F (36.7 C) (Oral)  Ht  (1.499 m)  Wt 113 lb 3.2 oz (51.347 kg)  BMI 22.85 kg/m2  Wt Readings from Last 3 Encounters:  06/13/15 113 lb 3.2 oz (51.347 kg)  06/06/15 118 lb 8 oz (53.751 kg)  05/29/15 118 lb (53.524 kg)    Physical Exam  Constitutional: She is oriented to person, place, and time. She appears well-developed and well-nourished. No distress.  Eyes: Conjunctivae and EOM are normal. Pupils are equal, round, and reactive to light.  Cardiovascular: Normal rate, regular rhythm, normal heart sounds and intact distal pulses.   No murmur heard. Pulmonary/Chest: Effort normal and breath sounds normal. No respiratory distress. She has no wheezes.  Musculoskeletal: Normal range of motion. She exhibits no edema.       Thoracic back: She exhibits tenderness. She exhibits normal range of motion and no bony tenderness.       Back:  Neurological: She is alert and oriented to person, place, and time. Coordination normal.  Skin: Skin is warm and dry. No rash noted. She is not diaphoretic.   Psychiatric: She has a normal mood and affect. Her behavior is normal.  Nursing note and vitals reviewed.     Assessment & Plan:   Problem List Items Addressed This Visit    None    Visit Diagnoses    Rib pain on right side    -  Primary    Likely muscular, if not improved after 4-5 days of anti-inflammatories and come back and we can try an  x-ray. test urine    Relevant Orders    POCT urinalysis dipstick (Completed)    POCT UA - Microscopic Only (Completed)    PTH, Intact and Calcium (Completed)        Follow up plan: Return if symptoms worsen or fail to improve.  Counseling provided for all of the vaccine components Orders Placed This Encounter  Procedures  . PTH, Intact and Calcium  . POCT urinalysis dipstick  . POCT UA - Microscopic Only    Arville Care, MD [facility] 06/13/2015, 4:53 PM

## 2015-06-15 ENCOUNTER — Telehealth: Payer: Self-pay | Admitting: Family Medicine

## 2015-06-15 NOTE — Telephone Encounter (Signed)
Patient informed we have not gotten lab results back yet but that when we do we will contact her

## 2015-06-16 LAB — PTH, INTACT AND CALCIUM
Calcium: 9.8 mg/dL (ref 8.7–10.3)
PTH: 34 pg/mL (ref 15–65)

## 2015-06-18 ENCOUNTER — Telehealth: Payer: Self-pay | Admitting: Pharmacist

## 2015-06-18 NOTE — Telephone Encounter (Signed)
Patient feels that Forteo has increased BG and is causing bone pain / weakness.  I don't think it is affecting her BG but could be causing bone pain.  Recommend hold for Forteo 1 week.  Also increase Lantus to 15 units daily.  Has follow up with PCP 07/04/15 but is to call office if she continues to have BG over 200 or continued pain.

## 2015-06-29 ENCOUNTER — Telehealth: Payer: Self-pay | Admitting: Pharmacist

## 2015-06-29 NOTE — Telephone Encounter (Signed)
Bone pain resolved since stopping forteo Patient also reports she BG has decreased - taking Lantus 16 units daily.

## 2015-07-04 ENCOUNTER — Ambulatory Visit (INDEPENDENT_AMBULATORY_CARE_PROVIDER_SITE_OTHER): Payer: Medicare HMO | Admitting: Family Medicine

## 2015-07-04 ENCOUNTER — Encounter: Payer: Self-pay | Admitting: Family Medicine

## 2015-07-04 VITALS — BP 174/72 | HR 83 | Temp 97.0°F | Ht 59.0 in | Wt 115.0 lb

## 2015-07-04 DIAGNOSIS — E039 Hypothyroidism, unspecified: Secondary | ICD-10-CM | POA: Diagnosis not present

## 2015-07-04 DIAGNOSIS — Z794 Long term (current) use of insulin: Secondary | ICD-10-CM

## 2015-07-04 DIAGNOSIS — I10 Essential (primary) hypertension: Secondary | ICD-10-CM

## 2015-07-04 DIAGNOSIS — E785 Hyperlipidemia, unspecified: Secondary | ICD-10-CM

## 2015-07-04 DIAGNOSIS — M80052D Age-related osteoporosis with current pathological fracture, left femur, subsequent encounter for fracture with routine healing: Secondary | ICD-10-CM

## 2015-07-04 DIAGNOSIS — E11649 Type 2 diabetes mellitus with hypoglycemia without coma: Secondary | ICD-10-CM

## 2015-07-04 DIAGNOSIS — I480 Paroxysmal atrial fibrillation: Secondary | ICD-10-CM | POA: Diagnosis not present

## 2015-07-04 DIAGNOSIS — M810 Age-related osteoporosis without current pathological fracture: Secondary | ICD-10-CM | POA: Diagnosis not present

## 2015-07-04 MED ORDER — ZOLEDRONIC ACID 5 MG/100ML IV SOLN: 5.0000 mg | Freq: Once | INTRAVENOUS | Status: DC

## 2015-07-04 NOTE — Progress Notes (Signed)
Subjective:  Patient ID: Ellen Woods, female    DOB: 10/31/1940  Age: 75 y.o. MRN: [id number]  CC: Hypertension   HPI Ellen Woods presents for  follow-up of hypertension. Also DCed the diabetic pill - made sugars too high. Started back on lantus 16 units daily. Also using 3-5 units novolog before meals. Stopped taking Forteo.Had palpitations.  Also reports rash on abd. Wants to go back to the pills. Also interested in [place] has a past medical history of Unspecified essential hypertension; Type II or unspecified type diabetes mellitus without mention of complication, not stated as uncontrolled; Coronary atherosclerosis of native coronary artery; Encounter for long-term (current) use of insulin ([place]); PDA (patent ductus arteriosus); Pericardial calcification; Thyroid disease; Arthritis; Back pain (1/14); Family history of anesthesia complication; and COPD (chronic obstructive pulmonary disease) ([place]).   She has past surgical history that includes Incisional hernia repair; Abdominal hysterectomy; Tubal ligation; and Intramedullary (im) nail intertrochanteric (Left, 11/13/2012).   Her family history includes Stroke in her mother.She reports that she has never smoked. She has never used smokeless tobacco. She reports that she does not drink alcohol or use illicit drugs.  Current Outpatient Prescriptions on File Prior to Visit  Medication Sig Dispense Refill  . ACCU-CHEK FASTCLIX LANCETS MISC USE FOR FINGERSTICK BLOOD SUGAR TESTING SIX TIMES DAILY 612 each 1  . acetaminophen (TYLENOL) 325 MG tablet Take 2 tablets (650 mg total) by mouth every 6 (six) hours as needed. (Patient taking differently: Take 650 mg by mouth every 6 (six) hours as needed for moderate pain. )    . Alcohol Swabs (B-D SINGLE USE SWABS REGULAR) PADS USE FOR FINGERSTICK BLOOD SUGAR TESTING SIX TIMES DAILY 600 each 2  . ALPRAZolam (XANAX) 0.5 MG tablet TAKE 1 TABLET TWICE DAILY 180 tablet 0  . aspirin 81  MG tablet Take 81 mg by mouth daily.    . Calcium Carb-Cholecalciferol (CALCIUM 500 + D3) 500-600 MG-UNIT TABS Take 1 tablet by mouth daily. 60 tablet   . ciclopirox (PENLAC) 8 % solution Apply topically at bedtime. Apply over nail and surrounding skin. Apply daily over previous coat. After seven (7) days, may remove with alcohol and continue cycle. 6.6 mL 0  . docusate sodium 100 MG CAPS Take 100 mg by mouth 2 (two) times daily. 60 capsule 0  . furosemide (LASIX) 20 MG tablet Take 1 tablet (20 mg total) by mouth daily. 90 tablet 1  . glucose blood (ACCU-CHEK SMARTVIEW) test strip USE FOR FINGERSTICK BLOOD SUGAR TESTING SIX TIMES DAILY 600 each 2  . insulin aspart (NOVOLOG) 100 UNIT/ML injection Inject 2-10 Units into the skin 3 (three) times daily before meals.    . insulin glargine (LANTUS) 100 UNIT/ML injection Inject 0.13 mLs (13 Units total) into the skin every morning. (Patient taking differently: Inject 16 Units into the skin every morning. ) 30 mL 1  . lisinopril (PRINIVIL,ZESTRIL) 20 MG tablet Take 1 tablet (20 mg total) by mouth daily. 90 tablet 3  . metoprolol tartrate (LOPRESSOR) 25 MG tablet TAKE 1 TABLET TWICE DAILY 180 tablet 1  . mometasone (NASONEX) 50 MCG/ACT nasal spray Place 2 sprays into the nose daily. 17 g 12  . montelukast (SINGULAIR) 10 MG tablet Take 1 tablet (10 mg total) by mouth at bedtime. 30 tablet 3  . Multiple Vitamin (MULTIVITAMIN) tablet Take 1 tablet by mouth daily.    Marland Kitchen NITROSTAT 0.4 MG SL tablet Place 1 tablet (0.4 mg total) under the tongue every  5 (five) minutes as needed for chest pain. 20 tablet 0  . potassium chloride SA (K-DUR,KLOR-CON) 20 MEQ tablet TAKE 1 TABLET EVERY DAY 90 tablet 0  . canagliflozin (INVOKANA) 300 MG TABS tablet Take 1 tablet (300 mg total) by mouth daily before breakfast. For diabetes (Patient not taking: Reported on 07/04/2015) 90 tablet 1  . HYDROcodone-acetaminophen (NORCO/VICODIN) 5-325 MG tablet Take 1 tablet by mouth 2 (two) times  daily as needed for moderate pain. Reported on 07/04/2015    . Teriparatide, Recombinant, (FORTEO) 600 MCG/2.4ML SOLN Inject 0.08 mLs (20 mcg total) into the skin daily. (Patient not taking: Reported on 07/04/2015) 2.4 mL 5   No current facility-administered medications on file prior to visit.    ROS Review of Systems  Constitutional: Negative for fever, activity change and appetite change.  HENT: Negative for congestion, rhinorrhea and sore throat.   Eyes: Negative for visual disturbance.  Respiratory: Negative for cough and shortness of breath.   Cardiovascular: Negative for chest pain and palpitations.  Gastrointestinal: Negative for nausea, abdominal pain and diarrhea.  Genitourinary: Negative for dysuria.  Musculoskeletal: Negative for myalgias and arthralgias.    Objective:  BP 174/72 mmHg  Pulse 83  Temp(Src) 97 F (36.1 C) (Oral)  Ht 4' 11"  (1.499 m)  Wt 115 lb (52.164 kg)  BMI 23.21 kg/m2  SpO2 100%  BP Readings from Last 3 Encounters:  07/04/15 174/72  06/13/15 130/62  06/06/15 156/68    Wt Readings from Last 3 Encounters:  07/04/15 115 lb (52.164 kg)  06/13/15 113 lb 3.2 oz (51.347 kg)  06/06/15 118 lb 8 oz (53.751 kg)     Physical Exam  Constitutional: She is oriented to person, place, and time. She appears well-developed and well-nourished. No distress.  HENT:  Head: Normocephalic and atraumatic.  Right Ear: External ear normal.  Left Ear: External ear normal.  Nose: Nose normal.  Mouth/Throat: Oropharynx is clear and moist.  Eyes: Conjunctivae and EOM are normal. Pupils are equal, round, and reactive to light.  Neck: Normal range of motion. Neck supple. No thyromegaly present.  Cardiovascular: Normal rate, regular rhythm and normal heart sounds.   No murmur heard. Pulmonary/Chest: Effort normal and breath sounds normal. No respiratory distress. She has no wheezes. She has no rales.  Abdominal: Soft. Bowel sounds are normal. She exhibits no distension.  There is no tenderness.  Lymphadenopathy:    She has no cervical adenopathy.  Neurological: She is alert and oriented to person, place, and time. She has normal reflexes.  Skin: Skin is warm and dry.  Psychiatric: She has a normal mood and affect. Her behavior is normal. Judgment and thought content normal.     Lab Results  Component Value Date   WBC 5.9 05/29/2015   HGB 12.8 10/13/2014   HCT 38.2 05/29/2015   PLT 264 05/29/2015   GLUCOSE 118* 05/29/2015   CHOL 226* 05/29/2015   TRIG 102 05/29/2015   HDL 74 05/29/2015   LDLCALC 132* 05/29/2015   ALT 22 05/29/2015   AST 29 05/29/2015   NA 143 05/29/2015   K 5.2 05/29/2015   CL 101 05/29/2015   CREATININE 0.99 05/29/2015   BUN 23 05/29/2015   CO2 26 05/29/2015   TSH 3.800 03/14/2014   INR 1.01 11/12/2012   HGBA1C 8.2 05/29/2015    Ct Head Wo Contrast  03/14/2014  CLINICAL DATA:  75 year old diabetic female with confusion. Initial encounter. EXAM: CT HEAD WITHOUT CONTRAST TECHNIQUE: Contiguous axial images were obtained from  the base of the skull through the vertex without intravenous contrast. COMPARISON:  08/15/2003 MR.  No comparison CT. FINDINGS: Exam is slightly motion degraded. No intracranial hemorrhage. Small vessel disease type changes without CT evidence of large acute infarct. Atrophy without hydrocephalus. No intracranial mass lesion noted on this unenhanced exam. Vascular calcifications. No intracranial mass lesion noted on this unenhanced exam. IMPRESSION: Exam is slightly motion degraded. No intracranial hemorrhage. Small vessel disease type changes without CT evidence of large acute infarct. Atrophy without hydrocephalus. Electronically Signed   By: Chauncey Cruel M.D.   On: 03/14/2014 13:10   Dg Chest Port 1 View  03/14/2014  CLINICAL DATA:  Altered mental status, hypoglycemia. COPD, diabetic. EXAM: PORTABLE CHEST - 1 VIEW COMPARISON:  11/12/2012 . FINDINGS: Mild cardiomegaly. Diffuse interstitial prominence  throughout the lungs has increased since prior study. This could reflect mild interstitial edema. No confluent opacities or effusions. No acute bony abnormality. IMPRESSION: Mild diffuse interstitial prominence throughout the lungs, question interstitial edema. Electronically Signed   By: Rolm Baptise M.D.   On: 03/14/2014 12:48   Ct Cta Abd/pel W/cm &/or W/o Cm  03/14/2014  CLINICAL DATA:  Abdominal pain. Elevated lactate. Rule out mesenteric ischemia. EXAM: CTA ABDOMEN AND PELVIS wITHOUT AND WITH CONTRAST TECHNIQUE: Multidetector CT imaging of the abdomen and pelvis was performed using the standard protocol during bolus administration of intravenous contrast. Multiplanar reconstructed images and MIPs were obtained and reviewed to evaluate the vascular anatomy. CONTRAST:  105m OMNIPAQUE IOHEXOL 350 MG/ML SOLN COMPARISON:  None. FINDINGS: The aorta is heavily calcified. Superior mesenteric artery and celiac artery are widely patent without stenosis. Inferior mesenteric artery is stenotic at its origin but patent. Single left renal arteries widely patent. 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 The right renal artery arises from the aorta at the bifurcation without visible stenosis. Calcifications within the liver and spleen compatible with old granulomatous disease. Gallbladder, pancreas, adrenals and left kidney are unremarkable. Again, right kidney is within the pelvis. Areas of cortical loss and scarring within the right kidney. Appendix is visualized and is normal. Stomach and small bowel decompressed, grossly unremarkable. The rectum is dilated and stool filled. There is mild rectal wall thickening posteriorly and significant edema/ stranding in the presacral space and around the lateral walls of the rectum. Findings suggestive of proctitis. Probable fecal impaction as well. No free fluid, free air or adenopathy. Prior hysterectomy. Urinary bladder is decompressed with Foley catheter in place.  No acute bony abnormality or focal bone lesion. Cardiomegaly. Calcifications within the posterior pericardium compatible with calcific pericarditis. Left lower lobe atelectasis or early infiltrate. Right lung is clear. No effusions. Review of the MIP images confirms the above findings. IMPRESSION: Dilated stool-filled rectum suggesting fecal impaction. There is also mild wall thickening in the rectum and stranding in the perirectal soft tissues. Findings suggestive of proctitis. Right pelvic kidney. No evidence of bowel ischemia. Old granulomas disease in the liver and spleen. Calcific pericarditis.  Cardiomegaly. Left lower lobe atelectasis or infiltrate. Electronically Signed   By: KRolm BaptiseM.D.   On: 03/14/2014 15:46    Assessment & Plan:   [place] seen today for hypertension.  Diagnoses and all orders for this visit:  Uncontrolled type 2 diabetes mellitus with hypoglycemia without coma, with long-term current use of insulin (HCC) -     CMP14+EGFR  Essential hypertension, benign -     CMP14+EGFR  Hyperlipidemia with target LDL less than 100 -  CMP14+EGFR  PAF (paroxysmal atrial fibrillation) (HCC) -     CMP14+EGFR  Hypothyroidism, unspecified hypothyroidism type -     CMP14+EGFR  Pathological fracture of left femur due to age-related osteoporosis with routine healing -     CMP14+EGFR  Osteoporosis -     CMP14+EGFR -     zoledronic acid (RECLAST) 5 MG/100ML SOLN injection; Inject 100 mLs (5 mg total) into the vein once.   I am having Ms. Strohmeier start on zoledronic acid. I am also having her maintain her multivitamin, acetaminophen, aspirin, ACCU-CHEK FASTCLIX LANCETS, DSS, furosemide, glucose blood, B-D SINGLE USE SWABS REGULAR, lisinopril, ALPRAZolam, NITROSTAT, ciclopirox, potassium chloride SA, montelukast, mometasone, metoprolol tartrate, Teriparatide (Recombinant), canagliflozin, insulin glargine, insulin aspart, Calcium Carb-Cholecalciferol, and  HYDROcodone-acetaminophen.  Meds ordered this encounter  Medications  . zoledronic acid (RECLAST) 5 MG/100ML SOLN injection    Sig: Inject 100 mLs (5 mg total) into the vein once.    Dispense:  100 mL    Refill:  0    reclast ordered for osteoporosis & compliance issues. Low salt diet for HTN  Follow-up: Return in about 2 months (around 09/03/2015).  Claretta Fraise, M.D.

## 2015-07-05 LAB — CMP14+EGFR
A/G RATIO: 1.7 (ref 1.1–2.5)
ALT: 25 IU/L (ref 0–32)
AST: 32 IU/L (ref 0–40)
Albumin: 4.3 g/dL (ref 3.5–4.8)
Alkaline Phosphatase: 76 IU/L (ref 39–117)
BUN/Creatinine Ratio: 24 (ref 11–26)
BUN: 29 mg/dL — ABNORMAL HIGH (ref 8–27)
Bilirubin Total: 0.7 mg/dL (ref 0.0–1.2)
CALCIUM: 9.5 mg/dL (ref 8.7–10.3)
CHLORIDE: 100 mmol/L (ref 96–106)
CO2: 22 mmol/L (ref 18–29)
Creatinine, Ser: 1.22 mg/dL — ABNORMAL HIGH (ref 0.57–1.00)
GFR calc Af Amer: 50 mL/min/{1.73_m2} — ABNORMAL LOW (ref >59)
GFR, EST NON AFRICAN AMERICAN: 44 mL/min/{1.73_m2} — AB (ref >59)
Globulin, Total: 2.5 g/dL (ref 1.5–4.5)
Glucose: 191 mg/dL — ABNORMAL HIGH (ref 65–99)
POTASSIUM: 5.3 mmol/L — AB (ref 3.5–5.2)
SODIUM: 140 mmol/L (ref 134–144)
TOTAL PROTEIN: 6.8 g/dL (ref 6.0–8.5)

## 2015-08-06 ENCOUNTER — Encounter: Payer: Self-pay | Admitting: Family Medicine

## 2015-08-06 ENCOUNTER — Encounter (INDEPENDENT_AMBULATORY_CARE_PROVIDER_SITE_OTHER): Payer: Self-pay

## 2015-08-06 ENCOUNTER — Ambulatory Visit (INDEPENDENT_AMBULATORY_CARE_PROVIDER_SITE_OTHER): Payer: Medicare HMO | Admitting: Family Medicine

## 2015-08-06 VITALS — BP 151/57 | HR 70 | Temp 97.1°F | Ht 59.0 in | Wt 120.6 lb

## 2015-08-06 DIAGNOSIS — E11649 Type 2 diabetes mellitus with hypoglycemia without coma: Secondary | ICD-10-CM | POA: Diagnosis not present

## 2015-08-06 DIAGNOSIS — I10 Essential (primary) hypertension: Secondary | ICD-10-CM | POA: Diagnosis not present

## 2015-08-06 DIAGNOSIS — R740 Nonspecific elevation of levels of transaminase and lactic acid dehydrogenase [LDH]: Secondary | ICD-10-CM

## 2015-08-06 DIAGNOSIS — E785 Hyperlipidemia, unspecified: Secondary | ICD-10-CM | POA: Diagnosis not present

## 2015-08-06 DIAGNOSIS — Z794 Long term (current) use of insulin: Secondary | ICD-10-CM | POA: Diagnosis not present

## 2015-08-06 DIAGNOSIS — F411 Generalized anxiety disorder: Secondary | ICD-10-CM

## 2015-08-06 DIAGNOSIS — R7401 Elevation of levels of liver transaminase levels: Secondary | ICD-10-CM

## 2015-08-06 DIAGNOSIS — E039 Hypothyroidism, unspecified: Secondary | ICD-10-CM

## 2015-08-06 MED ORDER — ACCU-CHEK FASTCLIX LANCETS MISC: Status: DC

## 2015-08-06 MED ORDER — INSULIN GLARGINE 100 UNIT/ML ~~LOC~~ SOLN: 20.0000 [IU] | Freq: Every morning | SUBCUTANEOUS | Status: DC

## 2015-08-06 MED ORDER — DENOSUMAB 60 MG/ML ~~LOC~~ SOLN: 60.0000 mg | Freq: Once | SUBCUTANEOUS | Status: DC

## 2015-08-06 MED ORDER — DULOXETINE HCL 30 MG PO CPEP: 30.0000 mg | ORAL_CAPSULE | Freq: Every day | ORAL | Status: DC

## 2015-08-06 NOTE — Progress Notes (Signed)
Subjective:  Patient ID: Ellen Woods, female    DOB: July 31, 1940  Age: 75 y.o. MRN: [id number]  CC: Hypertension; Diabetes; and GAD   HPI [name] presents for before meals uses 3 units of novolog if glucose is over 200. DID NOT RETURN LOG. Pt taking 16 units of lantus q AM. Concerned about hypoglycemia occurring early in a.m. at times. About once a week.   GAD 7 : Generalized Anxiety Score 08/06/2015 05/29/2015  Nervous, Anxious, on Edge 3 2  Control/stop worrying 2 2  Worry too much - different things 1 1  Trouble relaxing 3 0  Restless 2 0  Easily annoyed or irritable 1 0  Afraid - awful might happen 1 0  Total GAD 7 Score 13 5  Anxiety Difficulty Not difficult at all Not difficult at all    Anxiety getting worse. See above. States she is forgetful.   follow-up of hypertension. Patient has no history of headache chest pain or shortness of breath or recent cough. Patient also denies symptoms of TIA such as numbness weakness lateralizing. Patient checks  blood pressure at home and has not had any elevated readings recently. Patient denies side effects from his medication. States taking it regularly.    History [name] has a past medical history of Unspecified essential hypertension; Type II or unspecified type diabetes mellitus without mention of complication, not stated as uncontrolled; Coronary atherosclerosis of native coronary artery; Encounter for long-term (current) use of insulin ([facility]); PDA (patent ductus arteriosus); Pericardial calcification; Thyroid disease; Arthritis; Back pain (1/14); Family history of anesthesia complication; and COPD (chronic obstructive pulmonary disease) ([facility]).   She has past surgical history that includes Incisional hernia repair; Abdominal hysterectomy; Tubal ligation; and Intramedullary (im) nail intertrochanteric (Left, 11/13/2012).   Her family history includes Stroke in her mother.She reports that she has never smoked. She has never  used smokeless tobacco. She reports that she does not drink alcohol or use illicit drugs.  Current Outpatient Prescriptions on File Prior to Visit  Medication Sig Dispense Refill  . acetaminophen (TYLENOL) 325 MG tablet Take 2 tablets (650 mg total) by mouth every 6 (six) hours as needed. (Patient taking differently: Take 650 mg by mouth every 6 (six) hours as needed for moderate pain. )    . Alcohol Swabs (B-D SINGLE USE SWABS REGULAR) PADS USE FOR FINGERSTICK BLOOD SUGAR TESTING SIX TIMES DAILY 600 each 2  . ALPRAZolam (XANAX) 0.5 MG tablet TAKE 1 TABLET TWICE DAILY 180 tablet 0  . aspirin 81 MG tablet Take 81 mg by mouth daily.    . Calcium Carb-Cholecalciferol (CALCIUM 500 + D3) 500-600 MG-UNIT TABS Take 1 tablet by mouth daily. 60 tablet   . ciclopirox (PENLAC) 8 % solution Apply topically at bedtime. Apply over nail and surrounding skin. Apply daily over previous coat. After seven (7) days, may remove with alcohol and continue cycle. 6.6 mL 0  . docusate sodium 100 MG CAPS Take 100 mg by mouth 2 (two) times daily. 60 capsule 0  . furosemide (LASIX) 20 MG tablet Take 1 tablet (20 mg total) by mouth daily. 90 tablet 1  . glucose blood (ACCU-CHEK SMARTVIEW) test strip USE FOR FINGERSTICK BLOOD SUGAR TESTING SIX TIMES DAILY 600 each 2  . HYDROcodone-acetaminophen (NORCO/VICODIN) 5-325 MG tablet Take 1 tablet by mouth 2 (two) times daily as needed for moderate pain. Reported on 07/04/2015    . insulin aspart (NOVOLOG) 100 UNIT/ML injection Inject 2-10 Units into the skin 3 (three)  times daily before meals.    Marland Kitchen. lisinopril (PRINIVIL,ZESTRIL) 20 MG tablet Take 1 tablet (20 mg total) by mouth daily. 90 tablet 3  . metoprolol tartrate (LOPRESSOR) 25 MG tablet TAKE 1 TABLET TWICE DAILY 180 tablet 1  . mometasone (NASONEX) 50 MCG/ACT nasal spray Place 2 sprays into the nose daily. 17 g 12  . montelukast (SINGULAIR) 10 MG tablet Take 1 tablet (10 mg total) by mouth at bedtime. 30 tablet 3  . Multiple  Vitamin (MULTIVITAMIN) tablet Take 1 tablet by mouth daily.    Marland Kitchen. NITROSTAT 0.4 MG SL tablet Place 1 tablet (0.4 mg total) under the tongue every 5 (five) minutes as needed for chest pain. 20 tablet 0  . potassium chloride SA (K-DUR,KLOR-CON) 20 MEQ tablet TAKE 1 TABLET EVERY DAY 90 tablet 0  . canagliflozin (INVOKANA) 300 MG TABS tablet Take 1 tablet (300 mg total) by mouth daily before breakfast. For diabetes (Patient not taking: Reported on 07/04/2015) 90 tablet 1   No current facility-administered medications on file prior to visit.     ROS Review of Systems  Constitutional: Negative for fever, activity change and appetite change.  HENT: Negative for congestion, rhinorrhea and sore throat.   Eyes: Negative for visual disturbance.  Respiratory: Negative for cough and shortness of breath.   Cardiovascular: Negative for chest pain and palpitations.  Gastrointestinal: Negative for nausea, abdominal pain and diarrhea.  Genitourinary: Negative for dysuria.  Musculoskeletal: Negative for myalgias and arthralgias.    Objective:  BP 151/57 mmHg  Pulse 70  Temp(Src) 97.1 F (36.2 C) (Oral)  Ht 4\' 11"  (1.499 m)  Wt 120 lb 9.6 oz (54.704 kg)  BMI 24.35 kg/m2  SpO2 98%  BP Readings from Last 3 Encounters:  08/06/15 151/57  07/04/15 174/72  06/13/15 130/62    Wt Readings from Last 3 Encounters:  08/06/15 120 lb 9.6 oz (54.704 kg)  07/04/15 115 lb (52.164 kg)  06/13/15 113 lb 3.2 oz (51.347 kg)     Physical Exam  Constitutional: She is oriented to person, place, and time. She appears well-developed and well-nourished. No distress.  HENT:  Head: Normocephalic and atraumatic.  Right Ear: External ear normal.  Left Ear: External ear normal.  Nose: Nose normal.  Mouth/Throat: Oropharynx is clear and moist.  Eyes: Conjunctivae and EOM are normal. Pupils are equal, round, and reactive to light.  Neck: Normal range of motion. Neck supple. No thyromegaly present.  Cardiovascular:  Normal rate, regular rhythm and normal heart sounds.   No murmur heard. Pulmonary/Chest: Effort normal and breath sounds normal. No respiratory distress. She has no wheezes. She has no rales.  Abdominal: Soft. Bowel sounds are normal. She exhibits no distension. There is no tenderness.  Lymphadenopathy:    She has no cervical adenopathy.  Neurological: She is alert and oriented to person, place, and time. She has normal reflexes.  Skin: Skin is warm and dry.  Psychiatric: She has a normal mood and affect. Her speech is normal and behavior is normal. Thought content is not delusional. Cognition and memory are impaired. She expresses inappropriate judgment.     Lab Results  Component Value Date   WBC 5.9 05/29/2015   HGB 12.8 10/13/2014   HCT 38.2 05/29/2015   PLT 264 05/29/2015   GLUCOSE 191* 07/04/2015   CHOL 226* 05/29/2015   TRIG 102 05/29/2015   HDL 74 05/29/2015   LDLCALC 132* 05/29/2015   ALT 25 07/04/2015   AST 32 07/04/2015   NA 140  07/04/2015   K 5.3* 07/04/2015   CL 100 07/04/2015   CREATININE 1.22* 07/04/2015   BUN 29* 07/04/2015   CO2 22 07/04/2015   TSH 3.800 03/14/2014   INR 1.01 11/12/2012   HGBA1C 8.2 05/29/2015       Assessment & Plan:   Lasondra was seen today for hypertension, diabetes and gad.  Diagnoses and all orders for this visit:  Uncontrolled type 2 diabetes mellitus with hypoglycemia without coma, with long-term current use of insulin (HCC)  Diabetic hypoglycemia ([facility])  Essential hypertension, benign  Hyperlipidemia with target LDL less than 100  Hypothyroidism, unspecified hypothyroidism type  Transaminitis  GAD (generalized anxiety disorder)  Other orders -     DULoxetine (CYMBALTA) 30 MG capsule; Take 1 capsule (30 mg total) by mouth daily. -     ACCU-CHEK FASTCLIX LANCETS MISC; USE FOR FINGERSTICK BLOOD SUGAR TESTING SIX TIMES DAILY -     insulin glargine (LANTUS) 100 UNIT/ML injection; Inject 0.2 mLs (20 Units total) into the  skin every morning. -     denosumab (PROLIA) 60 MG/ML SOLN injection; Inject 60 mg into the skin once. Administer in upper arm, thigh, or abdomen   I have discontinued Ms. Whetsell's Teriparatide (Recombinant) and zoledronic acid. I have also changed her insulin glargine. Additionally, I am having her start on DULoxetine and denosumab. Lastly, I am having her maintain her multivitamin, acetaminophen, aspirin, DSS, furosemide, glucose blood, B-D SINGLE USE SWABS REGULAR, lisinopril, ALPRAZolam, NITROSTAT, ciclopirox, potassium chloride SA, montelukast, mometasone, metoprolol tartrate, canagliflozin, insulin aspart, Calcium Carb-Cholecalciferol, HYDROcodone-acetaminophen, and ACCU-CHEK FASTCLIX LANCETS.  Meds ordered this encounter  Medications  . DULoxetine (CYMBALTA) 30 MG capsule    Sig: Take 1 capsule (30 mg total) by mouth daily.    Dispense:  30 capsule    Refill:  0  . ACCU-CHEK FASTCLIX LANCETS MISC    Sig: USE FOR FINGERSTICK BLOOD SUGAR TESTING SIX TIMES DAILY    Dispense:  612 each    Refill:  1  . insulin glargine (LANTUS) 100 UNIT/ML injection    Sig: Inject 0.2 mLs (20 Units total) into the skin every morning.    Dispense:  30 mL    Refill:  1    Dx  Code E11.9  . denosumab (PROLIA) 60 MG/ML SOLN injection    Sig: Inject 60 mg into the skin once. Administer in upper arm, thigh, or abdomen    Dispense:  1.8 mL    Refill:  1     Follow-up: Return in about 1 week (around 08/13/2015) for MMSE.  Mechele Claude, M.D.

## 2015-08-13 ENCOUNTER — Other Ambulatory Visit: Payer: Self-pay | Admitting: *Deleted

## 2015-08-14 ENCOUNTER — Encounter (INDEPENDENT_AMBULATORY_CARE_PROVIDER_SITE_OTHER): Payer: Self-pay

## 2015-08-14 ENCOUNTER — Encounter: Payer: Self-pay | Admitting: Family Medicine

## 2015-08-14 ENCOUNTER — Ambulatory Visit (INDEPENDENT_AMBULATORY_CARE_PROVIDER_SITE_OTHER): Payer: Commercial Managed Care - HMO | Admitting: Family Medicine

## 2015-08-14 VITALS — BP 139/66 | HR 75 | Temp 97.1°F | Ht 59.0 in | Wt 117.4 lb

## 2015-08-14 DIAGNOSIS — E11649 Type 2 diabetes mellitus with hypoglycemia without coma: Secondary | ICD-10-CM | POA: Diagnosis not present

## 2015-08-14 DIAGNOSIS — Z794 Long term (current) use of insulin: Secondary | ICD-10-CM | POA: Diagnosis not present

## 2015-08-14 DIAGNOSIS — I10 Essential (primary) hypertension: Secondary | ICD-10-CM | POA: Diagnosis not present

## 2015-08-14 DIAGNOSIS — G3184 Mild cognitive impairment, so stated: Secondary | ICD-10-CM | POA: Diagnosis not present

## 2015-08-14 NOTE — Progress Notes (Signed)
Subjective:  Patient ID: Ellen Woods, female    DOB: 28-Mar-1941  Age: 75 y.o. MRN: [id number]  CC: mini mental status   HPI Ellen Woods presents for memory loss. Forgetful. Going on for a long time - forgetting names.Doesn't balance her check book. Used to keep up with it.   History Ellen Woods has a past medical history of Unspecified essential hypertension; Type II or unspecified type diabetes mellitus without mention of complication, not stated as uncontrolled; Coronary atherosclerosis of native coronary artery; Encounter for long-term (current) use of insulin (HCC); PDA (patent ductus arteriosus); Pericardial calcification; Thyroid disease; Arthritis; Back pain (1/14); Family history of anesthesia complication; and COPD (chronic obstructive pulmonary disease) (HCC).   She has past surgical history that includes Incisional hernia repair; Abdominal hysterectomy; Tubal ligation; and Intramedullary (im) nail intertrochanteric (Left, 11/13/2012).   Her family history includes Stroke in her mother.She reports that she has never smoked. She has never used smokeless tobacco. She reports that she does not drink alcohol or use illicit drugs.    ROS Review of Systems  Objective:  BP 139/66 mmHg  Pulse 75  Temp(Src) 97.1 F (36.2 C) (Oral)  Ht  (1.499 m)  Wt 117 lb 6.4 oz (53.252 kg)  BMI 23.70 kg/m2  SpO2 99%  BP Readings from Last 3 Encounters:  08/14/15 139/66  08/06/15 151/57  07/04/15 174/72    Wt Readings from Last 3 Encounters:  08/14/15 117 lb 6.4 oz (53.252 kg)  08/06/15 120 lb 9.6 oz (54.704 kg)  07/04/15 115 lb (52.164 kg)     Physical Exam  Constitutional: She appears well-developed and well-nourished. No distress.  HENT:  Head: Normocephalic and atraumatic.  Eyes: Pupils are equal, round, and reactive to light.  Neck: Normal range of motion. Neck supple. No thyromegaly present.  Skin: Skin is warm and dry.  Psychiatric: Her behavior is normal.  Thought content normal. Her speech is tangential. Cognition and memory are impaired (cognition limited  concept understanding, but nml on MMSE). She expresses impulsivity. She exhibits abnormal recent memory. She exhibits normal remote memory.  See MMSE below:   mini-mental status exam MMSE - Mini Mental State Exam 08/14/2015  Orientation to time 5  Orientation to Place 5  Registration 3  Attention/ Calculation 5  Recall 2  Language- name 2 objects 2  Language- repeat 1  Language- follow 3 step command 3  Language- read & follow direction 1  Write a sentence 1  Copy design 0  Total score 28     Lab Results  Component Value Date   WBC 5.9 05/29/2015   HGB 12.8 10/13/2014   HCT 38.2 05/29/2015   PLT 264 05/29/2015   GLUCOSE 191* 07/04/2015   CHOL 226* 05/29/2015   TRIG 102 05/29/2015   HDL 74 05/29/2015   LDLCALC 132* 05/29/2015   ALT 25 07/04/2015   AST 32 07/04/2015   NA 140 07/04/2015   K 5.3* 07/04/2015   CL 100 07/04/2015   CREATININE 1.22* 07/04/2015   BUN 29* 07/04/2015   CO2 22 07/04/2015   TSH 3.800 03/14/2014   INR 1.01 11/12/2012   HGBA1C 8.2 05/29/2015    Ct Head Wo Contrast  03/14/2014  CLINICAL DATA:  75 year old diabetic female with confusion. Initial encounter. EXAM: CT HEAD WITHOUT CONTRAST TECHNIQUE: Contiguous axial images were obtained from the base of the skull through the vertex without intravenous contrast. COMPARISON:  08/15/2003 MR.  No comparison CT. FINDINGS: Exam is slightly motion degraded.  No intracranial hemorrhage. Small vessel disease type changes without CT evidence of large acute infarct. Atrophy without hydrocephalus. No intracranial mass lesion noted on this unenhanced exam. Vascular calcifications. No intracranial mass lesion noted on this unenhanced exam. IMPRESSION: Exam is slightly motion degraded. No intracranial hemorrhage. Small vessel disease type changes without CT evidence of large acute infarct. Atrophy without hydrocephalus.  Electronically Signed   By: Bridgett LarssonSteve  Olson M.D.   On: 03/14/2014 13:10       Assessment & Plan:   Ellen Woods was seen today for mini mental status.  Diagnoses and all orders for this visit:  Essential hypertension, benign  Uncontrolled type 2 diabetes mellitus with hypoglycemia without coma, with long-term current use of insulin (HCC)  Mild cognitive impairment with memory loss      I am having Ellen Woods maintain her multivitamin, acetaminophen, aspirin, DSS, furosemide, glucose blood, B-D SINGLE USE SWABS REGULAR, lisinopril, ALPRAZolam, NITROSTAT, ciclopirox, potassium chloride SA, montelukast, mometasone, metoprolol tartrate, canagliflozin, insulin aspart, Calcium Carb-Cholecalciferol, HYDROcodone-acetaminophen, DULoxetine, ACCU-CHEK FASTCLIX LANCETS, insulin glargine, and denosumab.  No orders of the defined types were placed in this encounter.     Follow-up: Return in about 3 months (around 11/13/2015).  Ellen ClaudeWarren Taj Nevins, M.D.

## 2015-08-27 ENCOUNTER — Other Ambulatory Visit: Payer: Self-pay | Admitting: *Deleted

## 2015-08-27 DIAGNOSIS — Z794 Long term (current) use of insulin: Principal | ICD-10-CM

## 2015-08-27 DIAGNOSIS — E11649 Type 2 diabetes mellitus with hypoglycemia without coma: Secondary | ICD-10-CM

## 2015-08-29 ENCOUNTER — Other Ambulatory Visit: Payer: Self-pay | Admitting: Family Medicine

## 2015-08-29 ENCOUNTER — Other Ambulatory Visit: Payer: Self-pay | Admitting: Family

## 2015-08-30 NOTE — Telephone Encounter (Signed)
Last seen 08/14/15  Dr. Darlyn ReadStacks    This is mail order  If approved print for patient

## 2015-08-31 NOTE — Telephone Encounter (Signed)
Please review and advise.

## 2015-08-31 NOTE — Telephone Encounter (Signed)
RX for Xanax called into [facility] Okayed per Dr Darlyn ReadStacks Pt notified

## 2015-09-04 ENCOUNTER — Telehealth: Payer: Self-pay

## 2015-09-04 NOTE — Telephone Encounter (Signed)
x

## 2015-09-06 ENCOUNTER — Encounter: Payer: Self-pay | Admitting: *Deleted

## 2015-09-27 ENCOUNTER — Ambulatory Visit: Payer: Commercial Managed Care - HMO | Admitting: Cardiology

## 2015-10-01 ENCOUNTER — Other Ambulatory Visit: Payer: Self-pay | Admitting: Nurse Practitioner

## 2015-10-04 ENCOUNTER — Other Ambulatory Visit: Payer: Self-pay | Admitting: Pharmacist

## 2015-10-04 DIAGNOSIS — M810 Age-related osteoporosis without current pathological fracture: Secondary | ICD-10-CM

## 2015-10-04 DIAGNOSIS — R7989 Other specified abnormal findings of blood chemistry: Secondary | ICD-10-CM

## 2015-10-08 ENCOUNTER — Other Ambulatory Visit: Payer: Commercial Managed Care - HMO

## 2015-10-08 DIAGNOSIS — Z794 Long term (current) use of insulin: Principal | ICD-10-CM

## 2015-10-08 DIAGNOSIS — E11649 Type 2 diabetes mellitus with hypoglycemia without coma: Secondary | ICD-10-CM

## 2015-10-08 DIAGNOSIS — R7989 Other specified abnormal findings of blood chemistry: Secondary | ICD-10-CM

## 2015-10-08 DIAGNOSIS — M810 Age-related osteoporosis without current pathological fracture: Secondary | ICD-10-CM | POA: Diagnosis not present

## 2015-10-08 DIAGNOSIS — R748 Abnormal levels of other serum enzymes: Secondary | ICD-10-CM | POA: Diagnosis not present

## 2015-10-17 ENCOUNTER — Ambulatory Visit (INDEPENDENT_AMBULATORY_CARE_PROVIDER_SITE_OTHER): Payer: Commercial Managed Care - HMO | Admitting: Family Medicine

## 2015-10-17 ENCOUNTER — Encounter: Payer: Self-pay | Admitting: Family Medicine

## 2015-10-17 VITALS — BP 158/62 | HR 78 | Temp 98.0°F | Ht 59.0 in | Wt 119.8 lb

## 2015-10-17 DIAGNOSIS — G3184 Mild cognitive impairment, so stated: Secondary | ICD-10-CM | POA: Diagnosis not present

## 2015-10-17 DIAGNOSIS — I10 Essential (primary) hypertension: Secondary | ICD-10-CM

## 2015-10-17 DIAGNOSIS — R601 Generalized edema: Secondary | ICD-10-CM | POA: Diagnosis not present

## 2015-10-17 DIAGNOSIS — E11649 Type 2 diabetes mellitus with hypoglycemia without coma: Secondary | ICD-10-CM | POA: Diagnosis not present

## 2015-10-17 DIAGNOSIS — E039 Hypothyroidism, unspecified: Secondary | ICD-10-CM

## 2015-10-17 DIAGNOSIS — Z794 Long term (current) use of insulin: Secondary | ICD-10-CM

## 2015-10-17 MED ORDER — FUROSEMIDE 40 MG PO TABS: 20.0000 mg | ORAL_TABLET | Freq: Every day | ORAL | Status: DC

## 2015-10-17 NOTE — Progress Notes (Signed)
Subjective:  Patient ID: Ellen Woods, female    DOB: 30-Apr-1941  Age: 75 y.o. MRN: [id number]  CC: Hypertension and Edema   HPI SARINA ROBLETO presents for  follow-up of hypertension. Patient has no history of headache chest pain or shortness of breath or recent cough. Patient also denies symptoms of TIA such as numbness weakness lateralizing. Patient checks  blood pressure at home and has not had any elevated readings recently. Patient denies side effects from his medication. States taking it regularly.Patient says that she gets a lot of swelling. She'll notice that when she lays her arm down on something that the imprint of it will be in her arm afterwards. Additionally she has puffiness to the Lasix.  Follow-up of diabetes. Patient does check blood sugar at home. Readings run between 45 and 200. Patient denies symptoms such as polyuria, polydipsia, excessive hunger, nausea No significant hypoglycemic spells noted. Medications as noted below.She does not take the Invokana. She forgets to check her sugar regularly and does not often use the NovoLog. Occasionally her sugar will be too low in the morning so she will not take the Lantus. This occurs when it's less than 100. Even if she is eating a normal diet  through the day.  Patient presents for follow-up on  thyroid. The patient has a history of hypothyroidism for many years. It has been stable recently. Pt. denies any change in  voice, loss of hair, heat or cold intolerance. Energy level has been adequate to good. Patient denies constipation and diarrhea. No myxedema. Medication is as noted below. Verified that pt is taking it daily on an empty stomach. Well tolerated.  History Tennile has a past medical history of Unspecified essential hypertension; Type II or unspecified type diabetes mellitus without mention of complication, not stated as uncontrolled; Coronary atherosclerosis of native coronary artery; Encounter for long-term (current)  use of insulin (HCC); PDA (patent ductus arteriosus); Pericardial calcification; Thyroid disease; Arthritis; Back pain (1/14); Family history of anesthesia complication; and COPD (chronic obstructive pulmonary disease) (HCC).   She has past surgical history that includes Incisional hernia repair; Abdominal hysterectomy; Tubal ligation; and Intramedullary (im) nail intertrochanteric (Left, 11/13/2012).   Her family history includes Stroke in her mother.She reports that she has never smoked. She has never used smokeless tobacco. She reports that she does not drink alcohol or use illicit drugs.  Current Outpatient Prescriptions on File Prior to Visit  Medication Sig Dispense Refill  . ACCU-CHEK FASTCLIX LANCETS MISC USE FOR FINGERSTICK BLOOD SUGAR TESTING SIX TIMES DAILY 612 each 1  . ACCU-CHEK SMARTVIEW test strip USE FOR FINGERSTICK BLOOD SUGAR TESTING SIX TIMES DAILY 500 each 2  . acetaminophen (TYLENOL) 325 MG tablet Take 2 tablets (650 mg total) by mouth every 6 (six) hours as needed. (Patient taking differently: Take 650 mg by mouth every 6 (six) hours as needed for moderate pain. )    . Alcohol Swabs (B-D SINGLE USE SWABS REGULAR) PADS USE FOR FINGERSTICK BLOOD SUGAR TESTING SIX TIMES DAILY 600 each 2  . ALPRAZolam (XANAX) 0.5 MG tablet TAKE 1 TABLET TWICE DAILY 180 tablet 0  . aspirin 81 MG tablet Take 81 mg by mouth daily.    . Calcium Carb-Cholecalciferol (CALCIUM 500 + D3) 500-600 MG-UNIT TABS Take 1 tablet by mouth daily. 60 tablet   . canagliflozin (INVOKANA) 300 MG TABS tablet Take 1 tablet (300 mg total) by mouth daily before breakfast. For diabetes 90 tablet 1  . ciclopirox (PENLAC) 8 %  solution Apply topically at bedtime. Apply over nail and surrounding skin. Apply daily over previous coat. After seven (7) days, may remove with alcohol and continue cycle. 6.6 mL 0  . denosumab (PROLIA) 60 MG/ML SOLN injection Inject 60 mg into the skin once. Administer in upper arm, thigh, or abdomen  1.8 mL 1  . docusate sodium 100 MG CAPS Take 100 mg by mouth 2 (two) times daily. 60 capsule 0  . DULoxetine (CYMBALTA) 30 MG capsule Take 1 capsule (30 mg total) by mouth daily. 30 capsule 0  . HYDROcodone-acetaminophen (NORCO/VICODIN) 5-325 MG tablet Take 1 tablet by mouth 2 (two) times daily as needed for moderate pain. Reported on 08/14/2015    . insulin aspart (NOVOLOG) 100 UNIT/ML injection Inject 2-10 Units into the skin 3 (three) times daily before meals.    . insulin glargine (LANTUS) 100 UNIT/ML injection Inject 0.2 mLs (20 Units total) into the skin every morning. 30 mL 1  . lisinopril (PRINIVIL,ZESTRIL) 20 MG tablet Take 1 tablet (20 mg total) by mouth daily. 90 tablet 3  . metoprolol tartrate (LOPRESSOR) 25 MG tablet TAKE 1 TABLET TWICE DAILY 180 tablet 1  . mometasone (NASONEX) 50 MCG/ACT nasal spray Place 2 sprays into the nose daily. 17 g 12  . montelukast (SINGULAIR) 10 MG tablet Take 1 tablet (10 mg total) by mouth at bedtime. 30 tablet 3  . Multiple Vitamin (MULTIVITAMIN) tablet Take 1 tablet by mouth daily.    Marland Kitchen NITROSTAT 0.4 MG SL tablet Place 1 tablet (0.4 mg total) under the tongue every 5 (five) minutes as needed for chest pain. 20 tablet 0  . potassium chloride SA (K-DUR,KLOR-CON) 20 MEQ tablet TAKE 1 TABLET EVERY DAY 90 tablet 0   No current facility-administered medications on file prior to visit.    ROS Review of Systems  Constitutional: Negative for fever, activity change and appetite change.  HENT: Negative for congestion, rhinorrhea and sore throat.   Eyes: Negative for visual disturbance.  Respiratory: Negative for cough and shortness of breath.   Cardiovascular: Negative for chest pain and palpitations.  Gastrointestinal: Negative for nausea, abdominal pain and diarrhea.  Genitourinary: Negative for dysuria.  Musculoskeletal: Negative for myalgias and arthralgias.    Objective:  BP 158/62 mmHg  Pulse 78  Temp(Src) 98 F (36.7 C) (Oral)  Ht   (1.499 m)  Wt 119 lb 12.8 oz (54.341 kg)  BMI 24.18 kg/m2  SpO2 99%  BP Readings from Last 3 Encounters:  10/17/15 158/62  08/14/15 139/66  08/06/15 151/57    Wt Readings from Last 3 Encounters:  10/17/15 119 lb 12.8 oz (54.341 kg)  08/14/15 117 lb 6.4 oz (53.252 kg)  08/06/15 120 lb 9.6 oz (54.704 kg)     Physical Exam  Constitutional: She is oriented to person, place, and time. She appears well-developed and well-nourished. No distress.  HENT:  Head: Normocephalic and atraumatic.  Eyes: Conjunctivae are normal. Pupils are equal, round, and reactive to light.  Neck: Normal range of motion. Neck supple. No thyromegaly present.  Cardiovascular: Normal rate, regular rhythm and normal heart sounds.   No murmur heard. Pulmonary/Chest: Effort normal and breath sounds normal. No respiratory distress. She has no wheezes. She has no rales.  Abdominal: Soft. Bowel sounds are normal. She exhibits no distension. There is no tenderness.  Musculoskeletal: Normal range of motion. She exhibits edema (2+ RLE at ankle and distal leg).  Lymphadenopathy:    She has no cervical adenopathy.  Neurological: She is  alert and oriented to person, place, and time.  Skin: Skin is warm and dry.  Psychiatric: She has a normal mood and affect. Her behavior is normal. Judgment and thought content normal.    Lab Results  Component Value Date   HGBA1C 8.2 05/29/2015   HGBA1C 8.6 01/24/2015   HGBA1C 8.8 07/21/2014  Bayer A1c done on October 08, 2015 =  8.3    Lab Results  Component Value Date   WBC 5.9 05/29/2015   HGB 12.8 10/13/2014   HCT 38.2 05/29/2015   PLT 264 05/29/2015   GLUCOSE 194* 10/08/2015   CHOL 226* 05/29/2015   TRIG 102 05/29/2015   HDL 74 05/29/2015   LDLCALC 132* 05/29/2015   ALT 25 07/04/2015   AST 32 07/04/2015   NA 140 10/08/2015   K 4.8 10/08/2015   CL 99 10/08/2015   CREATININE 1.01* 10/08/2015   BUN 32* 10/08/2015   CO2 23 10/08/2015   TSH 3.800 03/14/2014   INR 1.01  11/12/2012   HGBA1C 8.2 05/29/2015       Assessment & Plan:   Rivka BarbaraGlenda was seen today for hypertension and edema.  Diagnoses and all orders for this visit:  Uncontrolled type 2 diabetes mellitus with hypoglycemia without coma, with long-term current use of insulin (HCC) -     Ambulatory referral to Endocrinology  Hypothyroidism, unspecified hypothyroidism type  Essential hypertension, benign  Mild cognitive impairment with memory loss  Diabetic hypoglycemia (HCC) -     Ambulatory referral to Endocrinology  Generalized edema  Other orders -     furosemide (LASIX) 40 MG tablet; Take 0.5 tablets (20 mg total) by mouth daily.  I have changed Ms. Kamer's furosemide. I am also having her maintain her multivitamin, acetaminophen, aspirin, DSS, B-D SINGLE USE SWABS REGULAR, lisinopril, NITROSTAT, ciclopirox, montelukast, mometasone, metoprolol tartrate, canagliflozin, insulin aspart, Calcium Carb-Cholecalciferol, HYDROcodone-acetaminophen, DULoxetine, ACCU-CHEK FASTCLIX LANCETS, insulin glargine, denosumab, ALPRAZolam, ACCU-CHEK SMARTVIEW, and potassium chloride SA.  Meds ordered this encounter  Medications  . furosemide (LASIX) 40 MG tablet    Sig: Take 0.5 tablets (20 mg total) by mouth daily.    Dispense:  30 tablet    Refill:  5     Follow-up: Return in about 3 months (around 01/17/2016).  Mechele ClaudeWarren Laquasia Pincus, M.D.

## 2015-10-23 NOTE — Progress Notes (Signed)
Cardiology Office Note   Date:  10/24/2015   ID:  Ellen Woods, DOB [birth year], MRN [id number]  PCP:  Mechele Claude, MD  Cardiologist:   Rollene Rotunda, MD   No chief complaint on file.     History of Present Illness: Ellen Woods is a 75 y.o. female who presents for follow-up of nonobstructive coronary disease an apparent atrial fibrillation. This is her second visit with me.  There is a distant history of atrial fibrillation although wasn't documented that I could find or evident on a Holter monitor in 2013. There is also description of the PDA and pericardial calcification.   There was evidence of a small PDA apparently on the most recent echo and I reviewed these results.   However, this has been managed clinically.   At the last visit she was having some palpitations.  I ordered a Holter which demonstrated some PACs and atrial tach.   She says that she occasionally gets some palpitations but these seem to be better. She does describe some swelling and occasional shortness of breath. She thought she was told to take some extra Lasix. I think she is taking 20 mg twice a day by her report. However, it looks like the prescription was recently sent in for 1/2 of a 40 mg tablet once daily.     Past Medical History  Diagnosis Date  . Unspecified essential hypertension   . Type II or unspecified type diabetes mellitus without mention of complication, not stated as uncontrolled   . Coronary atherosclerosis of native coronary artery     50% LAD stenosis  . Encounter for long-term (current) use of insulin (HCC)   . PDA (patent ductus arteriosus)     Small PDA documented by CT angiography, no pulmonary hypertension  . Pericardial calcification     Ruled out for restrictive pericarditis  . Thyroid disease   . Arthritis   . Back pain 1/14    Tx by orthopedics  . Family history of anesthesia complication     Daughter has nausea  . COPD (chronic obstructive pulmonary disease) [facility])      Past Surgical History  Procedure Laterality Date  . Incisional hernia repair    . Abdominal hysterectomy    . Tubal ligation    . Intramedullary (im) nail intertrochanteric Left 11/13/2012    Procedure: INTRAMEDULLARY (IM) NAIL INTERTROCHANTRIC LEFT HIP;  Surgeon: Velna Ochs, MD;  Location: [facility] OR;  Service: Orthopedics;  Laterality: Left;     Current Outpatient Prescriptions  Medication Sig Dispense Refill  . acetaminophen (TYLENOL) 325 MG tablet Take 2 tablets (650 mg total) by mouth every 6 (six) hours as needed. (Patient taking differently: Take 650 mg by mouth every 6 (six) hours as needed for moderate pain. )    . Alcohol Swabs (B-D SINGLE USE SWABS REGULAR) PADS USE FOR FINGERSTICK BLOOD SUGAR TESTING SIX TIMES DAILY 600 each 2  . ALPRAZolam (XANAX) 0.5 MG tablet TAKE 1 TABLET TWICE DAILY 180 tablet 0  . aspirin 81 MG tablet Take 81 mg by mouth daily.    . Calcium Carb-Cholecalciferol (CALCIUM 500 + D3) 500-600 MG-UNIT TABS Take 1 tablet by mouth daily. 60 tablet   . ciclopirox (PENLAC) 8 % solution Apply topically at bedtime. Apply over nail and surrounding skin. Apply daily over previous coat. After seven (7) days, may remove with alcohol and continue cycle. 6.6 mL 0  . docusate sodium 100 MG CAPS Take 100 mg by mouth 2 (  two) times daily. 60 capsule 0  . furosemide (LASIX) 20 MG tablet Take 20 mg by mouth. Take two tablets by mouth once daily    . HYDROcodone-acetaminophen (NORCO/VICODIN) 5-325 MG tablet Take 1 tablet by mouth 2 (two) times daily as needed for moderate pain. Reported on 08/14/2015    . insulin aspart (NOVOLOG) 100 UNIT/ML injection Inject 2-10 Units into the skin 3 (three) times daily before meals.    . insulin glargine (LANTUS) 100 UNIT/ML injection Inject 0.2 mLs (20 Units total) into the skin every morning. 30 mL 1  . lisinopril (PRINIVIL,ZESTRIL) 20 MG tablet Take 1 tablet (20 mg total) by mouth daily. 90 tablet 3  . metoprolol tartrate (LOPRESSOR) 25  MG tablet TAKE 1 TABLET TWICE DAILY 180 tablet 1  . mometasone (NASONEX) 50 MCG/ACT nasal spray Place 2 sprays into the nose daily. 17 g 12  . montelukast (SINGULAIR) 10 MG tablet Take 1 tablet (10 mg total) by mouth at bedtime. 30 tablet 3  . Multiple Vitamin (MULTIVITAMIN) tablet Take 1 tablet by mouth daily.    Marland Kitchen NITROSTAT 0.4 MG SL tablet Place 1 tablet (0.4 mg total) under the tongue every 5 (five) minutes as needed for chest pain. 20 tablet 0  . potassium chloride SA (K-DUR,KLOR-CON) 20 MEQ tablet TAKE 1 TABLET EVERY DAY 90 tablet 0  . ACCU-CHEK FASTCLIX LANCETS MISC USE FOR FINGERSTICK BLOOD SUGAR TESTING SIX TIMES DAILY 612 each 1  . ACCU-CHEK SMARTVIEW test strip USE FOR FINGERSTICK BLOOD SUGAR TESTING SIX TIMES DAILY 500 each 2  . denosumab (PROLIA) 60 MG/ML SOLN injection Inject 60 mg into the skin once. Administer in upper arm, thigh, or abdomen 1.8 mL 1  . DULoxetine (CYMBALTA) 30 MG capsule Take 1 capsule (30 mg total) by mouth daily. (Patient not taking: Reported on 10/24/2015) 30 capsule 0   No current facility-administered medications for this visit.    Allergies:   Azithromycin and Codeine    ROS:  Please see the history of present illness.   Otherwise, review of systems are positive for back pain.   All other systems are reviewed and negative.    PHYSICAL EXAM: VS:  BP 128/60 mmHg  Pulse 82  Ht 4\' 11"  (1.499 m)  Wt 112 lb (50.803 kg)  BMI 22.61 kg/m2 , BMI Body mass index is 22.61 kg/(m^2). GENERAL:  Well appearing HEENT:  Pupils equal round and reactive, fundi not visualized, oral mucosa unremarkable, edentulous NECK:  No jugular venous distention, waveform within normal limits, carotid upstroke brisk and symmetric, no bruits, no thyromegaly LYMPHATICS:  No cervical, inguinal adenopathy LUNGS:  Clear to auscultation bilaterally BACK:  No CVA tenderness CHEST:  Unremarkable HEART:  PMI not displaced or sustained,S1 and S2 within normal limits, no S3, no S4, no  clicks, no rubs, no murmurs ABD:  Flat, positive bowel sounds normal in frequency in pitch, no bruits, no rebound, no guarding, no midline pulsatile mass, no hepatomegaly, no splenomegaly EXT:  2 plus pulses throughout, no edema, no cyanosis no clubbing   EKG:  EKG is not ordered today.    Recent Labs: 05/29/2015: Platelets 264 07/04/2015: ALT 25 10/08/2015: BUN 32*; Creatinine, Ser 1.01*; Potassium 4.8; Sodium 140    Lipid Panel    Component Value Date/Time   CHOL 226* 05/29/2015 1125   CHOL 238* 07/21/2014 1305   TRIG 102 05/29/2015 1125   TRIG 148 07/21/2014 1305   HDL 74 05/29/2015 1125   HDL 73 07/21/2014 1305   HDL  60 10/25/2012 0952   CHOLHDL 3.1 05/29/2015 1125   CHOLHDL 3.2 10/25/2012 0952   VLDL 18 10/25/2012 0952   LDLCALC 132* 05/29/2015 1125   LDLCALC 113* 10/25/2012 0952      Wt Readings from Last 3 Encounters:  10/24/15 112 lb (50.803 kg)  10/17/15 119 lb 12.8 oz (54.341 kg)  08/14/15 117 lb 6.4 oz (53.252 kg)      Other studies Reviewed: Additional studies/ records that were reviewed today include:  STACKS,WARREN, MD office note. Review of the above records demonstrates:  Please see elsewhere in the note.     ASSESSMENT AND PLAN:  PALPITATIONS:  These seem to be baseline and not particularly problematic. I did describe to her how take an extra when necessary metoprolol if she's having further palpitations.  PDA:   This has been clinically insignificant and no further imaging is indicated at this point.  PERICARDIAL CALCIFICATION:   This has been noted and there is been no evidence of restriction. No further evaluation is warranted.  HTN:   The blood pressure is at target. No change in medications is indicated. We will continue with therapeutic lifestyle changes (TLC).  DYSPNEA:  She is to clarify this when the prescription comes in the mail. If she's having increasing shortness of breath I think she could go to a 40 mg tablet once daily or one half  twice daily but she should let us know what she ends up doing so we can arrange the appropriate follow-up labs  Current medicines are reviewed at length with the patient today.  The patient does not have concerns regarding medicines.  The following changes have been made:  no change  Labs and Tests Ordered  None   Disposition:   FU with me in 12 months.     Signed, Rollene RotundaJames Mirra Basilio, MD  10/24/2015 2:22 PM    [facility]

## 2015-10-24 ENCOUNTER — Encounter: Payer: Self-pay | Admitting: Cardiology

## 2015-10-24 ENCOUNTER — Ambulatory Visit (INDEPENDENT_AMBULATORY_CARE_PROVIDER_SITE_OTHER): Payer: Commercial Managed Care - HMO | Admitting: Cardiology

## 2015-10-24 VITALS — BP 128/60 | HR 82 | Ht 59.0 in | Wt 112.0 lb

## 2015-10-24 DIAGNOSIS — I10 Essential (primary) hypertension: Secondary | ICD-10-CM | POA: Diagnosis not present

## 2015-10-24 NOTE — Patient Instructions (Signed)
Medication Instructions:  Please take Furosemide 40 mg 1/2 tablets every morning as directed by Dr Darlyn ReadStacks.  If swelling or shortness of breath occur you may take 40 mg a day. You may take Metoprolol 25 mg once a day but can take an extra 25 mg a day if you have palpitations. Continue all other medications as listed.  Follow-Up: Follow up in 1 year with Dr. Antoine [place]rein [place].  You will receive a letter in the mail 2 months before you are due.  Please call us when you receive this letter to schedule your follow up appointment.  If you need a refill on your cardiac medications before your next appointment, please call your pharmacy.  Thank you for choosing [facility]!!

## 2015-11-02 ENCOUNTER — Other Ambulatory Visit: Payer: Self-pay | Admitting: Family Medicine

## 2015-11-02 ENCOUNTER — Other Ambulatory Visit: Payer: Self-pay | Admitting: Family

## 2015-11-05 NOTE — Telephone Encounter (Signed)
Last seen 10/17/15  Dr Darlyn ReadStacks  If approved print for pt. For mail order

## 2015-11-05 NOTE — Telephone Encounter (Signed)
Patient aware rx ready to be picked up. Rx placed up front. 

## 2015-11-05 NOTE — Telephone Encounter (Signed)
This is okay to refill it take as directed and if needed

## 2015-11-14 LAB — BMP8+EGFR
BUN / CREAT RATIO: 32 — AB (ref 12–28)
BUN: 32 mg/dL — AB (ref 8–27)
CHLORIDE: 99 mmol/L (ref 96–106)
CO2: 23 mmol/L (ref 18–29)
Calcium: 9.2 mg/dL (ref 8.7–10.3)
Creatinine, Ser: 1.01 mg/dL — ABNORMAL HIGH (ref 0.57–1.00)
GFR calc Af Amer: 63 mL/min/{1.73_m2} (ref >59)
GFR calc non Af Amer: 55 mL/min/{1.73_m2} — ABNORMAL LOW (ref >59)
GLUCOSE: 194 mg/dL — AB (ref 65–99)
Potassium: 4.8 mmol/L (ref 3.5–5.2)
SODIUM: 140 mmol/L (ref 134–144)

## 2015-11-14 LAB — BAYER DCA HB A1C WAIVED: HB A1C: 8.3 % — AB (ref <7.0)

## 2015-11-26 ENCOUNTER — Other Ambulatory Visit: Payer: Self-pay | Admitting: Family

## 2015-11-26 DIAGNOSIS — I10 Essential (primary) hypertension: Secondary | ICD-10-CM

## 2015-11-30 ENCOUNTER — Telehealth: Payer: Self-pay | Admitting: "Endocrinology

## 2015-11-30 ENCOUNTER — Ambulatory Visit (INDEPENDENT_AMBULATORY_CARE_PROVIDER_SITE_OTHER): Payer: Commercial Managed Care - HMO | Admitting: "Endocrinology

## 2015-11-30 ENCOUNTER — Encounter: Payer: Self-pay | Admitting: "Endocrinology

## 2015-11-30 VITALS — BP 149/73 | HR 73 | Ht 59.0 in | Wt 116.0 lb

## 2015-11-30 DIAGNOSIS — E1159 Type 2 diabetes mellitus with other circulatory complications: Secondary | ICD-10-CM | POA: Diagnosis not present

## 2015-11-30 DIAGNOSIS — E1165 Type 2 diabetes mellitus with hyperglycemia: Secondary | ICD-10-CM

## 2015-11-30 DIAGNOSIS — Z794 Long term (current) use of insulin: Secondary | ICD-10-CM | POA: Diagnosis not present

## 2015-11-30 DIAGNOSIS — I10 Essential (primary) hypertension: Secondary | ICD-10-CM | POA: Diagnosis not present

## 2015-11-30 DIAGNOSIS — IMO0002 Reserved for concepts with insufficient information to code with codable children: Secondary | ICD-10-CM

## 2015-11-30 MED ORDER — INSULIN GLARGINE 100 UNIT/ML ~~LOC~~ SOLN: 12.0000 [IU] | Freq: Every morning | SUBCUTANEOUS | 1 refills | Status: DC

## 2015-11-30 NOTE — Telephone Encounter (Signed)
Spoke with pt. She just wanted to go over her Insulin instruction sheet to make sure she is understanding. Pt voices understanding after going over this with her.

## 2015-11-30 NOTE — Telephone Encounter (Signed)
Ellen Woods left a message requesting a call back to discuss her insulin dosing. She has questions after her appointment with Dr.Nida this morning.

## 2015-11-30 NOTE — Progress Notes (Signed)
Subjective:    Patient ID: Ellen Woods, female    DOB: 1940/05/15. Patient is being seen in consultation for management of diabetes requested by  Mechele Claude, MD  Past Medical History:  Diagnosis Date  . Arthritis   . Back pain 1/14   Tx by orthopedics  . COPD (chronic obstructive pulmonary disease) (HCC)   . Coronary atherosclerosis of native coronary artery    50% LAD stenosis  . Encounter for long-term (current) use of insulin (HCC)   . Family history of anesthesia complication    Daughter has nausea  . PDA (patent ductus arteriosus)    Small PDA documented by CT angiography, no pulmonary hypertension  . Pericardial calcification    Ruled out for restrictive pericarditis  . Thyroid disease   . Type II or unspecified type diabetes mellitus without mention of complication, not stated as uncontrolled   . Unspecified essential hypertension    Past Surgical History:  Procedure Laterality Date  . ABDOMINAL HYSTERECTOMY    . INCISIONAL HERNIA REPAIR    . INTRAMEDULLARY (IM) NAIL INTERTROCHANTERIC Left 11/13/2012   Procedure: INTRAMEDULLARY (IM) NAIL INTERTROCHANTRIC LEFT HIP;  Surgeon: Velna Ochs, MD;  Location: [facility] OR;  Service: Orthopedics;  Laterality: Left;  . TUBAL LIGATION     Social History   Social History  . Marital status: Widowed    Spouse name: N/A  . Number of children: 2  . Years of education: N/A   Social History Main Topics  . Smoking status: Never Smoker  . Smokeless tobacco: Never Used  . Alcohol use No  . Drug use: No  . Sexual activity: Not Asked   Other Topics Concern  . None   Social History Narrative  . None   Outpatient Encounter Prescriptions as of 11/30/2015  Medication Sig  . ACCU-CHEK FASTCLIX LANCETS MISC USE FOR FINGERSTICK BLOOD SUGAR TESTING SIX TIMES DAILY  . ACCU-CHEK SMARTVIEW test strip USE FOR FINGERSTICK BLOOD SUGAR TESTING SIX TIMES DAILY  . acetaminophen (TYLENOL) 325 MG tablet Take 2 tablets (650 mg total) by  mouth every 6 (six) hours as needed. (Patient taking differently: Take 650 mg by mouth every 6 (six) hours as needed for moderate pain. )  . Alcohol Swabs (B-D SINGLE USE SWABS REGULAR) PADS USE FOR FINGERSTICK BLOOD SUGAR TESTING SIX TIMES DAILY  . ALPRAZolam (XANAX) 0.5 MG tablet TAKE 1 TABLET TWICE DAILY AS NEEDED  . aspirin 81 MG tablet Take 81 mg by mouth daily.  . Calcium Carb-Cholecalciferol (CALCIUM 500 + D3) 500-600 MG-UNIT TABS Take 1 tablet by mouth daily.  . ciclopirox (PENLAC) 8 % solution Apply topically at bedtime. Apply over nail and surrounding skin. Apply daily over previous coat. After seven (7) days, may remove with alcohol and continue cycle.  . denosumab (PROLIA) 60 MG/ML SOLN injection Inject 60 mg into the skin once. Administer in upper arm, thigh, or abdomen  . docusate sodium 100 MG CAPS Take 100 mg by mouth 2 (two) times daily.  . insulin aspart (NOVOLOG) 100 UNIT/ML injection Inject 4-10 Units into the skin 3 (three) times daily before meals.  . insulin glargine (LANTUS) 100 UNIT/ML injection Inject 0.12 mLs (12 Units total) into the skin every morning.  Marland Kitchen lisinopril (PRINIVIL,ZESTRIL) 20 MG tablet TAKE 1 TABLET EVERY DAY  . metoprolol tartrate (LOPRESSOR) 25 MG tablet TAKE 1 TABLET TWICE DAILY  . mometasone (NASONEX) 50 MCG/ACT nasal spray Place 2 sprays into the nose daily.  . Multiple Vitamin (MULTIVITAMIN) tablet  Take 1 tablet by mouth daily.  Marland Kitchen NITROSTAT 0.4 MG SL tablet Place 1 tablet (0.4 mg total) under the tongue every 5 (five) minutes as needed for chest pain.  . potassium chloride SA (K-DUR,KLOR-CON) 20 MEQ tablet TAKE 1 TABLET EVERY DAY  . [DISCONTINUED] DULoxetine (CYMBALTA) 30 MG capsule Take 1 capsule (30 mg total) by mouth daily. (Patient not taking: Reported on 10/24/2015)  . [DISCONTINUED] furosemide (LASIX) 20 MG tablet Take 20 mg by mouth. Take two tablets by mouth once daily  . [DISCONTINUED] HYDROcodone-acetaminophen (NORCO/VICODIN) 5-325 MG tablet  Take 1 tablet by mouth 2 (two) times daily as needed for moderate pain. Reported on 08/14/2015  . [DISCONTINUED] insulin glargine (LANTUS) 100 UNIT/ML injection Inject 0.2 mLs (20 Units total) into the skin every morning.  . [DISCONTINUED] montelukast (SINGULAIR) 10 MG tablet Take 1 tablet (10 mg total) by mouth at bedtime.   No facility-administered encounter medications on file as of 11/30/2015.    ALLERGIES: Allergies  Allergen Reactions  . Azithromycin Other (See Comments)    Hospital reaction  . Codeine     REACTION: nausea  . Invokana [Canagliflozin]    VACCINATION STATUS: Immunization History  Administered Date(s) Administered  . Influenza,inj,Quad PF,36+ Mos 03/02/2014, 02/23/2015  . Influenza-Unspecified 02/21/2013  . Pneumococcal Conjugate-13 03/02/2014  . Zoster 09/02/2013    Diabetes  She presents for her initial diabetic visit. She has type 2 diabetes mellitus. Onset time: She was diagnosed at approximate age of 35 years. Her disease course has been stable. There are no hypoglycemic associated symptoms. Pertinent negatives for hypoglycemia include no confusion, headaches, pallor or seizures. There are no diabetic associated symptoms. Pertinent negatives for diabetes include no chest pain and no polyphagia. There are no hypoglycemic complications. Symptoms are stable. Diabetic complications include heart disease and PVD. Risk factors for coronary artery disease include diabetes mellitus and hypertension. Current diabetic treatment includes insulin injections (She is taking 16 units of Lantus in the morning and NovoLog 3-10 units with meals.). Her weight is stable. She is following a generally unhealthy diet. When asked about meal planning, she reported none. She has not had a previous visit with a dietitian. She participates in exercise intermittently. Home blood sugar record trend: Her logs show that she has tight fasting blood glucose profile with mild-to-moderate hypoglycemia  into 60s, near target postprandial glycemic profile. Her breakfast blood glucose range is generally 90-110 mg/dl. Her lunch blood glucose range is generally 140-180 mg/dl. Her dinner blood glucose range is generally 140-180 mg/dl. Her overall blood glucose range is 140-180 mg/dl. An ACE inhibitor/angiotensin II receptor blocker is being taken. Eye exam is current.  Hypertension  This is a chronic problem. The current episode started more than 1 year ago. The problem is controlled. Pertinent negatives include no chest pain, headaches, palpitations or shortness of breath. Risk factors for coronary artery disease include diabetes mellitus. Past treatments include beta blockers. Hypertensive end-organ damage includes PVD.       Review of Systems  Constitutional: Negative for unexpected weight change.  HENT: Negative for trouble swallowing and voice change.   Eyes: Negative for visual disturbance.  Respiratory: Negative for cough, shortness of breath and wheezing.   Cardiovascular: Negative for chest pain, palpitations and leg swelling.  Gastrointestinal: Negative for diarrhea, nausea and vomiting.  Endocrine: Negative for cold intolerance, heat intolerance and polyphagia.  Genitourinary: Positive for frequency. Negative for dysuria and flank pain.  Musculoskeletal: Negative for arthralgias and myalgias.  Skin: Negative for color change, pallor,  rash and wound.  Neurological: Negative for seizures and headaches.  Psychiatric/Behavioral: Negative for confusion and suicidal ideas.    Objective:    BP (!) 149/73   Pulse 73   Ht 4\' 11"  (1.499 m)   Wt 116 lb (52.6 kg)   BMI 23.43 kg/m   Wt Readings from Last 3 Encounters:  11/30/15 116 lb (52.6 kg)  10/24/15 112 lb (50.8 kg)  10/17/15 119 lb 12.8 oz (54.3 kg)    Physical Exam  Constitutional: She is oriented to person, place, and time. She appears well-developed.  HENT:  Head: Normocephalic and atraumatic.  Eyes: EOM are normal.  Neck:  Normal range of motion. Neck supple. No tracheal deviation present. No thyromegaly present.  Cardiovascular: Normal rate and regular rhythm.   Pulmonary/Chest: Effort normal and breath sounds normal.  Abdominal: Soft. Bowel sounds are normal. There is no tenderness. There is no guarding.  Musculoskeletal: Normal range of motion. She exhibits no edema.  Neurological: She is alert and oriented to person, place, and time. She has normal reflexes. No cranial nerve deficit. Coordination normal.  Skin: Skin is warm and dry. No rash noted. No erythema. No pallor.  Psychiatric: She has a normal mood and affect. Judgment normal.    CMP     Component Value Date/Time   NA 140 10/08/2015 1002   K 4.8 10/08/2015 1002   CL 99 10/08/2015 1002   CO2 23 10/08/2015 1002   GLUCOSE 194 (H) 10/08/2015 1002   GLUCOSE 134 (H) 03/17/2014 0440   BUN 32 (H) 10/08/2015 1002   CREATININE 1.01 (H) 10/08/2015 1002   CREATININE 0.99 10/25/2012 0952   CALCIUM 9.2 10/08/2015 1002   PROT 6.8 07/04/2015 1000   ALBUMIN 4.3 07/04/2015 1000   AST 32 07/04/2015 1000   ALT 25 07/04/2015 1000   ALKPHOS 76 07/04/2015 1000   BILITOT 0.7 07/04/2015 1000   GFRNONAA 55 (L) 10/08/2015 1002   GFRNONAA 58 (L) 10/25/2012 0952   GFRAA 63 10/08/2015 1002   GFRAA 66 10/25/2012 0952     Diabetic Labs (most recent): Lab Results  Component Value Date   HGBA1C 8.2 05/29/2015   HGBA1C 8.6 01/24/2015   HGBA1C 8.8 07/21/2014     Lipid Panel ( most recent) Lipid Panel     Component Value Date/Time   CHOL 226 (H) 05/29/2015 1125   TRIG 102 05/29/2015 1125   TRIG 148 07/21/2014 1305   HDL 74 05/29/2015 1125   HDL 73 07/21/2014 1305   CHOLHDL 3.1 05/29/2015 1125   CHOLHDL 3.2 10/25/2012 0952   VLDL 18 10/25/2012 0952   LDLCALC 132 (H) 05/29/2015 1125      Assessment & Plan:   1. Uncontrolled type 2 diabetes mellitus with complication, with long-term current use of insulin (HCC)  - Patient has currently uncontrolled  symptomatic type 2 DM since  75 years of age,  with most recent A1c of 8.2 %. Recent labs reviewed. She does not recall the details of her diabetes history however she denies history of DKA.   her diabetes is complicated by coronary artery disease and peripheral arterial disease and patient remains at a high risk for more acute and chronic complications of diabetes which include CAD, CVA, CKD, retinopathy, and neuropathy. These are all discussed in detail with the patient.  - I have counseled the patient on diet management and weight loss, by adopting a carbohydrate restricted/protein rich diet.  - Suggestion is made for patient to avoid simple carbohydrates  from their diet including Cakes , Desserts, Ice Cream,  Soda (  diet and regular) , Sweet Tea , Candies,  Chips, Cookies, Artificial Sweeteners,   and "Sugar-free" Products . This will help patient to have stable blood glucose profile and potentially avoid unintended weight gain.  - I encouraged the patient to switch to  unprocessed or minimally processed complex starch and increased protein intake (animal or plant source), fruits, and vegetables.  - Patient is advised to stick to a routine mealtimes to eat 3 meals  a day and avoid unnecessary snacks ( to snack only to correct hypoglycemia).  - The patient will be scheduled with Norm Salt, RDN, CDE for individualized DM education.  - I have approached patient with the following individualized plan to manage diabetes and patient agrees:   - Since she is having very tight fasting blood glucose profile, I will proceed to lower her basal insulin Lantus to 12 units every morning at 8 AM with breakfast, readjust her prandial insulin NovoLog to 4 units 3 times a day before meals  for pre-meal BG readings of 90-150mg /dl, plus patient specific correction dose for unexpected hyperglycemia above 150mg /dl, associated with strict monitoring of glucose  AC and HS. - Patient is warned not to take insulin  without proper monitoring per orders. -Adjustment parameters are given for hypo and hyperglycemia in writing. -Patient is encouraged to call clinic for blood glucose levels less than 70 or above 300 mg /dl. - She does not tolerate Invokana per her records.  -  She is not a good candidate for Incretin therapy, but will be considered for low-dose metformin if she continues to have normal renal function.   - Patient specific target  A1c;  LDL, HDL, Triglycerides, and  Waist Circumference were discussed in detail.  2) BP/HTN: Controlled. Continue current medications including ACEI/ARB. 3) Lipids/HPL:  Uncontrolled, LDL 132,  she will be considered for  statins. 4)  Weight/Diet: CDE Consult will be initiated , exercise, and detailed carbohydrates information provided.  5) Chronic Care/Health Maintenance:  - She is not on ACEI/ARB nor Statin medications, encouraged to continue to follow up with Ophthalmology, Podiatrist at least yearly or according to recommendations, and advised to   stay away from smoking. I have recommended yearly flu vaccine and pneumonia vaccination at least every 5 years; moderate intensity exercise for up to 150 minutes weekly; and  sleep for at least 7 hours a day.  - 60 minutes of time was spent on the care of this patient , 50% of which was applied for counseling on diabetes complications and their preventions.  - Patient to bring meter and  blood glucose logs during their next visit.   - I advised patient to maintain close follow up with Mechele Claude, MD for primary care needs.  Follow up plan: - Return in about 1 week (around 12/07/2015) for follow up with meter and logs- no labs.  Marquis Lunch, MD Phone: [phone]  Fax: [phone]   11/30/2015, 1:27 PM

## 2015-12-05 ENCOUNTER — Other Ambulatory Visit: Payer: Self-pay | Admitting: Family Medicine

## 2015-12-07 ENCOUNTER — Encounter: Payer: Self-pay | Admitting: "Endocrinology

## 2015-12-07 ENCOUNTER — Ambulatory Visit (INDEPENDENT_AMBULATORY_CARE_PROVIDER_SITE_OTHER): Payer: Commercial Managed Care - HMO | Admitting: "Endocrinology

## 2015-12-07 VITALS — BP 138/70 | HR 60 | Resp 18 | Ht 59.0 in | Wt 115.0 lb

## 2015-12-07 DIAGNOSIS — E1159 Type 2 diabetes mellitus with other circulatory complications: Secondary | ICD-10-CM | POA: Diagnosis not present

## 2015-12-07 DIAGNOSIS — I10 Essential (primary) hypertension: Secondary | ICD-10-CM

## 2015-12-07 DIAGNOSIS — E1165 Type 2 diabetes mellitus with hyperglycemia: Secondary | ICD-10-CM | POA: Diagnosis not present

## 2015-12-07 DIAGNOSIS — IMO0002 Reserved for concepts with insufficient information to code with codable children: Secondary | ICD-10-CM

## 2015-12-07 MED ORDER — INSULIN ASPART 100 UNIT/ML FLEXPEN: 3.0000 [IU] | PEN_INJECTOR | Freq: Three times a day (TID) | SUBCUTANEOUS | 3 refills | Status: DC

## 2015-12-07 NOTE — Progress Notes (Signed)
Subjective:    Patient ID: Ellen Woods, female    DOB: 1940-10-06. Patient is being seen in f/u for management of diabetes requested by  Mechele Claude, MD  Past Medical History:  Diagnosis Date  . Arthritis   . Back pain 1/14   Tx by orthopedics  . COPD (chronic obstructive pulmonary disease) (HCC)   . Coronary atherosclerosis of native coronary artery    50% LAD stenosis  . Encounter for long-term (current) use of insulin (HCC)   . Family history of anesthesia complication    Daughter has nausea  . PDA (patent ductus arteriosus)    Small PDA documented by CT angiography, no pulmonary hypertension  . Pericardial calcification    Ruled out for restrictive pericarditis  . Thyroid disease   . Type II or unspecified type diabetes mellitus without mention of complication, not stated as uncontrolled   . Unspecified essential hypertension    Past Surgical History:  Procedure Laterality Date  . ABDOMINAL HYSTERECTOMY    . INCISIONAL HERNIA REPAIR    . INTRAMEDULLARY (IM) NAIL INTERTROCHANTERIC Left 11/13/2012   Procedure: INTRAMEDULLARY (IM) NAIL INTERTROCHANTRIC LEFT HIP;  Surgeon: Velna Ochs, MD;  Location: [facility] OR;  Service: Orthopedics;  Laterality: Left;  . TUBAL LIGATION     Social History   Social History  . Marital status: Widowed    Spouse name: N/A  . Number of children: 2  . Years of education: N/A   Social History Main Topics  . Smoking status: Never Smoker  . Smokeless tobacco: Never Used  . Alcohol use No  . Drug use: No  . Sexual activity: Not Asked   Other Topics Concern  . None   Social History Narrative  . None   Outpatient Encounter Prescriptions as of 12/07/2015  Medication Sig  . ACCU-CHEK FASTCLIX LANCETS MISC USE FOR FINGERSTICK BLOOD SUGAR TESTING SIX TIMES DAILY  . ACCU-CHEK SMARTVIEW test strip USE FOR FINGERSTICK BLOOD SUGAR TESTING SIX TIMES DAILY  . acetaminophen (TYLENOL) 325 MG tablet Take 2 tablets (650 mg total) by mouth every  6 (six) hours as needed. (Patient taking differently: Take 650 mg by mouth every 6 (six) hours as needed for moderate pain. )  . Alcohol Swabs (B-D SINGLE USE SWABS REGULAR) PADS USE FOR FINGERSTICK BLOOD SUGAR TESTING SIX TIMES DAILY  . ALPRAZolam (XANAX) 0.5 MG tablet TAKE 1 TABLET TWICE DAILY AS NEEDED  . aspirin 81 MG tablet Take 81 mg by mouth daily.  . Calcium Carb-Cholecalciferol (CALCIUM 500 + D3) 500-600 MG-UNIT TABS Take 1 tablet by mouth daily.  . ciclopirox (PENLAC) 8 % solution Apply topically at bedtime. Apply over nail and surrounding skin. Apply daily over previous coat. After seven (7) days, may remove with alcohol and continue cycle.  . denosumab (PROLIA) 60 MG/ML SOLN injection Inject 60 mg into the skin once. Administer in upper arm, thigh, or abdomen  . docusate sodium 100 MG CAPS Take 100 mg by mouth 2 (two) times daily.  . Insulin Glargine (LANTUS SOLOSTAR [place]) Inject 14 Units into the skin daily with breakfast.  . lisinopril (PRINIVIL,ZESTRIL) 20 MG tablet TAKE 1 TABLET EVERY DAY  . metoprolol tartrate (LOPRESSOR) 25 MG tablet TAKE 1 TABLET TWICE DAILY  . mometasone (NASONEX) 50 MCG/ACT nasal spray Place 2 sprays into the nose daily.  . Multiple Vitamin (MULTIVITAMIN) tablet Take 1 tablet by mouth daily.  Marland Kitchen NITROSTAT 0.4 MG SL tablet Place 1 tablet (0.4 mg total) under the tongue every  5 (five) minutes as needed for chest pain.  . potassium chloride SA (K-DUR,KLOR-CON) 20 MEQ tablet TAKE 1 TABLET EVERY DAY  . [DISCONTINUED] insulin aspart (NOVOLOG) 100 UNIT/ML injection Inject 3-5 Units into the skin 3 (three) times daily before meals.  . [DISCONTINUED] insulin glargine (LANTUS) 100 UNIT/ML injection Inject 0.12 mLs (12 Units total) into the skin every morning.  . insulin aspart (NOVOLOG) 100 UNIT/ML FlexPen Inject 3-5 Units into the skin 3 (three) times daily with meals.   No facility-administered encounter medications on file as of 12/07/2015.    ALLERGIES: Allergies   Allergen Reactions  . Azithromycin Other (See Comments)    Hospital reaction  . Codeine     REACTION: nausea  . Invokana [Canagliflozin]    VACCINATION STATUS: Immunization History  Administered Date(s) Administered  . Influenza,inj,Quad PF,36+ Mos 03/02/2014, 02/23/2015  . Influenza-Unspecified 02/21/2013  . Pneumococcal Conjugate-13 03/02/2014  . Zoster 09/02/2013    Diabetes  She presents for her follow-up diabetic visit. She has type 2 diabetes mellitus. Onset time: She was diagnosed at approximate age of 35 years. Her disease course has been worsening. There are no hypoglycemic associated symptoms. Pertinent negatives for hypoglycemia include no confusion, headaches, pallor or seizures. There are no diabetic associated symptoms. Pertinent negatives for diabetes include no chest pain and no polyphagia. There are no hypoglycemic complications. Symptoms are worsening. Diabetic complications include heart disease and PVD. Risk factors for coronary artery disease include diabetes mellitus and hypertension. Current diabetic treatment includes insulin injections (She is taking 16 units of Lantus in the morning and NovoLog 3-10 units with meals.). Her weight is stable. She is following a generally unhealthy diet. When asked about meal planning, she reported none. She has not had a previous visit with a dietitian. She participates in exercise intermittently. Her home blood glucose trend is fluctuating dramatically (Her logs show that she has less frequent hypoglycemia than before, however she made insulin dosing errors. She is accompanied by her daughter this morning was offering to help.). Her breakfast blood glucose range is generally 90-110 mg/dl. Her lunch blood glucose range is generally 140-180 mg/dl. Her dinner blood glucose range is generally 140-180 mg/dl. Her overall blood glucose range is 140-180 mg/dl. An ACE inhibitor/angiotensin II receptor blocker is being taken. Eye exam is current.   Hypertension  This is a chronic problem. The current episode started more than 1 year ago. The problem is controlled. Pertinent negatives include no chest pain, headaches, palpitations or shortness of breath. Risk factors for coronary artery disease include diabetes mellitus. Past treatments include beta blockers. Hypertensive end-organ damage includes PVD.       Review of Systems  Constitutional: Negative for unexpected weight change.  HENT: Negative for trouble swallowing and voice change.   Eyes: Negative for visual disturbance.  Respiratory: Negative for cough, shortness of breath and wheezing.   Cardiovascular: Negative for chest pain, palpitations and leg swelling.  Gastrointestinal: Negative for diarrhea, nausea and vomiting.  Endocrine: Negative for cold intolerance, heat intolerance and polyphagia.  Genitourinary: Positive for frequency. Negative for dysuria and flank pain.  Musculoskeletal: Negative for arthralgias and myalgias.  Skin: Negative for color change, pallor, rash and wound.  Neurological: Negative for seizures and headaches.  Psychiatric/Behavioral: Negative for confusion and suicidal ideas.    Objective:    BP 138/70   Pulse 60   Resp 18   Ht 4\' 11"  (1.499 m)   Wt 115 lb (52.2 kg)   SpO2 100%   BMI 23.23  kg/m   Wt Readings from Last 3 Encounters:  12/07/15 115 lb (52.2 kg)  11/30/15 116 lb (52.6 kg)  10/24/15 112 lb (50.8 kg)    Physical Exam  Constitutional: She is oriented to person, place, and time. She appears well-developed.  HENT:  Head: Normocephalic and atraumatic.  Eyes: EOM are normal.  Neck: Normal range of motion. Neck supple. No tracheal deviation present. No thyromegaly present.  Cardiovascular: Normal rate and regular rhythm.   Pulmonary/Chest: Effort normal and breath sounds normal.  Abdominal: Soft. Bowel sounds are normal. There is no tenderness. There is no guarding.  Musculoskeletal: Normal range of motion. She exhibits no  edema.  Neurological: She is alert and oriented to person, place, and time. She has normal reflexes. No cranial nerve deficit. Coordination normal.  Skin: Skin is warm and dry. No rash noted. No erythema. No pallor.  Psychiatric: She has a normal mood and affect. Judgment normal.    CMP     Component Value Date/Time   NA 140 10/08/2015 1002   K 4.8 10/08/2015 1002   CL 99 10/08/2015 1002   CO2 23 10/08/2015 1002   GLUCOSE 194 (H) 10/08/2015 1002   GLUCOSE 134 (H) 03/17/2014 0440   BUN 32 (H) 10/08/2015 1002   CREATININE 1.01 (H) 10/08/2015 1002   CREATININE 0.99 10/25/2012 0952   CALCIUM 9.2 10/08/2015 1002   PROT 6.8 07/04/2015 1000   ALBUMIN 4.3 07/04/2015 1000   AST 32 07/04/2015 1000   ALT 25 07/04/2015 1000   ALKPHOS 76 07/04/2015 1000   BILITOT 0.7 07/04/2015 1000   GFRNONAA 55 (L) 10/08/2015 1002   GFRNONAA 58 (L) 10/25/2012 0952   GFRAA 63 10/08/2015 1002   GFRAA 66 10/25/2012 0952     Diabetic Labs (most recent): Lab Results  Component Value Date   HGBA1C 8.2 05/29/2015   HGBA1C 8.6 01/24/2015   HGBA1C 8.8 07/21/2014     Lipid Panel ( most recent) Lipid Panel     Component Value Date/Time   CHOL 226 (H) 05/29/2015 1125   TRIG 102 05/29/2015 1125   TRIG 148 07/21/2014 1305   HDL 74 05/29/2015 1125   HDL 73 07/21/2014 1305   CHOLHDL 3.1 05/29/2015 1125   CHOLHDL 3.2 10/25/2012 0952   VLDL 18 10/25/2012 0952   LDLCALC 132 (H) 05/29/2015 1125      Assessment & Plan:   1. Uncontrolled type 2 diabetes mellitus with complication, with long-term current use of insulin (HCC)  - Patient has currently uncontrolled symptomatic type 2 DM since  75 years of age,  with most recent A1c of 8.2 %. Recent labs reviewed. She does not recall the details of her diabetes history however she denies history of DKA.   her diabetes is complicated by coronary artery disease and peripheral arterial disease and patient remains at a high risk for more acute and chronic  complications of diabetes which include CAD, CVA, CKD, retinopathy, and neuropathy. These are all discussed in detail with the patient.  - I have counseled the patient on diet management and weight loss, by adopting a carbohydrate restricted/protein rich diet.  - Suggestion is made for patient to avoid simple carbohydrates   from their diet including Cakes , Desserts, Ice Cream,  Soda (  diet and regular) , Sweet Tea , Candies,  Chips, Cookies, Artificial Sweeteners,   and "Sugar-free" Products . This will help patient to have stable blood glucose profile and potentially avoid unintended weight gain.  - I  encouraged the patient to switch to  unprocessed or minimally processed complex starch and increased protein intake (animal or plant source), fruits, and vegetables.  - Patient is advised to stick to a routine mealtimes to eat 3 meals  a day and avoid unnecessary snacks ( to snack only to correct hypoglycemia).  - The patient will be scheduled with Norm Salt, RDN, CDE for individualized DM education.  - I have approached patient with the following individualized plan to manage diabetes and patient agrees:   - The primary objective in the care of this patient is to avoid hypoglycemia. - She has struggled to coordinate her meals with her insulin timing.  - After discussing these important factor with her and her daughter,  I increased her basal insulin Lantus slightly to 14 units  every morning at 8 AM with breakfast, readjust her prandial insulin NovoLog to 3 units 3 times a day before meals  for pre-meal BG readings of 90-150mg /dl, plus patient specific correction dose for unexpected hyperglycemia above 150mg /dl, associated with strict monitoring of glucose  AC and HS. - Patient is warned not to take insulin without proper monitoring per orders. -Adjustment parameters are given for hypo and hyperglycemia in writing. -Patient is encouraged to call clinic for blood glucose levels less than 70  or above 300 mg /dl. - Mr. Turk is struggling to coordinate her meals with her insulin. I shared my concern with her daughter in the room. Her daughter is offering to help and I discussed the care detail with her.  -  She is not a good candidate for SGLT2 I,  Incretin therapy, but will be considered for low-dose metformin if she continues to have normal renal function.   - Patient specific target  A1c;  LDL, HDL, Triglycerides, and  Waist Circumference were discussed in detail.  2) BP/HTN: Controlled. Continue current medications including ACEI/ARB. 3) Lipids/HPL:  Uncontrolled, LDL 132,  she will be considered for  statins. 4)  Weight/Diet: CDE Consult will be initiated , exercise, and detailed carbohydrates information provided.  5) Chronic Care/Health Maintenance:  - She is not on ACEI/ARB nor Statin medications, encouraged to continue to follow up with Ophthalmology, Podiatrist at least yearly or according to recommendations, and advised to   stay away from smoking. I have recommended yearly flu vaccine and pneumonia vaccination at least every 5 years; moderate intensity exercise for up to 150 minutes weekly; and  sleep for at least 7 hours a day.  - 25 minutes of time was spent on the care of this patient , 50% of which was applied for counseling on diabetes complications and their preventions.  - Patient to bring meter and  blood glucose logs during their next visit.   - I advised patient to maintain close follow up with Mechele Claude, MD for primary care needs.  Follow up plan: - Return in about 6 weeks (around 01/18/2016) for follow up with pre-visit labs, meter, and logs.  Marquis Lunch, MD Phone: [phone]  Fax: [phone]   12/07/2015, 11:12 AM

## 2016-01-18 ENCOUNTER — Other Ambulatory Visit: Payer: Commercial Managed Care - HMO

## 2016-01-18 DIAGNOSIS — E1165 Type 2 diabetes mellitus with hyperglycemia: Secondary | ICD-10-CM | POA: Diagnosis not present

## 2016-01-18 DIAGNOSIS — E1159 Type 2 diabetes mellitus with other circulatory complications: Secondary | ICD-10-CM | POA: Diagnosis not present

## 2016-01-19 LAB — TSH: TSH: 9.59 u[IU]/mL — ABNORMAL HIGH (ref 0.450–4.500)

## 2016-01-19 LAB — CMP14+EGFR
ALT: 37 IU/L — AB (ref 0–32)
AST: 31 IU/L (ref 0–40)
Albumin/Globulin Ratio: 1.6 (ref 1.2–2.2)
Albumin: 4.1 g/dL (ref 3.5–4.8)
Alkaline Phosphatase: 75 IU/L (ref 39–117)
BUN/Creatinine Ratio: 26 (ref 12–28)
BUN: 25 mg/dL (ref 8–27)
Bilirubin Total: 0.8 mg/dL (ref 0.0–1.2)
CALCIUM: 9.3 mg/dL (ref 8.7–10.3)
CO2: 26 mmol/L (ref 18–29)
CREATININE: 0.96 mg/dL (ref 0.57–1.00)
Chloride: 98 mmol/L (ref 96–106)
GFR, EST AFRICAN AMERICAN: 67 mL/min/{1.73_m2} (ref >59)
GFR, EST NON AFRICAN AMERICAN: 58 mL/min/{1.73_m2} — AB (ref >59)
GLOBULIN, TOTAL: 2.5 g/dL (ref 1.5–4.5)
Glucose: 111 mg/dL — ABNORMAL HIGH (ref 65–99)
Potassium: 4.5 mmol/L (ref 3.5–5.2)
SODIUM: 141 mmol/L (ref 134–144)
TOTAL PROTEIN: 6.6 g/dL (ref 6.0–8.5)

## 2016-01-19 LAB — HEMOGLOBIN A1C
Est. average glucose Bld gHb Est-mCnc: 148 mg/dL
HEMOGLOBIN A1C: 6.8 % — AB (ref 4.8–5.6)

## 2016-01-19 LAB — T4, FREE: Free T4: 1.11 ng/dL (ref 0.82–1.77)

## 2016-01-25 ENCOUNTER — Encounter: Payer: Self-pay | Admitting: "Endocrinology

## 2016-01-25 ENCOUNTER — Ambulatory Visit (INDEPENDENT_AMBULATORY_CARE_PROVIDER_SITE_OTHER): Payer: Commercial Managed Care - HMO | Admitting: "Endocrinology

## 2016-01-25 VITALS — BP 163/94 | HR 58 | Ht 59.0 in | Wt 117.0 lb

## 2016-01-25 DIAGNOSIS — IMO0002 Reserved for concepts with insufficient information to code with codable children: Secondary | ICD-10-CM

## 2016-01-25 DIAGNOSIS — E1165 Type 2 diabetes mellitus with hyperglycemia: Secondary | ICD-10-CM

## 2016-01-25 DIAGNOSIS — I10 Essential (primary) hypertension: Secondary | ICD-10-CM | POA: Diagnosis not present

## 2016-01-25 DIAGNOSIS — E038 Other specified hypothyroidism: Secondary | ICD-10-CM | POA: Diagnosis not present

## 2016-01-25 DIAGNOSIS — E1159 Type 2 diabetes mellitus with other circulatory complications: Secondary | ICD-10-CM | POA: Diagnosis not present

## 2016-01-25 MED ORDER — LEVOTHYROXINE SODIUM 50 MCG PO TABS: 50.0000 ug | ORAL_TABLET | Freq: Every day | ORAL | 0 refills | Status: DC

## 2016-01-25 MED ORDER — LEVOTHYROXINE SODIUM 50 MCG PO TABS: 50.0000 ug | ORAL_TABLET | Freq: Every day | ORAL | 3 refills | Status: DC

## 2016-01-25 MED ORDER — INSULIN ASPART 100 UNIT/ML FLEXPEN: 2.0000 [IU] | PEN_INJECTOR | Freq: Three times a day (TID) | SUBCUTANEOUS | 3 refills | Status: DC

## 2016-01-25 NOTE — Progress Notes (Signed)
Subjective:    Patient ID: Ellen Woods, female    DOB: [birth year]. Patient is being seen in f/u for management of diabetes requested by  Ellen Claude, MD  Past Medical History:  Diagnosis Date  . Arthritis   . Back pain 1/14   Tx by orthopedics  . COPD (chronic obstructive pulmonary disease) (HCC)   . Coronary atherosclerosis of native coronary artery    50% LAD stenosis  . Encounter for long-term (current) use of insulin (HCC)   . Family history of anesthesia complication    Daughter has nausea  . PDA (patent ductus arteriosus)    Small PDA documented by CT angiography, no pulmonary hypertension  . Pericardial calcification    Ruled out for restrictive pericarditis  . Thyroid disease   . Type II or unspecified type diabetes mellitus without mention of complication, not stated as uncontrolled   . Unspecified essential hypertension    Past Surgical History:  Procedure Laterality Date  . ABDOMINAL HYSTERECTOMY    . INCISIONAL HERNIA REPAIR    . INTRAMEDULLARY (IM) NAIL INTERTROCHANTERIC Left 11/13/2012   Procedure: INTRAMEDULLARY (IM) NAIL INTERTROCHANTRIC LEFT HIP;  Surgeon: Velna Ochs, MD;  Location: [facility] OR;  Service: Orthopedics;  Laterality: Left;  . TUBAL LIGATION     Social History   Social History  . Marital status: Widowed    Spouse name: N/A  . Number of children: 2  . Years of education: N/A   Social History Main Topics  . Smoking status: Never Smoker  . Smokeless tobacco: Never Used  . Alcohol use No  . Drug use: No  . Sexual activity: Not Asked   Other Topics Concern  . None   Social History Narrative  . None   Outpatient Encounter Prescriptions as of 01/25/2016  Medication Sig  . ACCU-CHEK FASTCLIX LANCETS MISC USE FOR FINGERSTICK BLOOD SUGAR TESTING SIX TIMES DAILY  . ACCU-CHEK SMARTVIEW test strip USE FOR FINGERSTICK BLOOD SUGAR TESTING SIX TIMES DAILY  . acetaminophen (TYLENOL) 325 MG tablet Take 2 tablets (650 mg total) by mouth  every 6 (six) hours as needed. (Patient taking differently: Take 650 mg by mouth every 6 (six) hours as needed for moderate pain. )  . Alcohol Swabs (B-D SINGLE USE SWABS REGULAR) PADS USE FOR FINGERSTICK BLOOD SUGAR TESTING SIX TIMES DAILY  . ALPRAZolam (XANAX) 0.5 MG tablet TAKE 1 TABLET TWICE DAILY AS NEEDED  . aspirin 81 MG tablet Take 81 mg by mouth daily.  . Calcium Carb-Cholecalciferol (CALCIUM 500 + D3) 500-600 MG-UNIT TABS Take 1 tablet by mouth daily.  . ciclopirox (PENLAC) 8 % solution Apply topically at bedtime. Apply over nail and surrounding skin. Apply daily over previous coat. After seven (7) days, may remove with alcohol and continue cycle.  . denosumab (PROLIA) 60 MG/ML SOLN injection Inject 60 mg into the skin once. Administer in upper arm, thigh, or abdomen  . docusate sodium 100 MG CAPS Take 100 mg by mouth 2 (two) times daily.  . insulin aspart (NOVOLOG) 100 UNIT/ML FlexPen Inject 2-4 Units into the skin 3 (three) times daily with meals.  . Insulin Glargine (LANTUS SOLOSTAR [place]) Inject 14 Units into the skin daily with breakfast.  . levothyroxine (SYNTHROID, LEVOTHROID) 50 MCG tablet Take 1 tablet (50 mcg total) by mouth daily before breakfast.  . lisinopril (PRINIVIL,ZESTRIL) 20 MG tablet TAKE 1 TABLET EVERY DAY  . metoprolol tartrate (LOPRESSOR) 25 MG tablet TAKE 1 TABLET TWICE DAILY  . mometasone (NASONEX) 50  MCG/ACT nasal spray Place 2 sprays into the nose daily.  . Multiple Vitamin (MULTIVITAMIN) tablet Take 1 tablet by mouth daily.  Marland Kitchen NITROSTAT 0.4 MG SL tablet Place 1 tablet (0.4 mg total) under the tongue every 5 (five) minutes as needed for chest pain.  . potassium chloride SA (K-DUR,KLOR-CON) 20 MEQ tablet TAKE 1 TABLET EVERY DAY  . [DISCONTINUED] insulin aspart (NOVOLOG) 100 UNIT/ML FlexPen Inject 3-5 Units into the skin 3 (three) times daily with meals.  . [DISCONTINUED] levothyroxine (SYNTHROID, LEVOTHROID) 50 MCG tablet Take 1 tablet (50 mcg total) by mouth daily  before breakfast.   No facility-administered encounter medications on file as of 01/25/2016.    ALLERGIES: Allergies  Allergen Reactions  . Azithromycin Other (See Comments)    Hospital reaction  . Codeine     REACTION: nausea  . Invokana [Canagliflozin]    VACCINATION STATUS: Immunization History  Administered Date(s) Administered  . Influenza,inj,Quad PF,36+ Mos 03/02/2014, 02/23/2015  . Influenza-Unspecified 02/21/2013  . Pneumococcal Conjugate-13 03/02/2014  . Zoster 09/02/2013    Diabetes  She presents for her follow-up diabetic visit. She has type 2 diabetes mellitus. Onset time: She was diagnosed at approximate age of 35 years. Her disease course has been improving. There are no hypoglycemic associated symptoms. Pertinent negatives for hypoglycemia include no confusion, headaches, pallor or seizures. There are no diabetic associated symptoms. Pertinent negatives for diabetes include no chest pain and no polyphagia. There are no hypoglycemic complications. Symptoms are improving. Diabetic complications include heart disease and PVD. Risk factors for coronary artery disease include diabetes mellitus and hypertension. Current diabetic treatment includes insulin injections (She is taking 16 units of Lantus in the morning and NovoLog 3-10 units with meals.). Her weight is stable. She is following a generally unhealthy diet. When asked about meal planning, she reported none. She has not had a previous visit with a dietitian. She participates in exercise intermittently. Her home blood glucose trend is fluctuating minimally (Her logs show that she has less frequent hypoglycemia than before, however she made insulin dosing errors. She is accompanied by her daughter this morning was offering to help.). Her breakfast blood glucose range is generally 130-140 mg/dl. Her Woods blood glucose range is generally 130-140 mg/dl. Her dinner blood glucose range is generally 130-140 mg/dl. Her overall blood  glucose range is 130-140 mg/dl. An ACE inhibitor/angiotensin II receptor blocker is being taken. Eye exam is current.  Hypertension  This is a chronic problem. The current episode started more than 1 year ago. The problem is controlled. Pertinent negatives include no chest pain, headaches, palpitations or shortness of breath. Risk factors for coronary artery disease include diabetes mellitus. Past treatments include beta blockers. Hypertensive end-organ damage includes PVD.       Review of Systems  Constitutional: Negative for unexpected weight change.  HENT: Negative for trouble swallowing and voice change.   Eyes: Negative for visual disturbance.  Respiratory: Negative for cough, shortness of breath and wheezing.   Cardiovascular: Negative for chest pain, palpitations and leg swelling.  Gastrointestinal: Negative for diarrhea, nausea and vomiting.  Endocrine: Negative for cold intolerance, heat intolerance and polyphagia.  Genitourinary: Positive for frequency. Negative for dysuria and flank pain.  Musculoskeletal: Negative for arthralgias and myalgias.  Skin: Negative for color change, pallor, rash and wound.  Neurological: Negative for seizures and headaches.  Psychiatric/Behavioral: Negative for confusion and suicidal ideas.    Objective:    BP (!) 163/94   Pulse (!) 58   Ht 4\' 11"  (  1.499 m)   Wt 117 lb (53.1 kg)   BMI 23.63 kg/m   Wt Readings from Last 3 Encounters:  01/25/16 117 lb (53.1 kg)  12/07/15 115 lb (52.2 kg)  11/30/15 116 lb (52.6 kg)    Physical Exam  Constitutional: She is oriented to person, place, and time. She appears well-developed.  HENT:  Head: Normocephalic and atraumatic.  Eyes: EOM are normal.  Neck: Normal range of motion. Neck supple. No tracheal deviation present. No thyromegaly present.  Cardiovascular: Normal rate and regular rhythm.   Pulmonary/Chest: Effort normal and breath sounds normal.  Abdominal: Soft. Bowel sounds are normal. There  is no tenderness. There is no guarding.  Musculoskeletal: Normal range of motion. She exhibits no edema.  Neurological: She is alert and oriented to person, place, and time. She has normal reflexes. No cranial nerve deficit. Coordination normal.  Skin: Skin is warm and dry. No rash noted. No erythema. No pallor.  Psychiatric: She has a normal mood and affect. Judgment normal.    CMP     Component Value Date/Time   NA 141 01/18/2016 0824   K 4.5 01/18/2016 0824   CL 98 01/18/2016 0824   CO2 26 01/18/2016 0824   GLUCOSE 111 (H) 01/18/2016 0824   GLUCOSE 134 (H) 03/17/2014 0440   BUN 25 01/18/2016 0824   CREATININE 0.96 01/18/2016 0824   CREATININE 0.99 10/25/2012 0952   CALCIUM 9.3 01/18/2016 0824   PROT 6.6 01/18/2016 0824   ALBUMIN 4.1 01/18/2016 0824   AST 31 01/18/2016 0824   ALT 37 (H) 01/18/2016 0824   ALKPHOS 75 01/18/2016 0824   BILITOT 0.8 01/18/2016 0824   GFRNONAA 58 (L) 01/18/2016 0824   GFRNONAA 58 (L) 10/25/2012 0952   GFRAA 67 01/18/2016 0824   GFRAA 66 10/25/2012 0952     Diabetic Labs (most recent): Lab Results  Component Value Date   HGBA1C 6.8 (H) 01/18/2016   HGBA1C 8.2 05/29/2015   HGBA1C 8.6 01/24/2015     Lipid Panel ( most recent) Lipid Panel     Component Value Date/Time   CHOL 226 (H) 05/29/2015 1125   TRIG 102 05/29/2015 1125   TRIG 148 07/21/2014 1305   HDL 74 05/29/2015 1125   HDL 73 07/21/2014 1305   CHOLHDL 3.1 05/29/2015 1125   CHOLHDL 3.2 10/25/2012 0952   VLDL 18 10/25/2012 0952   LDLCALC 132 (H) 05/29/2015 1125      Assessment & Plan:   1. Uncontrolled type 2 diabetes mellitus with complication, with long-term current use of insulin (HCC)  - Patient has currently uncontrolled symptomatic type 2 DM since  75 years of age,  with most recent A1c of  6.8% improving from 8.2 %. Recent labs reviewed. She does not recall the details of her diabetes history however she denies history of DKA.   her diabetes is complicated by  coronary artery disease and peripheral arterial disease and patient remains at a high risk for more acute and chronic complications of diabetes which include CAD, CVA, CKD, retinopathy, and neuropathy. These are all discussed in detail with the patient.  - I have counseled the patient on diet management and weight loss, by adopting a carbohydrate restricted/protein rich diet.  - Suggestion is made for patient to avoid simple carbohydrates   from their diet including Cakes , Desserts, Ice Cream,  Soda (  diet and regular) , Sweet Tea , Candies,  Chips, Cookies, Artificial Sweeteners,   and "Sugar-free" Products . This will  help patient to have stable blood glucose profile and potentially avoid unintended weight gain.  - I encouraged the patient to switch to  unprocessed or minimally processed complex starch and increased protein intake (animal or plant source), fruits, and vegetables.  - Patient is advised to stick to a routine mealtimes to eat 3 meals  a day and avoid unnecessary snacks ( to snack only to correct hypoglycemia).  - The patient will be scheduled with Ellen Woods, Ellen Woods, Ellen Woods for individualized DM education.  - I have approached patient with the following individualized plan to manage diabetes and patient agrees:   - The primary objective in the care of this patient is to avoid hypoglycemia. - She has struggled to coordinate her meals with her insulin timing.  - I will continue her basal insulin Lantus  14 units  every morning at 8 AM with breakfast, readjust her prandial insulin NovoLog to 2-4 units 3 times a day before meals  for pre-meal BG readings of 90-150mg /dl, plus patient specific correction dose for unexpected hyperglycemia above 150mg /dl, associated with strict monitoring of glucose  AC and HS. - Patient is warned not to take insulin without proper monitoring per orders. -Adjustment parameters are given for hypo and hyperglycemia in writing. -Patient is encouraged to call  clinic for blood glucose levels less than 70 or above 300 mg /dl. -She reports that she is getting the help from her daughter was not accompanying her today.  -  She is not a good candidate for SGLT2 I,  Incretin therapy, but will be considered for low-dose metformin if she continues to have normal renal function.   - Patient specific target  A1c;  LDL, HDL, Triglycerides, and  Waist Circumference were discussed in detail.  2) BP/HTN: Controlled. Continue current medications including ACEI/ARB. 3) Lipids/HPL:  Uncontrolled, LDL 132,  she will be considered for  statins. 4)  Weight/Diet: Ellen Woods Consult will be initiated , exercise, and detailed carbohydrates information provided.  5) hypothyroidism: - Her labs are consistent with hypothyroidism. On further interview patient reports that she was treated for hypothyroidism in the remote past however her medications were dropped for unclear reasons. She would benefit from partial supplement with thyroid hormone. -I will initiate levothyroxine 50 g by mouth every morning.  - We discussed about correct intake of levothyroxine, at fasting, with water, separated by at least 30 minutes from breakfast, and separated by more than 4 hours from calcium, iron, multivitamins, acid reflux medications (PPIs). -Patient is made aware of the fact that thyroid hormone replacement is needed for life, dose to be adjusted by periodic monitoring of thyroid function tests.  6) Chronic Care/Health Maintenance:  - She is not on ACEI/ARB nor Statin medications, encouraged to continue to follow up with Ophthalmology, Podiatrist at least yearly or according to recommendations, and advised to   stay away from smoking. I have recommended yearly flu vaccine and pneumonia vaccination at least every 5 years; moderate intensity exercise for up to 150 minutes weekly; and  sleep for at least 7 hours a day.  - 25 minutes of time was spent on the care of this patient , 50% of which was  applied for counseling on diabetes complications and their preventions.  - Patient to bring meter and  blood glucose logs during their next visit.   - I advised patient to maintain close follow up with Ellen Claude, MD for primary care needs.  Follow up plan: - No Follow-up on file.  Ellen Lunch, MD  Phone: [phone]  Fax: [phone]   01/25/2016, 12:04 PM

## 2016-01-27 ENCOUNTER — Other Ambulatory Visit: Payer: Self-pay | Admitting: Pharmacist

## 2016-01-27 MED ORDER — DENOSUMAB 60 MG/ML ~~LOC~~ SOLN: 60.0000 mg | Freq: Once | SUBCUTANEOUS | 1 refills | Status: AC

## 2016-01-28 ENCOUNTER — Other Ambulatory Visit: Payer: Self-pay | Admitting: Family Medicine

## 2016-01-28 DIAGNOSIS — I10 Essential (primary) hypertension: Secondary | ICD-10-CM

## 2016-02-07 ENCOUNTER — Encounter: Payer: Self-pay | Admitting: Family Medicine

## 2016-02-07 ENCOUNTER — Ambulatory Visit (INDEPENDENT_AMBULATORY_CARE_PROVIDER_SITE_OTHER): Payer: Commercial Managed Care - HMO | Admitting: Family Medicine

## 2016-02-07 ENCOUNTER — Ambulatory Visit (INDEPENDENT_AMBULATORY_CARE_PROVIDER_SITE_OTHER): Payer: Commercial Managed Care - HMO

## 2016-02-07 VITALS — BP 118/57 | HR 66 | Temp 97.9°F | Ht 59.0 in | Wt 116.1 lb

## 2016-02-07 DIAGNOSIS — M545 Low back pain, unspecified: Secondary | ICD-10-CM

## 2016-02-07 MED ORDER — CYCLOBENZAPRINE HCL 10 MG PO TABS: 10.0000 mg | ORAL_TABLET | Freq: Three times a day (TID) | ORAL | 1 refills | Status: DC | PRN

## 2016-02-07 MED ORDER — DICLOFENAC SODIUM 75 MG PO TBEC: 75.0000 mg | DELAYED_RELEASE_TABLET | Freq: Two times a day (BID) | ORAL | 2 refills | Status: DC

## 2016-02-07 NOTE — Progress Notes (Signed)
Subjective:  Patient ID: Ellen Woods, female    DOB: 05-12-1940  Age: 75 y.o. MRN: [id number]  CC: Back Pain (pt here today c/o back pain, she fell about a month ago in her bedroom and landed on her back, not sure if that is the cause.)   HPI Ellen Woods presents for Severe bilateral lower back pain. Present for month. Worse when she moves from seated to standing position. It's inhibiting her ability to ambulate. She is using a cane. The patient states that the pain is a sharp ache. There is some left hip pain but no clear radiation pattern from the lumbar region to the left side.   History Ellen Woods has a past medical history of Arthritis; Back pain (1/14); COPD (chronic obstructive pulmonary disease) ([place]); Coronary atherosclerosis of native coronary artery; Encounter for long-term (current) use of insulin ([place]); Family history of anesthesia complication; PDA (patent ductus arteriosus); Pericardial calcification; Thyroid disease; Type II or unspecified type diabetes mellitus without mention of complication, not stated as uncontrolled; and Unspecified essential hypertension.   She has a past surgical history that includes Incisional hernia repair; Abdominal hysterectomy; Tubal ligation; and Intramedullary (im) nail intertrochanteric (Left, 11/13/2012).   Her family history includes Stroke in her mother.She reports that she has never smoked. She has never used smokeless tobacco. She reports that she does not drink alcohol or use drugs.    ROS Review of Systems  Constitutional: Negative for activity change, appetite change and fever.  HENT: Negative for congestion, rhinorrhea and sore throat.   Eyes: Negative for visual disturbance.  Respiratory: Negative for cough and shortness of breath.   Cardiovascular: Negative for chest pain and palpitations.  Gastrointestinal: Negative for abdominal pain, diarrhea and nausea.  Genitourinary: Negative for dysuria.  Musculoskeletal: Negative  for arthralgias and myalgias.    Objective:  BP (!) 118/57   Pulse 66   Temp 97.9 F (36.6 C) (Oral)   Ht 4' 11" (1.499 m)   Wt 116 lb 2 oz (52.7 kg)   BMI 23.45 kg/m   BP Readings from Last 3 Encounters:  02/07/16 (!) 118/57  01/25/16 (!) 163/94  12/07/15 138/70    Wt Readings from Last 3 Encounters:  02/07/16 116 lb 2 oz (52.7 kg)  01/25/16 117 lb (53.1 kg)  12/07/15 115 lb (52.2 kg)     Physical Exam  Constitutional: She appears well-developed and well-nourished.  HENT:  Head: Normocephalic.  Cardiovascular: Normal rate and regular rhythm.   No murmur heard. Pulmonary/Chest: Effort normal and breath sounds normal.  Musculoskeletal: Normal range of motion. She exhibits tenderness (over the L4-L5 spinous processes and spinalis musculature).  Skin: Skin is warm and dry.     Lab Results  Component Value Date   WBC 5.9 05/29/2015   HGB 12.8 10/13/2014   HCT 38.2 05/29/2015   PLT 264 05/29/2015   GLUCOSE 111 (H) 01/18/2016   CHOL 226 (H) 05/29/2015   TRIG 102 05/29/2015   HDL 74 05/29/2015   LDLCALC 132 (H) 05/29/2015   ALT 37 (H) 01/18/2016   AST 31 01/18/2016   NA 141 01/18/2016   K 4.5 01/18/2016   CL 98 01/18/2016   CREATININE 0.96 01/18/2016   BUN 25 01/18/2016   CO2 26 01/18/2016   TSH 9.590 (H) 01/18/2016   INR 1.01 11/12/2012   HGBA1C 6.8 (H) 01/18/2016   MICROALBUR 20 07/21/2014    Assessment & Plan:   Ellen Woods was seen today for back pain.  Diagnoses  and all orders for this visit:  Acute midline low back pain without sciatica -     DG Lumbar Spine 2-3 Views; Future -     Ambulatory referral to Physical Therapy -     Ambulatory referral to Physical Therapy  Other orders -     diclofenac (VOLTAREN) 75 MG EC tablet; Take 1 tablet (75 mg total) by mouth 2 (two) times daily. For muscle and  Joint pain -     cyclobenzaprine (FLEXERIL) 10 MG tablet; Take 1 tablet (10 mg total) by mouth 3 (three) times daily as needed for muscle  spasms.    I am having Ellen Woods start on diclofenac and cyclobenzaprine. I am also having her maintain her multivitamin, acetaminophen, aspirin, DSS, B-D SINGLE USE SWABS REGULAR, NITROSTAT, ciclopirox, mometasone, Calcium Carb-Cholecalciferol, ACCU-CHEK FASTCLIX LANCETS, ACCU-CHEK SMARTVIEW, ALPRAZolam, metoprolol tartrate, potassium chloride SA, Insulin Glargine (LANTUS SOLOSTAR [place]), insulin aspart, levothyroxine, lisinopril, PROLIA, furosemide, TRUE METRIX AIR GLUCOSE METER, and TRUE METRIX LEVEL 1.  Meds ordered this encounter  Medications  . PROLIA 60 MG/ML SOLN injection  . furosemide (LASIX) 40 MG tablet  . Blood Glucose Monitoring Suppl (TRUE METRIX AIR GLUCOSE METER) w/Device KIT  . Blood Glucose Calibration (TRUE METRIX LEVEL 1) Low SOLN  . diclofenac (VOLTAREN) 75 MG EC tablet    Sig: Take 1 tablet (75 mg total) by mouth 2 (two) times daily. For muscle and  Joint pain    Dispense:  60 tablet    Refill:  2  . cyclobenzaprine (FLEXERIL) 10 MG tablet    Sig: Take 1 tablet (10 mg total) by mouth 3 (three) times daily as needed for muscle spasms.    Dispense:  90 tablet    Refill:  1     Follow-up: Return if symptoms worsen or fail to improve.  Ellen Woods, M.D.

## 2016-02-12 ENCOUNTER — Ambulatory Visit: Payer: Commercial Managed Care - HMO | Attending: Family Medicine | Admitting: Physical Therapy

## 2016-02-12 ENCOUNTER — Encounter: Payer: Self-pay | Admitting: Physical Therapy

## 2016-02-12 DIAGNOSIS — M25552 Pain in left hip: Secondary | ICD-10-CM | POA: Insufficient documentation

## 2016-02-12 DIAGNOSIS — M545 Low back pain, unspecified: Secondary | ICD-10-CM

## 2016-02-12 DIAGNOSIS — M6281 Muscle weakness (generalized): Secondary | ICD-10-CM

## 2016-02-12 NOTE — Patient Instructions (Signed)
Bridge   Lie back, legs bent. Inhale, pressing hips up. Keeping ribs in, lengthen lower back. Exhale, rolling down along spine from top. Repeat 10-30 times. Do __2__ sessions per day.   Stretching: Piriformis (Supine)  Pull right knee toward opposite shoulder. Hold _30___ seconds. Relax. Repeat _3___ times per set. Do ____ sets per session. Do 2-3____ sessions per day.   Solon PalmJulie Kelven Flater, PT 02/12/16 12:02 PM [facility] [address] [place], [place], [address] Phone: [phone]   Fax:  [phone]

## 2016-02-12 NOTE — Therapy (Signed)
[facility] [address] [place], [place], [address] Phone: [phone]   Fax:  [phone]  Physical Therapy Evaluation  Patient Details  Name: Ellen Woods MRN: [id number] Date of Birth: 04-17-1941 Referring Provider: Mechele Claude MD  Encounter Date: 02/12/2016      PT End of Session - 02/12/16 1120    Visit Number 1   Number of Visits 12   Date for PT Re-Evaluation 03/25/16   PT Start Time 1120   PT Stop Time 1215   PT Time Calculation (min) 55 min   Activity Tolerance Patient tolerated treatment well   Behavior During Therapy [facility] for tasks assessed/performed      Past Medical History:  Diagnosis Date  . Arthritis   . Back pain 1/14   Tx by orthopedics  . COPD (chronic obstructive pulmonary disease) (HCC)   . Coronary atherosclerosis of native coronary artery    50% LAD stenosis  . Encounter for long-term (current) use of insulin (HCC)   . Family history of anesthesia complication    Daughter has nausea  . PDA (patent ductus arteriosus)    Small PDA documented by CT angiography, no pulmonary hypertension  . Pericardial calcification    Ruled out for restrictive pericarditis  . Thyroid disease   . Type II or unspecified type diabetes mellitus without mention of complication, not stated as uncontrolled   . Unspecified essential hypertension     Past Surgical History:  Procedure Laterality Date  . ABDOMINAL HYSTERECTOMY    . INCISIONAL HERNIA REPAIR    . INTRAMEDULLARY (IM) NAIL INTERTROCHANTERIC Left 11/13/2012   Procedure: INTRAMEDULLARY (IM) NAIL INTERTROCHANTRIC LEFT HIP;  Surgeon: Velna Ochs, MD;  Location: [facility] OR;  Service: Orthopedics;  Laterality: Left;  . TUBAL LIGATION      There were no vitals filed for this visit.       Subjective Assessment - 02/12/16 1123    Subjective Patient states she woke up one morning and her back was hurting. She had a fall about one month previous. The next day was  worse and she couldn't sit on commode. It has been better since muscle relaxor but still hurts when she tries to get out of bed.    Pertinent History Osteoporosis, DM, COPD, arythmia, L hip surgery (nail) 2014.   How long can you sit comfortably? 20 min   Patient Stated Goals to get rid of pain   Currently in Pain? Yes   Pain Score 9    Pain Location Back   Pain Orientation Lower;Mid   Pain Descriptors / Indicators Sharp   Pain Type Acute pain   Pain Onset 1 to 4 weeks ago   Pain Frequency Constant   Aggravating Factors  sitting, bed mobility   Pain Relieving Factors standing   Effect of Pain on Daily Activities limited            [facility] PT Assessment - 02/12/16 0001      Assessment   Medical Diagnosis midline acute LBP without sciatica   Referring Provider Mechele Claude MD   Onset Date/Surgical Date 02/08/16   Next MD Visit not scheduled     Precautions   Precautions Fall   Precaution Comments Osteoporosis     Balance Screen   Has the patient fallen in the past 6 months Yes   How many times? 1  standing in room and just fell   Has the patient had a decrease in activity level because of a  fear of falling?  No   Is the patient reluctant to leave their home because of a fear of falling?  No     Home [employer] information [employer] manager residence   Living Arrangements Children   Home Layout One level     Prior Function   Level of Independence Independent with basic ADLs     Observation/Other Assessments   Focus on Therapeutic Outcomes (FOTO)  70% limited     Posture/Postural Control   Posture Comments depressed R shoulder, tight R QL; decreased lumbar lordosis; mild forward head     ROM / Strength   AROM / PROM / Strength Strength;AROM     AROM   AROM Assessment Site Lumbar   Lumbar Flexion WNL   Lumbar Extension WNL   Lumbar - Right Side Bend 25%   Lumbar - Left Side Bend 75%     Strength   Strength Assessment Site Hip;Knee   Right/Left Hip  Right;Left   Right Hip Flexion 4+/5   Right Hip ADduction 4+/5   Left Hip Flexion 4-/5   Left Hip ABduction 4-/5   Right/Left Knee Right;Left   Right Knee Flexion 4/5   Right Knee Extension 5/5   Left Knee Flexion 4/5   Left Knee Extension 5/5                   [facility] Adult PT Treatment/Exercise - 02/12/16 0001      Modalities   Modalities Electrical Stimulation;Moist Heat     Moist Heat Therapy   Number Minutes Moist Heat 15 Minutes   Moist Heat Location Lumbar Spine     Electrical Stimulation   Electrical Stimulation Location Lumbar spine 80-150 Hz to tolerance x 15 min   Electrical Stimulation Goals Pain                PT Education - 02/12/16 1204    Education provided Yes   Education Details HEP   Person(s) Educated Patient   Methods Explanation;Demonstration;Handout   Comprehension Verbalized understanding;Returned demonstration          PT Short Term Goals - 02/12/16 1243      PT SHORT TERM GOAL #1   Title I with HEP   Time 3   Period Weeks   Status New           PT Long Term Goals - 02/12/16 1244      PT LONG TERM GOAL #1   Title Patient to be able to sit for 45 min with pain 2/10 or less   Time 6   Period Weeks   Status New     PT LONG TERM GOAL #2   Title Decreased pain with ADLS to 3-4/10.   Time 6   Period Weeks   Status New     PT LONG TERM GOAL #3   Title Patient able to sleep without waking from LBP   Time 6   Period Weeks   Status New     PT LONG TERM GOAL #4   Title Improved B hip strength to 4+/5 or better to help normalize gait.   Time 6   Period Weeks   Status New               Plan - 02/12/16 1205    Clinical Impression Statement Patient presents with acute onset of LBP. Xrays show congenital partial fusion across the L3-4 disc space. Patient has a h/o left hip fracture and also  c/o pain in left hip. She has marked tightness of L hip rotators and bil HF.  Pain is limiting ADLS including  sitting tolerance and bed mobility. She also amb with a SPC which was adjusted to proper height.   Rehab Potential Excellent   PT Frequency 2x / week   PT Duration 6 weeks   PT Treatment/Interventions ADLs/Self Care Home Management;Electrical Stimulation;Moist Heat;Ultrasound;Therapeutic exercise;Neuromuscular re-education;Patient/family education;Passive range of motion;Manual techniques;Dry needling   PT Next Visit Plan US or DN to R QL, lumbar paraspinals; STW to same; flexibility, core stab; Modalities for pain.   PT Home Exercise Plan bridge, piriformis stretch   Consulted and Agree with Plan of Care Patient      Patient will benefit from skilled therapeutic intervention in order to improve the following deficits and impairments:  Abnormal gait, Decreased range of motion, Pain, Decreased strength, Postural dysfunction  Visit Diagnosis: Acute midline low back pain without sciatica - Plan: PT plan of care cert/re-cert  Pain in left hip - Plan: PT plan of care cert/re-cert  Muscle weakness (generalized) - Plan: PT plan of care cert/re-cert      G-Codes - 02/12/16 1252    Functional Assessment Tool Used FOTO 70% limited   Functional Limitation Mobility: Walking and moving around   Mobility: Walking and Moving Around Current Status [phone]([id number]) At least 60 percent but less than 80 percent impaired, limited or restricted   Mobility: Walking and Moving Around Goal Status [phone]([id number]) At least 20 percent but less than 40 percent impaired, limited or restricted       Problem List Patient Active Problem List   Diagnosis Date Noted  . Pathological fracture of left femur due to age-related osteoporosis with routine healing 06/06/2015  . Diabetic hypoglycemia (HCC) 05/29/2015  . Hypokalemia 07/21/2014  . Other dietary vitamin B12 deficiency anemia 07/21/2014  . Other seasonal allergic rhinitis 07/21/2014  . Hypothyroidism 07/21/2014  . Transaminitis   . Diabetes type 2, uncontrolled (HCC)   .  Metabolic encephalopathy   . Constipation 03/15/2014  . SIRS (systemic inflammatory response syndrome) (HCC) 03/14/2014  . Chronic diastolic heart failure (HCC) 03/14/2014  . Osteoporosis 10/25/2012  . PAF (paroxysmal atrial fibrillation) (HCC) 03/15/2012  . Carotid bruit 03/15/2012  . Pericardial calcification   . PDA (patent ductus arteriosus)   . Coronary atherosclerosis of native coronary artery   . Hyperlipidemia with target LDL less than 100 03/18/2010  . Essential hypertension, benign 03/18/2010    Solon PalmJulie Kadarious Dikes PT 02/12/2016, 12:58 PM  [facility] [address] [place], [place], [address] Phone: [phone]   Fax:  [phone]  Name: Melody HaverGlenda J Chaudhuri MRN: [id number] Date of Birth: 10/24/1940

## 2016-02-19 ENCOUNTER — Ambulatory Visit: Payer: Commercial Managed Care - HMO | Admitting: Physical Therapy

## 2016-02-19 DIAGNOSIS — M545 Low back pain, unspecified: Secondary | ICD-10-CM

## 2016-02-19 DIAGNOSIS — M6281 Muscle weakness (generalized): Secondary | ICD-10-CM | POA: Diagnosis not present

## 2016-02-19 DIAGNOSIS — M25552 Pain in left hip: Secondary | ICD-10-CM

## 2016-02-19 NOTE — Patient Instructions (Addendum)
Trigger Point Dry Needling  . What is Trigger Point Dry Needling (DN)? o DN is a physical therapy technique used to treat muscle pain and dysfunction. Specifically, DN helps deactivate muscle trigger points (muscle knots).  o A thin filiform needle is used to penetrate the skin and stimulate the underlying trigger point. The goal is for a local twitch response (LTR) to occur and for the trigger point to relax. No medication of any kind is injected during the procedure.   . What Does Trigger Point Dry Needling Feel Like?  o The procedure feels different for each individual patient. Some patients report that they do not actually feel the needle enter the skin and overall the process is not painful. Very mild bleeding may occur. However, many patients feel a deep cramping in the muscle in which the needle was inserted. This is the local twitch response.   Marland Kitchen. How Will I feel after the treatment? o Soreness is normal, and the onset of soreness may not occur for a few hours. Typically this soreness does not last longer than two days.  o Bruising is uncommon, however; ice can be used to decrease any possible bruising.  o In rare cases feeling tired or nauseous after the treatment is normal. In addition, your symptoms may get worse before they get better, this period will typically not last longer than 24 hours.   . What Can I do After My Treatment? o Increase your hydration by drinking more water for the next 24 hours. o You may place ice or heat on the areas treated that have become sore, however, do not use heat on inflamed or bruised areas. Heat often brings more relief post needling. o You can continue your regular activities, but vigorous activity is not recommended initially after the treatment for 24 hours. o DN is best combined with other physical therapy such as strengthening, stretching, and other therapies.    Precautions:  In some cases, dry needling is done over the lung field. While rare,  there is a risk of pneumothorax (punctured lung). Because of this, if you ever experience shortness of breath on exertion, difficulty taking a deep breath, chest pain or a dry cough following dry needling, you should report to an emergency room and tell them that you have been dry needled over the thorax.  Solon PalmJulie Derricka Mertz, PT 02/19/16 12:32 PM [facility] [address] [place], [place], [address] Phone: [phone]   Fax:  [phone]

## 2016-02-19 NOTE — Therapy (Signed)
[facility] [place], [place], [address] Phone: [phone]   Fax:  [phone]  Physical Therapy Treatment  Patient Details  Name: Ellen Woods MRN: [id number] Date of Birth: Oct 16, 1940 Referring Provider: Claretta Fraise MD  Encounter Date: 02/19/2016      PT End of Session - 02/19/16 1120    Visit Number 2   Number of Visits 12   Date for PT Re-Evaluation 03/25/16   PT Start Time 1120   PT Stop Time 1219   PT Time Calculation (min) 59 min   Activity Tolerance Patient tolerated treatment well   Behavior During Therapy [facility] for tasks assessed/performed      Past Medical History:  Diagnosis Date  . Arthritis   . Back pain 1/14   Tx by orthopedics  . COPD (chronic obstructive pulmonary disease) ([place])   . Coronary atherosclerosis of native coronary artery    50% LAD stenosis  . Encounter for long-term (current) use of insulin ([place])   . Family history of anesthesia complication    Daughter has nausea  . PDA (patent ductus arteriosus)    Small PDA documented by CT angiography, no pulmonary hypertension  . Pericardial calcification    Ruled out for restrictive pericarditis  . Thyroid disease   . Type II or unspecified type diabetes mellitus without mention of complication, not stated as uncontrolled   . Unspecified essential hypertension     Past Surgical History:  Procedure Laterality Date  . ABDOMINAL HYSTERECTOMY    . INCISIONAL HERNIA REPAIR    . INTRAMEDULLARY (IM) NAIL INTERTROCHANTERIC Left 11/13/2012   Procedure: INTRAMEDULLARY (IM) NAIL INTERTROCHANTRIC LEFT HIP;  Surgeon: Hessie Dibble, MD;  Location: [facility];  Service: Orthopedics;  Laterality: Left;  . TUBAL LIGATION      There were no vitals filed for this visit.      Subjective Assessment - 02/19/16 1121    Subjective Patient reports she has not been very compliant with HEP.   Pertinent History Osteoporosis, DM, COPD, arythmia, L hip  surgery (nail) 2014.   How long can you sit comfortably? 20 min   Patient Stated Goals to get rid of pain   Currently in Pain? Yes   Pain Score 4    Pain Location Back   Pain Orientation Mid;Lower   Pain Descriptors / Indicators Sharp   Pain Type Acute pain                         [facility] Adult PT Treatment/Exercise - 02/19/16 0001      Exercises   Exercises Knee/Hip     Knee/Hip Exercises: Stretches   Piriformis Stretch Both;1 rep;30 seconds     Knee/Hip Exercises: Supine   Bridges Strengthening;Both;10 reps   Bridges Limitations pt favors L side;    Single Leg Bridge Strengthening;Left;2 sets;5 reps     Modalities   Modalities Electrical Stimulation;Moist Heat;Ultrasound     Moist Heat Therapy   Number Minutes Moist Heat 15 Minutes   Moist Heat Location Lumbar Spine     Electrical Stimulation   Electrical Stimulation Location Lumbar spine 80-150 Hz to tolerance x 15 min   Electrical Stimulation Goals Pain     Ultrasound   Ultrasound Location lumbar   Ultrasound Parameters 1.5 w/cm2 1.0 mhz cont x 10 min   Ultrasound Goals Pain     Manual Therapy   Manual Therapy Soft tissue mobilization;Myofascial release   Soft tissue mobilization  lumbar paraspinals and L glut med   Myofascial Release lumbar spine                PT Education - 02/19/16 1232    Education provided Yes   Education Details DN education   Person(s) Educated Patient   Methods Explanation;Handout   Comprehension Verbalized understanding          PT Short Term Goals - 02/12/16 1243      PT SHORT TERM GOAL #1   Title I with HEP   Time 3   Period Weeks   Status New           PT Long Term Goals - 02/12/16 1244      PT LONG TERM GOAL #1   Title Patient to be able to sit for 45 min with pain 2/10 or less   Time 6   Period Weeks   Status New     PT LONG TERM GOAL #2   Title Decreased pain with ADLS to 6-0/73.   Time 6   Period Weeks   Status New     PT  LONG TERM GOAL #3   Title Patient able to sleep without waking from LBP   Time 6   Period Weeks   Status New     PT LONG TERM GOAL #4   Title Improved B hip strength to 4+/5 or better to help normalize gait.   Time 6   Period Weeks   Status New               Plan - 02/19/16 1225    Clinical Impression Statement Patient had decreased pain today and states she has been trying not to lift/bend as much. She tolerated treatment well but did experience cramping in B HS with bridging. She was educated re: dry needing today and given h/o.  No goals met as only second visit.   Rehab Potential Excellent   PT Frequency 2x / week   PT Duration 6 weeks   PT Treatment/Interventions ADLs/Self Care Home Management;Electrical Stimulation;Moist Heat;Ultrasound;Therapeutic exercise;Neuromuscular re-education;Patient/family education;Passive range of motion;Manual techniques;Dry needling   PT Next Visit Plan Therex/core; flexibility L hip rotators; Modalities for pain. STW prn.   PT Home Exercise Plan bridge, piriformis stretch   Consulted and Agree with Plan of Care Patient      Patient will benefit from skilled therapeutic intervention in order to improve the following deficits and impairments:  Abnormal gait, Decreased range of motion, Pain, Decreased strength, Postural dysfunction  Visit Diagnosis: Acute midline low back pain without sciatica  Pain in left hip     Problem List Patient Active Problem List   Diagnosis Date Noted  . Pathological fracture of left femur due to age-related osteoporosis with routine healing 06/06/2015  . Diabetic hypoglycemia ([place]) 05/29/2015  . Hypokalemia 07/21/2014  . Other dietary vitamin B12 deficiency anemia 07/21/2014  . Other seasonal allergic rhinitis 07/21/2014  . Hypothyroidism 07/21/2014  . Transaminitis   . Diabetes type 2, uncontrolled ([place])   . Metabolic encephalopathy   . Constipation 03/15/2014  . SIRS (systemic inflammatory response  syndrome) ([place]) 03/14/2014  . Chronic diastolic heart failure ([place]) 03/14/2014  . Osteoporosis 10/25/2012  . PAF (paroxysmal atrial fibrillation) ([place]) 03/15/2012  . Carotid bruit 03/15/2012  . Pericardial calcification   . PDA (patent ductus arteriosus)   . Coronary atherosclerosis of native coronary artery   . Hyperlipidemia with target LDL less than 100 03/18/2010  . Essential hypertension, benign 03/18/2010  Madelyn Flavors PT 02/19/2016, 12:33 PM  [facility] [place], [place], [address] Phone: [phone]   Fax:  [phone]  Name: Ellen Woods MRN: [id number] Date of Birth: 1941-01-18

## 2016-02-21 ENCOUNTER — Ambulatory Visit (INDEPENDENT_AMBULATORY_CARE_PROVIDER_SITE_OTHER): Payer: Commercial Managed Care - HMO | Admitting: Pharmacist

## 2016-02-21 ENCOUNTER — Encounter: Payer: Self-pay | Admitting: Pharmacist

## 2016-02-21 VITALS — BP 120/68 | HR 70 | Ht 59.0 in | Wt 117.0 lb

## 2016-02-21 DIAGNOSIS — Z0000 Encounter for general adult medical examination without abnormal findings: Secondary | ICD-10-CM | POA: Diagnosis not present

## 2016-02-21 DIAGNOSIS — Z794 Long term (current) use of insulin: Secondary | ICD-10-CM

## 2016-02-21 DIAGNOSIS — IMO0002 Reserved for concepts with insufficient information to code with codable children: Secondary | ICD-10-CM

## 2016-02-21 DIAGNOSIS — E1165 Type 2 diabetes mellitus with hyperglycemia: Secondary | ICD-10-CM

## 2016-02-21 DIAGNOSIS — Z23 Encounter for immunization: Secondary | ICD-10-CM

## 2016-02-21 DIAGNOSIS — E1159 Type 2 diabetes mellitus with other circulatory complications: Secondary | ICD-10-CM

## 2016-02-21 MED ORDER — POLYETHYLENE GLYCOL 3350 17 GM/SCOOP PO POWD: ORAL | 1 refills | Status: DC

## 2016-02-21 NOTE — Patient Instructions (Addendum)
Ellen Woods , Thank you for taking time to come for your Medicare Wellness Visit. I appreciate your ongoing commitment to your health goals. Please review the following plan we discussed and let me know if I can assist you in the future.   These are the goals we discussed:  Continue with carbohydrate counting / high protein diet recommended by Dr Dorris Fetch.   Recommend exercise daily - goal is to get 150 minutes each week.   Need to make appointment for diabetic eye exam (last exam was 02/22/2016) - [phone] ([facility])  Recommend try Miralax in place of Metamucil - mix 1 capful with at least 4 ounces of beverage of your choosing and drink once each day.      This is a list of the screening recommended for you and due dates:  Health Maintenance  Topic Date Due  . Tetanus Vaccine  10/03/2013 - will check coverage in 2018  . Pneumonia vaccines (2 of 2 - PPSV23) 03/03/2015 - will consider getting in 2018  . Complete foot exam   07/21/2015  . Flu Shot  08/02/2016 - given in office today  . Eye exam for diabetics  02/22/2016  . Hemoglobin A1C  07/17/2016  . DEXA scan (bone density measurement)  02/20/2017  . Colon Cancer Screening  08/25/2017  . Shingles Vaccine  Completed  *Topic was postponed. The date shown is not the original due date.   Health Maintenance, Female Adopting a healthy lifestyle and getting preventive care can go a long way to promote health and wellness. Talk with your health care provider about what schedule of regular examinations is right for you. This is a good chance for you to check in with your provider about disease prevention and staying healthy. In between checkups, there are plenty of things you can do on your own. Experts have done a lot of research about which lifestyle changes and preventive measures are most likely to keep you healthy. Ask your health care provider for more information. WEIGHT AND DIET  Eat a healthy diet  Be sure to include plenty  of vegetables, fruits, low-fat dairy products, and lean protein.  Do not eat a lot of foods high in solid fats, added sugars, or salt.  Get regular exercise. This is one of the most important things you can do for your health.  Most adults should exercise for at least 150 minutes each week. The exercise should increase your heart rate and make you sweat (moderate-intensity exercise).  Most adults should also do strengthening exercises at least twice a week. This is in addition to the moderate-intensity exercise.  Maintain a healthy weight  Body mass index (BMI) is a measurement that can be used to identify possible weight problems. It estimates body fat based on height and weight. Your health care provider can help determine your BMI and help you achieve or maintain a healthy weight.  For females 29 years of age and older:   A BMI below 18.5 is considered underweight.  A BMI of 18.5 to 24.9 is normal.  A BMI of 25 to 29.9 is considered overweight.  A BMI of 30 and above is considered obese.  Watch levels of cholesterol and blood lipids  You should start having your blood tested for lipids and cholesterol at 75 years of age, then have this test every 5 years.  You may need to have your cholesterol levels checked more often if:  Your lipid or cholesterol levels are high.  You are older than 75 years of age.  You are at high risk for heart disease.  CANCER SCREENING   Lung Cancer  Lung cancer screening is recommended for adults 4-3 years old who are at high risk for lung cancer because of a history of smoking.  A yearly low-dose CT scan of the lungs is recommended for people who:  Currently smoke.  Have quit within the past 15 years.  Have at least a 30-pack-year history of smoking. A pack year is smoking an average of one pack of cigarettes a day for 1 year.  Yearly screening should continue until it has been 15 years since you quit.  Yearly screening should stop  if you develop a health problem that would prevent you from having lung cancer treatment.  Breast Cancer  Practice breast self-awareness. This means understanding how your breasts normally appear and feel.  It also means doing regular breast self-exams. Let your health care provider know about any changes, no matter how small.  If you are in your 20s or 30s, you should have a clinical breast exam (CBE) by a health care provider every 1-3 years as part of a regular health exam.  If you are 87 or older, have a CBE every year. Also consider having a breast X-ray (mammogram) every year.  If you have a family history of breast cancer, talk to your health care provider about genetic screening.  If you are at high risk for breast cancer, talk to your health care provider about having an MRI and a mammogram every year.  Breast cancer gene (BRCA) assessment is recommended for women who have family members with BRCA-related cancers. BRCA-related cancers include:  Breast.  Ovarian.  Tubal.  Peritoneal cancers.  Results of the assessment will determine the need for genetic counseling and BRCA1 and BRCA2 testing. Cervical Cancer Your health care provider may recommend that you be screened regularly for cancer of the pelvic organs (ovaries, uterus, and vagina). This screening involves a pelvic examination, including checking for microscopic changes to the surface of your cervix (Pap test). You may be encouraged to have this screening done every 3 years, beginning at age 1.  For women ages 75-65, health care providers may recommend pelvic exams and Pap testing every 3 years, or they may recommend the Pap and pelvic exam, combined with testing for human papilloma virus (HPV), every 5 years. Some types of HPV increase your risk of cervical cancer. Testing for HPV may also be done on women of any age with unclear Pap test results.  Other health care providers may not recommend any screening for  nonpregnant women who are considered low risk for pelvic cancer and who do not have symptoms. Ask your health care provider if a screening pelvic exam is right for you.  If you have had past treatment for cervical cancer or a condition that could lead to cancer, you need Pap tests and screening for cancer for at least 20 years after your treatment. If Pap tests have been discontinued, your risk factors (such as having a new sexual partner) need to be reassessed to determine if screening should resume. Some women have medical problems that increase the chance of getting cervical cancer. In these cases, your health care provider may recommend more frequent screening and Pap tests. Colorectal Cancer  This type of cancer can be detected and often prevented.  Routine colorectal cancer screening usually begins at 75 years of age and continues through 75 years of  age.  Your health care provider may recommend screening at an earlier age if you have risk factors for colon cancer.  Your health care provider may also recommend using home test kits to check for hidden blood in the stool.  A small camera at the end of a tube can be used to examine your colon directly (sigmoidoscopy or colonoscopy). This is done to check for the earliest forms of colorectal cancer.  Routine screening usually begins at age 82.  Direct examination of the colon should be repeated every 5-10 years through 75 years of age. However, you may need to be screened more often if early forms of precancerous polyps or small growths are found. Skin Cancer  Check your skin from head to toe regularly.  Tell your health care provider about any new moles or changes in moles, especially if there is a change in a mole's shape or color.  Also tell your health care provider if you have a mole that is larger than the size of a pencil eraser.  Always use sunscreen. Apply sunscreen liberally and repeatedly throughout the day.  Protect yourself  by wearing long sleeves, pants, a wide-brimmed hat, and sunglasses whenever you are outside. HEART DISEASE, DIABETES, AND HIGH BLOOD PRESSURE   High blood pressure causes heart disease and increases the risk of stroke. High blood pressure is more likely to develop in:  People who have blood pressure in the high end of the normal range (130-139/85-89 mm Hg).  People who are overweight or obese.  People who are African American.  If you are 1-60 years of age, have your blood pressure checked every 3-5 years. If you are 34 years of age or older, have your blood pressure checked every year. You should have your blood pressure measured twice--once when you are at a hospital or clinic, and once when you are not at a hospital or clinic. Record the average of the two measurements. To check your blood pressure when you are not at a hospital or clinic, you can use:  An automated blood pressure machine at a pharmacy.  A home blood pressure monitor.  If you are between 5 years and 101 years old, ask your health care provider if you should take aspirin to prevent strokes.  Have regular diabetes screenings. This involves taking a blood sample to check your fasting blood sugar level.  If you are at a normal weight and have a low risk for diabetes, have this test once every three years after 75 years of age.  If you are overweight and have a high risk for diabetes, consider being tested at a younger age or more often. PREVENTING INFECTION  Hepatitis B  If you have a higher risk for hepatitis B, you should be screened for this virus. You are considered at high risk for hepatitis B if:  You were born in a country where hepatitis B is common. Ask your health care provider which countries are considered high risk.  Your parents were born in a high-risk country, and you have not been immunized against hepatitis B (hepatitis B vaccine).  You have HIV or AIDS.  You use needles to inject street  drugs.  You live with someone who has hepatitis B.  You have had sex with someone who has hepatitis B.  You get hemodialysis treatment.  You take certain medicines for conditions, including cancer, organ transplantation, and autoimmune conditions. Hepatitis C  Blood testing is recommended for:  Everyone born from [birth year]  through 1965.  Anyone with known risk factors for hepatitis C. Sexually transmitted infections (STIs)  You should be screened for sexually transmitted infections (STIs) including gonorrhea and chlamydia if:  You are sexually active and are younger than 75 years of age.  You are older than 75 years of age and your health care provider tells you that you are at risk for this type of infection.  Your sexual activity has changed since you were last screened and you are at an increased risk for chlamydia or gonorrhea. Ask your health care provider if you are at risk.  If you do not have HIV, but are at risk, it may be recommended that you take a prescription medicine daily to prevent HIV infection. This is called pre-exposure prophylaxis (PrEP). You are considered at risk if:  You are sexually active and do not regularly use condoms or know the HIV status of your partner(s).  You take drugs by injection.  You are sexually active with a partner who has HIV. Talk with your health care provider about whether you are at high risk of being infected with HIV. If you choose to begin PrEP, you should first be tested for HIV. You should then be tested every 3 months for as long as you are taking PrEP.  PREGNANCY   If you are premenopausal and you may become pregnant, ask your health care provider about preconception counseling.  If you may become pregnant, take 400 to 800 micrograms (mcg) of folic acid every day.  If you want to prevent pregnancy, talk to your health care provider about birth control (contraception). OSTEOPOROSIS AND MENOPAUSE   Osteoporosis is a disease in  which the bones lose minerals and strength with aging. This can result in serious bone fractures. Your risk for osteoporosis can be identified using a bone density scan.  If you are 74 years of age or older, or if you are at risk for osteoporosis and fractures, ask your health care provider if you should be screened.  Ask your health care provider whether you should take a calcium or vitamin D supplement to lower your risk for osteoporosis.  Menopause may have certain physical symptoms and risks.  Hormone replacement therapy may reduce some of these symptoms and risks. Talk to your health care provider about whether hormone replacement therapy is right for you.  HOME CARE INSTRUCTIONS   Schedule regular health, dental, and eye exams.  Stay current with your immunizations.   Do not use any tobacco products including cigarettes, chewing tobacco, or electronic cigarettes.  If you are pregnant, do not drink alcohol.  If you are breastfeeding, limit how much and how often you drink alcohol.  Limit alcohol intake to no more than 1 drink per day for nonpregnant women. One drink equals 12 ounces of beer, 5 ounces of wine, or 1 ounces of hard liquor.  Do not use street drugs.  Do not share needles.  Ask your health care provider for help if you need support or information about quitting drugs.  Tell your health care provider if you often feel depressed.  Tell your health care provider if you have ever been abused or do not feel safe at home.   This information is not intended to replace advice given to you by your health care provider. Make sure you discuss any questions you have with your health care provider.   Document Released: 11/04/2010 Document Revised: 05/12/2014 Document Reviewed: 03/23/2013 Elsevier Interactive Patient Education [employer].

## 2016-02-21 NOTE — Progress Notes (Addendum)
Patient ID: Ellen Woods, female   DOB: February 19, 1941, 75 y.o.   MRN: [id number]     Subjective:   Ellen Woods is a 75 y.o. female who presents for an Initial Medicare Annual Wellness Visit.  Ellen Woods is widowed since 2009.  She lives with her daughter.  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   She has had 2 visits with PT for low back pain.  They have suggested trigger point dry needling and patient is considering.   Ellen Woods uses a cane for ambulation and states she is afraid of falling again.   She has osteoporosis and is taking Prolia Q6 months.  Last injection self administered yesterday.   Current Medications (verified) Outpatient Encounter Prescriptions as of 02/21/2016  Medication Sig  . ACCU-CHEK FASTCLIX LANCETS MISC USE FOR FINGERSTICK BLOOD SUGAR TESTING SIX TIMES DAILY  . ACCU-CHEK SMARTVIEW test strip USE FOR FINGERSTICK BLOOD SUGAR TESTING SIX TIMES DAILY  . acetaminophen (TYLENOL) 325 MG tablet Take 2 tablets (650 mg total) by mouth every 6 (six) hours as needed.  . Alcohol Swabs (B-D SINGLE USE SWABS REGULAR) PADS USE FOR FINGERSTICK BLOOD SUGAR TESTING SIX TIMES DAILY  . ALPRAZolam (XANAX) 0.5 MG tablet TAKE 1 TABLET TWICE DAILY AS NEEDED  . aspirin 81 MG tablet Take 81 mg by mouth daily.  . bisacodyl (DULCOLAX) 5 MG EC tablet Take 5 mg by mouth as needed for moderate constipation.  . Calcium Carb-Cholecalciferol (CALCIUM 500 + D3) 500-600 MG-UNIT TABS Take 1 tablet by mouth daily.  . ciclopirox (PENLAC) 8 % solution Apply topically at bedtime. Apply over nail and surrounding skin. Apply daily over previous coat. After seven (7) days, may remove with alcohol and continue cycle.  . cyclobenzaprine (FLEXERIL) 10 MG tablet Take 1 tablet (10 mg total) by mouth 3 (three) times daily as needed for muscle spasms.  . diclofenac (VOLTAREN) 75 MG EC tablet Take 1 tablet (75 mg total) by mouth 2 (two) times daily. For muscle and  Joint pain  . docusate sodium  100 MG CAPS Take 100 mg by mouth 2 (two) times daily.  . furosemide (LASIX) 40 MG tablet Take 40 mg by mouth daily.   . insulin aspart (NOVOLOG) 100 UNIT/ML FlexPen Inject 2-4 Units into the skin 3 (three) times daily with meals.  . Insulin Glargine (LANTUS SOLOSTAR [place]) Inject 14 Units into the skin daily with breakfast.  . levothyroxine (SYNTHROID, LEVOTHROID) 50 MCG tablet Take 1 tablet (50 mcg total) by mouth daily before breakfast.  . lisinopril (PRINIVIL,ZESTRIL) 20 MG tablet TAKE 1 TABLET EVERY DAY  . metoprolol tartrate (LOPRESSOR) 25 MG tablet TAKE 1 TABLET TWICE DAILY  . mometasone (NASONEX) 50 MCG/ACT nasal spray Place 2 sprays into the nose daily.  . Multiple Vitamin (MULTIVITAMIN) tablet Take 1 tablet by mouth daily.  Marland Kitchen NITROSTAT 0.4 MG SL tablet Place 1 tablet (0.4 mg total) under the tongue every 5 (five) minutes as needed for chest pain.  . potassium chloride SA (K-DUR,KLOR-CON) 20 MEQ tablet TAKE 1 TABLET EVERY DAY  . PROLIA 60 MG/ML SOLN injection   . [DISCONTINUED] psyllium (METAMUCIL) 58.6 % powder Take 1 packet by mouth daily.  . polyethylene glycol powder (GLYCOLAX/MIRALAX) powder Mix 1 capsul (=17grams) with beverage of your choice.  Drink entire mixture once a day.  . [DISCONTINUED] Blood Glucose Calibration (TRUE METRIX LEVEL 1) Low SOLN   . [DISCONTINUED] Blood Glucose Monitoring Suppl (TRUE METRIX AIR GLUCOSE METER) w/Device KIT  No facility-administered encounter medications on file as of 02/21/2016.     Allergies (verified) Azithromycin; Codeine; and Invokana [canagliflozin]   History: Past Medical History:  Diagnosis Date  . Arthritis   . Back pain 1/14   Tx by orthopedics  . COPD (chronic obstructive pulmonary disease) ([place])   . Coronary atherosclerosis of native coronary artery    50% LAD stenosis  . Encounter for long-term (current) use of insulin ([place])   . Family history of anesthesia complication    Daughter has nausea  . PDA (patent ductus  arteriosus)    Small PDA documented by CT angiography, no pulmonary hypertension  . Pericardial calcification    Ruled out for restrictive pericarditis  . Thyroid disease   . Type II or unspecified type diabetes mellitus without mention of complication, not stated as uncontrolled   . Unspecified essential hypertension    Past Surgical History:  Procedure Laterality Date  . ABDOMINAL HYSTERECTOMY     partial  . INCISIONAL HERNIA REPAIR    . INTRAMEDULLARY (IM) NAIL INTERTROCHANTERIC Left 11/13/2012   Procedure: INTRAMEDULLARY (IM) NAIL INTERTROCHANTRIC LEFT HIP;  Surgeon: Hessie Dibble, MD;  Location: [facility];  Service: Orthopedics;  Laterality: Left;  . TUBAL LIGATION     Family History  Problem Relation Age of Onset  . Stroke Mother   . Hypertension Mother   . Diabetes Brother   . Early death Brother   . Diabetes Son 57  . Stroke Brother   . Diabetes Brother 6    type 1   Social History   Occupational History  . Not on file.   Social History Main Topics  . Smoking status: Never Smoker  . Smokeless tobacco: Never Used  . Alcohol use No  . Drug use: No  . Sexual activity: No    Do you feel safe at home?  Yes Are there smokers in your home (other than you)? No  Dietary issues and exercise activities: Current Exercise Habits: The patient does not participate in regular exercise at present (she is recieveing PT currently), Exercise limited by: orthopedic condition(s)  Current Dietary habits:  Has been limiting CHO intake and following diet from Dr Dorris Fetch which is also high protein.  No beverages with sugar or sweetners.  Objective:    Today's Vitals   02/21/16 1136  BP: 120/68  Pulse: 70  Weight: 117 lb (53.1 kg)  Height: 4' 11"  (1.499 m)  PainSc: 4   PainLoc: Back   Body mass index is 23.63 kg/m.  Activities of Daily Living In your present state of health, do you have any difficulty performing the following activities: 02/21/2016 10/17/2015  Hearing? N N    Vision? N N  Difficulty concentrating or making decisions? Y N  Walking or climbing stairs? Y N  Dressing or bathing? N N  Doing errands, shopping? N N  Preparing Food and eating ? N -  Using the Toilet? N -  In the past six months, have you accidently leaked urine? N -  Do you have problems with loss of bowel control? N -  Managing your Medications? N -  Managing your Finances? N -  Housekeeping or managing your Housekeeping? N -  Some recent data might be hidden     Cardiac Risk Factors include: advanced age (>67mn, >>78women);diabetes mellitus;dyslipidemia;family history of premature cardiovascular disease;sedentary lifestyle  Depression Screen PHQ 2/9 Scores 02/21/2016 02/07/2016 01/25/2016 10/17/2015  PHQ - 2 Score 0 0 0 0  Fall Risk Fall Risk  02/21/2016 02/07/2016 10/17/2015 08/06/2015 07/04/2015  Falls in the past year? Yes Yes No No No  Number falls in past yr: 1 1 - - -  Injury with Fall? No No - - -  Risk for fall due to : History of fall(s);Impaired balance/gait - - - -  Follow up Falls prevention discussed - - - -    Cognitive Function: MMSE - Mini Mental State Exam 08/14/2015  Orientation to time 5  Orientation to Place 5  Registration 3  Attention/ Calculation 5  Recall 2  Language- name 2 objects 2  Language- repeat 1  Language- follow 3 step command 3  Language- read & follow direction 1  Write a sentence 1  Copy design 0  Total score 28    Immunizations and Health Maintenance Immunization History  Administered Date(s) Administered  . Influenza, High Dose Seasonal PF 02/21/2016  . Influenza,inj,Quad PF,36+ Mos 03/02/2014, 02/23/2015  . Influenza-Unspecified 02/21/2013  . Pneumococcal Conjugate-13 03/02/2014  . Zoster 09/02/2013   Health Maintenance Due  Topic Date Due  . TETANUS/TDAP  10/03/2013  . PNA vac Low Risk Adult (2 of 2 - PPSV23) 03/03/2015  . FOOT EXAM  07/21/2015    Patient Care Team: Claretta Fraise, MD as PCP - General (Family  Medicine) Cassandria Anger, MD as Consulting Physician (Endocrinology) Minus Breeding, MD as Consulting Physician (Cardiology)  Indicate any recent Medical Services you may have received from other than [facility] providers in the past year (date may be approximate).    Assessment:    Annual Wellness Visit  Osteoporosis Hyperlipidemia Constipation - patient taking daily stool softner, daily miralax and twice per week laxative.   Screening Tests Health Maintenance  Topic Date Due  . TETANUS/TDAP  10/03/2013  . PNA vac Low Risk Adult (2 of 2 - PPSV23) 03/03/2015  . FOOT EXAM  07/21/2015  . INFLUENZA VACCINE  08/02/2016 (Originally 12/04/2015)  . OPHTHALMOLOGY EXAM  02/22/2016  . HEMOGLOBIN A1C  07/17/2016  . DEXA SCAN  02/20/2017  . COLONOSCOPY  08/25/2017  . ZOSTAVAX  Completed        Plan:   During the course of the visit Sacha was educated and counseled about the following appropriate screening and preventive services:   Vaccines to include Pneumoccal, Influenza, Td, Zostavax - influenza vaccine given in office today.  Patient is due Pneumovax 23 but she did not want to get 2 vaccines in 1 day so will received at next visit.  Also discussed getting Tdap in future.  Colorectal cancer screening - UTD  Cardiovascular disease screening - EKG is UTD  Lipids - LDL elevated at last check 05/2015 - discussed statin therapy.  Patient refused  Diabetes - A1c much improved since seeing Dr Dorris Fetch.  Continue follow up with him as planned  Bone Denisty / Osteoporosis Screening - continue Prolia 42m SQ q 6 months - next DEXA 02/2015  Mammogram - due 05/2015 - appt make today  PAP - no longer required  Glaucoma screening / Diabetic Eye Exam - due now; reminded patient and number given for her to call for appt  Nutrition counseling - continue with CHO counting  / high protein diet per Dr NDorris Fetch Advanced Directives - information given and discussed  Physical Activity - continue with  PT  High Fall Risk - continue with PT - they are working on strength building which should help patient prevent falls and feel more stable.   D/C metamucil.  Try Miralax 17 gram with water or other unsweetened beverage.  Drink once daily.  Call office if still having to take laxative more than twice per week.   Orders Placed This Encounter  Procedures  . Flu vaccine HIGH DOSE PF  . Microalbumin / creatinine urine ratio      Patient Instructions (the written plan) were given to the patient.   Cherre Robins, PharmD   02/21/2016     I have reviewed and agree with the above AWV documentation.  Claretta Fraise, M.D.

## 2016-02-22 ENCOUNTER — Ambulatory Visit: Payer: Self-pay | Admitting: Pharmacist

## 2016-02-25 ENCOUNTER — Other Ambulatory Visit: Payer: Self-pay | Admitting: Family Medicine

## 2016-02-26 ENCOUNTER — Ambulatory Visit: Payer: Commercial Managed Care - HMO | Admitting: Physical Therapy

## 2016-02-26 DIAGNOSIS — M6281 Muscle weakness (generalized): Secondary | ICD-10-CM | POA: Diagnosis not present

## 2016-02-26 DIAGNOSIS — M25552 Pain in left hip: Secondary | ICD-10-CM

## 2016-02-26 DIAGNOSIS — M545 Low back pain, unspecified: Secondary | ICD-10-CM

## 2016-02-26 NOTE — Therapy (Signed)
[facility] [address] [place], [place], [address] Phone: [phone]   Fax:  [phone]  Physical Therapy Treatment  Patient Details  Name: Ellen Woods MRN: [id number] Date of Birth: 12/23/1940 Referring Provider: Mechele ClaudeWarren Stacks MD  Encounter Date: 02/26/2016      PT End of Session - 02/26/16 1036    Visit Number 3   Number of Visits 12   Date for PT Re-Evaluation 03/25/16   PT Start Time 1031   PT Stop Time 1133   PT Time Calculation (min) 62 min   Activity Tolerance Patient tolerated treatment well   Behavior During Therapy [facility] for tasks assessed/performed      Past Medical History:  Diagnosis Date  . Arthritis   . Back pain 1/14   Tx by orthopedics  . COPD (chronic obstructive pulmonary disease) (HCC)   . Coronary atherosclerosis of native coronary artery    50% LAD stenosis  . Encounter for long-term (current) use of insulin (HCC)   . Family history of anesthesia complication    Daughter has nausea  . PDA (patent ductus arteriosus)    Small PDA documented by CT angiography, no pulmonary hypertension  . Pericardial calcification    Ruled out for restrictive pericarditis  . Thyroid disease   . Type II or unspecified type diabetes mellitus without mention of complication, not stated as uncontrolled   . Unspecified essential hypertension     Past Surgical History:  Procedure Laterality Date  . ABDOMINAL HYSTERECTOMY     partial  . INCISIONAL HERNIA REPAIR    . INTRAMEDULLARY (IM) NAIL INTERTROCHANTERIC Left 11/13/2012   Procedure: INTRAMEDULLARY (IM) NAIL INTERTROCHANTRIC LEFT HIP;  Surgeon: Velna OchsPeter G Dalldorf, MD;  Location: [facility] OR;  Service: Orthopedics;  Laterality: Left;  . TUBAL LIGATION      There were no vitals filed for this visit.      Subjective Assessment - 02/26/16 1037    Subjective Patient has same complaints. Sitting on the commode bothers her and sit to stand as well.   Pertinent History  Osteoporosis, DM, COPD, arythmia, L hip surgery (nail) 2014.   How long can you sit comfortably? 20 min   Patient Stated Goals to get rid of pain   Currently in Pain? Yes   Pain Score 6    Pain Location Back   Pain Orientation Mid;Lower   Pain Descriptors / Indicators Sharp   Pain Type Acute pain                           Trigger Point Dry Needling - 02/26/16 1231    Consent Given? Yes   Education Handout Provided Yes   Muscles Treated Upper Body Longissimus;Quadratus Lumborum   Muscles Treated Lower Body Gluteus maximus   Longissimus Response Twitch response elicited;Palpable increased muscle length  mulitifidi and QL B   Gluteus Maximus Response Twitch response elicited;Palpable increased muscle length  Left glut med              PT Education - 02/26/16 1122    Education provided Yes   Education Details DN education review   Person(s) Educated Patient   Methods Explanation;Handout   Comprehension Verbalized understanding          PT Short Term Goals - 02/12/16 1243      PT SHORT TERM GOAL #1   Title I with HEP   Time 3   Period Weeks   Status New  PT Long Term Goals - 02/12/16 1244      PT LONG TERM GOAL #1   Title Patient to be able to sit for 45 min with pain 2/10 or less   Time 6   Period Weeks   Status New     PT LONG TERM GOAL #2   Title Decreased pain with ADLS to 3-4/10.   Time 6   Period Weeks   Status New     PT LONG TERM GOAL #3   Title Patient able to sleep without waking from LBP   Time 6   Period Weeks   Status New     PT LONG TERM GOAL #4   Title Improved B hip strength to 4+/5 or better to help normalize gait.   Time 6   Period Weeks   Status New               Plan - 02/26/16 1224    Clinical Impression Statement Patient presented today requesting dry needling after researching it more. She tolerated the DN well with ++LTR in R lumbar paraspinals and L glut medius. Goals are ongoing.    Rehab Potential Excellent   PT Frequency 2x / week   PT Duration 6 weeks   PT Treatment/Interventions ADLs/Self Care Home Management;Electrical Stimulation;Moist Heat;Ultrasound;Therapeutic exercise;Neuromuscular re-education;Patient/family education;Passive range of motion;Manual techniques;Dry needling   PT Next Visit Plan Assess DN; continue as indicated. Continue manual/STW. Possible [place].   PT Home Exercise Plan bridge, piriformis stretch   Consulted and Agree with Plan of Care Patient      Patient will benefit from skilled therapeutic intervention in order to improve the following deficits and impairments:  Abnormal gait, Decreased range of motion, Pain, Decreased strength, Postural dysfunction  Visit Diagnosis: Acute midline low back pain without sciatica  Pain in left hip     Problem List Patient Active Problem List   Diagnosis Date Noted  . Pathological fracture of left femur due to age-related osteoporosis with routine healing 06/06/2015  . Diabetic hypoglycemia (HCC) 05/29/2015  . Hypokalemia 07/21/2014  . Other dietary vitamin B12 deficiency anemia 07/21/2014  . Other seasonal allergic rhinitis 07/21/2014  . Hypothyroidism 07/21/2014  . Transaminitis   . Diabetes type 2, uncontrolled (HCC)   . Metabolic encephalopathy   . Constipation 03/15/2014  . SIRS (systemic inflammatory response syndrome) (HCC) 03/14/2014  . Chronic diastolic heart failure (HCC) 03/14/2014  . Osteoporosis 10/25/2012  . PAF (paroxysmal atrial fibrillation) (HCC) 03/15/2012  . Carotid bruit 03/15/2012  . Pericardial calcification   . PDA (patent ductus arteriosus)   . Coronary atherosclerosis of native coronary artery   . Hyperlipidemia with target LDL less than 100 03/18/2010  . Essential hypertension, benign 03/18/2010    Solon Palm PT 02/26/2016, 12:32 PM  [facility] [address] [place], [place], [address] Phone: [phone]   Fax:   [phone]  Name: Ellen Woods MRN: [id number] Date of Birth: Mar 29, 1941

## 2016-02-26 NOTE — Patient Instructions (Signed)
Trigger Point Dry Needling  . What is Trigger Point Dry Needling (DN)? o DN is a physical therapy technique used to treat muscle pain and dysfunction. Specifically, DN helps deactivate muscle trigger points (muscle knots).  o A thin filiform needle is used to penetrate the skin and stimulate the underlying trigger point. The goal is for a local twitch response (LTR) to occur and for the trigger point to relax. No medication of any kind is injected during the procedure.   . What Does Trigger Point Dry Needling Feel Like?  o The procedure feels different for each individual patient. Some patients report that they do not actually feel the needle enter the skin and overall the process is not painful. Very mild bleeding may occur. However, many patients feel a deep cramping in the muscle in which the needle was inserted. This is the local twitch response.   Marland Kitchen. How Will I feel after the treatment? o Soreness is normal, and the onset of soreness may not occur for a few hours. Typically this soreness does not last longer than two days.  o Bruising is uncommon, however; ice can be used to decrease any possible bruising.  o In rare cases feeling tired or nauseous after the treatment is normal. In addition, your symptoms may get worse before they get better, this period will typically not last longer than 24 hours.   . What Can I do After My Treatment? o Increase your hydration by drinking more water for the next 24 hours. o You may place ice or heat on the areas treated that have become sore, however, do not use heat on inflamed or bruised areas. Heat often brings more relief post needling. o You can continue your regular activities, but vigorous activity is not recommended initially after the treatment for 24 hours. o DN is best combined with other physical therapy such as strengthening, stretching, and other therapies.    Precautions:  In some cases, dry needling is done over the lung field. While rare,  there is a risk of pneumothorax (punctured lung). Because of this, if you ever experience shortness of breath on exertion, difficulty taking a deep breath, chest pain or a dry cough following dry needling, you should report to an emergency room and tell them that you have been dry needled over the thorax.   Solon PalmJulie Lexey Fletes, PT 02/26/16 11:22 AM [facility] [address] [place], [place], [address] Phone: [phone]   Fax:  [phone]

## 2016-03-04 ENCOUNTER — Other Ambulatory Visit: Payer: Self-pay | Admitting: "Endocrinology

## 2016-03-04 ENCOUNTER — Ambulatory Visit: Payer: Commercial Managed Care - HMO | Admitting: Physical Therapy

## 2016-03-04 DIAGNOSIS — M6281 Muscle weakness (generalized): Secondary | ICD-10-CM | POA: Diagnosis not present

## 2016-03-04 DIAGNOSIS — M545 Low back pain: Secondary | ICD-10-CM | POA: Diagnosis not present

## 2016-03-04 DIAGNOSIS — M25552 Pain in left hip: Secondary | ICD-10-CM | POA: Diagnosis not present

## 2016-03-04 NOTE — Patient Instructions (Signed)
Knee-to-Chest Stretch: Unilateral    With hand behind right knee, pull knee in to chest until a comfortable stretch is felt in lower back and buttocks. Keep back relaxed. Hold _30___ seconds. Repeat _3___ times per set. Do ____ sets per session. Do __2__ sessions per day.    Stretching: Piriformis (Supine)  Pull right knee toward opposite shoulder. Hold _30___ seconds. Relax. Repeat __3__ times per set. Do ____ sets per session. Do __2__ sessions per day.  Ellen Woods, PT 03/04/16 2:32 PM; [facility] [address] [place], [place], [address] Phone: [phone]   Fax:  [phone]

## 2016-03-04 NOTE — Therapy (Signed)
[facility] [address] [place], [place], [address] Phone: [phone]   Fax:  [phone]  Physical Therapy Treatment  Patient Details  Name: Ellen HaverGlenda J Woods MRN: [id number] Date of Birth: 12/31/1940 Referring Provider: Mechele ClaudeWarren Stacks MD  Encounter Date: 03/04/2016      PT End of Session - 03/04/16 1347    Visit Number 4   Number of Visits 12   Date for PT Re-Evaluation 03/25/16   PT Start Time 1350   PT Stop Time 1446   PT Time Calculation (min) 56 min   Activity Tolerance Patient tolerated treatment well   Behavior During Therapy [facility] for tasks assessed/performed      Past Medical History:  Diagnosis Date  . Arthritis   . Back pain 1/14   Tx by orthopedics  . COPD (chronic obstructive pulmonary disease) (HCC)   . Coronary atherosclerosis of native coronary artery    50% LAD stenosis  . Encounter for long-term (current) use of insulin (HCC)   . Family history of anesthesia complication    Daughter has nausea  . PDA (patent ductus arteriosus)    Small PDA documented by CT angiography, no pulmonary hypertension  . Pericardial calcification    Ruled out for restrictive pericarditis  . Thyroid disease   . Type II or unspecified type diabetes mellitus without mention of complication, not stated as uncontrolled   . Unspecified essential hypertension     Past Surgical History:  Procedure Laterality Date  . ABDOMINAL HYSTERECTOMY     partial  . INCISIONAL HERNIA REPAIR    . INTRAMEDULLARY (IM) NAIL INTERTROCHANTERIC Left 11/13/2012   Procedure: INTRAMEDULLARY (IM) NAIL INTERTROCHANTRIC LEFT HIP;  Surgeon: Velna OchsPeter G Dalldorf, MD;  Location: [facility] OR;  Service: Orthopedics;  Laterality: Left;  . TUBAL LIGATION      There were no vitals filed for this visit.      Subjective Assessment - 03/04/16 1350    Subjective Patient reports no soreness following DN. She states she must take her pain meds everyday, but some days it helps  and sometimes it doesn't. Saturday she was able to walk all around [employer] without pain in back or hip. Today she c/o mainly left hip pain.  She still wake up at night from pain.   Pertinent History Osteoporosis, DM, COPD, arythmia, L hip surgery (nail) 2014.   How long can you sit comfortably? 20 min   Patient Stated Goals to get rid of pain   Currently in Pain? Yes   Pain Score 5    Pain Location Hip   Pain Orientation Left   Pain Descriptors / Indicators Aching   Pain Type Surgical pain   Pain Onset 1 to 4 weeks ago   Pain Frequency Constant                         [facility] Adult PT Treatment/Exercise - 03/04/16 0001      Modalities   Modalities Electrical Stimulation;Moist Heat     Moist Heat Therapy   Number Minutes Moist Heat 15 Minutes   Moist Heat Location Hip;Lumbar Spine     Electrical Stimulation   Electrical Stimulation Location Premod to lumbar and left hip 80-150Hz  x 15 min   Electrical Stimulation Goals Pain     Manual Therapy   Manual Therapy Soft tissue mobilization   Soft tissue mobilization Left gluteals and piriformis          Trigger Point Dry  Needling - 03/04/16 1443    Consent Given? Yes   Education Handout Provided No   Muscles Treated Lower Body Gluteus minimus;Gluteus maximus;Piriformis  and glut med   Gluteus Maximus Response Twitch response elicited;Palpable increased muscle length   Gluteus Minimus Response Twitch response elicited;Palpable increased muscle length   Piriformis Response Twitch response elicited;Palpable increased muscle length              PT Education - 03/04/16 1443    Education provided Yes   Education Details HEP   Person(s) Educated Patient   Methods Explanation;Demonstration;Handout   Comprehension Verbalized understanding;Returned demonstration          PT Short Term Goals - 02/12/16 1243      PT SHORT TERM GOAL #1   Title I with HEP   Time 3   Period Weeks   Status New            PT Long Term Goals - 03/04/16 1444      PT LONG TERM GOAL #1   Title Patient to be able to sit for 45 min with pain 2/10 or less   Time 6   Period Weeks   Status On-going     PT LONG TERM GOAL #2   Title Decreased pain with ADLS to 3-4/10.   Time 6   Period Weeks   Status On-going     PT LONG TERM GOAL #3   Title Patient able to sleep without waking from LBP   Time 6   Period Weeks   Status On-going     PT LONG TERM GOAL #4   Title Improved B hip strength to 4+/5 or better to help normalize gait.   Time 6   Period Weeks   Status On-going               Plan - 03/04/16 1444    Clinical Impression Statement Patient presents today with no pain in the low back, but c/o pain in the left hip. She reports relief in low back after dry needling, but states she must continue pain meds for her hip pain.  She responded well to DN and manual therapy today. Goals are ongoing.   Rehab Potential Excellent   PT Frequency 2x / week   PT Duration 6 weeks   PT Treatment/Interventions ADLs/Self Care Home Management;Electrical Stimulation;Moist Heat;Ultrasound;Therapeutic exercise;Neuromuscular re-education;Patient/family education;Passive range of motion;Manual techniques;Dry needling   PT Next Visit Plan Assess DN to hip; continue as indicated. Continue manual/STW. Possible [place].   PT Home Exercise Plan SKTC and piriformis stretch; bridge, piriformis stretch   Consulted and Agree with Plan of Care Patient      Patient will benefit from skilled therapeutic intervention in order to improve the following deficits and impairments:  Abnormal gait, Decreased range of motion, Pain, Decreased strength, Postural dysfunction  Visit Diagnosis: Pain in left hip     Problem List Patient Active Problem List   Diagnosis Date Noted  . Pathological fracture of left femur due to age-related osteoporosis with routine healing 06/06/2015  . Diabetic hypoglycemia (HCC) 05/29/2015  .  Hypokalemia 07/21/2014  . Other dietary vitamin B12 deficiency anemia 07/21/2014  . Other seasonal allergic rhinitis 07/21/2014  . Hypothyroidism 07/21/2014  . Transaminitis   . Diabetes type 2, uncontrolled (HCC)   . Metabolic encephalopathy   . Constipation 03/15/2014  . SIRS (systemic inflammatory response syndrome) (HCC) 03/14/2014  . Chronic diastolic heart failure (HCC) 03/14/2014  . Osteoporosis 10/25/2012  . PAF (  paroxysmal atrial fibrillation) (HCC) 03/15/2012  . Carotid bruit 03/15/2012  . Pericardial calcification   . PDA (patent ductus arteriosus)   . Coronary atherosclerosis of native coronary artery   . Hyperlipidemia with target LDL less than 100 03/18/2010  . Essential hypertension, benign 03/18/2010    Solon PalmJulie Ramel Tobon PT 03/04/2016, 3:42 PM  [facility] [address] [place], [place], [address] Phone: [phone]   Fax:  [phone]  Name: Ellen HaverGlenda J Woods MRN: [id number] Date of Birth: 01/24/1941

## 2016-03-06 ENCOUNTER — Encounter: Payer: Self-pay | Admitting: "Endocrinology

## 2016-03-06 DIAGNOSIS — E109 Type 1 diabetes mellitus without complications: Secondary | ICD-10-CM | POA: Diagnosis not present

## 2016-03-06 DIAGNOSIS — H5200 Hypermetropia, unspecified eye: Secondary | ICD-10-CM | POA: Diagnosis not present

## 2016-03-06 DIAGNOSIS — Z0100 Encounter for examination of eyes and vision without abnormal findings: Secondary | ICD-10-CM | POA: Diagnosis not present

## 2016-03-06 LAB — HM DIABETES EYE EXAM

## 2016-03-11 ENCOUNTER — Ambulatory Visit: Payer: Commercial Managed Care - HMO | Attending: Family Medicine | Admitting: *Deleted

## 2016-03-11 DIAGNOSIS — M25552 Pain in left hip: Secondary | ICD-10-CM | POA: Diagnosis not present

## 2016-03-11 DIAGNOSIS — M6281 Muscle weakness (generalized): Secondary | ICD-10-CM | POA: Diagnosis not present

## 2016-03-11 DIAGNOSIS — M545 Low back pain, unspecified: Secondary | ICD-10-CM

## 2016-03-11 NOTE — Therapy (Signed)
[facility] [address] [place], [place], [address] Phone: [phone]   Fax:  [phone]  Physical Therapy Treatment  Patient Details  Name: Ellen HaverGlenda J Rohl MRN: [id number] Date of Birth: 10/11/1940 Referring Provider: Mechele ClaudeWarren Stacks MD  Encounter Date: 03/11/2016      PT End of Session - 03/11/16 1358    Visit Number 5   Number of Visits 12   Date for PT Re-Evaluation 03/25/16   PT Start Time 1300   PT Stop Time 1352   PT Time Calculation (min) 52 min      Past Medical History:  Diagnosis Date  . Arthritis   . Back pain 1/14   Tx by orthopedics  . COPD (chronic obstructive pulmonary disease) (HCC)   . Coronary atherosclerosis of native coronary artery    50% LAD stenosis  . Encounter for long-term (current) use of insulin (HCC)   . Family history of anesthesia complication    Daughter has nausea  . PDA (patent ductus arteriosus)    Small PDA documented by CT angiography, no pulmonary hypertension  . Pericardial calcification    Ruled out for restrictive pericarditis  . Thyroid disease   . Type II or unspecified type diabetes mellitus without mention of complication, not stated as uncontrolled   . Unspecified essential hypertension     Past Surgical History:  Procedure Laterality Date  . ABDOMINAL HYSTERECTOMY     partial  . INCISIONAL HERNIA REPAIR    . INTRAMEDULLARY (IM) NAIL INTERTROCHANTERIC Left 11/13/2012   Procedure: INTRAMEDULLARY (IM) NAIL INTERTROCHANTRIC LEFT HIP;  Surgeon: Velna OchsPeter G Dalldorf, MD;  Location: [facility] OR;  Service: Orthopedics;  Laterality: Left;  . TUBAL LIGATION      There were no vitals filed for this visit.      Subjective Assessment - 03/11/16 1255    Subjective Patient reports no soreness following DN. She states she must take her pain meds everyday, but some days it helps and sometimes it doesn't. Saturday she was able to walk all around [employer] without pain in back or hip. Today she  c/o mainly left hip pain.  She still wake up at night from pain.   Pertinent History Osteoporosis, DM, COPD, arythmia, L hip surgery (nail) 2014.   How long can you sit comfortably? 20 min   Patient Stated Goals to get rid of pain   Currently in Pain? Yes   Pain Score 8    Pain Location Hip   Pain Orientation Left   Pain Descriptors / Indicators Aching   Pain Onset 1 to 4 weeks ago   Pain Frequency Constant                         [facility] Adult PT Treatment/Exercise - 03/11/16 0001      Modalities   Modalities Electrical Stimulation;Moist Heat;Ultrasound     Moist Heat Therapy   Number Minutes Moist Heat 15 Minutes   Moist Heat Location Hip;Lumbar Spine     Electrical Stimulation   Electrical Stimulation Location Premod to lumbar and left hip 80-150Hz  x 15 min   Electrical Stimulation Goals Pain     Ultrasound   Ultrasound Location LT LB paras 1.5 w/cm2 x 10 mins in RT sidelying     Manual Therapy   Manual Therapy Soft tissue mobilization   Soft tissue mobilization Left gluteals and piriformis, LT SIJ and paras LT side in RT sidelying  PT Short Term Goals - 02/12/16 1243      PT SHORT TERM GOAL #1   Title I with HEP   Time 3   Period Weeks   Status New           PT Long Term Goals - 03/04/16 1444      PT LONG TERM GOAL #1   Title Patient to be able to sit for 45 min with pain 2/10 or less   Time 6   Period Weeks   Status On-going     PT LONG TERM GOAL #2   Title Decreased pain with ADLS to 3-4/10.   Time 6   Period Weeks   Status On-going     PT LONG TERM GOAL #3   Title Patient able to sleep without waking from LBP   Time 6   Period Weeks   Status On-going     PT LONG TERM GOAL #4   Title Improved B hip strength to 4+/5 or better to help normalize gait.   Time 6   Period Weeks   Status On-going               Plan - 03/11/16 1346    Clinical Impression Statement Pt did fairly well today with  US, STW, and Heat/ estim. She was sore with STW in LT LB paras,SIJ, and into LT glute and hip. Normal response to modalities today and decreased pain after Rx.   Rehab Potential Excellent   PT Frequency 2x / week   PT Duration 6 weeks   PT Treatment/Interventions ADLs/Self Care Home Management;Electrical Stimulation;Moist Heat;Ultrasound;Therapeutic exercise;Neuromuscular re-education;Patient/family education;Passive range of motion;Manual techniques;Dry needling   PT Next Visit Plan Assess DN to hip; continue as indicated. Continue manual/STW. Possible US.   PT Home Exercise Plan SKTC and piriformis stretch; bridge, piriformis stretch   Consulted and Agree with Plan of Care Patient      Patient will benefit from skilled therapeutic intervention in order to improve the following deficits and impairments:  Abnormal gait, Decreased range of motion, Pain, Decreased strength, Postural dysfunction  Visit Diagnosis: Pain in left hip  Acute midline low back pain without sciatica  Muscle weakness (generalized)     Problem List Patient Active Problem List   Diagnosis Date Noted  . Pathological fracture of left femur due to age-related osteoporosis with routine healing 06/06/2015  . Diabetic hypoglycemia (HCC) 05/29/2015  . Hypokalemia 07/21/2014  . Other dietary vitamin B12 deficiency anemia 07/21/2014  . Other seasonal allergic rhinitis 07/21/2014  . Hypothyroidism 07/21/2014  . Transaminitis   . Diabetes type 2, uncontrolled (HCC)   . Metabolic encephalopathy   . Constipation 03/15/2014  . SIRS (systemic inflammatory response syndrome) (HCC) 03/14/2014  . Chronic diastolic heart failure (HCC) 03/14/2014  . Osteoporosis 10/25/2012  . PAF (paroxysmal atrial fibrillation) (HCC) 03/15/2012  . Carotid bruit 03/15/2012  . Pericardial calcification   . PDA (patent ductus arteriosus)   . Coronary atherosclerosis of native coronary artery   . Hyperlipidemia with target LDL less than 100  03/18/2010  . Essential hypertension, benign 03/18/2010    RAMSEUR,CHRIS , PTA 03/11/2016, 1:58 PM  [facility] [address] [place], [place], [address] Phone: [phone]   Fax:  [phone]  Name: Ellen HaverGlenda J Woods MRN: [id number] Date of Birth: 03/15/1941

## 2016-03-18 ENCOUNTER — Ambulatory Visit: Payer: Commercial Managed Care - HMO | Admitting: Physical Therapy

## 2016-04-09 ENCOUNTER — Other Ambulatory Visit: Payer: Self-pay | Admitting: Family Medicine

## 2016-04-25 ENCOUNTER — Ambulatory Visit (INDEPENDENT_AMBULATORY_CARE_PROVIDER_SITE_OTHER): Payer: Commercial Managed Care - HMO | Admitting: Family Medicine

## 2016-04-25 ENCOUNTER — Encounter: Payer: Self-pay | Admitting: Family Medicine

## 2016-04-25 VITALS — BP 152/49 | HR 66 | Temp 96.7°F | Ht 59.0 in | Wt 119.0 lb

## 2016-04-25 DIAGNOSIS — Z794 Long term (current) use of insulin: Secondary | ICD-10-CM | POA: Diagnosis not present

## 2016-04-25 DIAGNOSIS — IMO0002 Reserved for concepts with insufficient information to code with codable children: Secondary | ICD-10-CM

## 2016-04-25 DIAGNOSIS — E1159 Type 2 diabetes mellitus with other circulatory complications: Secondary | ICD-10-CM

## 2016-04-25 DIAGNOSIS — E1165 Type 2 diabetes mellitus with hyperglycemia: Secondary | ICD-10-CM

## 2016-04-25 DIAGNOSIS — B351 Tinea unguium: Secondary | ICD-10-CM

## 2016-04-25 LAB — HEMOGLOBIN A1C: Hemoglobin A1C: 7

## 2016-04-25 LAB — BAYER DCA HB A1C WAIVED: HB A1C (BAYER DCA - WAIVED): 7 % — ABNORMAL HIGH (ref <7.0)

## 2016-04-25 NOTE — Progress Notes (Signed)
Subjective:  Patient ID: Ellen Woods, female    DOB: [birth year]  Age: 75 y.o. MRN: [id number]  CC: Nail Problem (pt here today c/o toenail fungus)   HPI CHALEY CASTELLANOS presents fordiabetes. Patient does check blood sugar at home but did not bring her readings with her today. Does not recall any trends. Patient denies symptoms such as polyuria, polydipsia, excessive hunger, nausea No significant hypoglycemic spells noted. Medications as noted below. Taking them regularly without complication/adverse reaction being reported today.   Also concerned about a growth of fungus underneath the toenails. Would like to have treatment for it and requests podiatry.   History Mrytle has a past medical history of Arthritis; Back pain (1/14); COPD (chronic obstructive pulmonary disease) ([place]); Coronary atherosclerosis of native coronary artery; Encounter for long-term (current) use of insulin ([place]); Family history of anesthesia complication; PDA (patent ductus arteriosus); Pericardial calcification; Thyroid disease; Type II or unspecified type diabetes mellitus without mention of complication, not stated as uncontrolled; and Unspecified essential hypertension.   She has a past surgical history that includes Incisional hernia repair; Tubal ligation; Intramedullary (im) nail intertrochanteric (Left, 11/13/2012); and Abdominal hysterectomy.   Her family history includes Diabetes in her brother; Diabetes (age of onset: 55) in her brother; Diabetes (age of onset: 4) in her son; Early death in her brother; Hypertension in her mother; Stroke in her brother and mother.She reports that she has never smoked. She has never used smokeless tobacco. She reports that she does not drink alcohol or use drugs.  Patient Active Problem List   Diagnosis Date Noted  . Pathological fracture of left femur due to age-related osteoporosis with routine healing 06/06/2015  . Diabetic hypoglycemia ([place]) 05/29/2015  . Hypokalemia  07/21/2014  . Other dietary vitamin B12 deficiency anemia 07/21/2014  . Other seasonal allergic rhinitis 07/21/2014  . Hypothyroidism 07/21/2014  . Transaminitis   . Diabetes type 2, uncontrolled ([place])   . Metabolic encephalopathy   . Constipation 03/15/2014  . SIRS (systemic inflammatory response syndrome) ([place]) 03/14/2014  . Chronic diastolic heart failure ([place]) 03/14/2014  . Osteoporosis 10/25/2012  . PAF (paroxysmal atrial fibrillation) ([place]) 03/15/2012  . Carotid bruit 03/15/2012  . Pericardial calcification   . PDA (patent ductus arteriosus)   . Coronary atherosclerosis of native coronary artery   . Hyperlipidemia with target LDL less than 100 03/18/2010  . Essential hypertension, benign 03/18/2010     ROS Review of Systems  Constitutional: Negative for activity change, appetite change and fever.  HENT: Negative for congestion, rhinorrhea and sore throat.   Eyes: Negative for visual disturbance.  Respiratory: Negative for cough and shortness of breath.   Cardiovascular: Negative for chest pain and palpitations.  Gastrointestinal: Negative for abdominal pain, diarrhea and nausea.  Genitourinary: Negative for dysuria.  Musculoskeletal: Negative for arthralgias and myalgias.    Objective:  BP (!) 152/49   Pulse 66   Temp (!) 96.7 F (35.9 C) (Oral)   Ht 4' 11"  (1.499 m)   Wt 119 lb (54 kg)   BMI 24.04 kg/m   BP Readings from Last 3 Encounters:  04/25/16 (!) 152/49  02/21/16 120/68  02/07/16 (!) 118/57    Wt Readings from Last 3 Encounters:  04/25/16 119 lb (54 kg)  02/21/16 117 lb (53.1 kg)  02/07/16 116 lb 2 oz (52.7 kg)     Physical Exam  Constitutional: She is oriented to person, place, and time. She appears well-developed and well-nourished. No distress.  HENT:  Head: Normocephalic  and atraumatic.  Right Ear: External ear normal.  Left Ear: External ear normal.  Nose: Nose normal.  Mouth/Throat: Oropharynx is clear and moist.  Eyes: Conjunctivae  and EOM are normal. Pupils are equal, round, and reactive to light.  Neck: Normal range of motion. Neck supple. No thyromegaly present.  Cardiovascular: Normal rate, regular rhythm and normal heart sounds.   No murmur heard. Pulmonary/Chest: Effort normal and breath sounds normal. No respiratory distress. She has no wheezes. She has no rales.  Abdominal: Soft. Bowel sounds are normal. She exhibits no distension. There is no tenderness.  Lymphadenopathy:    She has no cervical adenopathy.  Neurological: She is alert and oriented to person, place, and time. She has normal reflexes.  Skin: Skin is warm and dry.  Psychiatric: She has a normal mood and affect. Her behavior is normal. Judgment and thought content normal.     Lab Results  Component Value Date   WBC 5.9 05/29/2015   HGB 12.8 10/13/2014   HCT 38.2 05/29/2015   PLT 264 05/29/2015   GLUCOSE 233 (H) 04/25/2016   CHOL 226 (H) 05/29/2015   TRIG 102 05/29/2015   HDL 74 05/29/2015   LDLCALC 132 (H) 05/29/2015   ALT 24 04/25/2016   AST 28 04/25/2016   NA 139 04/25/2016   K 4.6 04/25/2016   CL 96 04/25/2016   CREATININE 1.26 (H) 04/25/2016   BUN 47 (H) 04/25/2016   CO2 23 04/25/2016   TSH 2.340 04/25/2016   INR 1.01 11/12/2012   HGBA1C 6.8 (H) 01/18/2016   MICROALBUR 20 07/21/2014    Ct Head Wo Contrast  Result Date: 03/14/2014 CLINICAL DATA:  75 year old diabetic female with confusion. Initial encounter. EXAM: CT HEAD WITHOUT CONTRAST TECHNIQUE: Contiguous axial images were obtained from the base of the skull through the vertex without intravenous contrast. COMPARISON:  08/15/2003 MR.  No comparison CT. FINDINGS: Exam is slightly motion degraded. No intracranial hemorrhage. Small vessel disease type changes without CT evidence of large acute infarct. Atrophy without hydrocephalus. No intracranial mass lesion noted on this unenhanced exam. Vascular calcifications. No intracranial mass lesion noted on this unenhanced exam.  IMPRESSION: Exam is slightly motion degraded. No intracranial hemorrhage. Small vessel disease type changes without CT evidence of large acute infarct. Atrophy without hydrocephalus. Electronically Signed   By: Chauncey Cruel M.D.   On: 03/14/2014 13:10   Dg Chest Port 1 View  Result Date: 03/14/2014 CLINICAL DATA:  Altered mental status, hypoglycemia. COPD, diabetic. EXAM: PORTABLE CHEST - 1 VIEW COMPARISON:  11/12/2012 . FINDINGS: Mild cardiomegaly. Diffuse interstitial prominence throughout the lungs has increased since prior study. This could reflect mild interstitial edema. No confluent opacities or effusions. No acute bony abnormality. IMPRESSION: Mild diffuse interstitial prominence throughout the lungs, question interstitial edema. Electronically Signed   By: Rolm Baptise M.D.   On: 03/14/2014 12:48   Ct Cta Abd/pel W/cm &/or W/o Cm  Result Date: 03/14/2014 CLINICAL DATA:  Abdominal pain. Elevated lactate. Rule out mesenteric ischemia. EXAM: CTA ABDOMEN AND PELVIS wITHOUT AND WITH CONTRAST TECHNIQUE: Multidetector CT imaging of the abdomen and pelvis was performed using the standard protocol during bolus administration of intravenous contrast. Multiplanar reconstructed images and MIPs were obtained and reviewed to evaluate the vascular anatomy. CONTRAST:  53m OMNIPAQUE IOHEXOL 350 MG/ML SOLN COMPARISON:  None. FINDINGS: The aorta is heavily calcified. Superior mesenteric artery and celiac artery are widely patent without stenosis. Inferior mesenteric artery is stenotic at its origin but patent. Single left renal  arteries widely patent. 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 The right renal artery arises from the aorta at the bifurcation without visible stenosis. Calcifications within the liver and spleen compatible with old granulomatous disease. Gallbladder, pancreas, adrenals and left kidney are unremarkable. Again, right kidney is within the pelvis. Areas of cortical loss and  scarring within the right kidney. Appendix is visualized and is normal. Stomach and small bowel decompressed, grossly unremarkable. The rectum is dilated and stool filled. There is mild rectal wall thickening posteriorly and significant edema/ stranding in the presacral space and around the lateral walls of the rectum. Findings suggestive of proctitis. Probable fecal impaction as well. No free fluid, free air or adenopathy. Prior hysterectomy. Urinary bladder is decompressed with Foley catheter in place. No acute bony abnormality or focal bone lesion. Cardiomegaly. Calcifications within the posterior pericardium compatible with calcific pericarditis. Left lower lobe atelectasis or early infiltrate. Right lung is clear. No effusions. Review of the MIP images confirms the above findings. IMPRESSION: Dilated stool-filled rectum suggesting fecal impaction. There is also mild wall thickening in the rectum and stranding in the perirectal soft tissues. Findings suggestive of proctitis. Right pelvic kidney. No evidence of bowel ischemia. Old granulomas disease in the liver and spleen. Calcific pericarditis.  Cardiomegaly. Left lower lobe atelectasis or infiltrate. Electronically Signed   By: Rolm Baptise M.D.   On: 03/14/2014 15:46    Assessment & Plan:   Liz was seen today for nail problem.  Diagnoses and all orders for this visit:  Uncontrolled type 2 diabetes mellitus with other circulatory complication, with long-term current use of insulin (HCC) -     TSH + free T4 -     CMP14+EGFR -     Bayer DCA Hb A1c Waived -     Ambulatory referral to Podiatry  Onychomycosis due to dermatophyte -     Ambulatory referral to Podiatry     I am having Ms. Diss maintain her multivitamin, acetaminophen, aspirin, DSS, NITROSTAT, ciclopirox, mometasone, Calcium Carb-Cholecalciferol, ACCU-CHEK FASTCLIX LANCETS, ACCU-CHEK SMARTVIEW, ALPRAZolam, Insulin Glargine (LANTUS SOLOSTAR [place]), insulin aspart, levothyroxine,  lisinopril, PROLIA, furosemide, diclofenac, cyclobenzaprine, bisacodyl, polyethylene glycol powder, B-D SINGLE USE SWABS REGULAR, TRUE METRIX AIR GLUCOSE METER, metoprolol tartrate, and potassium chloride SA.  No orders of the defined types were placed in this encounter.    Follow-up: Return in about 3 months (around 07/24/2016).  Claretta Fraise, M.D.

## 2016-04-26 LAB — CMP14+EGFR
ALBUMIN: 4.3 g/dL (ref 3.5–4.8)
ALT: 24 IU/L (ref 0–32)
AST: 28 IU/L (ref 0–40)
Albumin/Globulin Ratio: 1.7 (ref 1.2–2.2)
Alkaline Phosphatase: 67 IU/L (ref 39–117)
BILIRUBIN TOTAL: 0.7 mg/dL (ref 0.0–1.2)
BUN / CREAT RATIO: 37 — AB (ref 12–28)
BUN: 47 mg/dL — AB (ref 8–27)
CHLORIDE: 96 mmol/L (ref 96–106)
CO2: 23 mmol/L (ref 18–29)
CREATININE: 1.26 mg/dL — AB (ref 0.57–1.00)
Calcium: 8.7 mg/dL (ref 8.7–10.3)
GFR calc non Af Amer: 42 mL/min/{1.73_m2} — ABNORMAL LOW (ref >59)
GFR, EST AFRICAN AMERICAN: 48 mL/min/{1.73_m2} — AB (ref >59)
GLOBULIN, TOTAL: 2.6 g/dL (ref 1.5–4.5)
GLUCOSE: 233 mg/dL — AB (ref 65–99)
Potassium: 4.6 mmol/L (ref 3.5–5.2)
SODIUM: 139 mmol/L (ref 134–144)
TOTAL PROTEIN: 6.9 g/dL (ref 6.0–8.5)

## 2016-04-26 LAB — TSH+FREE T4
Free T4: 1.9 ng/dL — ABNORMAL HIGH (ref 0.82–1.77)
TSH: 2.34 u[IU]/mL (ref 0.450–4.500)

## 2016-05-02 ENCOUNTER — Ambulatory Visit: Payer: Commercial Managed Care - HMO | Admitting: "Endocrinology

## 2016-05-15 DIAGNOSIS — E1142 Type 2 diabetes mellitus with diabetic polyneuropathy: Secondary | ICD-10-CM | POA: Diagnosis not present

## 2016-05-15 DIAGNOSIS — B351 Tinea unguium: Secondary | ICD-10-CM | POA: Diagnosis not present

## 2016-05-15 DIAGNOSIS — L84 Corns and callosities: Secondary | ICD-10-CM | POA: Diagnosis not present

## 2016-06-04 ENCOUNTER — Encounter: Payer: Self-pay | Admitting: Family Medicine

## 2016-06-04 ENCOUNTER — Encounter: Payer: Self-pay | Admitting: *Deleted

## 2016-06-04 ENCOUNTER — Ambulatory Visit (INDEPENDENT_AMBULATORY_CARE_PROVIDER_SITE_OTHER): Payer: Medicare HMO | Admitting: Family Medicine

## 2016-06-04 VITALS — BP 161/64 | HR 60 | Temp 97.0°F | Ht 59.0 in | Wt 119.0 lb

## 2016-06-04 DIAGNOSIS — E785 Hyperlipidemia, unspecified: Secondary | ICD-10-CM

## 2016-06-04 DIAGNOSIS — IMO0002 Reserved for concepts with insufficient information to code with codable children: Secondary | ICD-10-CM

## 2016-06-04 DIAGNOSIS — I10 Essential (primary) hypertension: Secondary | ICD-10-CM

## 2016-06-04 DIAGNOSIS — E1165 Type 2 diabetes mellitus with hyperglycemia: Secondary | ICD-10-CM | POA: Diagnosis not present

## 2016-06-04 DIAGNOSIS — Z1231 Encounter for screening mammogram for malignant neoplasm of breast: Secondary | ICD-10-CM | POA: Diagnosis not present

## 2016-06-04 DIAGNOSIS — Z794 Long term (current) use of insulin: Secondary | ICD-10-CM | POA: Diagnosis not present

## 2016-06-04 DIAGNOSIS — E1159 Type 2 diabetes mellitus with other circulatory complications: Secondary | ICD-10-CM

## 2016-06-04 NOTE — Patient Instructions (Signed)
Remember to take her thyroid after your stomachs been empty for at least 2 hours. Then after taking the medicine wait at least 1 more hour before eating. The only thing you can have during that time is water .  Remember to take her blood pressure pill, metoprolol, twice a day every day. However, the lisinopril is once daily.

## 2016-06-04 NOTE — Progress Notes (Signed)
Subjective:  Patient ID: Ellen Woods, female    DOB: 07-11-1940  Age: 76 y.o. MRN: [id number]  CC: Diabetes (pt here today for routine follow up on her diabetes and needs refills on her xanax, levothyroxine and flonase.)   HPI Ellen Woods presents for Follow-up of diabetes. Patient does not check blood sugar at home Patient denies symptoms such as polyuria, polydipsia, excessive hunger, nausea No significant hypoglycemic spells noted. Medications as noted below. Taking them regularly without complication/adverse reaction being reported today.   Patient presents for follow-up on  thyroid. The patient has a history of hypothyroidism for many years. It has been stable recently. Pt. denies any change in  voice, loss of hair, heat or cold intolerance. Energy level has been adequate to good. Patient denies constipation and diarrhea. No myxedema. Medication is as noted below. Verified that pt is taking it daily on an empty stomach. Well tolerated.  Patient continues to do with anxiety on a regular basis. Inadequate relief with her Xanax. Has not increased her dose or taken unauthorized levels of medication.   History Ellen Woods has a past medical history of Arthritis; Back pain (1/14); COPD (chronic obstructive pulmonary disease) ([place]); Coronary atherosclerosis of native coronary artery; Encounter for long-term (current) use of insulin ([place]); Family history of anesthesia complication; PDA (patent ductus arteriosus); Pericardial calcification; Thyroid disease; Type II or unspecified type diabetes mellitus without mention of complication, not stated as uncontrolled; and Unspecified essential hypertension.   She has a past surgical history that includes Incisional hernia repair; Tubal ligation; Intramedullary (im) nail intertrochanteric (Left, 11/13/2012); and Abdominal hysterectomy.   Her family history includes Diabetes in her brother; Diabetes (age of onset: 78) in her brother; Diabetes (age of  onset: 60) in her son; Early death in her brother; Hypertension in her mother; Stroke in her brother and mother.She reports that she has never smoked. She has never used smokeless tobacco. She reports that she does not drink alcohol or use drugs.  Current Outpatient Prescriptions on File Prior to Visit  Medication Sig Dispense Refill  . ACCU-CHEK FASTCLIX LANCETS MISC USE FOR FINGERSTICK BLOOD SUGAR TESTING SIX TIMES DAILY 612 each 1  . ACCU-CHEK SMARTVIEW test strip USE FOR FINGERSTICK BLOOD SUGAR TESTING SIX TIMES DAILY 500 each 2  . acetaminophen (TYLENOL) 325 MG tablet Take 2 tablets (650 mg total) by mouth every 6 (six) hours as needed.    . Alcohol Swabs (B-D SINGLE USE SWABS REGULAR) PADS USE FOR FINGERSTICK BLOOD SUGAR TESTING SIX TIMES DAILY 600 each 2  . ALPRAZolam (XANAX) 0.5 MG tablet TAKE 1 TABLET TWICE DAILY AS NEEDED 180 tablet 0  . aspirin 81 MG tablet Take 81 mg by mouth daily.    . bisacodyl (DULCOLAX) 5 MG EC tablet Take 5 mg by mouth as needed for moderate constipation.    . Blood Glucose Monitoring Suppl (TRUE METRIX AIR GLUCOSE METER) w/Device KIT TEST FOUR TIMES DAILY 1 kit 0  . Calcium Carb-Cholecalciferol (CALCIUM 500 + D3) 500-600 MG-UNIT TABS Take 1 tablet by mouth daily. 60 tablet   . cyclobenzaprine (FLEXERIL) 10 MG tablet Take 1 tablet (10 mg total) by mouth 3 (three) times daily as needed for muscle spasms. 90 tablet 1  . diclofenac (VOLTAREN) 75 MG EC tablet Take 1 tablet (75 mg total) by mouth 2 (two) times daily. For muscle and  Joint pain 60 tablet 2  . docusate sodium 100 MG CAPS Take 100 mg by mouth 2 (two) times daily. Ellen Woods  capsule 0  . furosemide (LASIX) 40 MG tablet Take 40 mg by mouth daily.     . insulin aspart (NOVOLOG) 100 UNIT/ML FlexPen Inject 2-4 Units into the skin 3 (three) times daily with meals. 5 pen 3  . Insulin Glargine (LANTUS SOLOSTAR [place]) Inject 14 Units into the skin daily with breakfast.    . levothyroxine (SYNTHROID, LEVOTHROID) 50 MCG tablet  Take 1 tablet (50 mcg total) by mouth daily before breakfast. 90 tablet 0  . metoprolol tartrate (LOPRESSOR) 25 MG tablet TAKE 1 TABLET TWICE DAILY 180 tablet 1  . mometasone (NASONEX) 50 MCG/ACT nasal spray Place 2 sprays into the nose daily. 17 g 12  . Multiple Vitamin (MULTIVITAMIN) tablet Take 1 tablet by mouth daily.    Marland Kitchen NITROSTAT 0.4 MG SL tablet Place 1 tablet (0.4 mg total) under the tongue every 5 (five) minutes as needed for chest pain. 20 tablet 0  . polyethylene glycol powder (GLYCOLAX/MIRALAX) powder Mix 1 capsul (=17grams) with beverage of your choice.  Drink entire mixture once a day. 1734 g 1  . potassium chloride SA (K-DUR,KLOR-CON) 20 MEQ tablet TAKE 1 TABLET EVERY DAY 90 tablet 1  . PROLIA 60 MG/ML SOLN injection      No current facility-administered medications on file prior to visit.     ROS Review of Systems  Constitutional: Negative for activity change, appetite change and fever.  HENT: Negative for congestion, rhinorrhea and sore throat.   Eyes: Negative for visual disturbance.  Respiratory: Negative for cough and shortness of breath.   Cardiovascular: Negative for chest pain and palpitations.  Gastrointestinal: Negative for abdominal pain, diarrhea and nausea.  Genitourinary: Negative for dysuria.  Musculoskeletal: Negative for arthralgias and myalgias.    Objective:  BP (!) 161/64   Pulse 60   Temp 97 F (36.1 C) (Oral)   Ht 4' 11"  (1.499 m)   Wt 119 lb (54 kg)   BMI 24.04 kg/m   Physical Exam  Constitutional: She is oriented to person, place, and time. She appears well-developed and well-nourished. No distress.  HENT:  Head: Normocephalic and atraumatic.  Right Ear: External ear normal.  Left Ear: External ear normal.  Nose: Nose normal.  Mouth/Throat: Oropharynx is clear and moist.  Eyes: Conjunctivae and EOM are normal. Pupils are equal, round, and reactive to light.  Neck: Normal range of motion. Neck supple. No thyromegaly present.    Cardiovascular: Normal rate, regular rhythm and normal heart sounds.   No murmur heard. Pulmonary/Chest: Effort normal and breath sounds normal. No respiratory distress. She has no wheezes. She has no rales.  Abdominal: Soft. Bowel sounds are normal. She exhibits no distension. There is no tenderness.  Lymphadenopathy:    She has no cervical adenopathy.  Neurological: She is alert and oriented to person, place, and time. She has normal reflexes.  Skin: Skin is warm and dry.  Psychiatric: She has a normal mood and affect. Her behavior is normal. Judgment and thought content normal.    Assessment & Plan:   Reniyah was seen today for diabetes.  Diagnoses and all orders for this visit:  Uncontrolled type 2 diabetes mellitus with other circulatory complication, with long-term current use of insulin ([place])  Hyperlipidemia with target LDL less than 100  Essential hypertension, benign   I have discontinued Ms. Casher's ciclopirox. I am also having her maintain her multivitamin, acetaminophen, aspirin, DSS, NITROSTAT, mometasone, Calcium Carb-Cholecalciferol, ACCU-CHEK FASTCLIX LANCETS, ACCU-CHEK SMARTVIEW, ALPRAZolam, Insulin Glargine (LANTUS SOLOSTAR [place]), insulin aspart, levothyroxine,  PROLIA, furosemide, diclofenac, cyclobenzaprine, bisacodyl, polyethylene glycol powder, B-D SINGLE USE SWABS REGULAR, TRUE METRIX AIR GLUCOSE METER, metoprolol tartrate, potassium chloride SA, and ketoconazole.  Meds ordered this encounter  Medications  . ketoconazole (NIZORAL) 2 % cream     Follow-up: Return in about 3 months (around 09/01/2016).  Claretta Fraise, M.D.

## 2016-06-06 ENCOUNTER — Other Ambulatory Visit: Payer: Self-pay | Admitting: *Deleted

## 2016-06-06 DIAGNOSIS — I10 Essential (primary) hypertension: Secondary | ICD-10-CM

## 2016-06-06 MED ORDER — LISINOPRIL 20 MG PO TABS: 20.0000 mg | ORAL_TABLET | Freq: Every day | ORAL | 1 refills | Status: DC

## 2016-06-10 ENCOUNTER — Ambulatory Visit (INDEPENDENT_AMBULATORY_CARE_PROVIDER_SITE_OTHER): Payer: Medicare HMO | Admitting: "Endocrinology

## 2016-06-10 ENCOUNTER — Other Ambulatory Visit: Payer: Self-pay

## 2016-06-10 ENCOUNTER — Encounter: Payer: Self-pay | Admitting: "Endocrinology

## 2016-06-10 VITALS — BP 129/71 | HR 69 | Ht 59.0 in | Wt 116.0 lb

## 2016-06-10 DIAGNOSIS — IMO0002 Reserved for concepts with insufficient information to code with codable children: Secondary | ICD-10-CM

## 2016-06-10 DIAGNOSIS — E1159 Type 2 diabetes mellitus with other circulatory complications: Secondary | ICD-10-CM | POA: Diagnosis not present

## 2016-06-10 DIAGNOSIS — E038 Other specified hypothyroidism: Secondary | ICD-10-CM

## 2016-06-10 DIAGNOSIS — E1165 Type 2 diabetes mellitus with hyperglycemia: Secondary | ICD-10-CM

## 2016-06-10 DIAGNOSIS — Z794 Long term (current) use of insulin: Secondary | ICD-10-CM

## 2016-06-10 DIAGNOSIS — I10 Essential (primary) hypertension: Secondary | ICD-10-CM | POA: Diagnosis not present

## 2016-06-10 MED ORDER — LEVOTHYROXINE SODIUM 25 MCG PO TABS: 25.0000 ug | ORAL_TABLET | Freq: Every day | ORAL | 1 refills | Status: DC

## 2016-06-10 MED ORDER — LEVOTHYROXINE SODIUM 25 MCG PO TABS: 25.0000 ug | ORAL_TABLET | Freq: Every day | ORAL | 3 refills | Status: DC

## 2016-06-10 MED ORDER — INSULIN GLARGINE 100 UNIT/ML SOLOSTAR PEN: 12.0000 [IU] | PEN_INJECTOR | Freq: Every day | SUBCUTANEOUS | 2 refills | Status: DC

## 2016-06-10 NOTE — Progress Notes (Signed)
Subjective:    Patient ID: Ellen Woods, female    DOB: 1940/09/01. Patient is being seen in f/u for management of diabetes requested by  Claretta Fraise, MD  Past Medical History:  Diagnosis Date  . Arthritis   . Back pain 1/14   Tx by orthopedics  . COPD (chronic obstructive pulmonary disease) ([place])   . Coronary atherosclerosis of native coronary artery    50% LAD stenosis  . Encounter for long-term (current) use of insulin ([place])   . Family history of anesthesia complication    Daughter has nausea  . PDA (patent ductus arteriosus)    Small PDA documented by CT angiography, no pulmonary hypertension  . Pericardial calcification    Ruled out for restrictive pericarditis  . Thyroid disease   . Type II or unspecified type diabetes mellitus without mention of complication, not stated as uncontrolled   . Unspecified essential hypertension    Past Surgical History:  Procedure Laterality Date  . ABDOMINAL HYSTERECTOMY     partial  . INCISIONAL HERNIA REPAIR    . INTRAMEDULLARY (IM) NAIL INTERTROCHANTERIC Left 11/13/2012   Procedure: INTRAMEDULLARY (IM) NAIL INTERTROCHANTRIC LEFT HIP;  Surgeon: Hessie Dibble, MD;  Location: [facility];  Service: Orthopedics;  Laterality: Left;  . TUBAL LIGATION     Social History   Social History  . Marital status: Widowed    Spouse name: N/A  . Number of children: 2  . Years of education: N/A   Social History Main Topics  . Smoking status: Never Smoker  . Smokeless tobacco: Never Used  . Alcohol use No  . Drug use: No  . Sexual activity: No   Other Topics Concern  . None   Social History Narrative  . None   Outpatient Encounter Prescriptions as of 06/10/2016  Medication Sig  . ACCU-CHEK FASTCLIX LANCETS MISC USE FOR FINGERSTICK BLOOD SUGAR TESTING SIX TIMES DAILY  . ACCU-CHEK SMARTVIEW test strip USE FOR FINGERSTICK BLOOD SUGAR TESTING SIX TIMES DAILY  . acetaminophen (TYLENOL) 325 MG tablet Take 2 tablets (650 mg total) by mouth  every 6 (six) hours as needed.  . Alcohol Swabs (B-D SINGLE USE SWABS REGULAR) PADS USE FOR FINGERSTICK BLOOD SUGAR TESTING SIX TIMES DAILY  . ALPRAZolam (XANAX) 0.5 MG tablet TAKE 1 TABLET TWICE DAILY AS NEEDED  . aspirin 81 MG tablet Take 81 mg by mouth daily.  . bisacodyl (DULCOLAX) 5 MG EC tablet Take 5 mg by mouth as needed for moderate constipation.  . Blood Glucose Monitoring Suppl (TRUE METRIX AIR GLUCOSE METER) w/Device KIT TEST FOUR TIMES DAILY  . Calcium Carb-Cholecalciferol (CALCIUM 500 + D3) 500-600 MG-UNIT TABS Take 1 tablet by mouth daily.  . cyclobenzaprine (FLEXERIL) 10 MG tablet Take 1 tablet (10 mg total) by mouth 3 (three) times daily as needed for muscle spasms.  . diclofenac (VOLTAREN) 75 MG EC tablet Take 1 tablet (75 mg total) by mouth 2 (two) times daily. For muscle and  Joint pain  . docusate sodium 100 MG CAPS Take 100 mg by mouth 2 (two) times daily.  . furosemide (LASIX) 40 MG tablet Take 40 mg by mouth daily.   . insulin aspart (NOVOLOG) 100 UNIT/ML FlexPen Inject 2-4 Units into the skin 3 (three) times daily with meals.  . Insulin Glargine (LANTUS SOLOSTAR [place]) Inject 12 Units into the skin daily with breakfast.  . ketoconazole (NIZORAL) 2 % cream   . levothyroxine (SYNTHROID, LEVOTHROID) 50 MCG tablet Take 1 tablet (50 mcg  total) by mouth daily before breakfast.  . lisinopril (PRINIVIL,ZESTRIL) 20 MG tablet Take 1 tablet (20 mg total) by mouth daily.  . metoprolol tartrate (LOPRESSOR) 25 MG tablet TAKE 1 TABLET TWICE DAILY  . mometasone (NASONEX) 50 MCG/ACT nasal spray Place 2 sprays into the nose daily.  . Multiple Vitamin (MULTIVITAMIN) tablet Take 1 tablet by mouth daily.  Marland Kitchen NITROSTAT 0.4 MG SL tablet Place 1 tablet (0.4 mg total) under the tongue every 5 (five) minutes as needed for chest pain.  . polyethylene glycol powder (GLYCOLAX/MIRALAX) powder Mix 1 capsul (=17grams) with beverage of your choice.  Drink entire mixture once a day.  . potassium chloride SA  (K-DUR,KLOR-CON) 20 MEQ tablet TAKE 1 TABLET EVERY DAY  . PROLIA 60 MG/ML SOLN injection    No facility-administered encounter medications on file as of 06/10/2016.    ALLERGIES: Allergies  Allergen Reactions  . Azithromycin Other (See Comments)    Hospital reaction  . Codeine     REACTION: nausea  . Invokana [Canagliflozin]    VACCINATION STATUS: Immunization History  Administered Date(s) Administered  . Influenza, High Dose Seasonal PF 02/21/2016  . Influenza,inj,Quad PF,36+ Mos 03/02/2014, 02/23/2015  . Influenza-Unspecified 02/21/2013  . Pneumococcal Conjugate-13 03/02/2014  . Zoster 09/02/2013    Diabetes  She presents for her follow-up diabetic visit. She has type 2 diabetes mellitus. Onset time: She was diagnosed at approximate age of 42 years. Her disease course has been improving. There are no hypoglycemic associated symptoms. Pertinent negatives for hypoglycemia include no confusion, headaches, pallor or seizures. There are no diabetic associated symptoms. Pertinent negatives for diabetes include no chest pain and no polyphagia. There are no hypoglycemic complications. Symptoms are improving. Diabetic complications include heart disease and PVD. Risk factors for coronary artery disease include diabetes mellitus and hypertension. Current diabetic treatment includes insulin injections (She is taking 16 units of Lantus in the morning and NovoLog 3-10 units with meals.). Her weight is stable. She is following a generally unhealthy diet. When asked about meal planning, she reported none. She has not had a previous visit with a dietitian. She participates in exercise intermittently. Her home blood glucose trend is fluctuating minimally (Her logs show that she has less frequent hypoglycemia than before, however she made insulin dosing errors. She is accompanied by her daughter this morning was offering to help.). Her breakfast blood glucose range is generally 130-140 mg/dl. Her lunch blood  glucose range is generally 130-140 mg/dl. Her dinner blood glucose range is generally 130-140 mg/dl. Her overall blood glucose range is 130-140 mg/dl. An ACE inhibitor/angiotensin II receptor blocker is being taken. Eye exam is current.  Hypertension  This is a chronic problem. The current episode started more than 1 year ago. The problem is controlled. Pertinent negatives include no chest pain, headaches, palpitations or shortness of breath. Risk factors for coronary artery disease include diabetes mellitus. Past treatments include beta blockers. Hypertensive end-organ damage includes PVD.     Review of Systems  Constitutional: Negative for unexpected weight change.  HENT: Negative for trouble swallowing and voice change.   Eyes: Negative for visual disturbance.  Respiratory: Negative for cough, shortness of breath and wheezing.   Cardiovascular: Negative for chest pain, palpitations and leg swelling.  Gastrointestinal: Negative for diarrhea, nausea and vomiting.  Endocrine: Negative for cold intolerance, heat intolerance and polyphagia.  Genitourinary: Positive for frequency. Negative for dysuria and flank pain.  Musculoskeletal: Negative for arthralgias and myalgias.  Skin: Negative for color change, pallor, rash and  wound.  Neurological: Negative for seizures and headaches.  Psychiatric/Behavioral: Negative for confusion and suicidal ideas.    Objective:    BP 129/71   Pulse 69   Ht 4' 11"  (1.499 m)   Wt 116 lb (52.6 kg)   BMI 23.43 kg/m   Wt Readings from Last 3 Encounters:  06/10/16 116 lb (52.6 kg)  06/04/16 119 lb (54 kg)  04/25/16 119 lb (54 kg)    Physical Exam  Constitutional: She is oriented to person, place, and time. She appears well-developed.  HENT:  Head: Normocephalic and atraumatic.  Eyes: EOM are normal.  Neck: Normal range of motion. Neck supple. No tracheal deviation present. No thyromegaly present.  Cardiovascular: Normal rate and regular rhythm.    Pulmonary/Chest: Effort normal and breath sounds normal.  Abdominal: Soft. Bowel sounds are normal. There is no tenderness. There is no guarding.  Musculoskeletal: Normal range of motion. She exhibits no edema.  Neurological: She is alert and oriented to person, place, and time. She has normal reflexes. No cranial nerve deficit. Coordination normal.  Skin: Skin is warm and dry. No rash noted. No erythema. No pallor.  Psychiatric: She has a normal mood and affect. Judgment normal.    CMP     Component Value Date/Time   NA 139 04/25/2016 0839   K 4.6 04/25/2016 0839   CL 96 04/25/2016 0839   CO2 23 04/25/2016 0839   GLUCOSE 233 (H) 04/25/2016 0839   GLUCOSE 134 (H) 03/17/2014 0440   BUN 47 (H) 04/25/2016 0839   CREATININE 1.26 (H) 04/25/2016 0839   CREATININE 0.99 10/25/2012 0952   CALCIUM 8.7 04/25/2016 0839   PROT 6.9 04/25/2016 0839   ALBUMIN 4.3 04/25/2016 0839   AST 28 04/25/2016 0839   ALT 24 04/25/2016 0839   ALKPHOS 67 04/25/2016 0839   BILITOT 0.7 04/25/2016 0839   GFRNONAA 42 (L) 04/25/2016 0839   GFRNONAA 58 (L) 10/25/2012 0952   GFRAA 48 (L) 04/25/2016 0839   GFRAA 66 10/25/2012 0952    Diabetic Labs (most recent): Lab Results  Component Value Date   HGBA1C 7 04/25/2016   HGBA1C 6.8 (H) 01/18/2016   HGBA1C 8.2 05/29/2015     Lipid Panel ( most recent) Lipid Panel     Component Value Date/Time   CHOL 226 (H) 05/29/2015 1125   TRIG 102 05/29/2015 1125   TRIG 148 07/21/2014 1305   HDL 74 05/29/2015 1125   HDL 73 07/21/2014 1305   CHOLHDL 3.1 05/29/2015 1125   CHOLHDL 3.2 10/25/2012 0952   VLDL 18 10/25/2012 0952   LDLCALC 132 (H) 05/29/2015 1125      Assessment & Plan:   1. Uncontrolled type 2 diabetes mellitus with complication, with long-term current use of insulin ([place])  - Patient has currently uncontrolled symptomatic type 2 DM since  76 years of age. She Came with slightly better however still fluctuating blood glucose profile. She does have  regular random hypoglycemia, mainly associated with inadequate oral intake or misdemeanors. Her recent A1c of  7 % improving from 8.2 %. Recent labs reviewed.    her diabetes is complicated by coronary artery disease and peripheral arterial disease and patient remains at a high risk for more acute and chronic complications of diabetes which include CAD, CVA, CKD, retinopathy, and neuropathy. These are all discussed in detail with the patient.  - I have counseled the patient on diet management  by adopting a carbohydrate restricted/protein rich diet.  - Suggestion is made  for patient to avoid simple carbohydrates   from their diet including Cakes , Desserts, Ice Cream,  Soda (  diet and regular) , Sweet Tea , Candies,  Chips, Cookies, Artificial Sweeteners,   and "Sugar-free" Products . This will help patient to have stable blood glucose profile and potentially avoid unintended weight gain.  - I encouraged the patient to switch to  unprocessed or minimally processed complex starch and increased protein intake (animal or plant source), fruits, and vegetables.  - Patient is advised to stick to a routine mealtimes to eat 3 meals  a day and avoid unnecessary snacks ( to snack only to correct hypoglycemia).  - The patient will be scheduled with Jearld Fenton, RDN, CDE for individualized DM education.  - I have approached patient with the following individualized plan to manage diabetes and patient agrees:   - The primary objective in the care of this patient is to avoid hypoglycemia. - She has struggled to coordinate her meals with her insulin timing.  - I will lower her basal insulin Lantus to 12 units  every morning at 8 AM with breakfast, continue  her prandial insulin NovoLog to 2-4 units 3 times a day before meals  for pre-meal BG readings of 90-143m/dl, plus patient specific correction dose for unexpected hyperglycemia above 1554mdl, associated with strict monitoring of glucose  AC and HS. -  Patient is warned not to take insulin without proper monitoring per orders. -Adjustment parameters are given for hypo and hyperglycemia in writing. -Patient is encouraged to call clinic for blood glucose levels less than 70 or above 300 mg /dl. -She reports that she is getting the help from her daughter was not accompanying her today.  -  She is not a good candidate for SGLT2 I,  Incretin therapy, but will be considered for low-dose metformin if she continues to have normal renal function.   - Patient specific target  A1c;  LDL, HDL, Triglycerides, and  Waist Circumference were discussed in detail.  2) BP/HTN: Controlled. Continue current medications including ACEI/ARB. 3) Lipids/HPL:  Uncontrolled, LDL 132,  she will be considered for  statins. 4)  Weight/Diet: CDE Consult will be initiated , exercise, and detailed carbohydrates information provided.  5) hypothyroidism: - Her labs are consistent with over replacement with levothyroxine.  -I will lower levothyroxine to 25 g by mouth every morning.  - We discussed about correct intake of levothyroxine, at fasting, with water, separated by at least 30 minutes from breakfast, and separated by more than 4 hours from calcium, iron, multivitamins, acid reflux medications (PPIs). -Patient is made aware of the fact that thyroid hormone replacement is needed for life, dose to be adjusted by periodic monitoring of thyroid function tests.  6) Chronic Care/Health Maintenance:  - She is not on ACEI/ARB nor Statin medications, encouraged to continue to follow up with Ophthalmology, Podiatrist at least yearly or according to recommendations, and advised to   stay away from smoking. I have recommended yearly flu vaccine and pneumonia vaccination at least every 5 years; moderate intensity exercise for up to 150 minutes weekly; and  sleep for at least 7 hours a day.  - 25 minutes of time was spent on the care of this patient , 50% of which was applied for  counseling on diabetes complications and their preventions.  - Patient to bring meter and  blood glucose logs during their next visit.   - I advised patient to maintain close follow up with STClaretta FraiseMD for  primary care needs.  Follow up plan: - Return in about 3 months (around 09/07/2016) for follow up with pre-visit labs, meter, and logs.  Glade Lloyd, MD Phone: [phone]  Fax: [phone]   06/10/2016, 2:18 PM

## 2016-06-23 ENCOUNTER — Other Ambulatory Visit: Payer: Self-pay | Admitting: Family Medicine

## 2016-07-10 ENCOUNTER — Other Ambulatory Visit: Payer: Self-pay | Admitting: "Endocrinology

## 2016-07-23 ENCOUNTER — Other Ambulatory Visit: Payer: Self-pay | Admitting: Family Medicine

## 2016-07-23 ENCOUNTER — Other Ambulatory Visit: Payer: Self-pay | Admitting: "Endocrinology

## 2016-07-24 ENCOUNTER — Telehealth: Payer: Self-pay | Admitting: Cardiology

## 2016-07-24 NOTE — Telephone Encounter (Signed)
[facility]  [facility]

## 2016-07-25 ENCOUNTER — Other Ambulatory Visit: Payer: Self-pay | Admitting: Pharmacist

## 2016-07-25 MED ORDER — PROLIA 60 MG/ML ~~LOC~~ SOLN: 60.0000 mg | SUBCUTANEOUS | 0 refills | Status: AC

## 2016-08-04 ENCOUNTER — Ambulatory Visit (INDEPENDENT_AMBULATORY_CARE_PROVIDER_SITE_OTHER): Payer: Medicare HMO | Admitting: Family Medicine

## 2016-08-04 ENCOUNTER — Encounter: Payer: Self-pay | Admitting: Family Medicine

## 2016-08-04 VITALS — BP 142/64 | HR 70 | Temp 97.4°F | Ht 59.0 in | Wt 119.0 lb

## 2016-08-04 DIAGNOSIS — E11649 Type 2 diabetes mellitus with hypoglycemia without coma: Secondary | ICD-10-CM | POA: Diagnosis not present

## 2016-08-04 DIAGNOSIS — I10 Essential (primary) hypertension: Secondary | ICD-10-CM

## 2016-08-04 DIAGNOSIS — F411 Generalized anxiety disorder: Secondary | ICD-10-CM

## 2016-08-04 DIAGNOSIS — B351 Tinea unguium: Secondary | ICD-10-CM

## 2016-08-04 DIAGNOSIS — E785 Hyperlipidemia, unspecified: Secondary | ICD-10-CM | POA: Diagnosis not present

## 2016-08-04 MED ORDER — ALPRAZOLAM 0.5 MG PO TABS: 0.5000 mg | ORAL_TABLET | Freq: Two times a day (BID) | ORAL | 0 refills | Status: DC | PRN

## 2016-08-04 NOTE — Progress Notes (Signed)
Subjective:  Patient ID: Ellen Woods, female    DOB: 31-Jul-1940  Age: 76 y.o. MRN: [id number]  CC: Diabetes (pt here today for routine follow up on her diabetes, also c/o toe fungus)   HPI Ellen Woods presents forFollow-up of diabetes. Patient checks blood sugar at home.  60-90 fasting and as high as 300 postprandial Patient denies symptoms such as polyuria, polydipsia, excessive hunger, nausea Vague hypoglycemic spells noted. Medications reviewed. Pt reports taking them regularly without complication/adverse reaction being reported today.  Checking feet daily. Patient also is seeing endocrinology. She has an appointment with him in about 6 weeks.   Patient relates that her toenail is throbbing. However she does not want to take medication for this. She has seen a podiatrist. She is reluctant to go back. She does not explain why. .PHQ History Ellen Woods has a past medical history of Arthritis; Back pain (1/14); COPD (chronic obstructive pulmonary disease) ([place]); Coronary atherosclerosis of native coronary artery; Encounter for long-term (current) use of insulin ([place]); Family history of anesthesia complication; PDA (patent ductus arteriosus); Pericardial calcification; Thyroid disease; Type II or unspecified type diabetes mellitus without mention of complication, not stated as uncontrolled; and Unspecified essential hypertension.   She has a past surgical history that includes Incisional hernia repair; Tubal ligation; Intramedullary (im) nail intertrochanteric (Left, 11/13/2012); and Abdominal hysterectomy.   Her family history includes Diabetes in her brother; Diabetes (age of onset: 71) in her brother; Diabetes (age of onset: 50) in her son; Early death in her brother; Hypertension in her mother; Stroke in her brother and mother.She reports that she has never smoked. She has never used smokeless tobacco. She reports that she does not drink alcohol or use drugs.  Current Outpatient  Prescriptions on File Prior to Visit  Medication Sig Dispense Refill  . acetaminophen (TYLENOL) 325 MG tablet Take 2 tablets (650 mg total) by mouth every 6 (six) hours as needed.    . Alcohol Swabs (B-D SINGLE USE SWABS REGULAR) PADS USE FOR FINGERSTICK BLOOD SUGAR TESTING SIX TIMES DAILY 600 each 2  . aspirin 81 MG tablet Take 81 mg by mouth daily.    . Blood Glucose Monitoring Suppl (TRUE METRIX AIR GLUCOSE METER) w/Device KIT TEST FOUR TIMES DAILY 1 kit 0  . Calcium Carb-Cholecalciferol (CALCIUM 500 + D3) 500-600 MG-UNIT TABS Take 1 tablet by mouth daily. 60 tablet   . docusate sodium 100 MG CAPS Take 100 mg by mouth 2 (two) times daily. 60 capsule 0  . furosemide (LASIX) 40 MG tablet TAKE 1/2 TABLET EVERY DAY 45 tablet 2  . insulin aspart (NOVOLOG) 100 UNIT/ML FlexPen Inject 2-4 Units into the skin 3 (three) times daily with meals. 5 pen 3  . Insulin Glargine (LANTUS SOLOSTAR) 100 UNIT/ML Solostar Pen Inject 12 Units into the skin daily with breakfast. 5 pen 2  . ketoconazole (NIZORAL) 2 % cream     . levothyroxine (SYNTHROID, LEVOTHROID) 25 MCG tablet Take 1 tablet (25 mcg total) by mouth daily before breakfast. 90 tablet 1  . lisinopril (PRINIVIL,ZESTRIL) 20 MG tablet Take 1 tablet (20 mg total) by mouth daily. 90 tablet 1  . metoprolol tartrate (LOPRESSOR) 25 MG tablet TAKE 1 TABLET TWICE DAILY 180 tablet 1  . mometasone (NASONEX) 50 MCG/ACT nasal spray Place 2 sprays into the nose daily. 17 g 12  . Multiple Vitamin (MULTIVITAMIN) tablet Take 1 tablet by mouth daily.    Marland Kitchen NITROSTAT 0.4 MG SL tablet Place 1 tablet (0.4 mg  total) under the tongue every 5 (five) minutes as needed for chest pain. 20 tablet 0  . polyethylene glycol powder (GLYCOLAX/MIRALAX) powder Mix 1 capsul (=17grams) with beverage of your choice.  Drink entire mixture once a day. 1734 g 1  . potassium chloride SA (K-DUR,KLOR-CON) 20 MEQ tablet TAKE 1 TABLET EVERY DAY 90 tablet 1  . PROLIA 60 MG/ML SOLN injection Inject 60  mg into the skin every 6 (six) months. To be administered 08/20/2016 1 each 0  . TRUE METRIX BLOOD GLUCOSE TEST test strip TEST FOUR TIMES DAILY 400 each 2  . TRUEPLUS LANCETS 30G MISC TEST FOUR TIMES DAILY 400 each 2   No current facility-administered medications on file prior to visit.     ROS Review of Systems  Constitutional: Negative for activity change, appetite change and fever.  HENT: Negative for congestion, rhinorrhea and sore throat.   Eyes: Negative for visual disturbance.  Respiratory: Negative for cough and shortness of breath.   Cardiovascular: Negative for chest pain and palpitations.  Gastrointestinal: Negative for abdominal pain, diarrhea and nausea.  Endocrine: Negative for polydipsia, polyphagia and polyuria.  Genitourinary: Negative for dysuria.  Musculoskeletal: Negative for arthralgias and myalgias.    Objective:  BP (!) 172/58   Pulse 70   Temp 97.4 F (36.3 C) (Oral)   Ht _0  (1.499 m)   Wt 119 lb (54 kg)   BMI 24.04 kg/m   BP Readings from Last 3 Encounters:  08/04/16 (!) 172/58  06/10/16 129/71  06/04/16 (!) 161/64    Wt Readings from Last 3 Encounters:  08/04/16 119 lb (54 kg)  06/10/16 116 lb (52.6 kg)  06/04/16 119 lb (54 kg)     Physical Exam  Constitutional: She is oriented to person, place, and time. She appears well-developed and well-nourished. No distress.  HENT:  Head: Normocephalic and atraumatic.  Right Ear: External ear normal.  Left Ear: External ear normal.  Nose: Nose normal.  Mouth/Throat: Oropharynx is clear and moist.  Eyes: Conjunctivae and EOM are normal. Pupils are equal, round, and reactive to light.  Neck: Normal range of motion. Neck supple. No thyromegaly present.  Cardiovascular: Normal rate, regular rhythm and normal heart sounds.   No murmur heard. Pulmonary/Chest: Effort normal and breath sounds normal. No respiratory distress. She has no wheezes. She has no rales.  Abdominal: Soft. Bowel sounds are  normal. She exhibits no distension. There is no tenderness.  Lymphadenopathy:    She has no cervical adenopathy.  Neurological: She is alert and oriented to person, place, and time. She has normal reflexes.  Skin: Skin is warm and dry.  Psychiatric: She has a normal mood and affect. Her speech is normal and behavior is normal. Thought content normal. Cognition and memory are impaired. She expresses impulsivity.  Patient exhibits mild tangential thought with incomplete thoughts. Her answers do not address the question asked frequently.      Assessment & Plan:   Moya was seen today for diabetes.  Diagnoses and all orders for this visit:  Diabetic hypoglycemia ([place])  Hyperlipidemia with target LDL less than 100  Essential hypertension, benign  Onychomycosis due to dermatophyte  GAD (generalized anxiety disorder)  Other orders -     ALPRAZolam (XANAX) 0.5 MG tablet; Take 1 tablet (0.5 mg total) by mouth 2 (two) times daily as needed.      I have discontinued Ellen Woods's diclofenac, cyclobenzaprine, and bisacodyl. I have also changed her ALPRAZolam. Additionally, I am having her maintain  her multivitamin, acetaminophen, aspirin, DSS, NITROSTAT, mometasone, Calcium Carb-Cholecalciferol, insulin aspart, polyethylene glycol powder, B-D SINGLE USE SWABS REGULAR, TRUE METRIX AIR GLUCOSE METER, metoprolol tartrate, potassium chloride SA, ketoconazole, lisinopril, Insulin Glargine, levothyroxine, TRUE METRIX BLOOD GLUCOSE TEST, furosemide, TRUEPLUS LANCETS 30G, and PROLIA.  Meds ordered this encounter  Medications  . ALPRAZolam (XANAX) 0.5 MG tablet    Sig: Take 1 tablet (0.5 mg total) by mouth 2 (two) times daily as needed.    Dispense:  180 tablet    Refill:  0     Follow-up: Return in about 6 months (around 02/03/2017).  Ellen Woods, M.D.

## 2016-08-13 ENCOUNTER — Other Ambulatory Visit: Payer: Self-pay | Admitting: Nurse Practitioner

## 2016-08-13 DIAGNOSIS — I6523 Occlusion and stenosis of bilateral carotid arteries: Secondary | ICD-10-CM

## 2016-08-14 DIAGNOSIS — M79676 Pain in unspecified toe(s): Secondary | ICD-10-CM | POA: Diagnosis not present

## 2016-08-14 DIAGNOSIS — B351 Tinea unguium: Secondary | ICD-10-CM | POA: Diagnosis not present

## 2016-08-14 DIAGNOSIS — E1142 Type 2 diabetes mellitus with diabetic polyneuropathy: Secondary | ICD-10-CM | POA: Diagnosis not present

## 2016-08-14 DIAGNOSIS — L84 Corns and callosities: Secondary | ICD-10-CM | POA: Diagnosis not present

## 2016-08-27 ENCOUNTER — Other Ambulatory Visit: Payer: Self-pay | Admitting: Family Medicine

## 2016-08-28 ENCOUNTER — Other Ambulatory Visit: Payer: Self-pay | Admitting: Family Medicine

## 2016-09-03 ENCOUNTER — Other Ambulatory Visit: Payer: Medicare HMO

## 2016-09-03 ENCOUNTER — Ambulatory Visit: Payer: Medicare HMO

## 2016-09-03 DIAGNOSIS — E1165 Type 2 diabetes mellitus with hyperglycemia: Secondary | ICD-10-CM | POA: Diagnosis not present

## 2016-09-03 DIAGNOSIS — E1159 Type 2 diabetes mellitus with other circulatory complications: Secondary | ICD-10-CM | POA: Diagnosis not present

## 2016-09-03 DIAGNOSIS — I6523 Occlusion and stenosis of bilateral carotid arteries: Secondary | ICD-10-CM

## 2016-09-03 DIAGNOSIS — Z794 Long term (current) use of insulin: Secondary | ICD-10-CM | POA: Diagnosis not present

## 2016-09-03 LAB — VAS US CAROTID
LEFT ECA DIAS: 0 cm/s
LEFT VERTEBRAL DIAS: -27 cm/s
LICADSYS: -97 cm/s
LICAPSYS: 66 cm/s
Left CCA dist dias: -11 cm/s
Left CCA dist sys: -72 cm/s
Left CCA prox dias: 11 cm/s
Left CCA prox sys: 110 cm/s
Left ICA dist dias: -28 cm/s
Left ICA prox dias: 14 cm/s
RIGHT ECA DIAS: 0 cm/s
RIGHT VERTEBRAL DIAS: -7 cm/s
Right CCA prox dias: 14 cm/s
Right CCA prox sys: 84 cm/s
Right cca dist sys: -61 cm/s

## 2016-09-04 LAB — SPECIMEN STATUS

## 2016-09-05 LAB — HEMOGLOBIN A1C
Est. average glucose Bld gHb Est-mCnc: 186 mg/dL
HEMOGLOBIN A1C: 8.1 % — AB (ref 4.8–5.6)

## 2016-09-05 LAB — CMP14+EGFR
ALBUMIN: 4.2 g/dL (ref 3.5–4.8)
ALK PHOS: 94 IU/L (ref 39–117)
ALT: 24 IU/L (ref 0–32)
AST: 27 IU/L (ref 0–40)
Albumin/Globulin Ratio: 1.7 (ref 1.2–2.2)
BILIRUBIN TOTAL: 0.4 mg/dL (ref 0.0–1.2)
BUN / CREAT RATIO: 33 — AB (ref 12–28)
BUN: 39 mg/dL — ABNORMAL HIGH (ref 8–27)
CO2: 24 mmol/L (ref 18–29)
Calcium: 9.3 mg/dL (ref 8.7–10.3)
Chloride: 100 mmol/L (ref 96–106)
Creatinine, Ser: 1.18 mg/dL — ABNORMAL HIGH (ref 0.57–1.00)
GFR calc Af Amer: 52 mL/min/{1.73_m2} — ABNORMAL LOW (ref >59)
GFR calc non Af Amer: 45 mL/min/{1.73_m2} — ABNORMAL LOW (ref >59)
GLUCOSE: 194 mg/dL — AB (ref 65–99)
Globulin, Total: 2.5 g/dL (ref 1.5–4.5)
Potassium: 4.6 mmol/L (ref 3.5–5.2)
Sodium: 140 mmol/L (ref 134–144)
Total Protein: 6.7 g/dL (ref 6.0–8.5)

## 2016-09-05 LAB — SPECIMEN STATUS REPORT

## 2016-09-10 ENCOUNTER — Ambulatory Visit (INDEPENDENT_AMBULATORY_CARE_PROVIDER_SITE_OTHER): Payer: Medicare HMO | Admitting: "Endocrinology

## 2016-09-10 ENCOUNTER — Encounter: Payer: Self-pay | Admitting: "Endocrinology

## 2016-09-10 VITALS — BP 153/83 | HR 72 | Ht 59.0 in | Wt 122.0 lb

## 2016-09-10 DIAGNOSIS — Z794 Long term (current) use of insulin: Secondary | ICD-10-CM

## 2016-09-10 DIAGNOSIS — E038 Other specified hypothyroidism: Secondary | ICD-10-CM | POA: Diagnosis not present

## 2016-09-10 DIAGNOSIS — I10 Essential (primary) hypertension: Secondary | ICD-10-CM | POA: Diagnosis not present

## 2016-09-10 DIAGNOSIS — IMO0002 Reserved for concepts with insufficient information to code with codable children: Secondary | ICD-10-CM

## 2016-09-10 DIAGNOSIS — E1165 Type 2 diabetes mellitus with hyperglycemia: Secondary | ICD-10-CM

## 2016-09-10 DIAGNOSIS — E1159 Type 2 diabetes mellitus with other circulatory complications: Secondary | ICD-10-CM

## 2016-09-10 NOTE — Progress Notes (Signed)
Subjective:    Patient ID: Ellen Woods, female    DOB: [birth year]. Patient is being seen in f/u for management of diabetes requested by  Claretta Fraise, MD  Past Medical History:  Diagnosis Date  . Arthritis   . Back pain 1/14   Tx by orthopedics  . COPD (chronic obstructive pulmonary disease) ([facility])   . Coronary atherosclerosis of native coronary artery    50% LAD stenosis  . Encounter for long-term (current) use of insulin ([place])   . Family history of anesthesia complication    Daughter has nausea  . PDA (patent ductus arteriosus)    Small PDA documented by CT angiography, no pulmonary hypertension  . Pericardial calcification    Ruled out for restrictive pericarditis  . Thyroid disease   . Type II or unspecified type diabetes mellitus without mention of complication, not stated as uncontrolled   . Unspecified essential hypertension    Past Surgical History:  Procedure Laterality Date  . ABDOMINAL HYSTERECTOMY     partial  . INCISIONAL HERNIA REPAIR    . INTRAMEDULLARY (IM) NAIL INTERTROCHANTERIC Left 11/13/2012   Procedure: INTRAMEDULLARY (IM) NAIL INTERTROCHANTRIC LEFT HIP;  Surgeon: Hessie Dibble, MD;  Location: [facility];  Service: Orthopedics;  Laterality: Left;  . TUBAL LIGATION     Social History   Social History  . Marital status: Widowed    Spouse name: N/A  . Number of children: 2  . Years of education: N/A   Social History Main Topics  . Smoking status: Never Smoker  . Smokeless tobacco: Never Used  . Alcohol use No  . Drug use: No  . Sexual activity: No   Other Topics Concern  . None   Social History Narrative  . None   Outpatient Encounter Prescriptions as of 09/10/2016  Medication Sig  . acetaminophen (TYLENOL) 325 MG tablet Take 2 tablets (650 mg total) by mouth every 6 (six) hours as needed.  . Alcohol Swabs (B-D SINGLE USE SWABS REGULAR) PADS USE FOR FINGERSTICK BLOOD SUGAR TESTING SIX TIMES DAILY  . ALPRAZolam (XANAX) 0.5 MG tablet Take  1 tablet (0.5 mg total) by mouth 2 (two) times daily as needed.  Marland Kitchen aspirin 81 MG tablet Take 81 mg by mouth daily.  . B-D UF III MINI PEN NEEDLES 31G X 5 MM MISC USE  TO INJECT  INSULIN THREE TIMES DAILY  . Blood Glucose Monitoring Suppl (TRUE METRIX AIR GLUCOSE METER) w/Device KIT TEST FOUR TIMES DAILY  . Calcium Carb-Cholecalciferol (CALCIUM 500 + D3) 500-600 MG-UNIT TABS Take 1 tablet by mouth daily.  Marland Kitchen docusate sodium 100 MG CAPS Take 100 mg by mouth 2 (two) times daily.  . furosemide (LASIX) 40 MG tablet TAKE 1/2 TABLET EVERY DAY  . insulin aspart (NOVOLOG) 100 UNIT/ML FlexPen Inject 2-4 Units into the skin 3 (three) times daily with meals.  . Insulin Glargine (LANTUS SOLOSTAR) 100 UNIT/ML Solostar Pen Inject 12 Units into the skin daily with breakfast.  . ketoconazole (NIZORAL) 2 % cream   . levothyroxine (SYNTHROID, LEVOTHROID) 25 MCG tablet Take 1 tablet (25 mcg total) by mouth daily before breakfast.  . lisinopril (PRINIVIL,ZESTRIL) 20 MG tablet Take 1 tablet (20 mg total) by mouth daily.  . metoprolol tartrate (LOPRESSOR) 25 MG tablet TAKE 1 TABLET TWICE DAILY  . mometasone (NASONEX) 50 MCG/ACT nasal spray Place 2 sprays into the nose daily.  . Multiple Vitamin (MULTIVITAMIN) tablet Take 1 tablet by mouth daily.  Marland Kitchen NITROSTAT 0.4 MG  SL tablet Place 1 tablet (0.4 mg total) under the tongue every 5 (five) minutes as needed for chest pain.  . polyethylene glycol powder (GLYCOLAX/MIRALAX) powder Mix 1 capsul (=17grams) with beverage of your choice.  Drink entire mixture once a day.  . potassium chloride SA (K-DUR,KLOR-CON) 20 MEQ tablet TAKE 1 TABLET EVERY DAY  . PROLIA 60 MG/ML SOLN injection Inject 60 mg into the skin every 6 (six) months. To be administered 08/20/2016  . TRUE METRIX BLOOD GLUCOSE TEST test strip TEST FOUR TIMES DAILY  . TRUEPLUS LANCETS 30G MISC TEST FOUR TIMES DAILY   No facility-administered encounter medications on file as of 09/10/2016.    ALLERGIES: Allergies   Allergen Reactions  . Azithromycin Other (See Comments)    Hospital reaction  . Codeine     REACTION: nausea  . Invokana [Canagliflozin]    VACCINATION STATUS: Immunization History  Administered Date(s) Administered  . Influenza, High Dose Seasonal PF 02/21/2016  . Influenza,inj,Quad PF,36+ Mos 03/02/2014, 02/23/2015  . Influenza-Unspecified 02/21/2013  . Pneumococcal Conjugate-13 03/02/2014  . Zoster 09/02/2013    Diabetes  She presents for her follow-up diabetic visit. She has type 2 diabetes mellitus. Onset time: She was diagnosed at approximate age of 76 years. Her disease course has been worsening. There are no hypoglycemic associated symptoms. Pertinent negatives for hypoglycemia include no confusion, headaches, pallor or seizures. There are no diabetic associated symptoms. Pertinent negatives for diabetes include no chest pain and no polyphagia. There are no hypoglycemic complications. Symptoms are worsening. Diabetic complications include heart disease and PVD. Risk factors for coronary artery disease include diabetes mellitus and hypertension. Current diabetic treatment includes insulin injections (She is taking 16 units of Lantus in the morning and NovoLog 3-10 units with meals.). Her weight is stable. She is following a generally unhealthy diet. When asked about meal planning, she reported none. She has not had a previous visit with a dietitian. She participates in exercise intermittently. Her home blood glucose trend is fluctuating minimally (Her logs show that she has less frequent hypoglycemia than before, however she made insulin dosing errors.). Her breakfast blood glucose range is generally 140-180 mg/dl. Her lunch blood glucose range is generally 140-180 mg/dl. Her dinner blood glucose range is generally 140-180 mg/dl. Her overall blood glucose range is 140-180 mg/dl. An ACE inhibitor/angiotensin II receptor blocker is being taken. Eye exam is current.  Hypertension  This is a  chronic problem. The current episode started more than 1 year ago. The problem is controlled. Pertinent negatives include no chest pain, headaches, palpitations or shortness of breath. Risk factors for coronary artery disease include diabetes mellitus. Past treatments include beta blockers. Hypertensive end-organ damage includes PVD.     Review of Systems  Constitutional: Negative for unexpected weight change.  HENT: Negative for trouble swallowing and voice change.   Eyes: Negative for visual disturbance.  Respiratory: Negative for cough, shortness of breath and wheezing.   Cardiovascular: Negative for chest pain, palpitations and leg swelling.  Gastrointestinal: Negative for diarrhea, nausea and vomiting.  Endocrine: Negative for cold intolerance, heat intolerance and polyphagia.  Genitourinary: Positive for frequency. Negative for dysuria and flank pain.  Musculoskeletal: Negative for arthralgias and myalgias.  Skin: Negative for color change, pallor, rash and wound.  Neurological: Negative for seizures and headaches.  Psychiatric/Behavioral: Negative for confusion and suicidal ideas.    Objective:    BP (!) 153/83   Pulse 72   Ht _0  (1.499 m)   Wt 122 lb (55.3  kg)   BMI 24.64 kg/m   Wt Readings from Last 3 Encounters:  09/10/16 122 lb (55.3 kg)  08/04/16 119 lb (54 kg)  06/10/16 116 lb (52.6 kg)    Physical Exam  Constitutional: She is oriented to person, place, and time. She appears well-developed.  HENT:  Head: Normocephalic and atraumatic.  Eyes: EOM are normal.  Neck: Normal range of motion. Neck supple. No tracheal deviation present. No thyromegaly present.  Cardiovascular: Normal rate and regular rhythm.   Pulmonary/Chest: Effort normal and breath sounds normal.  Abdominal: Soft. Bowel sounds are normal. There is no tenderness. There is no guarding.  Musculoskeletal: Normal range of motion. She exhibits no edema.  Neurological: She is alert and oriented to  person, place, and time. She has normal reflexes. No cranial nerve deficit. Coordination normal.  Skin: Skin is warm and dry. No rash noted. No erythema. No pallor.  Psychiatric: She has a normal mood and affect. Judgment normal.    CMP     Component Value Date/Time   NA 140 09/03/2016 0000   K 4.6 09/03/2016 0000   CL 100 09/03/2016 0000   CO2 24 09/03/2016 0000   GLUCOSE 194 (H) 09/03/2016 0000   GLUCOSE 134 (H) 03/17/2014 0440   BUN 39 (H) 09/03/2016 0000   CREATININE 1.18 (H) 09/03/2016 0000   CREATININE 0.99 10/25/2012 0952   CALCIUM 9.3 09/03/2016 0000   PROT 6.7 09/03/2016 0000   ALBUMIN 4.2 09/03/2016 0000   AST 27 09/03/2016 0000   ALT 24 09/03/2016 0000   ALKPHOS 94 09/03/2016 0000   BILITOT 0.4 09/03/2016 0000   GFRNONAA 45 (L) 09/03/2016 0000   GFRNONAA 58 (L) 10/25/2012 0952   GFRAA 52 (L) 09/03/2016 0000   GFRAA 66 10/25/2012 0952    Diabetic Labs (most recent): Lab Results  Component Value Date   HGBA1C 8.1 (H) 09/03/2016   HGBA1C 7 04/25/2016   HGBA1C 6.8 (H) 01/18/2016     Lipid Panel ( most recent) Lipid Panel     Component Value Date/Time   CHOL 226 (H) 05/29/2015 1125   TRIG 102 05/29/2015 1125   TRIG 148 07/21/2014 1305   HDL 74 05/29/2015 1125   HDL 73 07/21/2014 1305   CHOLHDL 3.1 05/29/2015 1125   CHOLHDL 3.2 10/25/2012 0952   VLDL 18 10/25/2012 0952   LDLCALC 132 (H) 05/29/2015 1125      Assessment & Plan:   1. Uncontrolled type 2 diabetes mellitus with complication, with long-term current use of insulin ([place])  - Patient has currently uncontrolled symptomatic type 2 DM since  76 years of age. She Came with slightly better however still fluctuating blood glucose profile. She does have regular random hypoglycemia, mainly associated with inadequate oral intake or misdemeanors. Her recent A1c of 8.1% increasing from 7%.  Recent labs reviewed.    her diabetes is complicated by coronary artery disease and peripheral arterial disease  and patient remains at a high risk for more acute and chronic complications of diabetes which include CAD, CVA, CKD, retinopathy, and neuropathy. These are all discussed in detail with the patient.  - I have counseled the patient on diet management  by adopting a carbohydrate restricted/protein rich diet.  - Suggestion is made for patient to avoid simple carbohydrates   from their diet including Cakes , Desserts, Ice Cream,  Soda (  diet and regular) , Sweet Tea , Candies,  Chips, Cookies, Artificial Sweeteners,   and "Sugar-free" Products . This will help  patient to have stable blood glucose profile and potentially avoid unintended weight gain.  - I encouraged the patient to switch to  unprocessed or minimally processed complex starch and increased protein intake (animal or plant source), fruits, and vegetables.  - Patient is advised to stick to a routine mealtimes to eat 3 meals  a day and avoid unnecessary snacks ( to snack only to correct hypoglycemia).  - The patient will be scheduled with Jearld Fenton, RDN, CDE for individualized DM education.  - I have approached patient with the following individualized plan to manage diabetes and patient agrees:   - The primary objective in the care of this patient is to avoid hypoglycemia. - She will need a higher dose of insulin to achieve control. - I will increase basal insulin Lantus to 14 units  every morning at 8 AM with breakfast, continue  her prandial insulin NovoLog to 2-4 units 3 times a day before meals  for pre-meal BG readings of 90-161m/dl, plus patient specific correction dose for unexpected hyperglycemia above 1561mdl, associated with strict monitoring of glucose  AC and HS. - Patient is warned not to take insulin without proper monitoring per orders. -Adjustment parameters are given for hypo and hyperglycemia in writing. -Patient is encouraged to call clinic for blood glucose levels less than 70 or above 300 mg /dl. -She reports  that she is getting some help from her daughter was not accompanying her today.  -  She is not a good candidate for SGLT2 I,  Incretin therapy, but will be considered for low-dose metformin if she continues to have normal renal function.   - Patient specific target  A1c;  LDL, HDL, Triglycerides, and  Waist Circumference were discussed in detail.  2) BP/HTN: Controlled. Continue current medications including ACEI/ARB. 3) Lipids/HPL:  Uncontrolled, LDL 132,  she will be considered for  statins. 4)  Weight/Diet: CDE Consult will be initiated , exercise, and detailed carbohydrates information provided.  5) hypothyroidism: - Her labs are consistent with over replacement with levothyroxine.  -I will continue  levothyroxine to 25 g by mouth every morning.  - We discussed about correct intake of levothyroxine, at fasting, with water, separated by at least 30 minutes from breakfast, and separated by more than 4 hours from calcium, iron, multivitamins, acid reflux medications (PPIs). -Patient is made aware of the fact that thyroid hormone replacement is needed for life, dose to be adjusted by periodic monitoring of thyroid function tests.  6) Chronic Care/Health Maintenance:  - She is not on ACEI/ARB nor Statin medications, encouraged to continue to follow up with Ophthalmology, Podiatrist at least yearly or according to recommendations, and advised to   stay away from smoking. I have recommended yearly flu vaccine and pneumonia vaccination at least every 5 years; moderate intensity exercise for up to 150 minutes weekly; and  sleep for at least 7 hours a day.  - 25 minutes of time was spent on the care of this patient , 50% of which was applied for counseling on diabetes complications and their preventions.  - Patient to bring meter and  blood glucose logs during their next visit.   - I advised patient to maintain close follow up with StClaretta FraiseMD for primary care needs.  Follow up  plan: - Return in about 3 months (around 12/11/2016) for follow up with pre-visit labs, meter, and logs.  GeGlade LloydMD Phone: [phone]: [phone] 09/10/2016, 1:59 PM

## 2016-09-11 ENCOUNTER — Other Ambulatory Visit: Payer: Self-pay | Admitting: *Deleted

## 2016-09-11 MED ORDER — NITROSTAT 0.4 MG SL SUBL: 0.4000 mg | SUBLINGUAL_TABLET | SUBLINGUAL | 0 refills | Status: DC | PRN

## 2016-09-11 MED ORDER — DICLOFENAC SODIUM 75 MG PO TBEC: 75.0000 mg | DELAYED_RELEASE_TABLET | Freq: Two times a day (BID) | ORAL | 0 refills | Status: DC

## 2016-09-15 ENCOUNTER — Other Ambulatory Visit: Payer: Self-pay | Admitting: Family Medicine

## 2016-10-07 ENCOUNTER — Other Ambulatory Visit: Payer: Self-pay | Admitting: Family Medicine

## 2016-10-21 ENCOUNTER — Other Ambulatory Visit: Payer: Self-pay | Admitting: "Endocrinology

## 2016-10-21 ENCOUNTER — Other Ambulatory Visit: Payer: Self-pay | Admitting: Family Medicine

## 2016-10-21 DIAGNOSIS — I10 Essential (primary) hypertension: Secondary | ICD-10-CM

## 2016-11-01 ENCOUNTER — Other Ambulatory Visit: Payer: Self-pay | Admitting: Family Medicine

## 2016-11-04 ENCOUNTER — Encounter: Payer: Self-pay | Admitting: Family Medicine

## 2016-11-04 ENCOUNTER — Ambulatory Visit (INDEPENDENT_AMBULATORY_CARE_PROVIDER_SITE_OTHER): Payer: Medicare HMO | Admitting: Family Medicine

## 2016-11-04 ENCOUNTER — Ambulatory Visit (INDEPENDENT_AMBULATORY_CARE_PROVIDER_SITE_OTHER): Payer: Medicare HMO

## 2016-11-04 VITALS — BP 164/65 | HR 70 | Temp 97.3°F | Ht 59.0 in | Wt 116.0 lb

## 2016-11-04 DIAGNOSIS — I480 Paroxysmal atrial fibrillation: Secondary | ICD-10-CM | POA: Diagnosis not present

## 2016-11-04 DIAGNOSIS — Z794 Long term (current) use of insulin: Secondary | ICD-10-CM | POA: Diagnosis not present

## 2016-11-04 DIAGNOSIS — M25552 Pain in left hip: Secondary | ICD-10-CM | POA: Diagnosis not present

## 2016-11-04 DIAGNOSIS — I10 Essential (primary) hypertension: Secondary | ICD-10-CM | POA: Diagnosis not present

## 2016-11-04 DIAGNOSIS — E038 Other specified hypothyroidism: Secondary | ICD-10-CM

## 2016-11-04 DIAGNOSIS — E1159 Type 2 diabetes mellitus with other circulatory complications: Secondary | ICD-10-CM | POA: Diagnosis not present

## 2016-11-04 DIAGNOSIS — G8929 Other chronic pain: Secondary | ICD-10-CM

## 2016-11-04 DIAGNOSIS — E1165 Type 2 diabetes mellitus with hyperglycemia: Secondary | ICD-10-CM

## 2016-11-04 DIAGNOSIS — IMO0002 Reserved for concepts with insufficient information to code with codable children: Secondary | ICD-10-CM

## 2016-11-04 MED ORDER — AMLODIPINE BESYLATE 5 MG PO TABS: 5.0000 mg | ORAL_TABLET | Freq: Every day | ORAL | 5 refills | Status: DC

## 2016-11-04 MED ORDER — FUROSEMIDE 20 MG PO TABS: 10.0000 mg | ORAL_TABLET | Freq: Every day | ORAL | 2 refills | Status: DC

## 2016-11-04 MED ORDER — NAPROXEN 500 MG PO TABS: 500.0000 mg | ORAL_TABLET | Freq: Two times a day (BID) | ORAL | 2 refills | Status: DC

## 2016-11-04 MED ORDER — ALPRAZOLAM 0.5 MG PO TABS: 0.5000 mg | ORAL_TABLET | Freq: Two times a day (BID) | ORAL | 0 refills | Status: DC | PRN

## 2016-11-04 NOTE — Progress Notes (Signed)
Subjective:  Patient ID: Ellen Woods, female    DOB: April 04, 1941  Age: 76 y.o. MRN: [id number]  CC: Hip Pain (pt here today c/o left hip pain and also some dizzy spells after bending down and raising her head back up. She also wants a refill on her xanax.)   HPI MAKYLEE SANBORN presents for Sensation of being woozy onset yesterday when she bends over. She laid down and it felt better but when she got up it came back. She felt better this morning. But after she was up briefly it came back. She has been taking her furosemide and other blood pressure medicines as well as her thyroid pill regularly. She is taking the metoprolol for heart rate control. She also is taking her insulin. She only took one unit of NovoLog this morning when her blood sugar was 125 at lunchtime she had a blood sugar of 260. She is taking Lantus daily at 14 units a day. Of note is that her most recent A1c done 2 months ago was 8.1.  She denies loss of consciousness. She does feel a bit off balance.  Patient reports she is having moderate pain in the left hip. It's been persistent for 2-3 months. She has not been taking the diclofenac to her recollection. She has however been taking ibuprofen over-the-counter in unknown amounts with occasional intermittent relief only.  Depression screen [facility] 2/9 11/04/2016 09/10/2016 06/10/2016  Decreased Interest 3 0 0  Down, Depressed, Hopeless 0 0 0  PHQ - 2 Score 3 0 0  Altered sleeping 2 - -  Tired, decreased energy 0 - -  Change in appetite 0 - -  Feeling bad or failure about yourself  0 - -  Trouble concentrating 0 - -  Moving slowly or fidgety/restless 0 - -  Suicidal thoughts 0 - -  PHQ-9 Score 5 - -    History Mallori has a past medical history of Arthritis; Back pain (1/14); COPD (chronic obstructive pulmonary disease) ([facility]); Coronary atherosclerosis of native coronary artery; Encounter for long-term (current) use of insulin ([facility]); Family history of anesthesia complication;  PDA (patent ductus arteriosus); Pericardial calcification; Thyroid disease; Type II or unspecified type diabetes mellitus without mention of complication, not stated as uncontrolled; and Unspecified essential hypertension.   She has a past surgical history that includes Incisional hernia repair; Tubal ligation; Intramedullary (im) nail intertrochanteric (Left, 11/13/2012); and Abdominal hysterectomy.   Her family history includes Diabetes in her brother; Diabetes (age of onset: 36) in her brother; Diabetes (age of onset: 4) in her son; Early death in her brother; Hypertension in her mother; Stroke in her brother and mother.She reports that she has never smoked. She has never used smokeless tobacco. She reports that she does not drink alcohol or use drugs.    ROS Review of Systems  Objective:  BP (!) 164/65   Pulse 70   Temp (!) 97.3 F (36.3 C) (Oral)   Ht 4\' 11"  (1.499 m)   Wt 116 lb (52.6 kg)   BMI 23.43 kg/m   BP Readings from Last 3 Encounters:  11/04/16 (!) 164/65  09/10/16 (!) 153/83  08/04/16 (!) 142/64    Wt Readings from Last 3 Encounters:  11/04/16 116 lb (52.6 kg)  09/10/16 122 lb (55.3 kg)  08/04/16 119 lb (54 kg)     Physical Exam    Assessment & Plan:   Dlisa was seen today for hip pain.  Diagnoses and all orders for this visit:  Essential hypertension, benign  PAF (paroxysmal atrial fibrillation) ([facility])  Other specified hypothyroidism  Uncontrolled type 2 diabetes mellitus with other circulatory complication, with long-term current use of insulin ([facility])  Chronic left hip pain -     DG HIP UNILAT W OR W/O PELVIS 2-3 VIEWS LEFT; Future  Other orders -     furosemide (LASIX) 20 MG tablet; Take 0.5 tablets (10 mg total) by mouth daily. -     ALPRAZolam (XANAX) 0.5 MG tablet; Take 1 tablet (0.5 mg total) by mouth 2 (two) times daily as needed. -     naproxen (NAPROSYN) 500 MG tablet; Take 1 tablet (500 mg total) by mouth 2 (two) times daily with a  meal. For hip (and other joint) pain -     amLODipine (NORVASC) 5 MG tablet; Take 1 tablet (5 mg total) by mouth daily. For blood pressure       I have discontinued Ms. Pietrzyk's TRUE METRIX AIR GLUCOSE METER, ketoconazole, TRUE METRIX BLOOD GLUCOSE TEST, TRUEPLUS LANCETS 30G, B-D UF III MINI PEN NEEDLES, and diclofenac. I have also changed her furosemide and ALPRAZolam. Additionally, I am having her start on naproxen and amLODipine. Lastly, I am having her maintain her multivitamin, acetaminophen, aspirin, DSS, mometasone, Calcium Carb-Cholecalciferol, insulin aspart, polyethylene glycol powder, Insulin Glargine, PROLIA, potassium chloride SA, metoprolol tartrate, B-D SINGLE USE SWABS REGULAR, lisinopril, levothyroxine, and NITROSTAT.  Allergies as of 11/04/2016      Reactions   Azithromycin Other (See Comments)   Hospital reaction   Codeine    REACTION: nausea   Invokana [canagliflozin]       Medication List       Accurate as of 11/04/16  2:35 PM. Always use your most recent med list.          acetaminophen 325 MG tablet Commonly known as:  TYLENOL Take 2 tablets (650 mg total) by mouth every 6 (six) hours as needed.   ALPRAZolam 0.5 MG tablet Commonly known as:  XANAX Take 1 tablet (0.5 mg total) by mouth 2 (two) times daily as needed.   amLODipine 5 MG tablet Commonly known as:  NORVASC Take 1 tablet (5 mg total) by mouth daily. For blood pressure   aspirin 81 MG tablet Take 81 mg by mouth daily.   B-D SINGLE USE SWABS REGULAR Pads USE FOR FINGERSTICK BLOOD SUGAR TESTING SIX TIMES DAILY   Calcium Carb-Cholecalciferol 500-600 MG-UNIT Tabs Commonly known as:  CALCIUM 500 + D3 Take 1 tablet by mouth daily.   DSS 100 MG Caps Take 100 mg by mouth 2 (two) times daily.   furosemide 20 MG tablet Commonly known as:  LASIX Take 0.5 tablets (10 mg total) by mouth daily.   insulin aspart 100 UNIT/ML FlexPen Commonly known as:  NOVOLOG Inject 2-4 Units into the skin 3  (three) times daily with meals.   Insulin Glargine 100 UNIT/ML Solostar Pen Commonly known as:  LANTUS SOLOSTAR Inject 12 Units into the skin daily with breakfast.   levothyroxine 25 MCG tablet Commonly known as:  SYNTHROID, LEVOTHROID TAKE 1 TABLET (25 MCG TOTAL) BY MOUTH DAILY BEFORE BREAKFAST (NEW DOSE).   lisinopril 20 MG tablet Commonly known as:  PRINIVIL,ZESTRIL TAKE 1 TABLET EVERY DAY   metoprolol tartrate 25 MG tablet Commonly known as:  LOPRESSOR TAKE 1 TABLET TWICE DAILY   mometasone 50 MCG/ACT nasal spray Commonly known as:  NASONEX Place 2 sprays into the nose daily.   multivitamin tablet Take 1 tablet by mouth daily.  naproxen 500 MG tablet Commonly known as:  NAPROSYN Take 1 tablet (500 mg total) by mouth 2 (two) times daily with a meal. For hip (and other joint) pain   NITROSTAT 0.4 MG SL tablet Generic drug:  nitroGLYCERIN DISSOLVE  1 TABLET  UNDER THE TONGUE EVERY 5 MINUTES AS NEEDED FOR CHEST PAIN   polyethylene glycol powder powder Commonly known as:  GLYCOLAX/MIRALAX Mix 1 capsul (=17grams) with beverage of your choice.  Drink entire mixture once a day.   potassium chloride SA 20 MEQ tablet Commonly known as:  K-DUR,KLOR-CON TAKE 1 TABLET EVERY DAY   PROLIA 60 MG/ML Soln injection Generic drug:  denosumab Inject 60 mg into the skin every 6 (six) months. To be administered 08/20/2016        Follow-up: Return in about 6 weeks (around 12/16/2016).  Mechele ClaudeWarren Jatniel Verastegui, M.D.

## 2016-11-13 DIAGNOSIS — B351 Tinea unguium: Secondary | ICD-10-CM | POA: Diagnosis not present

## 2016-11-13 DIAGNOSIS — L84 Corns and callosities: Secondary | ICD-10-CM | POA: Diagnosis not present

## 2016-11-13 DIAGNOSIS — E1142 Type 2 diabetes mellitus with diabetic polyneuropathy: Secondary | ICD-10-CM | POA: Diagnosis not present

## 2016-11-13 DIAGNOSIS — M79676 Pain in unspecified toe(s): Secondary | ICD-10-CM | POA: Diagnosis not present

## 2016-11-26 ENCOUNTER — Other Ambulatory Visit: Payer: Self-pay | Admitting: Family Medicine

## 2016-12-04 ENCOUNTER — Other Ambulatory Visit: Payer: Medicare HMO

## 2016-12-04 DIAGNOSIS — E038 Other specified hypothyroidism: Secondary | ICD-10-CM | POA: Diagnosis not present

## 2016-12-04 DIAGNOSIS — E1159 Type 2 diabetes mellitus with other circulatory complications: Secondary | ICD-10-CM | POA: Diagnosis not present

## 2016-12-04 DIAGNOSIS — E1165 Type 2 diabetes mellitus with hyperglycemia: Secondary | ICD-10-CM | POA: Diagnosis not present

## 2016-12-04 DIAGNOSIS — Z794 Long term (current) use of insulin: Secondary | ICD-10-CM | POA: Diagnosis not present

## 2016-12-05 LAB — CMP14+EGFR
A/G RATIO: 1.8 (ref 1.2–2.2)
ALBUMIN: 4.3 g/dL (ref 3.5–4.8)
ALT: 18 IU/L (ref 0–32)
AST: 25 IU/L (ref 0–40)
Alkaline Phosphatase: 95 IU/L (ref 39–117)
BUN/Creatinine Ratio: 26 (ref 12–28)
BUN: 27 mg/dL (ref 8–27)
Bilirubin Total: 0.5 mg/dL (ref 0.0–1.2)
CALCIUM: 9 mg/dL (ref 8.7–10.3)
CO2: 24 mmol/L (ref 20–29)
CREATININE: 1.03 mg/dL — AB (ref 0.57–1.00)
Chloride: 101 mmol/L (ref 96–106)
GFR, EST AFRICAN AMERICAN: 61 mL/min/{1.73_m2} (ref >59)
GFR, EST NON AFRICAN AMERICAN: 53 mL/min/{1.73_m2} — AB (ref >59)
GLOBULIN, TOTAL: 2.4 g/dL (ref 1.5–4.5)
Glucose: 287 mg/dL — ABNORMAL HIGH (ref 65–99)
Potassium: 4.5 mmol/L (ref 3.5–5.2)
SODIUM: 141 mmol/L (ref 134–144)
TOTAL PROTEIN: 6.7 g/dL (ref 6.0–8.5)

## 2016-12-05 LAB — T4, FREE: FREE T4: 1.21 ng/dL (ref 0.82–1.77)

## 2016-12-05 LAB — TSH: TSH: 5.29 u[IU]/mL — ABNORMAL HIGH (ref 0.450–4.500)

## 2016-12-08 ENCOUNTER — Encounter: Payer: Self-pay | Admitting: Cardiovascular Disease

## 2016-12-08 ENCOUNTER — Ambulatory Visit (INDEPENDENT_AMBULATORY_CARE_PROVIDER_SITE_OTHER): Payer: Medicare HMO | Admitting: Cardiovascular Disease

## 2016-12-08 ENCOUNTER — Telehealth: Payer: Self-pay | Admitting: Cardiovascular Disease

## 2016-12-08 ENCOUNTER — Encounter: Payer: Self-pay | Admitting: *Deleted

## 2016-12-08 VITALS — BP 166/73 | HR 78 | Ht 59.0 in | Wt 114.0 lb

## 2016-12-08 DIAGNOSIS — I25118 Atherosclerotic heart disease of native coronary artery with other forms of angina pectoris: Secondary | ICD-10-CM

## 2016-12-08 DIAGNOSIS — I10 Essential (primary) hypertension: Secondary | ICD-10-CM

## 2016-12-08 DIAGNOSIS — R9431 Abnormal electrocardiogram [ECG] [EKG]: Secondary | ICD-10-CM | POA: Diagnosis not present

## 2016-12-08 DIAGNOSIS — E785 Hyperlipidemia, unspecified: Secondary | ICD-10-CM | POA: Diagnosis not present

## 2016-12-08 DIAGNOSIS — R002 Palpitations: Secondary | ICD-10-CM | POA: Diagnosis not present

## 2016-12-08 DIAGNOSIS — I209 Angina pectoris, unspecified: Secondary | ICD-10-CM

## 2016-12-08 MED ORDER — ROSUVASTATIN CALCIUM 5 MG PO TABS: 5.0000 mg | ORAL_TABLET | Freq: Every day | ORAL | 6 refills | Status: DC

## 2016-12-08 MED ORDER — LISINOPRIL 40 MG PO TABS: 40.0000 mg | ORAL_TABLET | Freq: Every day | ORAL | 6 refills | Status: DC

## 2016-12-08 NOTE — Patient Instructions (Addendum)
Medication Instructions:   Increase Lisinopril to 40mg  daily.  Please begin the Amlodipine 5mg  daily as previously instructed.    Begin Crestor 5mg  daily.  Continue all other medications.    Labwork: none  Testing/Procedures:  Your physician has requested that you have en exercise stress myoview. For further information please visit [email or web address]. Please follow instruction sheet, as given.  Office will contact with results via phone or letter.    Follow-Up: 2 months - [place] with Dr. Antoine PocheHochrein  Any Other Special Instructions Will Be Listed Below (If Applicable).  If you need a refill on your cardiac medications before your next appointment, please call your pharmacy.

## 2016-12-08 NOTE — Telephone Encounter (Signed)
Exercise Myoview - angina pectoris, ab[facility]

## 2016-12-08 NOTE — Addendum Note (Signed)
Addended by: Lesle ChrisHILL, ANGELA G on: 12/08/2016 01:37 PM   Modules accepted: Orders

## 2016-12-08 NOTE — Progress Notes (Signed)
SUBJECTIVE: The patient presents for follow-up of palpitations. She has a clinically insignificant PDA. She also has nonobstructive coronary artery disease and hypertension.  She underwent cardiac catheterization on 05/21/2010 by Dr. Clifton James which revealed a proximal calcified LAD stenosis of approximately 50% and a calcified long tubular mid LAD stenosis of 50-60%. She had normal left ventricular systolic function. She also had normal filling pressures.  ECG performed in the office today which I personally interpreted demonstrated sinus rhythm with diffuse T wave inversions, suggestive of inferior and anterolateral ischemia. This is changed from June 2017.  She has been having more frequent chest pains. It is located retrosternally and described as sharp. She has also been having more exertional dyspnea.   Review of Systems: As per "subjective", otherwise negative.  Allergies  Allergen Reactions  . Azithromycin Other (See Comments)    Hospital reaction  . Codeine     REACTION: nausea  . Invokana [Canagliflozin]     Current Outpatient Prescriptions  Medication Sig Dispense Refill  . acetaminophen (TYLENOL) 325 MG tablet Take 2 tablets (650 mg total) by mouth every 6 (six) hours as needed.    . Alcohol Swabs (B-D SINGLE USE SWABS REGULAR) PADS USE FOR FINGERSTICK BLOOD SUGAR TESTING SIX TIMES DAILY 600 each 2  . ALPRAZolam (XANAX) 0.5 MG tablet Take 1 tablet (0.5 mg total) by mouth 2 (two) times daily as needed. 180 tablet 0  . amLODipine (NORVASC) 5 MG tablet Take 1 tablet (5 mg total) by mouth daily. For blood pressure 30 tablet 5  . aspirin 81 MG tablet Take 81 mg by mouth daily.    . Calcium Carb-Cholecalciferol (CALCIUM 500 + D3) 500-600 MG-UNIT TABS Take 1 tablet by mouth daily. 60 tablet   . docusate sodium 100 MG CAPS Take 100 mg by mouth 2 (two) times daily. 60 capsule 0  . furosemide (LASIX) 20 MG tablet Take 0.5 tablets (10 mg total) by mouth daily. 15 tablet 2  .  insulin aspart (NOVOLOG) 100 UNIT/ML FlexPen Inject 2-4 Units into the skin 3 (three) times daily with meals. 5 pen 3  . Insulin Glargine (LANTUS SOLOSTAR) 100 UNIT/ML Solostar Pen Inject 12 Units into the skin daily with breakfast. 5 pen 2  . levothyroxine (SYNTHROID, LEVOTHROID) 25 MCG tablet TAKE 1 TABLET (25 MCG TOTAL) BY MOUTH DAILY BEFORE BREAKFAST (NEW DOSE). 90 tablet 1  . lisinopril (PRINIVIL,ZESTRIL) 20 MG tablet TAKE 1 TABLET EVERY DAY 90 tablet 0  . metoprolol tartrate (LOPRESSOR) 25 MG tablet TAKE 1 TABLET TWICE DAILY 180 tablet 1  . mometasone (NASONEX) 50 MCG/ACT nasal spray Place 2 sprays into the nose daily. 17 g 12  . Multiple Vitamin (MULTIVITAMIN) tablet Take 1 tablet by mouth daily.    . naproxen (NAPROSYN) 500 MG tablet Take 1 tablet (500 mg total) by mouth 2 (two) times daily with a meal. For hip (and other joint) pain 60 tablet 2  . NITROSTAT 0.4 MG SL tablet DISSOLVE  1 TABLET  UNDER THE TONGUE EVERY 5 MINUTES AS NEEDED FOR CHEST PAIN 25 tablet 0  . polyethylene glycol powder (GLYCOLAX/MIRALAX) powder Mix 1 capsul (=17grams) with beverage of your choice.  Drink entire mixture once a day. 1734 g 1  . potassium chloride SA (K-DUR,KLOR-CON) 20 MEQ tablet TAKE 1 TABLET EVERY DAY 90 tablet 1  . PROLIA 60 MG/ML SOLN injection Inject 60 mg into the skin every 6 (six) months. To be administered 08/20/2016 1 each 0   No  current facility-administered medications for this visit.     Past Medical History:  Diagnosis Date  . Arthritis   . Back pain 1/14   Tx by orthopedics  . COPD (chronic obstructive pulmonary disease) (HCC)   . Coronary atherosclerosis of native coronary artery    50% LAD stenosis  . Encounter for long-term (current) use of insulin (HCC)   . Family history of anesthesia complication    Daughter has nausea  . PDA (patent ductus arteriosus)    Small PDA documented by CT angiography, no pulmonary hypertension  . Pericardial calcification    Ruled out for  restrictive pericarditis  . Thyroid disease   . Type II or unspecified type diabetes mellitus without mention of complication, not stated as uncontrolled   . Unspecified essential hypertension     Past Surgical History:  Procedure Laterality Date  . ABDOMINAL HYSTERECTOMY     partial  . INCISIONAL HERNIA REPAIR    . INTRAMEDULLARY (IM) NAIL INTERTROCHANTERIC Left 11/13/2012   Procedure: INTRAMEDULLARY (IM) NAIL INTERTROCHANTRIC LEFT HIP;  Surgeon: Velna Ochs, MD;  Location: [facility] OR;  Service: Orthopedics;  Laterality: Left;  . TUBAL LIGATION      Social History   Social History  . Marital status: Widowed    Spouse name: N/A  . Number of children: 2  . Years of education: N/A   Occupational History  . Not on file.   Social History Main Topics  . Smoking status: Never Smoker  . Smokeless tobacco: Never Used  . Alcohol use No  . Drug use: No  . Sexual activity: No   Other Topics Concern  . Not on file   Social History Narrative  . No narrative on file     Vitals:   12/08/16 1256  BP: (!) 166/73  Pulse: 78  SpO2: 95%  Weight: 114 lb (51.7 kg)  Height: 4\' 11"  (1.499 m)    Wt Readings from Last 3 Encounters:  12/08/16 114 lb (51.7 kg)  11/04/16 116 lb (52.6 kg)  09/10/16 122 lb (55.3 kg)     PHYSICAL EXAM General: NAD HEENT: Normal. Neck: No JVD, no thyromegaly. Lungs: Clear to auscultation bilaterally with normal respiratory effort. CV: Nondisplaced PMI.  Regular rate and rhythm, normal S1/S2, no S3/S4, no murmur. No pretibial or periankle edema.  No carotid bruit.   Abdomen: Soft, nontender, no distention.  Neurologic: Alert and oriented.  Psych: Normal affect. Skin: Normal. Musculoskeletal: No gross deformities.    ECG: Most recent ECG reviewed.   Labs: Lab Results  Component Value Date/Time   K 4.5 12/04/2016 09:09 AM   BUN 27 12/04/2016 09:09 AM   CREATININE 1.03 (H) 12/04/2016 09:09 AM   CREATININE 0.99 10/25/2012 09:52 AM   ALT 18  12/04/2016 09:09 AM   TSH 5.290 (H) 12/04/2016 09:09 AM   HGB WILL FOLLOW 09/03/2016 12:00 AM     Lipids: Lab Results  Component Value Date/Time   LDLCALC 132 (H) 05/29/2015 11:25 AM   CHOL 226 (H) 05/29/2015 11:25 AM   TRIG 102 05/29/2015 11:25 AM   TRIG 148 07/21/2014 01:05 PM   HDL 74 05/29/2015 11:25 AM   HDL 73 07/21/2014 01:05 PM       ASSESSMENT AND PLAN: 1. Coronary artery disease with new ECG abnormalities and angina pectoris: I will evaluate for hemodynamically significant progression of LAD disease with an exercise Myoview stress test. She is on metoprolol and aspirin. Given her history of diabetes, statin therapy is indicated.  I will start Crestor 5 mg.  2. Hypertension: Elevated. I will increase lisinopril to 40 mg.  3. Hyperlipidemia: Given her history of diabetes, statin therapy is indicated. I will start Crestor 5 mg.  4. Palpitations: Symptomatically stable on metoprolol. No changes.      Disposition: Follow up 2 months   Prentice DockerSuresh Correna Meacham, M.D., F.A.C.C.

## 2016-12-11 ENCOUNTER — Ambulatory Visit (INDEPENDENT_AMBULATORY_CARE_PROVIDER_SITE_OTHER): Payer: Medicare HMO | Admitting: "Endocrinology

## 2016-12-11 ENCOUNTER — Encounter: Payer: Self-pay | Admitting: "Endocrinology

## 2016-12-11 VITALS — BP 121/70 | HR 80 | Wt 113.0 lb

## 2016-12-11 DIAGNOSIS — I10 Essential (primary) hypertension: Secondary | ICD-10-CM | POA: Diagnosis not present

## 2016-12-11 DIAGNOSIS — E1165 Type 2 diabetes mellitus with hyperglycemia: Secondary | ICD-10-CM | POA: Diagnosis not present

## 2016-12-11 DIAGNOSIS — E038 Other specified hypothyroidism: Secondary | ICD-10-CM | POA: Diagnosis not present

## 2016-12-11 DIAGNOSIS — E1159 Type 2 diabetes mellitus with other circulatory complications: Secondary | ICD-10-CM | POA: Diagnosis not present

## 2016-12-11 DIAGNOSIS — Z794 Long term (current) use of insulin: Secondary | ICD-10-CM

## 2016-12-11 DIAGNOSIS — IMO0002 Reserved for concepts with insufficient information to code with codable children: Secondary | ICD-10-CM

## 2016-12-11 MED ORDER — LEVOTHYROXINE SODIUM 50 MCG PO TABS: 50.0000 ug | ORAL_TABLET | Freq: Every day | ORAL | 3 refills | Status: DC

## 2016-12-11 MED ORDER — FREESTYLE LIBRE READER DEVI: 1.0000 | Freq: Once | 0 refills | Status: AC

## 2016-12-11 MED ORDER — FREESTYLE LIBRE SENSOR SYSTEM MISC: 2 refills | Status: DC

## 2016-12-11 NOTE — Patient Instructions (Signed)

## 2016-12-11 NOTE — Progress Notes (Signed)
Subjective:    Patient ID: Ellen Woods, female    DOB: [birth year]. Patient is being seen in f/u for management of diabetes requested by  Mechele Claude, MD  Past Medical History:  Diagnosis Date  . Arthritis   . Back pain 1/14   Tx by orthopedics  . COPD (chronic obstructive pulmonary disease) (HCC)   . Coronary atherosclerosis of native coronary artery    50% LAD stenosis  . Encounter for long-term (current) use of insulin (HCC)   . Family history of anesthesia complication    Daughter has nausea  . PDA (patent ductus arteriosus)    Small PDA documented by CT angiography, no pulmonary hypertension  . Pericardial calcification    Ruled out for restrictive pericarditis  . Thyroid disease   . Type II or unspecified type diabetes mellitus without mention of complication, not stated as uncontrolled   . Unspecified essential hypertension    Past Surgical History:  Procedure Laterality Date  . ABDOMINAL HYSTERECTOMY     partial  . INCISIONAL HERNIA REPAIR    . INTRAMEDULLARY (IM) NAIL INTERTROCHANTERIC Left 11/13/2012   Procedure: INTRAMEDULLARY (IM) NAIL INTERTROCHANTRIC LEFT HIP;  Surgeon: Velna Ochs, MD;  Location: [facility] OR;  Service: Orthopedics;  Laterality: Left;  . TUBAL LIGATION     Social History   Social History  . Marital status: Widowed    Spouse name: N/A  . Number of children: 2  . Years of education: N/A   Social History Main Topics  . Smoking status: Never Smoker  . Smokeless tobacco: Never Used  . Alcohol use No  . Drug use: No  . Sexual activity: No   Other Topics Concern  . None   Social History Narrative  . None   Outpatient Encounter Prescriptions as of 12/11/2016  Medication Sig  . acetaminophen (TYLENOL) 325 MG tablet Take 2 tablets (650 mg total) by mouth every 6 (six) hours as needed.  . Alcohol Swabs (B-D SINGLE USE SWABS REGULAR) PADS USE FOR FINGERSTICK BLOOD SUGAR TESTING SIX TIMES DAILY  . ALPRAZolam (XANAX) 0.5 MG tablet Take  1 tablet (0.5 mg total) by mouth 2 (two) times daily as needed.  Marland Kitchen amLODipine (NORVASC) 5 MG tablet Take 1 tablet (5 mg total) by mouth daily. For blood pressure  . aspirin 81 MG tablet Take 81 mg by mouth daily.  . Calcium Carb-Cholecalciferol (CALCIUM 500 + D3) 500-600 MG-UNIT TABS Take 1 tablet by mouth daily.  Marland Kitchen docusate sodium 100 MG CAPS Take 100 mg by mouth 2 (two) times daily.  . furosemide (LASIX) 20 MG tablet Take 0.5 tablets (10 mg total) by mouth daily.  . insulin aspart (NOVOLOG) 100 UNIT/ML FlexPen Inject 2-4 Units into the skin 3 (three) times daily with meals.  . Insulin Glargine (LANTUS SOLOSTAR) 100 UNIT/ML Solostar Pen Inject 12 Units into the skin daily with breakfast.  . levothyroxine (SYNTHROID, LEVOTHROID) 50 MCG tablet Take 1 tablet (50 mcg total) by mouth daily before breakfast.  . lisinopril (PRINIVIL,ZESTRIL) 40 MG tablet Take 1 tablet (40 mg total) by mouth daily.  . metoprolol tartrate (LOPRESSOR) 25 MG tablet TAKE 1 TABLET TWICE DAILY  . Multiple Vitamin (MULTIVITAMIN) tablet Take 1 tablet by mouth daily.  . naproxen (NAPROSYN) 500 MG tablet Take 1 tablet (500 mg total) by mouth 2 (two) times daily with a meal. For hip (and other joint) pain  . NITROSTAT 0.4 MG SL tablet DISSOLVE  1 TABLET  UNDER THE TONGUE EVERY  5 MINUTES AS NEEDED FOR CHEST PAIN  . potassium chloride SA (K-DUR,KLOR-CON) 20 MEQ tablet TAKE 1 TABLET EVERY DAY  . PROLIA 60 MG/ML SOLN injection Inject 60 mg into the skin every 6 (six) months. To be administered 08/20/2016  . rosuvastatin (CRESTOR) 5 MG tablet Take 1 tablet (5 mg total) by mouth daily.  . [DISCONTINUED] levothyroxine (SYNTHROID, LEVOTHROID) 25 MCG tablet TAKE 1 TABLET (25 MCG TOTAL) BY MOUTH DAILY BEFORE BREAKFAST (NEW DOSE).  . Continuous Blood Gluc Receiver (FREESTYLE LIBRE READER) DEVI 1 Piece by Does not apply route once.  . Continuous Blood Gluc Sensor (FREESTYLE LIBRE SENSOR SYSTEM) MISC Use one sensor every 10 days.   No  facility-administered encounter medications on file as of 12/11/2016.    ALLERGIES: Allergies  Allergen Reactions  . Azithromycin Other (See Comments)    Hospital reaction  . Codeine     REACTION: nausea  . Invokana [Canagliflozin]    VACCINATION STATUS: Immunization History  Administered Date(s) Administered  . Influenza, High Dose Seasonal PF 02/21/2016  . Influenza,inj,Quad PF,36+ Mos 03/02/2014, 02/23/2015  . Influenza-Unspecified 02/21/2013  . Pneumococcal Conjugate-13 03/02/2014  . Zoster 09/02/2013    Diabetes  She presents for her follow-up diabetic visit. She has type 2 diabetes mellitus. Onset time: She was diagnosed at approximate age of 35 years. Her disease course has been stable. There are no hypoglycemic associated symptoms. Pertinent negatives for hypoglycemia include no confusion, headaches, pallor or seizures. There are no diabetic associated symptoms. Pertinent negatives for diabetes include no chest pain and no polyphagia. There are no hypoglycemic complications. Symptoms are stable. Diabetic complications include heart disease and PVD. Risk factors for coronary artery disease include diabetes mellitus and hypertension. Current diabetic treatment includes insulin injections (She is taking 16 units of Lantus in the morning and NovoLog 3-10 units with meals.). Her weight is stable. She is following a generally unhealthy diet. When asked about meal planning, she reported none. She has not had a previous visit with a dietitian. She participates in exercise intermittently. Her home blood glucose trend is fluctuating minimally (Her logs show that she has less frequent hypoglycemia than before, however she made insulin dosing errors.). Her breakfast blood glucose range is generally 140-180 mg/dl. Her lunch blood glucose range is generally 140-180 mg/dl. Her dinner blood glucose range is generally 140-180 mg/dl. Her overall blood glucose range is 140-180 mg/dl. An ACE  inhibitor/angiotensin II receptor blocker is being taken. Eye exam is current.  Hypertension  This is a chronic problem. The current episode started more than 1 year ago. The problem is controlled. Pertinent negatives include no chest pain, headaches, palpitations or shortness of breath. Risk factors for coronary artery disease include diabetes mellitus. Past treatments include beta blockers. Hypertensive end-organ damage includes PVD.     Review of Systems  Constitutional: Negative for unexpected weight change.  HENT: Negative for trouble swallowing and voice change.   Eyes: Negative for visual disturbance.  Respiratory: Negative for cough, shortness of breath and wheezing.   Cardiovascular: Negative for chest pain, palpitations and leg swelling.  Gastrointestinal: Negative for diarrhea, nausea and vomiting.  Endocrine: Negative for cold intolerance, heat intolerance and polyphagia.  Genitourinary: Positive for frequency. Negative for dysuria and flank pain.  Musculoskeletal: Negative for arthralgias and myalgias.  Skin: Negative for color change, pallor, rash and wound.  Neurological: Negative for seizures and headaches.  Psychiatric/Behavioral: Negative for confusion and suicidal ideas.    Objective:    BP 121/70   Pulse 80  Wt 113 lb (51.3 kg)   SpO2 99%   BMI 22.82 kg/m   Wt Readings from Last 3 Encounters:  12/11/16 113 lb (51.3 kg)  12/08/16 114 lb (51.7 kg)  11/04/16 116 lb (52.6 kg)    Physical Exam  Constitutional: She is oriented to person, place, and time. She appears well-developed.  HENT:  Head: Normocephalic and atraumatic.  Eyes: EOM are normal.  Neck: Normal range of motion. Neck supple. No tracheal deviation present. No thyromegaly present.  Cardiovascular: Normal rate and regular rhythm.   Pulmonary/Chest: Effort normal and breath sounds normal.  Abdominal: Soft. Bowel sounds are normal. There is no tenderness. There is no guarding.  Musculoskeletal:  Normal range of motion. She exhibits no edema.  Neurological: She is alert and oriented to person, place, and time. She has normal reflexes. No cranial nerve deficit. Coordination normal.  Skin: Skin is warm and dry. No rash noted. No erythema. No pallor.  Psychiatric: She has a normal mood and affect. Judgment normal.    CMP     Component Value Date/Time   NA 141 12/04/2016 0909   K 4.5 12/04/2016 0909   CL 101 12/04/2016 0909   CO2 24 12/04/2016 0909   GLUCOSE 287 (H) 12/04/2016 0909   GLUCOSE 134 (H) 03/17/2014 0440   BUN 27 12/04/2016 0909   CREATININE 1.03 (H) 12/04/2016 0909   CREATININE 0.99 10/25/2012 0952   CALCIUM 9.0 12/04/2016 0909   PROT 6.7 12/04/2016 0909   ALBUMIN 4.3 12/04/2016 0909   AST 25 12/04/2016 0909   ALT 18 12/04/2016 0909   ALKPHOS 95 12/04/2016 0909   BILITOT 0.5 12/04/2016 0909   GFRNONAA 53 (L) 12/04/2016 0909   GFRNONAA 58 (L) 10/25/2012 0952   GFRAA 61 12/04/2016 0909   GFRAA 66 10/25/2012 0952    Diabetic Labs (most recent): Lab Results  Component Value Date   HGBA1C 8.1 (H) 09/03/2016   HGBA1C 7 04/25/2016   HGBA1C 6.8 (H) 01/18/2016     Lipid Panel ( most recent) Lipid Panel     Component Value Date/Time   CHOL 226 (H) 05/29/2015 1125   TRIG 102 05/29/2015 1125   TRIG 148 07/21/2014 1305   HDL 74 05/29/2015 1125   HDL 73 07/21/2014 1305   CHOLHDL 3.1 05/29/2015 1125   CHOLHDL 3.2 10/25/2012 0952   VLDL 18 10/25/2012 0952   LDLCALC 132 (H) 05/29/2015 1125      Assessment & Plan:   1. Uncontrolled type 2 diabetes mellitus with complication, with long-term current use of insulin (HCC)  - Patient has currently uncontrolled symptomatic type 2 DM since  76 years of age. She Came with slightly better however still fluctuating blood glucose profile. She does have regular random hypoglycemia, mainly associated with inadequate oral intake or misdemeanors. Her recent  Labs did not include A1c. - During her last visit A1c was  8.1%.  Recent labs reviewed.    her diabetes is complicated by coronary artery disease and peripheral arterial disease and patient remains at a high risk for more acute and chronic complications of diabetes which include CAD, CVA, CKD, retinopathy, and neuropathy. These are all discussed in detail with the patient.  - I have counseled the patient on diet management  by adopting a carbohydrate restricted/protein rich diet.  - Suggestion is made for patient to avoid simple carbohydrates   from her diet including Cakes , Desserts, Ice Cream,  Soda (  diet and regular) , Sweet Tea , Candies,  Chips, Cookies, Artificial Sweeteners,   and "Sugar-free" Products . This will help patient to have stable blood glucose profile and potentially avoid unintended weight gain.  - I encouraged the patient to switch to  unprocessed or minimally processed complex starch and increased protein intake (animal or plant source), fruits, and vegetables.  - Patient is advised to stick to a routine mealtimes to eat 3 meals  a day and avoid unnecessary snacks ( to snack only to correct hypoglycemia).   - I have approached patient with the following individualized plan to manage diabetes and patient agrees:   - The primary objective in the care of this patient is to avoid hypoglycemia.  - I will continue basal insulin Lantus  15 units  every morning at 8 AM with breakfast, continue  her prandial insulin NovoLog to 2-4 units 3 times a day before meals  for pre-meal BG readings of 90-150mg /dl, plus patient specific correction dose for unexpected hyperglycemia above 150mg /dl, associated with strict monitoring of glucose  AC and HS. - Patient is warned not to take insulin without proper monitoring per orders. -Adjustment parameters are given for hypo and hyperglycemia in writing. -Patient is encouraged to call clinic for blood glucose levels less than 70 or above 300 mg /dl. -She reports that she is getting some help from her  daughter was not accompanying her today.  -  She is not a good candidate for SGLT2 I,  Incretin therapy, but will be considered for low-dose metformin if she continues to have normal renal function.   - Patient specific target  A1c;  LDL, HDL, Triglycerides, and  Waist Circumference were discussed in detail.  2) BP/HTN: Controlled. Continue current medications including ACEI/ARB. 3) Lipids/HPL:  Uncontrolled, LDL 132,  she will be considered for  statins. 4)  Weight/Diet: CDE Consult will be initiated , exercise, and detailed carbohydrates information provided.  5) hypothyroidism: - She would benefit from slight increase in her levothyroxine. I'll prescribe levothyroxine 50 g by mouth every morning.  - We discussed about correct intake of levothyroxine, at fasting, with water, separated by at least 30 minutes from breakfast, and separated by more than 4 hours from calcium, iron, multivitamins, acid reflux medications (PPIs). -Patient is made aware of the fact that thyroid hormone replacement is needed for life, dose to be adjusted by periodic monitoring of thyroid function tests.  6) Chronic Care/Health Maintenance:  - She is not on ACEI/ARB nor Statin medications, encouraged to continue to follow up with Ophthalmology, Podiatrist at least yearly or according to recommendations, and advised to   stay away from smoking. I have recommended yearly flu vaccine and pneumonia vaccination at least every 5 years; moderate intensity exercise for up to 150 minutes weekly; and  sleep for at least 7 hours a day.  - 20 minutes of time was spent on the care of this patient , 50% of which was applied for counseling on diabetes complications and their preventions.  - Patient to bring meter and  blood glucose logs during her next visit.   - I advised patient to maintain close follow up with Mechele Claude, MD for primary care needs.  Follow up plan: - Return in about 3 months (around 03/13/2017) for  follow up with pre-visit labs, meter, and logs.  Marquis Lunch, MD Phone: [phone]  Fax: [phone]   12/11/2016, 2:26 PM

## 2016-12-15 ENCOUNTER — Other Ambulatory Visit: Payer: Self-pay | Admitting: "Endocrinology

## 2016-12-16 ENCOUNTER — Telehealth: Payer: Self-pay | Admitting: *Deleted

## 2016-12-16 ENCOUNTER — Encounter (HOSPITAL_COMMUNITY)
Admission: RE | Admit: 2016-12-16 | Discharge: 2016-12-16 | Disposition: A | Discharge status: Home / Self Care | Payer: Medicare HMO | Source: Ambulatory Visit | Attending: Cardiovascular Disease | Admitting: Cardiovascular Disease

## 2016-12-16 ENCOUNTER — Encounter (HOSPITAL_COMMUNITY): Payer: Self-pay

## 2016-12-16 DIAGNOSIS — R9431 Abnormal electrocardiogram [ECG] [EKG]: Secondary | ICD-10-CM | POA: Insufficient documentation

## 2016-12-16 DIAGNOSIS — I209 Angina pectoris, unspecified: Secondary | ICD-10-CM | POA: Insufficient documentation

## 2016-12-16 LAB — NM MYOCAR MULTI W/SPECT W/WALL MOTION / EF
CHL CUP NUCLEAR SDS: 3
CHL CUP NUCLEAR SRS: 1
CHL CUP RESTING HR STRESS: 77 {beats}/min
CSEPPHR: 95 {beats}/min
LHR: 0.37
LV dias vol: 43 mL (ref 46–106)
LVSYSVOL: 9 mL
SSS: 4
TID: 0.9

## 2016-12-16 MED ORDER — SODIUM CHLORIDE 0.9% FLUSH
INTRAVENOUS | Status: AC
  Administered 2016-12-16: 10 mL via INTRAVENOUS
  Filled 2016-12-16: qty 10

## 2016-12-16 MED ORDER — TECHNETIUM TC 99M TETROFOSMIN IV KIT
30.0000 | PACK | Freq: Once | INTRAVENOUS | Status: AC | PRN
  Administered 2016-12-16: 30 via INTRAVENOUS

## 2016-12-16 MED ORDER — REGADENOSON 0.4 MG/5ML IV SOLN
INTRAVENOUS | Status: AC
  Administered 2016-12-16: 0.4 mg via INTRAVENOUS
  Filled 2016-12-16: qty 5

## 2016-12-16 MED ORDER — TECHNETIUM TC 99M TETROFOSMIN IV KIT
10.0000 | PACK | Freq: Once | INTRAVENOUS | Status: AC | PRN
  Administered 2016-12-16: 10 via INTRAVENOUS

## 2016-12-16 NOTE — Telephone Encounter (Signed)
Notes recorded by Lesle ChrisHill, Storm Sovine G, LPN on 0/45/40988/14/2018 at 5:51 PM EDT Patient notified. Copy to pmd. Follow up already scheduled with Dr. Antoine PocheHochrein [place]. ------  Notes recorded by Laqueta LindenKoneswaran, Suresh A, MD on 12/16/2016 at 3:19 PM EDT Low risk, no blockages.

## 2016-12-22 ENCOUNTER — Other Ambulatory Visit: Payer: Self-pay | Admitting: Family Medicine

## 2016-12-24 ENCOUNTER — Other Ambulatory Visit: Payer: Self-pay | Admitting: Family Medicine

## 2017-01-13 ENCOUNTER — Encounter: Payer: Self-pay | Admitting: *Deleted

## 2017-01-16 ENCOUNTER — Other Ambulatory Visit: Payer: Self-pay | Admitting: Family Medicine

## 2017-01-21 ENCOUNTER — Ambulatory Visit: Payer: Medicare HMO | Admitting: Family Medicine

## 2017-01-21 ENCOUNTER — Encounter: Payer: Self-pay | Admitting: Family Medicine

## 2017-01-21 ENCOUNTER — Encounter (INDEPENDENT_AMBULATORY_CARE_PROVIDER_SITE_OTHER): Payer: Self-pay

## 2017-01-21 ENCOUNTER — Ambulatory Visit (INDEPENDENT_AMBULATORY_CARE_PROVIDER_SITE_OTHER): Payer: Medicare HMO | Admitting: Family Medicine

## 2017-01-21 VITALS — BP 137/74 | HR 86 | Temp 98.1°F | Ht 59.0 in | Wt 120.0 lb

## 2017-01-21 DIAGNOSIS — H9311 Tinnitus, right ear: Secondary | ICD-10-CM | POA: Diagnosis not present

## 2017-01-21 DIAGNOSIS — M1652 Unilateral post-traumatic osteoarthritis, left hip: Secondary | ICD-10-CM

## 2017-01-21 MED ORDER — MELOXICAM 15 MG PO TABS: 15.0000 mg | ORAL_TABLET | Freq: Every day | ORAL | 2 refills | Status: DC

## 2017-01-21 NOTE — Progress Notes (Signed)
BP 137/74   Pulse 86   Temp 98.1 F (36.7 C) (Oral)   Ht  (1.499 m)   Wt 120 lb (54.4 kg)   BMI 24.24 kg/m    Subjective:    Patient ID: Ellen Woods, female    DOB: Jul 16, 1940, 76 y.o.   MRN: [id number]  HPI: Ellen Woods is a 76 y.o. female presenting on 01/21/2017 for Establish Care (was seeing another PCP and would like to switch to you)   HPI Ringing in ears Patient comes in complaining of ringing in her right ear that's been going on increasingly over the past couple months. She denies any drainage or fevers or chills. She doesn't think she's had any hearing loss. She denies any pain or sinus congestion or drainage.  Right hip pain Patient has right hip pain. She had a major accident in the distant past that has led to her right hip pain that she gets intermittently. She has been diagnosed with arthritis of the hip. She was taking naproxen but would like to try something stronger oral. We discussed possibility of doing a referral to orthopedic but she would like to try something first and then see about that in the future. She says the pain is mild to moderate and comes and goes and has been hurting her this time over the past week.  Relevant past medical, surgical, family and social history reviewed and updated as indicated. Interim medical history since our last visit reviewed. Allergies and medications reviewed and updated.  Review of Systems  Constitutional: Negative for chills and fever.  HENT: Positive for tinnitus. Negative for congestion, rhinorrhea, sinus pain, sinus pressure and sore throat.   Eyes: Negative for visual disturbance.  Respiratory: Negative for cough, chest tightness, shortness of breath and wheezing.   Cardiovascular: Negative for chest pain and leg swelling.  Musculoskeletal: Positive for arthralgias. Negative for back pain, gait problem and joint swelling.  Skin: Negative for rash.  Neurological: Negative for light-headedness and  headaches.  Psychiatric/Behavioral: Negative for agitation and behavioral problems.  All other systems reviewed and are negative.   Per HPI unless specifically indicated above        Objective:    BP 137/74   Pulse 86   Temp 98.1 F (36.7 C) (Oral)   Ht  (1.499 m)   Wt 120 lb (54.4 kg)   BMI 24.24 kg/m   Wt Readings from Last 3 Encounters:  01/21/17 120 lb (54.4 kg)  12/11/16 113 lb (51.3 kg)  12/08/16 114 lb (51.7 kg)    Physical Exam  Constitutional: She is oriented to person, place, and time. She appears well-developed and well-nourished. No distress.  HENT:  Right Ear: Tympanic membrane, external ear and ear canal normal.  Left Ear: Tympanic membrane, external ear and ear canal normal.  Nose: Nose normal.  Mouth/Throat: Oropharynx is clear and moist and mucous membranes are normal. No oropharyngeal exudate.  Eyes: Conjunctivae are normal.  Cardiovascular: Normal rate, regular rhythm, normal heart sounds and intact distal pulses.   No murmur heard. Pulmonary/Chest: Effort normal and breath sounds normal. No respiratory distress. She has no wheezes. She has no rales.  Musculoskeletal: Normal range of motion. She exhibits tenderness. She exhibits no edema.       Right hip: She exhibits tenderness (Pain with internal and external rotation of the hip. Negative straight leg raise bilaterally. No lower back pain on exam.). She exhibits normal range of motion, normal strength, no bony  tenderness, no swelling and no deformity.  Neurological: She is alert and oriented to person, place, and time. Coordination normal.  Skin: Skin is warm and dry. No rash noted. She is not diaphoretic.  Psychiatric: She has a normal mood and affect. Her behavior is normal.  Nursing note and vitals reviewed.       Assessment & Plan:   Problem List Items Addressed This Visit    None    Visit Diagnoses    Ringing in ears, right    -  Primary   Mostly at night, recommended using white  noise at night to cancel it out, it wants to in future will go to audiology   Post-traumatic osteoarthritis of left hip       Relevant Medications   meloxicam (MOBIC) 15 MG tablet       Follow up plan: Return in about 6 months (around 07/21/2017), or if symptoms worsen or fail to improve, for Hypertension recheck.  Counseling provided for all of the vaccine components No orders of the defined types were placed in this encounter.   Arville Care, MD [facility] 01/21/2017, 4:45 PM

## 2017-01-26 ENCOUNTER — Telehealth: Payer: Self-pay | Admitting: Family Medicine

## 2017-01-26 DIAGNOSIS — H9311 Tinnitus, right ear: Secondary | ICD-10-CM

## 2017-01-26 NOTE — Telephone Encounter (Signed)
What type of referral do you need? ent  Have you been seen at our office for this problem? yes (If no, schedule them an appointment.  They will need to be seen before a referral can be done.)  Is there a particular doctor or location that you prefer? Closest to home if part of [facility].   Patient notified that referrals can take up to a week or longer to process. If they haven't heard anything within a week they should call back and speak with the referral department.

## 2017-01-26 NOTE — Telephone Encounter (Signed)
Aware of referral

## 2017-02-03 ENCOUNTER — Other Ambulatory Visit: Payer: Self-pay | Admitting: "Endocrinology

## 2017-02-03 ENCOUNTER — Other Ambulatory Visit: Payer: Self-pay | Admitting: Family Medicine

## 2017-02-03 NOTE — Progress Notes (Signed)
Cardiology Office Note   Date:  02/04/2017   ID:  Ellen Woods, DOB Oct 21, 1940, MRN [id number]  PCP:  Ellen Woods, Ellen Radon, MD  Cardiologist:   Rollene Rotunda, MD    No chief complaint on file.     History of Present Illness: Ellen Woods is a 76 y.o. female who presents for follow up of chest pain.    She had non obstructive disease on cath in 2012.  She saw Dr. Purvis Sheffield in August for chest pain and she had a negative Lexiscan Myoview.  This was a low risk result with an EF of 78%.  Since that time she has no new symptoms.  She does have DOE but this is improved.  The patient denies any new symptoms such as chest discomfort, neck or arm discomfort. There has been no new PND or orthopnea. There have been no reported palpitations, presyncope or syncope.  Past Medical History:  Diagnosis Date  . Arthritis   . Back pain 1/14   Tx by orthopedics  . COPD (chronic obstructive pulmonary disease) (HCC)   . Coronary atherosclerosis of native coronary artery    50% LAD stenosis  . Encounter for long-term (current) use of insulin (HCC)   . Family history of anesthesia complication    Daughter has nausea  . PDA (patent ductus arteriosus)    Small PDA documented by CT angiography, no pulmonary hypertension  . Pericardial calcification    Ruled out for restrictive pericarditis  . Thyroid disease   . Type II or unspecified type diabetes mellitus without mention of complication, not stated as uncontrolled   . Unspecified essential hypertension     Past Surgical History:  Procedure Laterality Date  . ABDOMINAL HYSTERECTOMY     partial  . INCISIONAL HERNIA REPAIR    . INTRAMEDULLARY (IM) NAIL INTERTROCHANTERIC Left 11/13/2012   Procedure: INTRAMEDULLARY (IM) NAIL INTERTROCHANTRIC LEFT HIP;  Surgeon: Velna Ochs, MD;  Location: [facility] OR;  Service: Orthopedics;  Laterality: Left;  . TUBAL LIGATION       Current Outpatient Prescriptions  Medication Sig Dispense Refill  .  acetaminophen (TYLENOL) 325 MG tablet Take 2 tablets (650 mg total) by mouth every 6 (six) hours as needed.    . ALPRAZolam (XANAX) 0.5 MG tablet Take 1 tablet (0.5 mg total) by mouth 2 (two) times daily as needed. 180 tablet 0  . amLODipine (NORVASC) 5 MG tablet Take 1 tablet (5 mg total) by mouth daily. For blood pressure 30 tablet 5  . aspirin 81 MG tablet Take 81 mg by mouth daily.    . Calcium Carb-Cholecalciferol (CALCIUM 500 + D3) 500-600 MG-UNIT TABS Take 1 tablet by mouth daily. 60 tablet   . docusate sodium 100 MG CAPS Take 100 mg by mouth 2 (two) times daily. 60 capsule 0  . furosemide (LASIX) 20 MG tablet Take 0.5 tablets (10 mg total) by mouth daily. 15 tablet 2  . furosemide (LASIX) 40 MG tablet 40 mg. Take one tablet by mouth as needed    . Insulin Glargine (LANTUS SOLOSTAR) 100 UNIT/ML Solostar Pen Inject 12 Units into the skin daily with breakfast. 5 pen 2  . KLOR-CON M20 20 MEQ tablet TAKE 1 TABLET EVERY DAY 90 tablet 1  . levothyroxine (SYNTHROID, LEVOTHROID) 50 MCG tablet Take 1 tablet (50 mcg total) by mouth daily before breakfast. 30 tablet 3  . lisinopril (PRINIVIL,ZESTRIL) 40 MG tablet Take 1 tablet (40 mg total) by mouth daily. 30 tablet 6  .  metoprolol tartrate (LOPRESSOR) 25 MG tablet TAKE 1 TABLET TWICE DAILY 180 tablet 1  . Multiple Vitamin (MULTIVITAMIN) tablet Take 1 tablet by mouth daily.    Marland Kitchen NITROSTAT 0.4 MG SL tablet DISSOLVE  1 TABLET  UNDER THE TONGUE EVERY 5 MINUTES AS NEEDED FOR CHEST PAIN 25 tablet 0  . NOVOLOG FLEXPEN 100 UNIT/ML FlexPen INJECT 3 TO 5 UNITS SUBCUTANEOUSLY THREE TIMES DAILY WITH MEALS 15 mL 3  . PROLIA 60 MG/ML SOLN injection Inject 60 mg into the skin every 6 (six) months. To be administered 08/20/2016 1 each 0  . rosuvastatin (CRESTOR) 5 MG tablet Take 1 tablet (5 mg total) by mouth daily. 30 tablet 6  . Alcohol Swabs (B-D SINGLE USE SWABS REGULAR) PADS USE FOR FINGERSTICK BLOOD SUGAR TESTING SIX TIMES DAILY 600 each 2  . Continuous Blood  Gluc Sensor (FREESTYLE LIBRE SENSOR SYSTEM) MISC Use one sensor every 10 days. 3 each 2   No current facility-administered medications for this visit.     Allergies:   Codeine; Invokana [canagliflozin]; Naproxen; and Azithromycin    ROS:  Please see the history of present illness.   Otherwise, review of systems are positive for insomnia and ringing in her ears. .   All other systems are reviewed and negative.    PHYSICAL EXAM: VS:  BP 120/68   Pulse 78   Ht  (1.499 m)   Wt 120 lb (54.4 kg)   BMI 24.24 kg/m  , BMI Body mass index is 24.24 kg/m. GENERAL:  Well appearing NECK:  No jugular venous distention, waveform within normal limits, carotid upstroke brisk and symmetric, no bruits, no thyromegaly LUNGS:  Clear to auscultation bilaterally CHEST:  Unremarkable HEART:  PMI not displaced or sustained,S1 and S2 within normal limits, no S3, no S4, no clicks, no rubs, no murmurs ABD:  Flat, positive bowel sounds normal in frequency in pitch, no bruits, no rebound, no guarding, no midline pulsatile mass, no hepatomegaly, no splenomegaly EXT:  2 plus pulses throughout, no edema, no cyanosis no clubbing    EKG:  EKG is not ordered today.    Recent Labs: 09/03/2016: Hemoglobin WILL FOLLOW; Platelets WILL FOLLOW 12/04/2016: ALT 18; BUN 27; Creatinine, Ser 1.03; Potassium 4.5; Sodium 141; TSH 5.290    Lipid Panel    Component Value Date/Time   CHOL 226 (H) 05/29/2015 1125   TRIG 102 05/29/2015 1125   TRIG 148 07/21/2014 1305   HDL 74 05/29/2015 1125   HDL 73 07/21/2014 1305   CHOLHDL 3.1 05/29/2015 1125   CHOLHDL 3.2 10/25/2012 0952   VLDL 18 10/25/2012 0952   LDLCALC 132 (H) 05/29/2015 1125      Wt Readings from Last 3 Encounters:  02/04/17 120 lb (54.4 kg)  01/21/17 120 lb (54.4 kg)  12/11/16 113 lb (51.3 kg)      Other studies Reviewed: Additional studies/ records that were reviewed today include: Lexiscan Myoview.. Review of the above records demonstrates:   Please see elsewhere in the note.     ASSESSMENT AND PLAN:   CAD:  She had atypical symptoms and a negative perfusion study.  No further testing is indicated.    HTN:  The blood pressure is at target. No change in medications is indicated. We will continue with therapeutic lifestyle changes (TLC).  DYSLIPIDEMIA:   She was started on Crestor 8 weeks ago.  She will get a repeat liver profile.    DM:  Her sugar was not at target but I  will defer to Ellen Woods, Ellen Radon, MD  Current medicines are reviewed at length with the patient today.  The patient does not have concerns regarding medicines.  The following changes have been made:  no change  Labs/ tests ordered today include: None No orders of the defined types were placed in this encounter.    Disposition:   FU with me as needed.      Signed, Rollene Rotunda, MD  02/04/2017 11:57 AM    [facility]

## 2017-02-04 ENCOUNTER — Ambulatory Visit (INDEPENDENT_AMBULATORY_CARE_PROVIDER_SITE_OTHER): Payer: Medicare HMO | Admitting: Cardiology

## 2017-02-04 ENCOUNTER — Ambulatory Visit (INDEPENDENT_AMBULATORY_CARE_PROVIDER_SITE_OTHER): Payer: Medicare HMO

## 2017-02-04 ENCOUNTER — Encounter: Payer: Self-pay | Admitting: Cardiology

## 2017-02-04 VITALS — BP 120/68 | HR 78 | Ht 59.0 in | Wt 120.0 lb

## 2017-02-04 DIAGNOSIS — Z23 Encounter for immunization: Secondary | ICD-10-CM

## 2017-02-04 DIAGNOSIS — I10 Essential (primary) hypertension: Secondary | ICD-10-CM | POA: Diagnosis not present

## 2017-02-04 DIAGNOSIS — I2510 Atherosclerotic heart disease of native coronary artery without angina pectoris: Secondary | ICD-10-CM | POA: Diagnosis not present

## 2017-02-04 DIAGNOSIS — E785 Hyperlipidemia, unspecified: Secondary | ICD-10-CM

## 2017-02-04 NOTE — Patient Instructions (Signed)
Medication Instructions:  The current medical regimen is effective;  continue present plan and medications.  Labwork: Please have fasting blood work at your primary care doctor's office.  Follow-Up: Follow up as needed with Dr Antoine Poche in [place].  Thank you for choosing [facility]!!

## 2017-02-05 ENCOUNTER — Encounter: Payer: Self-pay | Admitting: Cardiology

## 2017-02-10 DIAGNOSIS — H6121 Impacted cerumen, right ear: Secondary | ICD-10-CM | POA: Diagnosis not present

## 2017-02-10 DIAGNOSIS — H903 Sensorineural hearing loss, bilateral: Secondary | ICD-10-CM | POA: Diagnosis not present

## 2017-02-10 DIAGNOSIS — H938X2 Other specified disorders of left ear: Secondary | ICD-10-CM | POA: Diagnosis not present

## 2017-02-10 DIAGNOSIS — J343 Hypertrophy of nasal turbinates: Secondary | ICD-10-CM | POA: Diagnosis not present

## 2017-02-10 DIAGNOSIS — H9313 Tinnitus, bilateral: Secondary | ICD-10-CM | POA: Diagnosis not present

## 2017-02-16 ENCOUNTER — Other Ambulatory Visit: Payer: Self-pay | Admitting: "Endocrinology

## 2017-02-17 DIAGNOSIS — H903 Sensorineural hearing loss, bilateral: Secondary | ICD-10-CM | POA: Diagnosis not present

## 2017-02-23 ENCOUNTER — Other Ambulatory Visit: Payer: Self-pay | Admitting: "Endocrinology

## 2017-02-23 ENCOUNTER — Ambulatory Visit (INDEPENDENT_AMBULATORY_CARE_PROVIDER_SITE_OTHER): Payer: Medicare HMO | Admitting: *Deleted

## 2017-02-23 ENCOUNTER — Encounter: Payer: Self-pay | Admitting: *Deleted

## 2017-02-23 VITALS — BP 132/85 | HR 108 | Ht 59.0 in | Wt 124.0 lb

## 2017-02-23 DIAGNOSIS — M810 Age-related osteoporosis without current pathological fracture: Secondary | ICD-10-CM

## 2017-02-23 DIAGNOSIS — Z0000 Encounter for general adult medical examination without abnormal findings: Secondary | ICD-10-CM | POA: Diagnosis not present

## 2017-02-23 NOTE — Patient Instructions (Addendum)
  Ms. Ellen Woods , Thank you for taking time to come for your Medicare Wellness Visit. I appreciate your ongoing commitment to your health goals. Please review the following plan we discussed and let me know if I can assist you in the future.   These are the goals we discussed: Goals    . Exercise 150 minutes per week (moderate activity)          Do chair exercises daily. See handout.        This is a list of the screening recommended for you and due dates:  Health Maintenance  Topic Date Due  . Tetanus Vaccine  10/03/2013  . Complete foot exam   07/21/2015  . DEXA scan (bone density measurement)  02/20/2017  . Hemoglobin A1C  03/06/2017  . Eye exam for diabetics  03/06/2017  . Pneumonia vaccines (2 of 2 - PPSV23) 12/20/2017  . Flu Shot  Completed   Dexa scan ordered Follow up with Dr Louanne Skyeettinger for depression and hip pain

## 2017-02-23 NOTE — Progress Notes (Signed)
Subjective:   Ellen Woods is a 76 y.o. female who presents for an Initial Medicare Annual Wellness Visit. [name] lives at home with her adult daughter.   Review of Systems    Health is about the same as last year.   Cardiac Risk Factors include: advanced age (>42men, >36 women);sedentary lifestyle;dyslipidemia;family history of premature cardiovascular disease;diabetes mellitus;hypertension  Musculoskeletal: Left hip pain/weakness s/p fx repair in 2014. States that her left leg is now shorter than the right.   Other systems negative.     Objective:    Today's Vitals   02/23/17 1115  BP: 132/85  Pulse: (!) 108  Weight: 124 lb (56.2 kg)  Height: 4\' 11"  (1.499 m)   Body mass index is 25.04 kg/m.   Current Medications (verified) Outpatient Encounter Prescriptions as of 02/23/2017  Medication Sig  . acetaminophen (TYLENOL) 325 MG tablet Take 2 tablets (650 mg total) by mouth every 6 (six) hours as needed.  . Alcohol Swabs (B-D SINGLE USE SWABS REGULAR) PADS USE FOR FINGERSTICK BLOOD SUGAR TESTING SIX TIMES DAILY  . ALPRAZolam (XANAX) 0.5 MG tablet Take 1 tablet (0.5 mg total) by mouth 2 (two) times daily as needed.  Marland Kitchen amLODipine (NORVASC) 5 MG tablet Take 1 tablet (5 mg total) by mouth daily. For blood pressure  . aspirin 81 MG tablet Take 81 mg by mouth daily.  . Calcium Carb-Cholecalciferol (CALCIUM 500 + D3) 500-600 MG-UNIT TABS Take 1 tablet by mouth daily.  Marland Kitchen docusate sodium 100 MG CAPS Take 100 mg by mouth 2 (two) times daily.  . furosemide (LASIX) 40 MG tablet 40 mg. Take one tablet by mouth as needed  . Insulin Glargine (LANTUS SOLOSTAR) 100 UNIT/ML Solostar Pen Inject 15 Units into the skin daily with breakfast.  . KLOR-CON M20 20 MEQ tablet TAKE 1 TABLET EVERY DAY  . levothyroxine (SYNTHROID, LEVOTHROID) 50 MCG tablet TAKE 1 TABLET EVERY DAY BEFORE BREAKFAST  . lisinopril (PRINIVIL,ZESTRIL) 40 MG tablet Take 1 tablet (40 mg total) by mouth daily.  .  metoprolol tartrate (LOPRESSOR) 25 MG tablet TAKE 1 TABLET TWICE DAILY  . Multiple Vitamin (MULTIVITAMIN) tablet Take 1 tablet by mouth daily.  Marland Kitchen NITROSTAT 0.4 MG SL tablet DISSOLVE  1 TABLET  UNDER THE TONGUE EVERY 5 MINUTES AS NEEDED FOR CHEST PAIN  . NOVOLOG FLEXPEN 100 UNIT/ML FlexPen INJECT 3 TO 5 UNITS SUBCUTANEOUSLY THREE TIMES DAILY WITH MEALS  . PROLIA 60 MG/ML SOLN injection Inject 60 mg into the skin every 6 (six) months. To be administered 08/20/2016  . rosuvastatin (CRESTOR) 5 MG tablet Take 1 tablet (5 mg total) by mouth daily.  . [DISCONTINUED] furosemide (LASIX) 20 MG tablet Take 0.5 tablets (10 mg total) by mouth daily.  . Continuous Blood Gluc Sensor (FREESTYLE LIBRE SENSOR SYSTEM) MISC Use one sensor every 10 days. (Patient not taking: Reported on 02/23/2017)   No facility-administered encounter medications on file as of 02/23/2017.     Allergies (verified) Codeine; Invokana [canagliflozin]; Naproxen; and Azithromycin   History: Past Medical History:  Diagnosis Date  . Arthritis   . Back pain 1/14   Tx by orthopedics  . COPD (chronic obstructive pulmonary disease) (HCC)   . Coronary atherosclerosis of native coronary artery    50% LAD stenosis  . Encounter for long-term (current) use of insulin (HCC)   . Family history of anesthesia complication    Daughter has nausea  . PDA (patent ductus arteriosus)    Small PDA documented by CT angiography,  no pulmonary hypertension  . Pericardial calcification    Ruled out for restrictive pericarditis  . Thyroid disease   . Type II or unspecified type diabetes mellitus without mention of complication, not stated as uncontrolled   . Unspecified essential hypertension    Past Surgical History:  Procedure Laterality Date  . ABDOMINAL HYSTERECTOMY     partial  . INCISIONAL HERNIA REPAIR    . INTRAMEDULLARY (IM) NAIL INTERTROCHANTERIC Left 11/13/2012   Procedure: INTRAMEDULLARY (IM) NAIL INTERTROCHANTRIC LEFT HIP;  Surgeon:  Velna Ochs, MD;  Location: [facility] OR;  Service: Orthopedics;  Laterality: Left;  . TUBAL LIGATION     Family History  Problem Relation Age of Onset  . Stroke Mother   . Hypertension Mother   . Diabetes Brother   . Early death Brother   . Heart disease Daughter   . Stroke Daughter   . Congenital heart disease Daughter   . Diabetes Son 23  . Stroke Brother   . Diabetes Brother 6       type 1   Social History   Occupational History  . Not on file.   Social History Main Topics  . Smoking status: Never Smoker  . Smokeless tobacco: Never Used  . Alcohol use No  . Drug use: No  . Sexual activity: No    Tobacco Counseling No tobacco use  Activities of Daily Living In your present state of health, do you have any difficulty performing the following activities: 02/23/2017  Hearing? N  Comment recent evaluation. hearing ok except for ringing  Vision? N  Difficulty concentrating or making decisions? N  Comment Noticed it was a little difficult to keep patient on track during the visit  Walking or climbing stairs? Y  Comment Left hip pain s/p left hip fracture repair in 2014  Dressing or bathing? N  Doing errands, shopping? N  Preparing Food and eating ? N  Using the Toilet? N  In the past six months, have you accidently leaked urine? N  Do you have problems with loss of bowel control? N  Managing your Medications? N  Managing your Finances? N  Housekeeping or managing your Housekeeping? N  Some recent data might be hidden    Immunizations and Health Maintenance Immunization History  Administered Date(s) Administered  . Influenza, High Dose Seasonal PF 02/21/2016, 02/04/2017  . Influenza,inj,Quad PF,6+ Mos 03/02/2014, 02/23/2015  . Influenza-Unspecified 02/21/2013  . Pneumococcal Conjugate-13 03/02/2014, 12/20/2016  . Zoster 09/02/2013   Health Maintenance Due  Topic Date Due  . TETANUS/TDAP  10/03/2013  . FOOT EXAM  07/21/2015  . DEXA SCAN  02/20/2017     Patient Care Team: Dettinger, Elige Radon, MD as PCP - General (Family Medicine) Roma Kayser, MD as Consulting Physician (Endocrinology) Rollene Rotunda, MD as Consulting Physician (Cardiology)  No hospitalizations, ER visits or surgeries this past year.      Assessment:   This is a routine wellness examination for Devetta.   Hearing/Vision screen No deficit noted during visit. Patient has had a recent hearing evaluation. Her eye exam is due.   Dietary issues and exercise activities discussed: Current Exercise Habits: The patient does not participate in regular exercise at present, Exercise limited by: orthopedic condition(s)   Diet: 3 meal a day  Goals    . Exercise 150 minutes per week (moderate activity)          Do chair exercises daily. See handout.       Depression Screen [facility] 2/9  Scores 02/23/2017 01/21/2017 11/04/2016 09/10/2016 06/10/2016 06/04/2016 04/25/2016  PHQ - 2 Score 4 0 3 0 0 0 0  PHQ- 9 Score 9 - 5 - - - -  Takes Xanax for anxiety. Not interested in treating depression at this time.    Fall Risk Fall Risk  02/23/2017 01/21/2017 11/04/2016 09/10/2016 06/10/2016  Falls in the past year? No No No No No  Number falls in past yr: - - - - -  Injury with Fall? - - - - -  Comment - - - - -  Risk for fall due to : History of fall(s);Impaired balance/gait - - - -  Follow up - - - - -    Cognitive Function: MMSE - Mini Mental State Exam 08/14/2015  Orientation to time 5  Orientation to Place 5  Registration 3  Attention/ Calculation 5  Recall 2  Language- name 2 objects 2  Language- repeat 1  Language- follow 3 step command 3  Language- read & follow direction 1  Write a sentence 1  Copy design 0  Total score 28        Screening Tests Health Maintenance  Topic Date Due  . TETANUS/TDAP  10/03/2013  . FOOT EXAM  07/21/2015  . DEXA SCAN  02/20/2017  . HEMOGLOBIN A1C  03/06/2017  . OPHTHALMOLOGY EXAM  03/06/2017  . PNA vac Low Risk Adult (2 of 2 -  PPSV23) 12/20/2017  . INFLUENZA VACCINE  Completed      Plan:   Dexa ordered and scheduled for next week. Technician unavailable today. Chair exercises given and explained. Do these daily.  F/u with Dr Dettinger if hip pain persists.  F/u on depression as needed Schedule eye exam  I have personally reviewed and noted the following in the patient's chart:   . Medical and social history . Use of alcohol, tobacco or illicit drugs  . Current medications and supplements . Functional ability and status . Nutritional status . Physical activity . Advanced directives . List of other physicians . Hospitalizations, surgeries, and ER visits in previous 12 months . Vitals . Screenings to include cognitive, depression, and falls . Referrals and appointments  In addition, I have reviewed and discussed with patient certain preventive protocols, quality metrics, and best practice recommendations. A written personalized care plan for preventive services as well as general preventive health recommendations were provided to patient.     Demetrios LollKristen Tandy Grawe, RN  02/23/2017

## 2017-02-24 ENCOUNTER — Other Ambulatory Visit: Payer: Self-pay | Admitting: *Deleted

## 2017-02-24 NOTE — Telephone Encounter (Signed)
Requesting refill on xanax. Please print and have patient pickup to send to mail order pharmacy. Last written 11/04/16 for 90 day supply.

## 2017-02-25 NOTE — Telephone Encounter (Signed)
Apt made 10/25 at 10:55 with Dr. Louanne Skyeettinger.

## 2017-02-25 NOTE — Telephone Encounter (Signed)
Patient needs to be seen for this, I have never seen her for this issue or prescribed this myself.  Looks like it was prescribed by Dr. Azucena KubaSacks previously.  We can give her enough to get through until she can come in and see me but it does not look like she even has a follow-up appointment at this point.

## 2017-02-26 ENCOUNTER — Ambulatory Visit (INDEPENDENT_AMBULATORY_CARE_PROVIDER_SITE_OTHER): Payer: Medicare HMO | Admitting: Family Medicine

## 2017-02-26 ENCOUNTER — Encounter: Payer: Self-pay | Admitting: Family Medicine

## 2017-02-26 VITALS — BP 132/82 | HR 153 | Temp 98.0°F | Ht 59.0 in | Wt 123.0 lb

## 2017-02-26 DIAGNOSIS — E785 Hyperlipidemia, unspecified: Secondary | ICD-10-CM

## 2017-02-26 DIAGNOSIS — E038 Other specified hypothyroidism: Secondary | ICD-10-CM

## 2017-02-26 DIAGNOSIS — E1165 Type 2 diabetes mellitus with hyperglycemia: Secondary | ICD-10-CM

## 2017-02-26 DIAGNOSIS — I10 Essential (primary) hypertension: Secondary | ICD-10-CM

## 2017-02-26 DIAGNOSIS — F339 Major depressive disorder, recurrent, unspecified: Secondary | ICD-10-CM | POA: Diagnosis not present

## 2017-02-26 MED ORDER — ALPRAZOLAM 0.5 MG PO TABS: 0.5000 mg | ORAL_TABLET | Freq: Two times a day (BID) | ORAL | 0 refills | Status: DC | PRN

## 2017-02-26 MED ORDER — DULOXETINE HCL 30 MG PO CPEP: 30.0000 mg | ORAL_CAPSULE | Freq: Every day | ORAL | 1 refills | Status: DC

## 2017-02-26 NOTE — Progress Notes (Signed)
BP 132/82   Pulse (!) 153   Temp 98 F (36.7 C) (Oral)   Ht 4' 11"  (1.499 m)   Wt 123 lb (55.8 kg)   BMI 24.84 kg/m    Subjective:    Patient ID: Ellen Woods, female    DOB: 1941/02/06, 76 y.o.   MRN: [id number]  HPI: Ellen Woods is a 76 y.o. female presenting on 02/26/2017 for Hypertension (pt here today for routine follow up of her chronic medical conditions and also c/o left leg pain)   HPI Type 2 diabetes mellitus Patient comes in today for recheck of his diabetes. Patient has been currently taking NovoLog 3-5 units with meals, Lantus 15 units daily, patient sees an endocrinologist who manages this.. Patient is currently on an ACE inhibitor/ARB. Patient has not seen an ophthalmologist this year. Patient denies any issues with their feet.   Hypertension Patient is currently on lisinopril and amlodipine, and their blood pressure today is 132/82. Patient denies any lightheadedness or dizziness. Patient denies headaches, blurred vision, chest pains, shortness of breath, or weakness. Denies any side effects from medication and is content with current medication.   Hyperlipidemia Patient is coming in for recheck of his hyperlipidemia. The patient is currently taking Crestor. They deny any issues with myalgias or history of liver damage from it. They deny any focal numbness or weakness or chest pain.   Hypothyroidism recheck Patient is coming in for thyroid recheck today as well. They deny any issues with hair changes or heat or cold problems or diarrhea or constipation. They deny any chest pain or palpitations. They are currently on levothyroxine 61mcrograms   Anxiety and depression The patient is coming in to discuss anxiety and depression as well today.  Patient has currently been taking Xanax 2-3 times daily and is not on any other medication.  She feels like her nerves are always stretched and she has needed more and more Xanax over the past years.  She denies any  suicidal ideations or thoughts of hurting herself but does feels like things are not well controlled would like to try other options but does not want to give up her Xanax.  She is also wary of other options and the reactions that he could possibly have with her.  Relevant past medical, surgical, family and social history reviewed and updated as indicated. Interim medical history since our last visit reviewed. Allergies and medications reviewed and updated.  Review of Systems  Constitutional: Negative for chills and fever.  Eyes: Negative for redness and visual disturbance.  Respiratory: Negative for chest tightness and shortness of breath.   Cardiovascular: Negative for chest pain and leg swelling.  Endocrine: Negative for cold intolerance and heat intolerance.  Genitourinary: Negative for difficulty urinating and dysuria.  Musculoskeletal: Negative for back pain and gait problem.  Skin: Negative for rash.  Neurological: Negative for dizziness, weakness, light-headedness, numbness and headaches.  Psychiatric/Behavioral: Positive for dysphoric mood and sleep disturbance. Negative for agitation, behavioral problems, decreased concentration, self-injury and suicidal ideas. The patient is nervous/anxious.   All other systems reviewed and are negative.   Per HPI unless specifically indicated above        Objective:    BP 132/82   Pulse (!) 153   Temp 98 F (36.7 C) (Oral)   Ht 4' 11"  (1.499 m)   Wt 123 lb (55.8 kg)   BMI 24.84 kg/m   Wt Readings from Last 3 Encounters:  02/26/17 123 lb (55.8 kg)  02/23/17 124 lb (56.2 kg)  02/04/17 120 lb (54.4 kg)    Physical Exam  Constitutional: She is oriented to person, place, and time. She appears well-developed and well-nourished. No distress.  Eyes: Conjunctivae are normal.  Neck: Neck supple. No thyromegaly present.  Cardiovascular: Normal rate, regular rhythm, normal heart sounds and intact distal pulses.   No murmur  heard. Pulmonary/Chest: Effort normal and breath sounds normal. No respiratory distress. She has no wheezes. She has no rales.  Abdominal: Soft. Bowel sounds are normal. She exhibits no distension. There is no tenderness.  Musculoskeletal: Normal range of motion. She exhibits no edema.  Lymphadenopathy:    She has no cervical adenopathy.  Neurological: She is alert and oriented to person, place, and time. Coordination normal.  Skin: Skin is warm and dry. No rash noted. She is not diaphoretic.  Psychiatric: Her behavior is normal. Judgment normal. Her mood appears anxious. She exhibits a depressed mood. She expresses no suicidal ideation. She expresses no suicidal plans.  Nursing note and vitals reviewed.     Assessment & Plan:   Problem List Items Addressed This Visit      Cardiovascular and Mediastinum   Essential hypertension, benign   Relevant Orders   CMP14+EGFR     Endocrine   Diabetes type 2, uncontrolled ([place])   Relevant Orders   Bayer DCA Hb A1c Waived   CMP14+EGFR   Hypothyroidism   Relevant Orders   TSH     Other   Hyperlipidemia with target LDL less than 100 - Primary   Relevant Orders   Lipid panel   Depression, recurrent (HCC)   Relevant Medications   DULoxetine (CYMBALTA) 30 MG capsule   ALPRAZolam (XANAX) 0.5 MG tablet       Follow up plan: Return in about 3 months (around 05/29/2017), or if symptoms worsen or fail to improve, for Hypertension and diabetes.  Counseling provided for all of the vaccine components Orders Placed This Encounter  Procedures  . Bayer DCA Hb A1c Waived  . CMP14+EGFR  . Lipid panel  . TSH    Caryl Pina, MD [facility] 02/26/2017, 11:03 AM

## 2017-03-02 ENCOUNTER — Other Ambulatory Visit (INDEPENDENT_AMBULATORY_CARE_PROVIDER_SITE_OTHER): Payer: Medicare HMO

## 2017-03-02 ENCOUNTER — Other Ambulatory Visit: Payer: Self-pay | Admitting: "Endocrinology

## 2017-03-02 ENCOUNTER — Other Ambulatory Visit: Payer: Self-pay | Admitting: Family Medicine

## 2017-03-02 DIAGNOSIS — M810 Age-related osteoporosis without current pathological fracture: Secondary | ICD-10-CM

## 2017-03-03 ENCOUNTER — Other Ambulatory Visit: Payer: Medicare HMO

## 2017-03-03 DIAGNOSIS — E038 Other specified hypothyroidism: Secondary | ICD-10-CM | POA: Diagnosis not present

## 2017-03-03 DIAGNOSIS — E1165 Type 2 diabetes mellitus with hyperglycemia: Secondary | ICD-10-CM | POA: Diagnosis not present

## 2017-03-03 DIAGNOSIS — E785 Hyperlipidemia, unspecified: Secondary | ICD-10-CM | POA: Diagnosis not present

## 2017-03-03 DIAGNOSIS — I10 Essential (primary) hypertension: Secondary | ICD-10-CM | POA: Diagnosis not present

## 2017-03-03 LAB — HEMOGLOBIN A1C: Hemoglobin A1C: 7.5

## 2017-03-03 LAB — BAYER DCA HB A1C WAIVED: HB A1C (BAYER DCA - WAIVED): 7.5 % — ABNORMAL HIGH (ref <7.0)

## 2017-03-04 LAB — LIPID PANEL
CHOL/HDL RATIO: 2.2 ratio (ref 0.0–4.4)
Cholesterol, Total: 141 mg/dL (ref 100–199)
HDL: 64 mg/dL (ref >39)
LDL Calculated: 51 mg/dL (ref 0–99)
Triglycerides: 132 mg/dL (ref 0–149)
VLDL CHOLESTEROL CAL: 26 mg/dL (ref 5–40)

## 2017-03-04 LAB — CMP14+EGFR
ALK PHOS: 87 IU/L (ref 39–117)
ALT: 30 IU/L (ref 0–32)
AST: 31 IU/L (ref 0–40)
Albumin/Globulin Ratio: 1.9 (ref 1.2–2.2)
Albumin: 4.1 g/dL (ref 3.5–4.8)
BUN / CREAT RATIO: 24 (ref 12–28)
BUN: 28 mg/dL — ABNORMAL HIGH (ref 8–27)
Bilirubin Total: 0.6 mg/dL (ref 0.0–1.2)
CALCIUM: 9.7 mg/dL (ref 8.7–10.3)
CO2: 25 mmol/L (ref 20–29)
Chloride: 101 mmol/L (ref 96–106)
Creatinine, Ser: 1.15 mg/dL — ABNORMAL HIGH (ref 0.57–1.00)
GFR, EST AFRICAN AMERICAN: 53 mL/min/{1.73_m2} — AB (ref >59)
GFR, EST NON AFRICAN AMERICAN: 46 mL/min/{1.73_m2} — AB (ref >59)
GLOBULIN, TOTAL: 2.2 g/dL (ref 1.5–4.5)
GLUCOSE: 198 mg/dL — AB (ref 65–99)
POTASSIUM: 4.3 mmol/L (ref 3.5–5.2)
Sodium: 139 mmol/L (ref 134–144)
Total Protein: 6.3 g/dL (ref 6.0–8.5)

## 2017-03-04 LAB — T4, FREE: Free T4: 1.56 ng/dL (ref 0.82–1.77)

## 2017-03-04 LAB — TSH: TSH: 2.2 u[IU]/mL (ref 0.450–4.500)

## 2017-03-10 ENCOUNTER — Other Ambulatory Visit: Payer: Self-pay | Admitting: *Deleted

## 2017-03-10 MED ORDER — ROSUVASTATIN CALCIUM 5 MG PO TABS: 5.0000 mg | ORAL_TABLET | Freq: Every day | ORAL | 1 refills | Status: AC

## 2017-03-10 MED ORDER — AMLODIPINE BESYLATE 5 MG PO TABS: 5.0000 mg | ORAL_TABLET | Freq: Every day | ORAL | 1 refills | Status: DC

## 2017-03-16 ENCOUNTER — Encounter: Payer: Self-pay | Admitting: "Endocrinology

## 2017-03-16 ENCOUNTER — Ambulatory Visit: Payer: Medicare HMO | Admitting: "Endocrinology

## 2017-03-16 VITALS — BP 125/77 | HR 102 | Ht 59.0 in | Wt 121.0 lb

## 2017-03-16 DIAGNOSIS — E038 Other specified hypothyroidism: Secondary | ICD-10-CM

## 2017-03-16 DIAGNOSIS — E782 Mixed hyperlipidemia: Secondary | ICD-10-CM | POA: Diagnosis not present

## 2017-03-16 DIAGNOSIS — I10 Essential (primary) hypertension: Secondary | ICD-10-CM

## 2017-03-16 DIAGNOSIS — E1165 Type 2 diabetes mellitus with hyperglycemia: Secondary | ICD-10-CM | POA: Insufficient documentation

## 2017-03-16 NOTE — Progress Notes (Signed)
Subjective:    Patient ID: Ellen Woods, female    DOB: 01/18/1941. Patient is being seen in f/u for management of diabetes requested by  Dettinger, Elige RadonJoshua A, MD  Past Medical History:  Diagnosis Date  . Arthritis   . Back pain 1/14   Tx by orthopedics  . COPD (chronic obstructive pulmonary disease) (HCC)   . Coronary atherosclerosis of native coronary artery    50% LAD stenosis  . Encounter for long-term (current) use of insulin (HCC)   . Family history of anesthesia complication    Daughter has nausea  . PDA (patent ductus arteriosus)    Small PDA documented by CT angiography, no pulmonary hypertension  . Pericardial calcification    Ruled out for restrictive pericarditis  . Thyroid disease   . Type II or unspecified type diabetes mellitus without mention of complication, not stated as uncontrolled   . Unspecified essential hypertension    Past Surgical History:  Procedure Laterality Date  . ABDOMINAL HYSTERECTOMY     partial  . INCISIONAL HERNIA REPAIR    . TUBAL LIGATION     Social History   Socioeconomic History  . Marital status: Widowed    Spouse name: None  . Number of children: 2  . Years of education: None  . Highest education level: None  Social Needs  . Financial resource strain: None  . Food insecurity - worry: None  . Food insecurity - inability: None  . Transportation needs - medical: None  . Transportation needs - non-medical: None  Occupational History  . None  Tobacco Use  . Smoking status: Never Smoker  . Smokeless tobacco: Never Used  Substance and Sexual Activity  . Alcohol use: No  . Drug use: No  . Sexual activity: No  Other Topics Concern  . None  Social History Narrative  . None   Outpatient Encounter Medications as of 03/16/2017  Medication Sig  . acetaminophen (TYLENOL) 325 MG tablet Take 2 tablets (650 mg total) by mouth every 6 (six) hours as needed.  . Alcohol Swabs (B-D SINGLE USE SWABS REGULAR) PADS USE FOR  FINGERSTICK BLOOD SUGAR TESTING SIX TIMES DAILY  . ALPRAZolam (XANAX) 0.5 MG tablet Take 1 tablet (0.5 mg total) by mouth 2 (two) times daily as needed.  Marland Kitchen. amLODipine (NORVASC) 5 MG tablet Take 1 tablet (5 mg total) daily by mouth. For blood pressure  . aspirin 81 MG tablet Take 81 mg by mouth daily.  . Calcium Carb-Cholecalciferol (CALCIUM 500 + D3) 500-600 MG-UNIT TABS Take 1 tablet by mouth daily.  . Continuous Blood Gluc Sensor (FREESTYLE LIBRE SENSOR SYSTEM) MISC Use one sensor every 10 days.  . DULoxetine (CYMBALTA) 30 MG capsule Take 1 capsule (30 mg total) by mouth daily.  . furosemide (LASIX) 40 MG tablet 40 mg. Take one tablet by mouth as needed  . Insulin Glargine (LANTUS SOLOSTAR) 100 UNIT/ML Solostar Pen Inject 15 Units into the skin daily with breakfast.  . KLOR-CON M20 20 MEQ tablet TAKE 1 TABLET EVERY DAY  . levothyroxine (SYNTHROID, LEVOTHROID) 50 MCG tablet TAKE 1 TABLET EVERY DAY BEFORE BREAKFAST  . lisinopril (PRINIVIL,ZESTRIL) 40 MG tablet Take 1 tablet (40 mg total) by mouth daily.  . metoprolol tartrate (LOPRESSOR) 25 MG tablet TAKE 1 TABLET TWICE DAILY  . Multiple Vitamin (MULTIVITAMIN) tablet Take 1 tablet by mouth daily.  Marland Kitchen. NITROSTAT 0.4 MG SL tablet DISSOLVE  1 TABLET  UNDER THE TONGUE EVERY 5 MINUTES AS NEEDED FOR CHEST  PAIN  . NOVOLOG FLEXPEN 100 UNIT/ML FlexPen INJECT 3 TO 5 UNITS SUBCUTANEOUSLY THREE TIMES DAILY WITH MEALS  . PROLIA 60 MG/ML SOLN injection Inject 60 mg into the skin every 6 (six) months. To be administered 08/20/2016  . rosuvastatin (CRESTOR) 5 MG tablet Take 1 tablet (5 mg total) daily by mouth.  . TRUE METRIX BLOOD GLUCOSE TEST test strip TEST FOUR TIMES DAILY  . TRUEPLUS LANCETS 30G MISC TEST FOUR TIMES DAILY   No facility-administered encounter medications on file as of 03/16/2017.    ALLERGIES: Allergies  Allergen Reactions  . Codeine     REACTION: nausea  . Invokana [Canagliflozin]   . Naproxen Other (See Comments)    Abdominal pain   . Azithromycin Other (See Comments)    Hospital reaction   VACCINATION STATUS: Immunization History  Administered Date(s) Administered  . Influenza, High Dose Seasonal PF 02/21/2016, 02/04/2017  . Influenza,inj,Quad PF,6+ Mos 03/02/2014, 02/23/2015  . Influenza-Unspecified 02/21/2013  . Pneumococcal Conjugate-13 03/02/2014, 12/20/2016  . Zoster 09/02/2013    Diabetes  She presents for her follow-up diabetic visit. She has type 2 diabetes mellitus. Onset time: She was diagnosed at approximate age of 76 years. Her disease course has been improving. There are no hypoglycemic associated symptoms. Pertinent negatives for hypoglycemia include no confusion, headaches, pallor or seizures. There are no diabetic associated symptoms. Pertinent negatives for diabetes include no chest pain and no polyphagia. There are no hypoglycemic complications. Symptoms are improving. Diabetic complications include heart disease and PVD. Risk factors for coronary artery disease include diabetes mellitus and hypertension. Current diabetic treatment includes insulin injections (She is taking 16 units of Lantus in the morning and NovoLog 3-10 units with meals.). Her weight is stable. She is following a generally unhealthy diet. When asked about meal planning, she reported none. She has not had a previous visit with a dietitian. She participates in exercise intermittently. Her home blood glucose trend is fluctuating minimally (Her logs show that she has less frequent hypoglycemia than before, she is dosing her insulin more properly this time.). Her breakfast blood glucose range is generally 140-180 mg/dl. Her lunch blood glucose range is generally 140-180 mg/dl. Her dinner blood glucose range is generally 140-180 mg/dl. Her overall blood glucose range is 140-180 mg/dl. An ACE inhibitor/angiotensin II receptor blocker is being taken. Eye exam is current.  Hypertension  This is a chronic problem. The current episode started more  than 1 year ago. The problem is controlled. Pertinent negatives include no chest pain, headaches, palpitations or shortness of breath. Risk factors for coronary artery disease include diabetes mellitus. Past treatments include beta blockers. Hypertensive end-organ damage includes PVD.     Review of Systems  Constitutional: Negative for unexpected weight change.  HENT: Negative for trouble swallowing and voice change.   Eyes: Negative for visual disturbance.  Respiratory: Negative for cough, shortness of breath and wheezing.   Cardiovascular: Negative for chest pain, palpitations and leg swelling.  Gastrointestinal: Negative for diarrhea, nausea and vomiting.  Endocrine: Negative for cold intolerance, heat intolerance and polyphagia.  Genitourinary: Positive for frequency. Negative for dysuria and flank pain.  Musculoskeletal: Negative for arthralgias and myalgias.  Skin: Negative for color change, pallor, rash and wound.  Neurological: Negative for seizures and headaches.  Psychiatric/Behavioral: Negative for confusion and suicidal ideas.    Objective:    BP 125/77   Pulse (!) 102   Ht 4\' 11"  (1.499 m)   Wt 121 lb (54.9 kg)   BMI 24.44 kg/m  Wt Readings from Last 3 Encounters:  03/16/17 121 lb (54.9 kg)  02/26/17 123 lb (55.8 kg)  02/23/17 124 lb (56.2 kg)    Physical Exam  Constitutional: She is oriented to person, place, and time. She appears well-developed.  HENT:  Head: Normocephalic and atraumatic.  Eyes: EOM are normal.  Neck: Normal range of motion. Neck supple. No tracheal deviation present. No thyromegaly present.  Cardiovascular: Normal rate and regular rhythm.  Pulmonary/Chest: Effort normal and breath sounds normal.  Abdominal: Soft. Bowel sounds are normal. There is no tenderness. There is no guarding.  Musculoskeletal: Normal range of motion. She exhibits no edema.  Neurological: She is alert and oriented to person, place, and time. She has normal reflexes. No  cranial nerve deficit. Coordination normal.  Skin: Skin is warm and dry. No rash noted. No erythema. No pallor.  Psychiatric: She has a normal mood and affect. Judgment normal.    CMP     Component Value Date/Time   NA 139 03/03/2017 0842   K 4.3 03/03/2017 0842   CL 101 03/03/2017 0842   CO2 25 03/03/2017 0842   GLUCOSE 198 (H) 03/03/2017 0842   GLUCOSE 134 (H) 03/17/2014 0440   BUN 28 (H) 03/03/2017 0842   CREATININE 1.15 (H) 03/03/2017 0842   CREATININE 0.99 10/25/2012 0952   CALCIUM 9.7 03/03/2017 0842   PROT 6.3 03/03/2017 0842   ALBUMIN 4.1 03/03/2017 0842   AST 31 03/03/2017 0842   ALT 30 03/03/2017 0842   ALKPHOS 87 03/03/2017 0842   BILITOT 0.6 03/03/2017 0842   GFRNONAA 46 (L) 03/03/2017 0842   GFRNONAA 58 (L) 10/25/2012 0952   GFRAA 53 (L) 03/03/2017 0842   GFRAA 66 10/25/2012 0952    Diabetic Labs (most recent): Lab Results  Component Value Date   HGBA1C 7.5 03/03/2017   HGBA1C 8.1 (H) 09/03/2016   HGBA1C 7 04/25/2016     Lipid Panel ( most recent) Lipid Panel     Component Value Date/Time   CHOL 141 03/03/2017 0842   TRIG 132 03/03/2017 0842   TRIG 148 07/21/2014 1305   HDL 64 03/03/2017 0842   HDL 73 07/21/2014 1305   CHOLHDL 2.2 03/03/2017 0842   CHOLHDL 3.2 10/25/2012 0952   VLDL 18 10/25/2012 0952   LDLCALC 51 03/03/2017 0842      Assessment & Plan:   1. Uncontrolled type 2 diabetes mellitus with complication, with long-term current use of insulin (HCC)  - Patient has currently uncontrolled symptomatic type 2 DM since  76 years of age. She Came with much better blood glucose profile less fluctuating than before, no major hypoglycemic episodes. - Her labs show A1c improving to 7.5% from 8.1%. Recent labs reviewed.    her diabetes is complicated by coronary artery disease and peripheral arterial disease and patient remains at a high risk for more acute and chronic complications of diabetes which include CAD, CVA, CKD, retinopathy, and  neuropathy. These are all discussed in detail with the patient.  - I have counseled the patient on diet management  by adopting a carbohydrate restricted/protein rich diet.  -  Suggestion is made for her to avoid simple carbohydrates  from her diet including Cakes, Sweet Desserts / Pastries, Ice Cream, Soda (diet and regular), Sweet Tea, Candies, Chips, Cookies, Store Bought Juices, Alcohol in Excess of  1-2 drinks a day, Artificial Sweeteners, and "Sugar-free" Products. This will help patient to have stable blood glucose profile and potentially avoid unintended weight gain.   -  I encouraged the patient to switch to  unprocessed or minimally processed complex starch and increased protein intake (animal or plant source), fruits, and vegetables.  - Patient is advised to stick to a routine mealtimes to eat 3 meals  a day and avoid unnecessary snacks ( to snack only to correct hypoglycemia).   - I have approached patient with the following individualized plan to manage diabetes and patient agrees:   - The primary objective in the care of this patient is to avoid hypoglycemia.  - I will continue with Lantus  15 units  every morning at 8 AM with breakfast, continue  her prandial insulin NovoLog  2-4 units 3 times a day before meals  for pre-meal BG readings of 90-150mg /dl, plus patient specific correction dose for unexpected hyperglycemia above 150mg /dl, associated with strict monitoring of blood glucose 4 times a day-before meals and at bedtime.  - Patient is warned not to take insulin without proper monitoring per orders. -Adjustment parameters are given for hypo and hyperglycemia in writing. -Patient is encouraged to call clinic for blood glucose levels less than 70 or above 300 mg /dl. -She reports that she is getting some help from her daughter was not accompanying her today. - Her insurance did not help with the prescription for continuous glucose monitoring device.  -  She is not a good  candidate for SGLT2 I,  Incretin therapy, but will be considered for low-dose metformin if she continues to have normal renal function.  - Patient specific target  A1c;  LDL, HDL, Triglycerides, and  Waist Circumference were discussed in detail.  2) BP/HTN: Controlled. Continue current medications including ACEI/ARB. 3) Lipids/HPL:  Uncontrolled, LDL 132,  she will be considered for  statins. 4)  Weight/Diet: CDE Consult will be initiated , exercise, and detailed carbohydrates information provided.  5) hypothyroidism: - Her thyroid function tests are consistent with appropriate replacement. I advised her to continue levothyroxine 50 V by mouth every morning.  - We discussed about correct intake of levothyroxine, at fasting, with water, separated by at least 30 minutes from breakfast, and separated by more than 4 hours from calcium, iron, multivitamins, acid reflux medications (PPIs). -Patient is made aware of the fact that thyroid hormone replacement is needed for life, dose to be adjusted by periodic monitoring of thyroid function tests.  6) Chronic Care/Health Maintenance:  - She is not on ACEI/ARB nor Statin medications, encouraged to continue to follow up with Ophthalmology, Podiatrist at least yearly or according to recommendations, and advised to   stay away from smoking. I have recommended yearly flu vaccine and pneumonia vaccination at least every 5 years; moderate intensity exercise for up to 150 minutes weekly; and  sleep for at least 7 hours a day.  - I advised patient to maintain close follow up with Dettinger, Elige Radon, MD for primary care needs. - Time spent with the patient: 25 min, of which >50% was spent in reviewing her sugar logs , discussing her hypo- and hyper-glycemic episodes, reviewing her current and  previous labs and insulin doses and developing a plan to avoid hypo- and hyper-glycemia.   Follow up plan: - Return in about 4 months (around 07/14/2017) for meter, and  logs.  Marquis Lunch, MD Phone: [phone]  Fax: [phone]  -  This note was partially dictated with voice recognition software. Similar sounding words can be transcribed inadequately or may not  be corrected upon review. 03/16/2017, 1:53 PM

## 2017-03-25 ENCOUNTER — Ambulatory Visit: Payer: Medicare HMO | Admitting: Family Medicine

## 2017-03-25 ENCOUNTER — Telehealth: Payer: Self-pay | Admitting: Family Medicine

## 2017-03-25 ENCOUNTER — Encounter: Payer: Self-pay | Admitting: Family Medicine

## 2017-03-25 VITALS — BP 128/79 | HR 108 | Temp 97.5°F | Ht 59.0 in | Wt 118.0 lb

## 2017-03-25 DIAGNOSIS — F339 Major depressive disorder, recurrent, unspecified: Secondary | ICD-10-CM

## 2017-03-25 MED ORDER — DULOXETINE HCL 30 MG PO CPEP: 30.0000 mg | ORAL_CAPSULE | Freq: Every day | ORAL | 1 refills | Status: DC

## 2017-03-25 MED ORDER — DULOXETINE HCL 30 MG PO CPEP: 30.0000 mg | ORAL_CAPSULE | Freq: Every day | ORAL | 1 refills | Status: AC

## 2017-03-25 NOTE — Telephone Encounter (Signed)
Patient notified of results.

## 2017-03-25 NOTE — Assessment & Plan Note (Signed)
Depression and muscle aches are doing a lot better, will continue Cymbalta 30 for now

## 2017-03-25 NOTE — Progress Notes (Signed)
BP 128/79   Pulse (!) 108   Temp (!) 97.5 F (36.4 C) (Oral)   Ht 4\' 11"  (1.499 m)   Wt 118 lb (53.5 kg)   BMI 23.83 kg/m    Subjective:    Patient ID: Ellen HaverGlenda J Mcmains, female    DOB: 08/02/1940, 76 y.o.   MRN: [id number]  HPI: Ellen Woods is a 76 y.o. female presenting on 03/25/2017 for 1 month follow up (cymbalta)   HPI Depression and myalgias follow-up Patient is coming in for follow-up on depression and myalgias.  She has been taking the Cymbalta 30 mg for the past month and says that it is doing very well for her.  She denies any issues or side effects with it.  She says is been helping a lot with her pain and with her moods and she is overall feeling very well and is happy with her current dose.  She denies any suicidal ideations or thoughts of hurting herself. [facility] 03/25/2017 03/16/2017 02/23/2017 01/21/2017 11/04/2016  Decreased Interest 0 2 2 0 3  Down, Depressed, Hopeless 0 2 2 0 0  PHQ - 2 Score 0 4 4 0 3  Altered sleeping - 2 2 - 2  Tired, decreased energy - 2 2 - 0  Change in appetite - 0 0 - 0  Feeling bad or failure about yourself  - 0 0 - 0  Trouble concentrating - 1 1 - 0  Moving slowly or fidgety/restless - 0 0 - 0  Suicidal thoughts - 0 0 - 0  PHQ-9 Score - 9 9 - 5  Difficult doing work/chores - Somewhat difficult Somewhat difficult - -  Some recent data might be hidden     Relevant past medical, surgical, family and social history reviewed and updated as indicated. Interim medical history since our last visit reviewed. Allergies and medications reviewed and updated.  Review of Systems  Constitutional: Negative for chills and fever.  Eyes: Negative for visual disturbance.  Respiratory: Negative for chest tightness and shortness of breath.   Cardiovascular: Negative for chest pain and leg swelling.  Musculoskeletal: Negative for back pain, gait problem and myalgias.  Skin: Negative for rash.  Neurological: Negative for  light-headedness and headaches.  Psychiatric/Behavioral: Positive for dysphoric mood. Negative for agitation, behavioral problems, decreased concentration, self-injury, sleep disturbance and suicidal ideas. The patient is nervous/anxious.   All other systems reviewed and are negative.   Per HPI unless specifically indicated above        Objective:    BP 128/79   Pulse (!) 108   Temp (!) 97.5 F (36.4 C) (Oral)   Ht 4\' 11"  (1.499 m)   Wt 118 lb (53.5 kg)   BMI 23.83 kg/m   Wt Readings from Last 3 Encounters:  03/25/17 118 lb (53.5 kg)  03/16/17 121 lb (54.9 kg)  02/26/17 123 lb (55.8 kg)    Physical Exam  Constitutional: She is oriented to person, place, and time. She appears well-developed and well-nourished. No distress.  Eyes: Conjunctivae are normal.  Cardiovascular: Normal rate, regular rhythm, normal heart sounds and intact distal pulses.  No murmur heard. Pulmonary/Chest: Effort normal and breath sounds normal. No respiratory distress. She has no wheezes. She has no rales.  Musculoskeletal: Normal range of motion.  Neurological: She is alert and oriented to person, place, and time. Coordination normal.  Skin: Skin is warm and dry. No rash noted. She is not diaphoretic.  Psychiatric: She has a  normal mood and affect. Her behavior is normal. Judgment normal. Her mood appears not anxious. She does not exhibit a depressed mood. She expresses no suicidal ideation. She expresses no suicidal plans.  Nursing note and vitals reviewed.       Assessment & Plan:   Problem List Items Addressed This Visit      Other   Depression, recurrent (HCC) - Primary    Depression and muscle aches are doing a lot better, will continue Cymbalta 30 for now      Relevant Medications   DULoxetine (CYMBALTA) 30 MG capsule       Follow up plan: Return in about 3 months (around 06/25/2017), or if symptoms worsen or fail to improve, for Recheck depression and myalgias.  Counseling  provided for all of the vaccine components No orders of the defined types were placed in this encounter.   Arville CareJoshua Onica Davidovich, MD [facility] 03/25/2017, 12:01 PM

## 2017-03-31 ENCOUNTER — Encounter: Payer: Self-pay | Admitting: "Endocrinology

## 2017-03-31 DIAGNOSIS — H5201 Hypermetropia, right eye: Secondary | ICD-10-CM | POA: Diagnosis not present

## 2017-03-31 DIAGNOSIS — E11311 Type 2 diabetes mellitus with unspecified diabetic retinopathy with macular edema: Secondary | ICD-10-CM | POA: Diagnosis not present

## 2017-03-31 DIAGNOSIS — H353131 Nonexudative age-related macular degeneration, bilateral, early dry stage: Secondary | ICD-10-CM | POA: Diagnosis not present

## 2017-03-31 DIAGNOSIS — H52223 Regular astigmatism, bilateral: Secondary | ICD-10-CM | POA: Diagnosis not present

## 2017-03-31 DIAGNOSIS — Z961 Presence of intraocular lens: Secondary | ICD-10-CM | POA: Diagnosis not present

## 2017-03-31 DIAGNOSIS — H524 Presbyopia: Secondary | ICD-10-CM | POA: Diagnosis not present

## 2017-03-31 LAB — HM DIABETES EYE EXAM

## 2017-04-20 ENCOUNTER — Other Ambulatory Visit: Payer: Self-pay | Admitting: Family Medicine

## 2017-05-05 ENCOUNTER — Inpatient Hospital Stay (HOSPITAL_COMMUNITY)
Admission: EM | Admit: 2017-05-05 | Discharge: 2017-05-25 | DRG: 308 | Disposition: A | Discharge status: Skilled Nursing Facility | Payer: Medicare HMO | Attending: Family Medicine | Admitting: Family Medicine

## 2017-05-05 ENCOUNTER — Emergency Department (HOSPITAL_COMMUNITY): Payer: Medicare HMO

## 2017-05-05 ENCOUNTER — Encounter (HOSPITAL_COMMUNITY): Payer: Self-pay | Admitting: *Deleted

## 2017-05-05 ENCOUNTER — Other Ambulatory Visit: Payer: Self-pay

## 2017-05-05 DIAGNOSIS — J9601 Acute respiratory failure with hypoxia: Secondary | ICD-10-CM | POA: Diagnosis not present

## 2017-05-05 DIAGNOSIS — N183 Chronic kidney disease, stage 3 (moderate): Secondary | ICD-10-CM | POA: Diagnosis present

## 2017-05-05 DIAGNOSIS — K2289 Other specified disease of esophagus: Secondary | ICD-10-CM

## 2017-05-05 DIAGNOSIS — J90 Pleural effusion, not elsewhere classified: Secondary | ICD-10-CM

## 2017-05-05 DIAGNOSIS — F339 Major depressive disorder, recurrent, unspecified: Secondary | ICD-10-CM

## 2017-05-05 DIAGNOSIS — K228 Other specified diseases of esophagus: Secondary | ICD-10-CM

## 2017-05-05 DIAGNOSIS — I5033 Acute on chronic diastolic (congestive) heart failure: Secondary | ICD-10-CM | POA: Diagnosis present

## 2017-05-05 DIAGNOSIS — R05 Cough: Secondary | ICD-10-CM

## 2017-05-05 DIAGNOSIS — F329 Major depressive disorder, single episode, unspecified: Secondary | ICD-10-CM | POA: Diagnosis present

## 2017-05-05 DIAGNOSIS — I5031 Acute diastolic (congestive) heart failure: Secondary | ICD-10-CM | POA: Diagnosis not present

## 2017-05-05 DIAGNOSIS — I471 Supraventricular tachycardia: Secondary | ICD-10-CM | POA: Diagnosis present

## 2017-05-05 DIAGNOSIS — E039 Hypothyroidism, unspecified: Secondary | ICD-10-CM | POA: Diagnosis present

## 2017-05-05 DIAGNOSIS — J069 Acute upper respiratory infection, unspecified: Secondary | ICD-10-CM | POA: Diagnosis not present

## 2017-05-05 DIAGNOSIS — E11649 Type 2 diabetes mellitus with hypoglycemia without coma: Secondary | ICD-10-CM | POA: Diagnosis not present

## 2017-05-05 DIAGNOSIS — K449 Diaphragmatic hernia without obstruction or gangrene: Secondary | ICD-10-CM | POA: Diagnosis not present

## 2017-05-05 DIAGNOSIS — D519 Vitamin B12 deficiency anemia, unspecified: Secondary | ICD-10-CM | POA: Diagnosis present

## 2017-05-05 DIAGNOSIS — Z79899 Other long term (current) drug therapy: Secondary | ICD-10-CM

## 2017-05-05 DIAGNOSIS — Z87310 Personal history of (healed) osteoporosis fracture: Secondary | ICD-10-CM

## 2017-05-05 DIAGNOSIS — K5909 Other constipation: Secondary | ICD-10-CM | POA: Diagnosis not present

## 2017-05-05 DIAGNOSIS — E162 Hypoglycemia, unspecified: Secondary | ICD-10-CM | POA: Diagnosis not present

## 2017-05-05 DIAGNOSIS — I5032 Chronic diastolic (congestive) heart failure: Secondary | ICD-10-CM | POA: Diagnosis present

## 2017-05-05 DIAGNOSIS — J9620 Acute and chronic respiratory failure, unspecified whether with hypoxia or hypercapnia: Secondary | ICD-10-CM | POA: Diagnosis not present

## 2017-05-05 DIAGNOSIS — J449 Chronic obstructive pulmonary disease, unspecified: Secondary | ICD-10-CM | POA: Diagnosis present

## 2017-05-05 DIAGNOSIS — E876 Hypokalemia: Secondary | ICD-10-CM | POA: Diagnosis not present

## 2017-05-05 DIAGNOSIS — IMO0002 Reserved for concepts with insufficient information to code with codable children: Secondary | ICD-10-CM | POA: Diagnosis present

## 2017-05-05 DIAGNOSIS — Z7982 Long term (current) use of aspirin: Secondary | ICD-10-CM

## 2017-05-05 DIAGNOSIS — G43A1 Cyclical vomiting, intractable: Secondary | ICD-10-CM | POA: Diagnosis present

## 2017-05-05 DIAGNOSIS — R1115 Cyclical vomiting syndrome unrelated to migraine: Secondary | ICD-10-CM

## 2017-05-05 DIAGNOSIS — Z888 Allergy status to other drugs, medicaments and biological substances status: Secondary | ICD-10-CM

## 2017-05-05 DIAGNOSIS — R079 Chest pain, unspecified: Secondary | ICD-10-CM | POA: Diagnosis not present

## 2017-05-05 DIAGNOSIS — I959 Hypotension, unspecified: Secondary | ICD-10-CM | POA: Diagnosis present

## 2017-05-05 DIAGNOSIS — I491 Atrial premature depolarization: Secondary | ICD-10-CM | POA: Diagnosis present

## 2017-05-05 DIAGNOSIS — E1165 Type 2 diabetes mellitus with hyperglycemia: Secondary | ICD-10-CM | POA: Diagnosis not present

## 2017-05-05 DIAGNOSIS — I481 Persistent atrial fibrillation: Principal | ICD-10-CM | POA: Diagnosis present

## 2017-05-05 DIAGNOSIS — K222 Esophageal obstruction: Secondary | ICD-10-CM | POA: Diagnosis present

## 2017-05-05 DIAGNOSIS — R739 Hyperglycemia, unspecified: Secondary | ICD-10-CM

## 2017-05-05 DIAGNOSIS — I10 Essential (primary) hypertension: Secondary | ICD-10-CM

## 2017-05-05 DIAGNOSIS — Z881 Allergy status to other antibiotic agents status: Secondary | ICD-10-CM

## 2017-05-05 DIAGNOSIS — F419 Anxiety disorder, unspecified: Secondary | ICD-10-CM | POA: Diagnosis present

## 2017-05-05 DIAGNOSIS — E782 Mixed hyperlipidemia: Secondary | ICD-10-CM | POA: Diagnosis not present

## 2017-05-05 DIAGNOSIS — I25119 Atherosclerotic heart disease of native coronary artery with unspecified angina pectoris: Secondary | ICD-10-CM | POA: Diagnosis not present

## 2017-05-05 DIAGNOSIS — M1990 Unspecified osteoarthritis, unspecified site: Secondary | ICD-10-CM | POA: Diagnosis present

## 2017-05-05 DIAGNOSIS — E038 Other specified hypothyroidism: Secondary | ICD-10-CM | POA: Diagnosis not present

## 2017-05-05 DIAGNOSIS — J948 Other specified pleural conditions: Secondary | ICD-10-CM | POA: Diagnosis not present

## 2017-05-05 DIAGNOSIS — Z8249 Family history of ischemic heart disease and other diseases of the circulatory system: Secondary | ICD-10-CM

## 2017-05-05 DIAGNOSIS — I130 Hypertensive heart and chronic kidney disease with heart failure and stage 1 through stage 4 chronic kidney disease, or unspecified chronic kidney disease: Secondary | ICD-10-CM | POA: Diagnosis not present

## 2017-05-05 DIAGNOSIS — R0902 Hypoxemia: Secondary | ICD-10-CM

## 2017-05-05 DIAGNOSIS — R509 Fever, unspecified: Secondary | ICD-10-CM

## 2017-05-05 DIAGNOSIS — R1310 Dysphagia, unspecified: Secondary | ICD-10-CM | POA: Diagnosis not present

## 2017-05-05 DIAGNOSIS — J690 Pneumonitis due to inhalation of food and vomit: Secondary | ICD-10-CM | POA: Diagnosis present

## 2017-05-05 DIAGNOSIS — Q250 Patent ductus arteriosus: Secondary | ICD-10-CM

## 2017-05-05 DIAGNOSIS — R059 Cough, unspecified: Secondary | ICD-10-CM

## 2017-05-05 DIAGNOSIS — K219 Gastro-esophageal reflux disease without esophagitis: Secondary | ICD-10-CM | POA: Diagnosis present

## 2017-05-05 DIAGNOSIS — K3189 Other diseases of stomach and duodenum: Secondary | ICD-10-CM | POA: Diagnosis not present

## 2017-05-05 DIAGNOSIS — M810 Age-related osteoporosis without current pathological fracture: Secondary | ICD-10-CM | POA: Diagnosis present

## 2017-05-05 DIAGNOSIS — I4891 Unspecified atrial fibrillation: Secondary | ICD-10-CM | POA: Diagnosis not present

## 2017-05-05 DIAGNOSIS — Z885 Allergy status to narcotic agent status: Secondary | ICD-10-CM | POA: Diagnosis not present

## 2017-05-05 DIAGNOSIS — K2950 Unspecified chronic gastritis without bleeding: Secondary | ICD-10-CM | POA: Diagnosis not present

## 2017-05-05 DIAGNOSIS — R0600 Dyspnea, unspecified: Secondary | ICD-10-CM

## 2017-05-05 DIAGNOSIS — I509 Heart failure, unspecified: Secondary | ICD-10-CM | POA: Diagnosis not present

## 2017-05-05 DIAGNOSIS — Z7401 Bed confinement status: Secondary | ICD-10-CM | POA: Diagnosis not present

## 2017-05-05 DIAGNOSIS — R1314 Dysphagia, pharyngoesophageal phase: Secondary | ICD-10-CM | POA: Diagnosis present

## 2017-05-05 DIAGNOSIS — R112 Nausea with vomiting, unspecified: Secondary | ICD-10-CM | POA: Diagnosis not present

## 2017-05-05 DIAGNOSIS — I480 Paroxysmal atrial fibrillation: Secondary | ICD-10-CM | POA: Diagnosis present

## 2017-05-05 DIAGNOSIS — R279 Unspecified lack of coordination: Secondary | ICD-10-CM | POA: Diagnosis not present

## 2017-05-05 DIAGNOSIS — E1122 Type 2 diabetes mellitus with diabetic chronic kidney disease: Secondary | ICD-10-CM | POA: Diagnosis present

## 2017-05-05 DIAGNOSIS — Z833 Family history of diabetes mellitus: Secondary | ICD-10-CM

## 2017-05-05 DIAGNOSIS — N179 Acute kidney failure, unspecified: Secondary | ICD-10-CM | POA: Diagnosis not present

## 2017-05-05 DIAGNOSIS — R0602 Shortness of breath: Secondary | ICD-10-CM | POA: Diagnosis not present

## 2017-05-05 DIAGNOSIS — Z7901 Long term (current) use of anticoagulants: Secondary | ICD-10-CM

## 2017-05-05 DIAGNOSIS — J189 Pneumonia, unspecified organism: Secondary | ICD-10-CM | POA: Diagnosis not present

## 2017-05-05 DIAGNOSIS — K224 Dyskinesia of esophagus: Secondary | ICD-10-CM | POA: Diagnosis not present

## 2017-05-05 DIAGNOSIS — Z9889 Other specified postprocedural states: Secondary | ICD-10-CM

## 2017-05-05 DIAGNOSIS — I2510 Atherosclerotic heart disease of native coronary artery without angina pectoris: Secondary | ICD-10-CM | POA: Diagnosis not present

## 2017-05-05 DIAGNOSIS — Z823 Family history of stroke: Secondary | ICD-10-CM

## 2017-05-05 DIAGNOSIS — K5900 Constipation, unspecified: Secondary | ICD-10-CM | POA: Diagnosis not present

## 2017-05-05 DIAGNOSIS — R933 Abnormal findings on diagnostic imaging of other parts of digestive tract: Secondary | ICD-10-CM | POA: Diagnosis not present

## 2017-05-05 DIAGNOSIS — J181 Lobar pneumonia, unspecified organism: Secondary | ICD-10-CM | POA: Diagnosis not present

## 2017-05-05 DIAGNOSIS — E119 Type 2 diabetes mellitus without complications: Secondary | ICD-10-CM | POA: Diagnosis not present

## 2017-05-05 DIAGNOSIS — Z794 Long term (current) use of insulin: Secondary | ICD-10-CM

## 2017-05-05 DIAGNOSIS — I110 Hypertensive heart disease with heart failure: Secondary | ICD-10-CM | POA: Diagnosis not present

## 2017-05-05 DIAGNOSIS — J9621 Acute and chronic respiratory failure with hypoxia: Secondary | ICD-10-CM | POA: Diagnosis not present

## 2017-05-05 DIAGNOSIS — Z90710 Acquired absence of both cervix and uterus: Secondary | ICD-10-CM

## 2017-05-05 DIAGNOSIS — Z79890 Hormone replacement therapy (postmenopausal): Secondary | ICD-10-CM

## 2017-05-05 DIAGNOSIS — M6281 Muscle weakness (generalized): Secondary | ICD-10-CM | POA: Diagnosis not present

## 2017-05-05 LAB — CBC WITH DIFFERENTIAL/PLATELET
BASOS ABS: 0 10*3/uL (ref 0.0–0.1)
BASOS PCT: 0 %
Eosinophils Absolute: 0.1 10*3/uL (ref 0.0–0.7)
Eosinophils Relative: 1 %
HCT: 38.9 % (ref 36.0–46.0)
Hemoglobin: 12 g/dL (ref 12.0–15.0)
LYMPHS PCT: 9 %
Lymphs Abs: 0.9 10*3/uL (ref 0.7–4.0)
MCH: 31.2 pg (ref 26.0–34.0)
MCHC: 30.8 g/dL (ref 30.0–36.0)
MCV: 101 fL — AB (ref 78.0–100.0)
MONO ABS: 0.5 10*3/uL (ref 0.1–1.0)
Monocytes Relative: 5 %
Neutro Abs: 7.6 10*3/uL (ref 1.7–7.7)
Neutrophils Relative %: 84 %
PLATELETS: 226 10*3/uL (ref 150–400)
RBC: 3.85 MIL/uL — AB (ref 3.87–5.11)
RDW: 13.7 % (ref 11.5–15.5)
WBC: 9 10*3/uL (ref 4.0–10.5)

## 2017-05-05 LAB — BASIC METABOLIC PANEL
ANION GAP: 13 (ref 5–15)
BUN: 42 mg/dL — ABNORMAL HIGH (ref 6–20)
CALCIUM: 8.9 mg/dL (ref 8.9–10.3)
CO2: 20 mmol/L — ABNORMAL LOW (ref 22–32)
Chloride: 105 mmol/L (ref 101–111)
Creatinine, Ser: 1.16 mg/dL — ABNORMAL HIGH (ref 0.44–1.00)
GFR calc Af Amer: 52 mL/min — ABNORMAL LOW (ref >60)
GFR, EST NON AFRICAN AMERICAN: 45 mL/min — AB (ref >60)
GLUCOSE: 314 mg/dL — AB (ref 65–99)
Potassium: 4.8 mmol/L (ref 3.5–5.1)
Sodium: 138 mmol/L (ref 135–145)

## 2017-05-05 LAB — TROPONIN I: Troponin I: <0.03 ng/mL (ref <0.03)

## 2017-05-05 LAB — CBG MONITORING, ED
GLUCOSE-CAPILLARY: 312 mg/dL — AB (ref 65–99)
GLUCOSE-CAPILLARY: 290 mg/dL — AB (ref 65–99)

## 2017-05-05 LAB — PROTIME-INR
INR: 1.07
Prothrombin Time: 13.8 seconds (ref 11.4–15.2)

## 2017-05-05 LAB — GLUCOSE, CAPILLARY: Glucose-Capillary: 206 mg/dL — ABNORMAL HIGH (ref 65–99)

## 2017-05-05 MED ORDER — FUROSEMIDE 10 MG/ML IJ SOLN
40.0000 mg | Freq: Once | INTRAMUSCULAR | Status: AC
  Administered 2017-05-05: 40 mg via INTRAVENOUS
  Filled 2017-05-05: qty 4

## 2017-05-05 MED ORDER — INSULIN ASPART 100 UNIT/ML ~~LOC~~ SOLN
0.0000 [IU] | Freq: Three times a day (TID) | SUBCUTANEOUS | Status: DC
  Administered 2017-05-05: 7 [IU] via SUBCUTANEOUS
  Administered 2017-05-06 – 2017-05-07 (×4): 2 [IU] via SUBCUTANEOUS
  Administered 2017-05-09: 5 [IU] via SUBCUTANEOUS
  Filled 2017-05-05: qty 1

## 2017-05-05 MED ORDER — LEVOTHYROXINE SODIUM 50 MCG PO TABS
50.0000 ug | ORAL_TABLET | Freq: Every day | ORAL | Status: DC
  Administered 2017-05-06 – 2017-05-25 (×19): 50 ug via ORAL
  Filled 2017-05-05: qty 2
  Filled 2017-05-05 (×2): qty 1
  Filled 2017-05-05 (×3): qty 2
  Filled 2017-05-05 (×5): qty 1
  Filled 2017-05-05: qty 2
  Filled 2017-05-05: qty 1
  Filled 2017-05-05: qty 2
  Filled 2017-05-05 (×2): qty 1
  Filled 2017-05-05: qty 2
  Filled 2017-05-05 (×2): qty 1

## 2017-05-05 MED ORDER — DULOXETINE HCL 30 MG PO CPEP
30.0000 mg | ORAL_CAPSULE | Freq: Every day | ORAL | Status: DC
  Administered 2017-05-05 – 2017-05-25 (×20): 30 mg via ORAL
  Filled 2017-05-05 (×20): qty 1

## 2017-05-05 MED ORDER — ROSUVASTATIN CALCIUM 10 MG PO TABS
5.0000 mg | ORAL_TABLET | Freq: Every day | ORAL | Status: DC
  Administered 2017-05-06 – 2017-05-25 (×19): 5 mg via ORAL
  Filled 2017-05-05 (×19): qty 1

## 2017-05-05 MED ORDER — ENOXAPARIN SODIUM 60 MG/0.6ML ~~LOC~~ SOLN
60.0000 mg | Freq: Two times a day (BID) | SUBCUTANEOUS | Status: DC
  Administered 2017-05-05 – 2017-05-06 (×3): 60 mg via SUBCUTANEOUS
  Filled 2017-05-05 (×3): qty 0.6

## 2017-05-05 MED ORDER — ALPRAZOLAM 0.5 MG PO TABS
0.5000 mg | ORAL_TABLET | Freq: Two times a day (BID) | ORAL | Status: DC | PRN
  Administered 2017-05-05 – 2017-05-24 (×16): 0.5 mg via ORAL
  Filled 2017-05-05 (×16): qty 1

## 2017-05-05 MED ORDER — WARFARIN - PHARMACIST DOSING INPATIENT
Freq: Every day | Status: DC
  Administered 2017-05-06 – 2017-05-07 (×2)

## 2017-05-05 MED ORDER — DILTIAZEM HCL-DEXTROSE 100-5 MG/100ML-% IV SOLN (PREMIX)
5.0000 mg/h | INTRAVENOUS | Status: DC
  Administered 2017-05-05: 10 mg/h via INTRAVENOUS
  Administered 2017-05-05: 5 mg/h via INTRAVENOUS
  Filled 2017-05-05 (×2): qty 100

## 2017-05-05 MED ORDER — DILTIAZEM HCL-DEXTROSE 100-5 MG/100ML-% IV SOLN (PREMIX)
5.0000 mg/h | INTRAVENOUS | Status: DC
  Administered 2017-05-05 – 2017-05-06 (×2): 5 mg/h via INTRAVENOUS
  Administered 2017-05-06: 15 mg/h via INTRAVENOUS
  Administered 2017-05-07: 12.5 mg/h via INTRAVENOUS
  Administered 2017-05-07: 10 mg/h via INTRAVENOUS
  Filled 2017-05-05 (×4): qty 100

## 2017-05-05 MED ORDER — WARFARIN SODIUM 5 MG PO TABS
5.0000 mg | ORAL_TABLET | Freq: Once | ORAL | Status: AC
  Administered 2017-05-05: 5 mg via ORAL
  Filled 2017-05-05: qty 1

## 2017-05-05 MED ORDER — INSULIN GLARGINE 100 UNIT/ML ~~LOC~~ SOLN
15.0000 [IU] | Freq: Every day | SUBCUTANEOUS | Status: DC
  Administered 2017-05-06 – 2017-05-08 (×3): 15 [IU] via SUBCUTANEOUS
  Filled 2017-05-05 (×3): qty 0.15

## 2017-05-05 NOTE — ED Provider Notes (Addendum)
[facility] EMERGENCY DEPARTMENT Provider Note   CSN: [id number] Arrival date & time: 05/05/17  1210     History   Chief Complaint Chief Complaint  Patient presents with  . Tachycardia    HPI Ellen Woods is a 77 y.o. female.  Level 5 caveat for acuity of condition.  Patient noted palpitations and a rapid heart rate in the middle the night.  No substernal chest pain.  Minimal dyspnea.  Patient states facial swelling today.  She took a Xanax this morning which helped minimally.  No known history of atrial fibrillation.      Past Medical History:  Diagnosis Date  . Arthritis   . Back pain 1/14   Tx by orthopedics  . COPD (chronic obstructive pulmonary disease) (HCC)   . Coronary atherosclerosis of native coronary artery    50% LAD stenosis  . Encounter for long-term (current) use of insulin (HCC)   . Family history of anesthesia complication    Daughter has nausea  . PDA (patent ductus arteriosus)    Small PDA documented by CT angiography, no pulmonary hypertension  . Pericardial calcification    Ruled out for restrictive pericarditis  . Thyroid disease   . Type II or unspecified type diabetes mellitus without mention of complication, not stated as uncontrolled   . Unspecified essential hypertension     Patient Active Problem List   Diagnosis Date Noted  . Uncontrolled type 2 diabetes mellitus with hyperglycemia (HCC) 03/16/2017  . Depression, recurrent (HCC) 02/26/2017  . Pathological fracture of left femur due to age-related osteoporosis with routine healing 06/06/2015  . Hypokalemia 07/21/2014  . Other dietary vitamin B12 deficiency anemia 07/21/2014  . Other seasonal allergic rhinitis 07/21/2014  . Hypothyroidism 07/21/2014  . Transaminitis   . Diabetes type 2, uncontrolled (HCC)   . Metabolic encephalopathy   . Constipation 03/15/2014  . SIRS (systemic inflammatory response syndrome) (HCC) 03/14/2014  . Chronic diastolic heart failure (HCC) 03/14/2014    . Osteoporosis 10/25/2012  . PAF (paroxysmal atrial fibrillation) (HCC) 03/15/2012  . Carotid bruit 03/15/2012  . Pericardial calcification   . PDA (patent ductus arteriosus)   . Coronary atherosclerosis of native coronary artery   . Mixed hyperlipidemia 03/18/2010  . Essential hypertension, benign 03/18/2010    Past Surgical History:  Procedure Laterality Date  . ABDOMINAL HYSTERECTOMY     partial  . INCISIONAL HERNIA REPAIR    . INTRAMEDULLARY (IM) NAIL INTERTROCHANTERIC Left 11/13/2012   Procedure: INTRAMEDULLARY (IM) NAIL INTERTROCHANTRIC LEFT HIP;  Surgeon: Velna Ochs, MD;  Location: [facility] OR;  Service: Orthopedics;  Laterality: Left;  . TUBAL LIGATION      OB History    No data available       Home Medications    Prior to Admission medications   Medication Sig Start Date End Date Taking? Authorizing Provider  acetaminophen (TYLENOL) 325 MG tablet Take 2 tablets (650 mg total) by mouth every 6 (six) hours as needed. 11/18/12  Yes Rai, Ripudeep K, MD  ALPRAZolam (XANAX) 0.5 MG tablet Take 1 tablet (0.5 mg total) by mouth 2 (two) times daily as needed. 02/26/17  Yes Dettinger, Elige Radon, MD  amLODipine (NORVASC) 5 MG tablet Take 1 tablet (5 mg total) daily by mouth. For blood pressure 03/10/17  Yes Dettinger, Elige Radon, MD  aspirin 81 MG tablet Take 81 mg by mouth daily.   Yes [provider]  Calcium Carb-Cholecalciferol (CALCIUM 500 + D3) 500-600 MG-UNIT TABS Take 1 tablet by  mouth daily. 06/06/15  Yes Henrene PastorEckard, [name], PharmD  DULoxetine (CYMBALTA) 30 MG capsule Take 1 capsule (30 mg total) by mouth daily. 03/25/17  Yes Dettinger, Elige RadonJoshua A, MD  furosemide (LASIX) 40 MG tablet 20 mg. Take one tablet by mouth as needed 01/01/17  Yes [provider]  Insulin Glargine (LANTUS SOLOSTAR) 100 UNIT/ML Solostar Pen Inject 15 Units into the skin daily with breakfast. Patient taking differently: Inject 14 Units into the skin daily with breakfast.  02/04/17  Yes Nida,  Denman GeorgeGebreselassie W, MD  KLOR-CON M20 20 MEQ tablet TAKE 1 TABLET EVERY DAY 02/03/17  Yes Dettinger, Elige RadonJoshua A, MD  levothyroxine (SYNTHROID, LEVOTHROID) 50 MCG tablet TAKE 1 TABLET EVERY DAY BEFORE BREAKFAST 02/17/17  Yes Nida, Denman GeorgeGebreselassie W, MD  lisinopril (PRINIVIL,ZESTRIL) 40 MG tablet Take 1 tablet (40 mg total) by mouth daily. 12/08/16  Yes Laqueta LindenKoneswaran, Suresh A, MD  metoprolol tartrate (LOPRESSOR) 25 MG tablet TAKE 1 TABLET TWICE DAILY 02/03/17  Yes Dettinger, Elige RadonJoshua A, MD  NITROSTAT 0.4 MG SL tablet DISSOLVE  1 TABLET  UNDER THE TONGUE EVERY 5 MINUTES AS NEEDED FOR CHEST PAIN 01/16/17  Yes Stacks, Broadus JohnWarren, MD  NOVOLOG FLEXPEN 100 UNIT/ML FlexPen INJECT 3 TO 5 UNITS SUBCUTANEOUSLY THREE TIMES DAILY WITH MEALS 12/16/16  Yes Nida, Denman GeorgeGebreselassie W, MD  PROLIA 60 MG/ML SOLN injection Inject 60 mg into the skin every 6 (six) months. To be administered 08/20/2016 07/25/16  Yes Stacks, Broadus JohnWarren, MD  rosuvastatin (CRESTOR) 5 MG tablet Take 1 tablet (5 mg total) daily by mouth. 03/10/17 06/08/17 Yes Dettinger, Elige RadonJoshua A, MD    Family History Family History  Problem Relation Age of Onset  . Stroke Mother   . Hypertension Mother   . Diabetes Brother   . Early death Brother   . Heart disease Daughter   . Stroke Daughter   . Congenital heart disease Daughter   . Diabetes Son 359  . Stroke Brother   . Diabetes Brother 6       type 1    Social History Social History   Tobacco Use  . Smoking status: Never Smoker  . Smokeless tobacco: Never Used  Substance Use Topics  . Alcohol use: No  . Drug use: No     Allergies   Codeine; Invokana [canagliflozin]; Naproxen; and Azithromycin   Review of Systems Review of Systems  Unable to perform ROS: Acuity of condition     Physical Exam Updated Vital Signs BP 119/86   Pulse (!) 130   Temp 98.3 F (36.8 C) (Oral)   Resp (!) 24   Ht 4\' 11"  (1.499 m)   Wt 54.4 kg (120 lb)   SpO2 94%   BMI 24.24 kg/m   Physical Exam  Constitutional: She is oriented  to person, place, and time.  nad  HENT:  Slight facial puffiness  Eyes: Conjunctivae are normal.  Neck: Neck supple.  Cardiovascular:  Tachycardic; irregularly irregular  Pulmonary/Chest: Effort normal and breath sounds normal.  Abdominal: Soft. Bowel sounds are normal.  Musculoskeletal: Normal range of motion.  Neurological: She is alert and oriented to person, place, and time.  Skin: Skin is warm and dry.  Psychiatric: She has a normal mood and affect. Her behavior is normal.  Nursing note and vitals reviewed.    ED Treatments / Results  Labs (all labs ordered are listed, but only abnormal results are displayed) Labs Reviewed  CBC WITH DIFFERENTIAL/PLATELET - Abnormal; Notable for the following components:      Result Value  RBC 3.85 (*)    MCV 101.0 (*)    All other components within normal limits  BASIC METABOLIC PANEL - Abnormal; Notable for the following components:   CO2 20 (*)    Glucose, Bld 314 (*)    BUN 42 (*)    Creatinine, Ser 1.16 (*)    GFR calc non Af Amer 45 (*)    GFR calc Af Amer 52 (*)    All other components within normal limits  TROPONIN I    EKG  EKG Interpretation  Date/Time:  Tuesday May 05 2017 12:49:39 EST Ventricular Rate:  134 PR Interval:    QRS Duration: 80 QT Interval:  360 QTC Calculation: 537 R Axis:   -32 Text Interpretation:  Atrial fibrillation with rapid ventricular response Left axis deviation Anteroseptal infarct , age undetermined Abnormal ECG Confirmed by Donnetta Hutching ([id number]) on 05/05/2017 12:55:45 PM Also confirmed by Donnetta Hutching ([id number])  on 05/05/2017 1:23:35 PM       Radiology Dg Chest Port 1 View  Result Date: 05/05/2017 CLINICAL DATA:  Chest pain EXAM: PORTABLE CHEST 1 VIEW COMPARISON:  10/12/2016 FINDINGS: Cardiac shadow remains enlarged. Aortic calcifications are noted. Vascular congestion with interstitial edema and bilateral pleural effusions right greater than left is noted. No bony abnormality is seen.  IMPRESSION: Changes of CHF. Electronically Signed   By: Alcide Clever M.D.   On: 05/05/2017 13:54    Procedures Procedures (including critical care time)  Medications Ordered in ED Medications  diltiazem (CARDIZEM) 100 mg in dextrose 5% (1 mg/mL) infusion (10 mg/hr Intravenous Rate/Dose Change 05/05/17 1436)  furosemide (LASIX) injection 40 mg (not administered)     Initial Impression / Assessment and Plan / ED Course  I have reviewed the triage vital signs and the nursing notes.  Pertinent labs & imaging results that were available during my care of the patient were reviewed by me and considered in my medical decision making (see chart for details).    Patient presents with palpitations and minimal dyspnea.  EKG reveals atrial fibrillation with RVR rate 134.  Chest x-ray shows minimal pulmonary edema.  Will initiate diltiazem IV, Lasix 40 mg IV.  She is hemodynamically stable.  Will admit to general medicine.   CRITICAL CARE Performed by: Donnetta Hutching Total critical care time: 30 minutes Critical care time was exclusive of separately billable procedures and treating other patients. Critical care was necessary to treat or prevent imminent or life-threatening deterioration. Critical care was time spent personally by me on the following activities: development of treatment plan with patient and/or surrogate as well as nursing, discussions with consultants, evaluation of patient's response to treatment, examination of patient, obtaining history from patient or surrogate, ordering and performing treatments and interventions, ordering and review of laboratory studies, ordering and review of radiographic studies, pulse oximetry and re-evaluation of patient's condition.   Final Clinical Impressions(s) / ED Diagnoses   Final diagnoses:  Atrial fibrillation with RVR (HCC)  Congestive heart failure, unspecified HF chronicity, unspecified heart failure type [facility])  Hyperglycemia    ED  Discharge Orders    None       Donnetta Hutching, MD 05/05/17 1443    Donnetta Hutching, MD 05/05/17 1517

## 2017-05-05 NOTE — ED Notes (Signed)
Cardizem titrated up to 10mg /hr. Pt tolerating well.family at bedside.

## 2017-05-05 NOTE — Progress Notes (Signed)
ANTICOAGULATION CONSULT NOTE - Initial Consult  Pharmacy Consult for Lovenox and Coumadin Indication: atrial fibrillation  Allergies  Allergen Reactions  . Codeine     REACTION: nausea  . Invokana [Canagliflozin]   . Naproxen Other (See Comments)    Abdominal pain  . Azithromycin Other (See Comments)    Hospital reaction    Patient Measurements: Height: 4\' 11"  (149.9 cm) Weight: 123 lb 0.3 oz (55.8 kg) IBW/kg (Calculated) : 43.2  Vital Signs: Temp: 98.5 F (36.9 C) (01/01 2126) Temp Source: Oral (01/01 2126) BP: 123/64 (01/01 2145) Pulse Rate: 85 (01/01 2145)  Labs: Recent Labs    05/05/17 1322  HGB 12.0  HCT 38.9  PLT 226  CREATININE 1.16*  TROPONINI <0.03    Estimated Creatinine Clearance: 31.4 mL/min (A) (by C-G formula based on SCr of 1.16 mg/dL (H)).   Medical History: Past Medical History:  Diagnosis Date  . Arthritis   . Back pain 1/14   Tx by orthopedics  . COPD (chronic obstructive pulmonary disease) (HCC)   . Coronary atherosclerosis of native coronary artery    50% LAD stenosis  . Encounter for long-term (current) use of insulin (HCC)   . Family history of anesthesia complication    Daughter has nausea  . PDA (patent ductus arteriosus)    Small PDA documented by CT angiography, no pulmonary hypertension  . Pericardial calcification    Ruled out for restrictive pericarditis  . Thyroid disease   . Type II or unspecified type diabetes mellitus without mention of complication, not stated as uncontrolled   . Unspecified essential hypertension     Medications:  Medications Prior to Admission  Medication Sig Dispense Refill Last Dose  . acetaminophen (TYLENOL) 325 MG tablet Take 2 tablets (650 mg total) by mouth every 6 (six) hours as needed.   unknown  . ALPRAZolam (XANAX) 0.5 MG tablet Take 1 tablet (0.5 mg total) by mouth 2 (two) times daily as needed. 160 tablet 0 05/05/2017 at Unknown time  . amLODipine (NORVASC) 5 MG tablet Take 1 tablet (5  mg total) daily by mouth. For blood pressure 90 tablet 1 05/04/2017 at Unknown time  . aspirin 81 MG tablet Take 81 mg by mouth daily.   unknown  . Calcium Carb-Cholecalciferol (CALCIUM 500 + D3) 500-600 MG-UNIT TABS Take 1 tablet by mouth daily. 60 tablet  05/04/2017 at Unknown time  . DULoxetine (CYMBALTA) 30 MG capsule Take 1 capsule (30 mg total) by mouth daily. 90 capsule 1 05/04/2017 at Unknown time  . furosemide (LASIX) 40 MG tablet 20 mg. Take one tablet by mouth as needed   05/05/2017 at Unknown time  . Insulin Glargine (LANTUS SOLOSTAR) 100 UNIT/ML Solostar Pen Inject 15 Units into the skin daily with breakfast. (Patient taking differently: Inject 14 Units into the skin daily with breakfast. ) 15 mL 2 05/05/2017 at Unknown time  . KLOR-CON M20 20 MEQ tablet TAKE 1 TABLET EVERY DAY 90 tablet 1 05/04/2017 at Unknown time  . levothyroxine (SYNTHROID, LEVOTHROID) 50 MCG tablet TAKE 1 TABLET EVERY DAY BEFORE BREAKFAST 90 tablet 1 05/05/2017 at Unknown time  . lisinopril (PRINIVIL,ZESTRIL) 40 MG tablet Take 1 tablet (40 mg total) by mouth daily. 30 tablet 6 05/05/2017 at Unknown time  . metoprolol tartrate (LOPRESSOR) 25 MG tablet TAKE 1 TABLET TWICE DAILY 180 tablet 1 05/05/2017 at 0900  . NITROSTAT 0.4 MG SL tablet DISSOLVE  1 TABLET  UNDER THE TONGUE EVERY 5 MINUTES AS NEEDED FOR CHEST PAIN 25  tablet 0 unknown  . NOVOLOG FLEXPEN 100 UNIT/ML FlexPen INJECT 3 TO 5 UNITS SUBCUTANEOUSLY THREE TIMES DAILY WITH MEALS 15 mL 3 05/05/2017 at Unknown time  . PROLIA 60 MG/ML SOLN injection Inject 60 mg into the skin every 6 (six) months. To be administered 08/20/2016 1 each 0 unkmown  . rosuvastatin (CRESTOR) 5 MG tablet Take 1 tablet (5 mg total) daily by mouth. 90 tablet 1 05/05/2017 at Unknown time    Assessment: 77 yo female presented to ED with new onset afib. Okay for protocol. Lovenox to bridge with coumadin.  Goal of Therapy:  INR 2-3 Monitor platelets by anticoagulation protocol: Yes   Plan:  Coumadin  5mg  po x 1 Lovenox 1mg /kg (60mg ) sq q12h PT-INR daily Monitor for S/S of bleeding Coumadin education  Elder CyphersLorie Anslee Micheletti, BS Loura BackPharm D, BCPS Clinical Pharmacist Pager [phone]  05/05/2017,10:10 PM

## 2017-05-05 NOTE — H&P (Addendum)
History and Physical    Ellen Woods WJX:[id number] DOB: [birth year] DOA: 05/05/2017  PCP: Dettinger, Elige Radon, MD  Patient coming from:  home  Chief Complaint: palpitations  HPI: Ellen Woods is a 77 y.o. female with medical history significant of COPD, coronary artery disease, diabetes mellitus type 2, dyspnea on exertion who presented to the emergency department with palpitations and facial swelling.  Patient's daughter and son are bedside and states that this morning patient began to have swelling on one side of her face and it appeared as though her heart was "beating out of her chest".  They used an at home blood pressure monitor and stated her blood pressure was reasonably controlled but her heart rate was in the 150s.  They gave patient a Xanax and stated that her heart rate came down and the swelling improved.  She presented to the emergency department for further evaluation of her tachycardia.  Patient reports that she has chronic dyspnea on exertion and has told this to her cardiologist who she saw in October 2018 but states that no further workup was ordered for her.  She does mention that she has a history of coronary artery disease with upon reviewing the note from the cardiologist in October she had a negative Lexiscan Myoview.  At that time her ejection fraction was noted to be 78%.  Patient reports no recent sick contacts except for her son and daughter who had gastroenteritis.  She voices that she occasionally has vomiting but this she attributes to her esophagus requiring stretching which she states she had done at least 15 years ago in [place].  She denies fevers or chills, denies diarrhea or constipation, and denies chest pain.  She states that she thinks her palpitations began last night before she went to bed but she did not measure her heart rate at that time.  According to her problem list patient has a history of paroxysmal atrial fibrillation from 2013 however she cannot  recall this incident.  There is a cardiology note from June 2017 that voices that she had a Holter monitor that demonstrated premature atrial contractions and atrial tachycardia.  She does have a history of a PDA which is small and has been managed medically.  ED Course: Patient was found to be in atrial fibrillation with rapid ventricular response.  Diltiazem IV was started.  Creatinine was 1.16 which is slightly elevated above her baseline and her blood glucose was found to be 314.  All other laboratory data was within normal limit limits including a negative troponin and complete blood count.  Chest x-ray showed aortic calcifications vascular congestion with interstitial edema and bilateral pleural effusions right greater than left.She was also given a dose of IV Lasix in the emergency department.  Review of Systems: As per HPI otherwise 10 point review of systems negative.    Past Medical History:  Diagnosis Date  . Arthritis   . Back pain 1/14   Tx by orthopedics  . COPD (chronic obstructive pulmonary disease) (HCC)   . Coronary atherosclerosis of native coronary artery    50% LAD stenosis  . Encounter for long-term (current) use of insulin (HCC)   . Family history of anesthesia complication    Daughter has nausea  . PDA (patent ductus arteriosus)    Small PDA documented by CT angiography, no pulmonary hypertension  . Pericardial calcification    Ruled out for restrictive pericarditis  . Thyroid disease   . Type II or unspecified type  diabetes mellitus without mention of complication, not stated as uncontrolled   . Unspecified essential hypertension     Past Surgical History:  Procedure Laterality Date  . ABDOMINAL HYSTERECTOMY     partial  . INCISIONAL HERNIA REPAIR    . INTRAMEDULLARY (IM) NAIL INTERTROCHANTERIC Left 11/13/2012   Procedure: INTRAMEDULLARY (IM) NAIL INTERTROCHANTRIC LEFT HIP;  Surgeon: Velna Ochs, MD;  Location: [facility] OR;  Service: Orthopedics;  Laterality:  Left;  . TUBAL LIGATION       reports that  has never smoked. she has never used smokeless tobacco. She reports that she does not drink alcohol or use drugs.  Allergies  Allergen Reactions  . Codeine     REACTION: nausea  . Invokana [Canagliflozin]   . Naproxen Other (See Comments)    Abdominal pain  . Azithromycin Other (See Comments)    Hospital reaction    Family History  Problem Relation Age of Onset  . Stroke Mother   . Hypertension Mother   . Diabetes Brother   . Early death Brother   . Heart disease Daughter   . Stroke Daughter   . Congenital heart disease Daughter   . Diabetes Son 71  . Stroke Brother   . Diabetes Brother 6       type 1    Prior to Admission medications   Medication Sig Start Date End Date Taking? Authorizing Provider  acetaminophen (TYLENOL) 325 MG tablet Take 2 tablets (650 mg total) by mouth every 6 (six) hours as needed. 11/18/12  Yes Rai, Ripudeep K, MD  ALPRAZolam (XANAX) 0.5 MG tablet Take 1 tablet (0.5 mg total) by mouth 2 (two) times daily as needed. 02/26/17  Yes Dettinger, Elige Radon, MD  amLODipine (NORVASC) 5 MG tablet Take 1 tablet (5 mg total) daily by mouth. For blood pressure 03/10/17  Yes Dettinger, Elige Radon, MD  aspirin 81 MG tablet Take 81 mg by mouth daily.   Yes [provider]  Calcium Carb-Cholecalciferol (CALCIUM 500 + D3) 500-600 MG-UNIT TABS Take 1 tablet by mouth daily. 06/06/15  Yes Henrene Pastor, PharmD  DULoxetine (CYMBALTA) 30 MG capsule Take 1 capsule (30 mg total) by mouth daily. 03/25/17  Yes Dettinger, Elige Radon, MD  furosemide (LASIX) 40 MG tablet 20 mg. Take one tablet by mouth as needed 01/01/17  Yes [provider]  Insulin Glargine (LANTUS SOLOSTAR) 100 UNIT/ML Solostar Pen Inject 15 Units into the skin daily with breakfast. Patient taking differently: Inject 14 Units into the skin daily with breakfast.  02/04/17  Yes Nida, Denman George, MD  KLOR-CON M20 20 MEQ tablet TAKE 1 TABLET EVERY DAY  02/03/17  Yes Dettinger, Elige Radon, MD  levothyroxine (SYNTHROID, LEVOTHROID) 50 MCG tablet TAKE 1 TABLET EVERY DAY BEFORE BREAKFAST 02/17/17  Yes Nida, Denman George, MD  lisinopril (PRINIVIL,ZESTRIL) 40 MG tablet Take 1 tablet (40 mg total) by mouth daily. 12/08/16  Yes Laqueta Linden, MD  metoprolol tartrate (LOPRESSOR) 25 MG tablet TAKE 1 TABLET TWICE DAILY 02/03/17  Yes Dettinger, Elige Radon, MD  NITROSTAT 0.4 MG SL tablet DISSOLVE  1 TABLET  UNDER THE TONGUE EVERY 5 MINUTES AS NEEDED FOR CHEST PAIN 01/16/17  Yes Stacks, Broadus John, MD  NOVOLOG FLEXPEN 100 UNIT/ML FlexPen INJECT 3 TO 5 UNITS SUBCUTANEOUSLY THREE TIMES DAILY WITH MEALS 12/16/16  Yes Nida, Denman George, MD  PROLIA 60 MG/ML SOLN injection Inject 60 mg into the skin every 6 (six) months. To be administered 08/20/2016 07/25/16  Yes Mechele Claude, MD  rosuvastatin (CRESTOR) 5 MG tablet Take 1 tablet (5 mg total) daily by mouth. 03/10/17 06/08/17 Yes Dettinger, Elige RadonJoshua A, MD    Physical Exam: Vitals:   05/05/17 1435 05/05/17 1440 05/05/17 1505 05/05/17 1520  BP: 122/90 112/80 122/81 97/70  Pulse: (!) 138 (!) 140 (!) 28 (!) 112  Resp: (!) 26 (!) 27 (!) 26 (!) 21  Temp:      TempSrc:      SpO2: 93% 93% 94% 94%  Weight:      Height:          Constitutional: NAD, calm, comfortable Vitals:   05/05/17 1435 05/05/17 1440 05/05/17 1505 05/05/17 1520  BP: 122/90 112/80 122/81 97/70  Pulse: (!) 138 (!) 140 (!) 28 (!) 112  Resp: (!) 26 (!) 27 (!) 26 (!) 21  Temp:      TempSrc:      SpO2: 93% 93% 94% 94%  Weight:      Height:       Eyes: PERRL, lids and conjunctivae normal ENMT: Mucous membranes are moist. Posterior pharynx clear of any exudate or lesions.Normal dentition.  Neck: normal, supple, no masses, no thyromegaly Respiratory: clear to auscultation bilaterally, no wheezing, no crackles. Normal respiratory effort. No accessory muscle use.  Cardiovascular: Irregularly irregular rhythm, no murmurs / rubs / gallops. No  extremity edema. 2+ pedal pulses. No carotid bruits.  Abdomen: no tenderness, no masses palpated. No hepatosplenomegaly. Bowel sounds positive.  Musculoskeletal: no clubbing / cyanosis. No joint deformity upper and lower extremities. Good ROM, no contractures. Normal muscle tone.  Skin: no rashes, lesions, ulcers. No induration Neurologic: CN 2-12 grossly intact. Sensation intact, DTR normal. Strength 5/5 in all 4.  Psychiatric: Normal judgment and insight. Alert and oriented x 3. Normal mood.     Labs on Admission: I have personally reviewed following labs and imaging studies  CBC: Recent Labs  Lab 05/05/17 1322  WBC 9.0  NEUTROABS 7.6  HGB 12.0  HCT 38.9  MCV 101.0*  PLT 226   Basic Metabolic Panel: Recent Labs  Lab 05/05/17 1322  NA 138  K 4.8  CL 105  CO2 20*  GLUCOSE 314*  BUN 42*  CREATININE 1.16*  CALCIUM 8.9   GFR: Estimated Creatinine Clearance: 31.1 mL/min (A) (by C-G formula based on SCr of 1.16 mg/dL (H)). Liver Function Tests: No results for input(s): AST, ALT, ALKPHOS, BILITOT, PROT, ALBUMIN in the last 168 hours. No results for input(s): LIPASE, AMYLASE in the last 168 hours. No results for input(s): AMMONIA in the last 168 hours. Coagulation Profile: No results for input(s): INR, PROTIME in the last 168 hours. Cardiac Enzymes: Recent Labs  Lab 05/05/17 1322  TROPONINI <0.03   BNP (last 3 results) No results for input(s): PROBNP in the last 8760 hours. HbA1C: No results for input(s): HGBA1C in the last 72 hours. CBG: No results for input(s): GLUCAP in the last 168 hours. Lipid Profile: No results for input(s): CHOL, HDL, LDLCALC, TRIG, CHOLHDL, LDLDIRECT in the last 72 hours. Thyroid Function Tests: No results for input(s): TSH, T4TOTAL, FREET4, T3FREE, THYROIDAB in the last 72 hours. Anemia Panel: No results for input(s): VITAMINB12, FOLATE, FERRITIN, TIBC, IRON, RETICCTPCT in the last 72 hours. Urine analysis:    Component Value  Date/Time   COLORURINE YELLOW 03/14/2014 1227   APPEARANCEUR CLEAR 03/14/2014 1227   LABSPEC 1.015 03/14/2014 1227   PHURINE 5.0 03/14/2014 1227   GLUCOSEU 100 (A) 03/14/2014 1227   HGBUR NEGATIVE 03/14/2014 1227   BILIRUBINUR  neg 06/13/2015 1710   KETONESUR NEGATIVE 03/14/2014 1227   PROTEINUR neg 06/13/2015 1710   PROTEINUR NEGATIVE 03/14/2014 1227   UROBILINOGEN negative 06/13/2015 1710   UROBILINOGEN 0.2 03/14/2014 1227   NITRITE neg 06/13/2015 1710   NITRITE NEGATIVE 03/14/2014 1227   LEUKOCYTESUR Negative 06/13/2015 1710   Sepsis Labs: !!!!!!!!!!!!!!!!!!!!!!!!!!!!!!!!!!!!!!!!!!!! @LABRCNTIP (procalcitonin:4,lacticidven:4) )No results found for this or any previous visit (from the past 240 hour(s)).   Radiological Exams on Admission: Dg Chest Port 1 View  Result Date: 05/05/2017 CLINICAL DATA:  Chest pain EXAM: PORTABLE CHEST 1 VIEW COMPARISON:  10/12/2016 FINDINGS: Cardiac shadow remains enlarged. Aortic calcifications are noted. Vascular congestion with interstitial edema and bilateral pleural effusions right greater than left is noted. No bony abnormality is seen. IMPRESSION: Changes of CHF. Electronically Signed   By: Alcide Clever M.D.   On: 05/05/2017 13:54    EKG: Independently reviewed.  Atrial fibrillation with rapid ventricular response  Assessment/Plan Principal Problem:   Atrial fibrillation with RVR (HCC) Active Problems:   Mixed hyperlipidemia   Essential hypertension, benign   Chronic diastolic heart failure (HCC)   Diabetes type 2, uncontrolled (HCC)   Hypothyroidism   Uncontrolled type 2 diabetes mellitus with hyperglycemia (HCC)   Atrial fibrillation -Patient currently on diltiazem drip -We will need to transition to p.o. when heart rate is better controlled - We will start Lovenox for atrial fibrillation, patient's family request patient be started on Coumadin if -Echocardiogram as there is none on file except from 2015 -Patient had a Holter monitor  in 2017 which showed premature atrial complexes and an atrial tachycardia -She was previously on metoprolol at home and was instructed to take an extra metoprolol when she felt like she had palpitations - CHADS Vasc score of 5 (Age, sex, HTN, Diabetes)  Congestive heart failure, unspecified type -Chest x-ray showing pulmonary vascular congestion -We will order echocardiogram -We will continue IV Lasix daily -Patient appears to be on a heart failure medication regimen of ACE inhibitor, beta-blocker, and diuretic  Essential hypertension -Patient's blood pressure is well controlled at this time on IV diltiazem -We will hold amlodipine and metoprolol given patient is on IV diltiazem  Coronary artery disease -Continue Crestor  Hypothyroidism -Continue levothyroxine  Depression and anxiety -Continue Cymbalta and Xanax  DVT prophylaxis: lovenox Code Status: Full code Family Communication:   Family is bedside Disposition Plan: will likely discharge back home when heart rate controlled and decision of anticoagulation made Consults called: none  Admission status: inpatient, sdu    Katrinka Blazing MD [facility] Pager [phone]  If 7PM-7AM, please contact night-coverage [email or web address] Password [facility]  05/05/2017, 3:42 PM

## 2017-05-05 NOTE — ED Triage Notes (Signed)
Also has rapid heart rate

## 2017-05-05 NOTE — ED Triage Notes (Signed)
According to family member, patient has facial swelling onset today, took a xanax and felt better

## 2017-05-05 NOTE — ED Notes (Signed)
Pt states also has COPD. Denies using nebs or inhalers. Pt diminished on right side.

## 2017-05-06 ENCOUNTER — Inpatient Hospital Stay (HOSPITAL_COMMUNITY): Payer: Medicare HMO

## 2017-05-06 DIAGNOSIS — I4891 Unspecified atrial fibrillation: Secondary | ICD-10-CM

## 2017-05-06 LAB — ECHOCARDIOGRAM COMPLETE
AVLVOTPG: 3 mmHg
CHL CUP DOP CALC LVOT VTI: 20.5 cm
EWDT: 176 ms
FS: 33 % (ref 28–44)
Height: 59 in
IVS/LV PW RATIO, ED: 0.81
LA diam end sys: 39 mm
LA diam index: 2.53 cm/m2
LA vol A4C: 57.8 ml
LA vol index: 36.8 mL/m2
LASIZE: 39 mm
LAVOL: 56.7 mL
LV SIMPSON'S DISK: 67
LV dias vol: 34 mL — AB (ref 46–106)
LV sys vol index: 7 mL/m2
LV sys vol: 11 mL — AB
LVDIAVOLIN: 22 mL/m2
LVOT area: 2.01 cm2
LVOT peak vel: 93.2 cm/s
LVOTD: 16 mm
LVOTSV: 41 mL
MV Dec: 176
MV Peak grad: 8 mmHg
MV pk E vel: 143 m/s
PW: 10.3 mm — AB (ref 0.6–1.1)
RV sys press: 35 mmHg
Reg peak vel: 284 cm/s
Stroke v: 23 ml
TAPSE: 13.2 mm
TR max vel: 284 cm/s
WEIGHTICAEL: 1971.79 [oz_av]

## 2017-05-06 LAB — BASIC METABOLIC PANEL
ANION GAP: 10 (ref 5–15)
BUN: 43 mg/dL — AB (ref 6–20)
CALCIUM: 8.7 mg/dL — AB (ref 8.9–10.3)
CO2: 24 mmol/L (ref 22–32)
Chloride: 108 mmol/L (ref 101–111)
Creatinine, Ser: 1.19 mg/dL — ABNORMAL HIGH (ref 0.44–1.00)
GFR calc Af Amer: 50 mL/min — ABNORMAL LOW (ref >60)
GFR, EST NON AFRICAN AMERICAN: 43 mL/min — AB (ref >60)
GLUCOSE: 127 mg/dL — AB (ref 65–99)
Potassium: 4.1 mmol/L (ref 3.5–5.1)
Sodium: 142 mmol/L (ref 135–145)

## 2017-05-06 LAB — MRSA PCR SCREENING: MRSA BY PCR: NEGATIVE

## 2017-05-06 LAB — CBC
HCT: 34.9 % — ABNORMAL LOW (ref 36.0–46.0)
HEMOGLOBIN: 11.2 g/dL — AB (ref 12.0–15.0)
MCH: 31.9 pg (ref 26.0–34.0)
MCHC: 32.1 g/dL (ref 30.0–36.0)
MCV: 99.4 fL (ref 78.0–100.0)
Platelets: 190 10*3/uL (ref 150–400)
RBC: 3.51 MIL/uL — ABNORMAL LOW (ref 3.87–5.11)
RDW: 13.5 % (ref 11.5–15.5)
WBC: 6.4 10*3/uL (ref 4.0–10.5)

## 2017-05-06 LAB — TROPONIN I

## 2017-05-06 LAB — HEMOGLOBIN A1C
HEMOGLOBIN A1C: 7.2 % — AB (ref 4.8–5.6)
MEAN PLASMA GLUCOSE: 159.94 mg/dL

## 2017-05-06 LAB — GLUCOSE, CAPILLARY
GLUCOSE-CAPILLARY: 112 mg/dL — AB (ref 65–99)
GLUCOSE-CAPILLARY: 134 mg/dL — AB (ref 65–99)
GLUCOSE-CAPILLARY: 173 mg/dL — AB (ref 65–99)
Glucose-Capillary: 107 mg/dL — ABNORMAL HIGH (ref 65–99)

## 2017-05-06 MED ORDER — PROMETHAZINE HCL 25 MG/ML IJ SOLN
12.5000 mg | Freq: Four times a day (QID) | INTRAMUSCULAR | Status: AC | PRN
  Administered 2017-05-06 – 2017-05-07 (×2): 12.5 mg via INTRAVENOUS
  Filled 2017-05-06 (×2): qty 1

## 2017-05-06 MED ORDER — HYDROCODONE-ACETAMINOPHEN 5-325 MG PO TABS
1.0000 | ORAL_TABLET | Freq: Four times a day (QID) | ORAL | Status: DC | PRN
Start: 2017-05-06 — End: 2017-05-25

## 2017-05-06 MED ORDER — WARFARIN VIDEO
Freq: Once | Status: AC
  Administered 2017-05-06: 09:00:00

## 2017-05-06 MED ORDER — ALUM & MAG HYDROXIDE-SIMETH 200-200-20 MG/5ML PO SUSP
30.0000 mL | ORAL | Status: DC | PRN
  Administered 2017-05-06: 30 mL via ORAL
  Filled 2017-05-06: qty 30

## 2017-05-06 MED ORDER — ONDANSETRON HCL 4 MG/2ML IJ SOLN
4.0000 mg | Freq: Four times a day (QID) | INTRAMUSCULAR | Status: DC | PRN
  Administered 2017-05-06 – 2017-05-07 (×4): 4 mg via INTRAVENOUS
  Filled 2017-05-06 (×4): qty 2

## 2017-05-06 MED ORDER — DILTIAZEM HCL 30 MG PO TABS
30.0000 mg | ORAL_TABLET | Freq: Three times a day (TID) | ORAL | Status: DC
  Administered 2017-05-06 – 2017-05-07 (×3): 30 mg via ORAL
  Filled 2017-05-06 (×3): qty 1

## 2017-05-06 MED ORDER — ACETAMINOPHEN 325 MG PO TABS
650.0000 mg | ORAL_TABLET | Freq: Four times a day (QID) | ORAL | Status: DC | PRN
  Administered 2017-05-08 – 2017-05-09 (×2): 650 mg via ORAL
  Filled 2017-05-06 (×2): qty 2

## 2017-05-06 MED ORDER — COUMADIN BOOK
Freq: Once | Status: AC
  Administered 2017-05-06: 09:00:00
  Filled 2017-05-06: qty 1

## 2017-05-06 MED ORDER — WARFARIN SODIUM 5 MG PO TABS
5.0000 mg | ORAL_TABLET | Freq: Once | ORAL | Status: AC
  Administered 2017-05-06: 5 mg via ORAL
  Filled 2017-05-06: qty 1

## 2017-05-06 MED ORDER — MORPHINE SULFATE (PF) 2 MG/ML IV SOLN
2.0000 mg | INTRAVENOUS | Status: AC | PRN
  Administered 2017-05-06 – 2017-05-07 (×2): 2 mg via INTRAVENOUS
  Filled 2017-05-06 (×2): qty 1

## 2017-05-06 NOTE — Progress Notes (Signed)
PROGRESS NOTE    Ellen Woods  WUJ:[name]:[id number] DOB: 12/18/1940 DOA: 05/05/2017 PCP: Woods, Elige RadonJoshua A, MD     Brief Narrative:  77 year old woman admitted from home on 1/1 due to palpitations.  Found to be in new onset A. fib with RVR.  Admission was requested.   Assessment & Plan:   Principal Problem:   Atrial fibrillation with RVR (HCC) Active Problems:   Mixed hyperlipidemia   Essential hypertension, benign   Chronic diastolic heart failure (HCC)   Diabetes type 2, uncontrolled (HCC)   Hypothyroidism   Uncontrolled type 2 diabetes mellitus with hyperglycemia (HCC)   Atrial fibrillation, new onset (HCC)   Atrial fibrillation with rapid ventricular response -Remains on Cardizem drip at 10 mg an hour.  Rates remain in the 110s. -We will start oral Cardizem today with plans to titrate Cardizem drip hopefully off today. -Her CHA DS VASC score is at least 5 and as such has been started on Coumadin with a Lovenox bridge (the family was not amenable to a new anticoagulant at least on admission, bears revisiting).  CHF, unspecified type -Suspect this is diastolic, however Echo is pending. -May be tachycardia mediated as well. -Plan to continue IV Lasix at current dose and continue to strive for negative fluid balance. -She is 770 cc  negative since admission.  Hypothyroidism -Continue Synthroid  Hyperlipidemia -Continue Crestor  Depression and anxiety -Continue Cymbalta and Xanax  Hypertension -Blood pressure has been low normal while on Cardizem drip   DVT prophylaxis: Lovenox Code Status: Full code Family Communication: Patient only Disposition Plan: Anticipate discharge home pending medical stability  Consultants:   None  Procedures:   2D echo pending  Antimicrobials:  Anti-infectives (From admission, onward)   None       Subjective: No longer feels objective palpitations, denies chest pain, shortness of breath is improved.  Objective: Vitals:    05/06/17 1445 05/06/17 1500 05/06/17 1515 05/06/17 1530  BP: (!) 89/68 111/66 (!) 102/48 (!) 89/60  Pulse: (!) 142 (!) 130 (!) 131 (!) 121  Resp: (!) 28 18 (!) 23 (!) 23  Temp:      TempSrc:      SpO2: 92% 92% 94% 93%  Weight:      Height:        Intake/Output Summary (Last 24 hours) at 05/06/2017 1554 Last data filed at 05/06/2017 1155 Gross per 24 hour  Intake 329.8 ml  Output 1100 ml  Net -770.2 ml   Filed Weights   05/05/17 1248 05/05/17 2126 05/06/17 0435  Weight: 54.4 kg (120 lb) 55.8 kg (123 lb 0.3 oz) 55.9 kg (123 lb 3.8 oz)    Examination:  General exam: Alert, awake, oriented x 3 Respiratory system: Mild bibasilar crackles Cardiovascular system: Tachycardic, irregular Gastrointestinal system: Abdomen is nondistended, soft and nontender. No organomegaly or masses felt. Normal bowel sounds heard. Central nervous system: Alert and oriented. No focal neurological deficits. Extremities: Trace bilateral pitting edema, positive pedal pulses Skin: No rashes, lesions or ulcers Psychiatry: Judgement and insight appear normal. Mood & affect appropriate.     Data Reviewed: I have personally reviewed following labs and imaging studies  CBC: Recent Labs  Lab 05/05/17 1322 05/06/17 0325  WBC 9.0 6.4  NEUTROABS 7.6  --   HGB 12.0 11.2*  HCT 38.9 34.9*  MCV 101.0* 99.4  PLT 226 190   Basic Metabolic Panel: Recent Labs  Lab 05/05/17 1322 05/06/17 0325  NA 138 142  K 4.8 4.1  CL  105 108  CO2 20* 24  GLUCOSE 314* 127*  BUN 42* 43*  CREATININE 1.16* 1.19*  CALCIUM 8.9 8.7*   GFR: Estimated Creatinine Clearance: 30.7 mL/min (A) (by C-G formula based on SCr of 1.19 mg/dL (H)). Liver Function Tests: No results for input(s): AST, ALT, ALKPHOS, BILITOT, PROT, ALBUMIN in the last 168 hours. No results for input(s): LIPASE, AMYLASE in the last 168 hours. No results for input(s): AMMONIA in the last 168 hours. Coagulation Profile: Recent Labs  Lab 05/05/17 2251    INR 1.07   Cardiac Enzymes: Recent Labs  Lab 05/05/17 1322 05/05/17 2141 05/06/17 0325 05/06/17 1039  TROPONINI <0.03 <0.03 <0.03 <0.03   BNP (last 3 results) No results for input(s): PROBNP in the last 8760 hours. HbA1C: Recent Labs    05/05/17 1322  HGBA1C 7.2*   CBG: Recent Labs  Lab 05/05/17 2014 05/05/17 2211 05/06/17 0805 05/06/17 1112 05/06/17 1505  GLUCAP 290* 206* 112* 134* 107*   Lipid Profile: No results for input(s): CHOL, HDL, LDLCALC, TRIG, CHOLHDL, LDLDIRECT in the last 72 hours. Thyroid Function Tests: No results for input(s): TSH, T4TOTAL, FREET4, T3FREE, THYROIDAB in the last 72 hours. Anemia Panel: No results for input(s): VITAMINB12, FOLATE, FERRITIN, TIBC, IRON, RETICCTPCT in the last 72 hours. Urine analysis:    Component Value Date/Time   COLORURINE YELLOW 03/14/2014 1227   APPEARANCEUR CLEAR 03/14/2014 1227   LABSPEC 1.015 03/14/2014 1227   PHURINE 5.0 03/14/2014 1227   GLUCOSEU 100 (A) 03/14/2014 1227   HGBUR NEGATIVE 03/14/2014 1227   BILIRUBINUR neg 06/13/2015 1710   KETONESUR NEGATIVE 03/14/2014 1227   PROTEINUR neg 06/13/2015 1710   PROTEINUR NEGATIVE 03/14/2014 1227   UROBILINOGEN negative 06/13/2015 1710   UROBILINOGEN 0.2 03/14/2014 1227   NITRITE neg 06/13/2015 1710   NITRITE NEGATIVE 03/14/2014 1227   LEUKOCYTESUR Negative 06/13/2015 1710   Sepsis Labs: @LABRCNTIP (procalcitonin:4,lacticidven:4)  ) Recent Results (from the past 240 hour(s))  MRSA PCR Screening     Status: None   Collection Time: 05/05/17  9:20 PM  Result Value Ref Range Status   MRSA by PCR NEGATIVE NEGATIVE Final    Comment:        The GeneXpert MRSA Assay (FDA approved for NASAL specimens only), is one component of a comprehensive MRSA colonization surveillance program. It is not intended to diagnose MRSA infection nor to guide or monitor treatment for MRSA infections.          Radiology Studies: Dg Chest Port 1 View  Result Date:  05/05/2017 CLINICAL DATA:  Chest pain EXAM: PORTABLE CHEST 1 VIEW COMPARISON:  10/12/2016 FINDINGS: Cardiac shadow remains enlarged. Aortic calcifications are noted. Vascular congestion with interstitial edema and bilateral pleural effusions right greater than left is noted. No bony abnormality is seen. IMPRESSION: Changes of CHF. Electronically Signed   By: Alcide Clever M.D.   On: 05/05/2017 13:54        Scheduled Meds: . diltiazem  30 mg Oral Q8H  . DULoxetine  30 mg Oral Daily  . enoxaparin (LOVENOX) injection  60 mg Subcutaneous Q12H  . insulin aspart  0-9 Units Subcutaneous TID WC  . insulin glargine  15 Units Subcutaneous Q breakfast  . levothyroxine  50 mcg Oral QAC breakfast  . rosuvastatin  5 mg Oral Daily  . warfarin  5 mg Oral Once  . Warfarin - Pharmacist Dosing Inpatient   Does not apply q1800   Continuous Infusions: . diltiazem (CARDIZEM) infusion 10 mg/hr (05/06/17 1426)  LOS: 1 day    Time spent: 25 minutes. Greater than 50% of this time was spent in direct contact with the patient coordinating care.     Chaya Jan, MD [facility] Pager [phone]  If 7PM-7AM, please contact night-coverage [email or web address] Password [facility] 05/06/2017, 3:54 PM

## 2017-05-06 NOTE — Progress Notes (Signed)
Inpatient Diabetes Program Recommendations  AACE/ADA: New Consensus Statement on Inpatient Glycemic Control (2015)  Target Ranges:  Prepandial:   less than 140 mg/dL      Peak postprandial:   less than 180 mg/dL (1-2 hours)      Critically ill patients:  140 - 180 mg/dL   Results for Ellen Woods, Ellen Woods (MRN [id number]) as of 05/06/2017 10:52  Ref. Range 05/05/2017 19:22 05/05/2017 20:14 05/05/2017 22:11 05/06/2017 08:05  Glucose-Capillary Latest Ref Range: 65 - 99 mg/dL 956312 (H) 213290 (H) 086206 (H) 112 (H)  Results for Ellen Woods, Ellen Woods (MRN [id number]) as of 05/06/2017 10:52  Ref. Range 09/03/2016 00:00 03/03/2017 00:00  Hemoglobin A1C Unknown 8.1 (H) 7.5   Review of Glycemic Control  Outpatient Diabetes medications: Lantus 14 units QAM, Novolog 3-5 units TID with meals Current orders for Inpatient glycemic control: Lantus 15 units QAM, Novolog 0-9 units TID with meals  Inpatient Diabetes Program Recommendations: Correction (SSI): Please consider ordering Novolog 0-5 units QHS for bedtime correction.  Thanks, Orlando PennerMarie Malaney Mcbean, RN, MSN, CDE Diabetes Coordinator Inpatient Diabetes Program [phone] (Team Pager from 8am to 5pm)

## 2017-05-06 NOTE — Progress Notes (Signed)
PT Cancellation Note  Patient Details Name: Ellen HaverGlenda J Woods MRN: [id number] DOB: 11/12/1940   Cancelled Treatment:    Reason Eval/Treat Not Completed: Medical issues which prohibited therapy.  Physical therapy held per RN request secondary to patient having high HR while at rest.  Will check back tomorrow.   3:26 PM, 05/06/17 Ocie BobJames Tennyson Wacha, MPT Physical Therapist [facility] [phone] office [phone] mobile phone

## 2017-05-06 NOTE — Progress Notes (Signed)
Pt SBP 90's all night. Pt asymptomatic. HR increases with activity. [facility] and [facility] from 80-120. No dizziness or SOB. When O2 removed pt desats to 88-90. 2L [place] placed back on pt.   Genelle Balameron D Daesean Lazarz, RN

## 2017-05-06 NOTE — Progress Notes (Signed)
*  PRELIMINARY RESULTS* Echocardiogram 2D Echocardiogram has been performed.  Stacey DrainWhite, Sabatino Williard J 05/06/2017, 2:09 PM

## 2017-05-07 DIAGNOSIS — I5033 Acute on chronic diastolic (congestive) heart failure: Secondary | ICD-10-CM

## 2017-05-07 DIAGNOSIS — I509 Heart failure, unspecified: Secondary | ICD-10-CM

## 2017-05-07 DIAGNOSIS — E1165 Type 2 diabetes mellitus with hyperglycemia: Secondary | ICD-10-CM

## 2017-05-07 DIAGNOSIS — I5032 Chronic diastolic (congestive) heart failure: Secondary | ICD-10-CM

## 2017-05-07 DIAGNOSIS — K5900 Constipation, unspecified: Secondary | ICD-10-CM

## 2017-05-07 DIAGNOSIS — I4891 Unspecified atrial fibrillation: Secondary | ICD-10-CM

## 2017-05-07 DIAGNOSIS — R1115 Cyclical vomiting syndrome unrelated to migraine: Secondary | ICD-10-CM

## 2017-05-07 DIAGNOSIS — G43A1 Cyclical vomiting, intractable: Secondary | ICD-10-CM

## 2017-05-07 LAB — PROTIME-INR
INR: 1.64
PROTHROMBIN TIME: 19.2 s — AB (ref 11.4–15.2)

## 2017-05-07 LAB — GLUCOSE, CAPILLARY
GLUCOSE-CAPILLARY: 167 mg/dL — AB (ref 65–99)
Glucose-Capillary: 200 mg/dL — ABNORMAL HIGH (ref 65–99)
Glucose-Capillary: 169 mg/dL — ABNORMAL HIGH (ref 65–99)
Glucose-Capillary: 118 mg/dL — ABNORMAL HIGH (ref 65–99)

## 2017-05-07 MED ORDER — METOPROLOL TARTRATE 5 MG/5ML IV SOLN
5.0000 mg | Freq: Once | INTRAVENOUS | Status: AC
  Administered 2017-05-07: 5 mg via INTRAVENOUS
  Filled 2017-05-07: qty 5

## 2017-05-07 MED ORDER — SODIUM CHLORIDE 0.9 % IV BOLUS (SEPSIS)
250.0000 mL | Freq: Once | INTRAVENOUS | Status: AC
  Administered 2017-05-07: 250 mL via INTRAVENOUS

## 2017-05-07 MED ORDER — PANTOPRAZOLE SODIUM 40 MG PO TBEC: 40.0000 mg | DELAYED_RELEASE_TABLET | Freq: Every day | ORAL | Status: DC

## 2017-05-07 MED ORDER — LINACLOTIDE 145 MCG PO CAPS
145.0000 ug | ORAL_CAPSULE | Freq: Every day | ORAL | Status: DC
  Administered 2017-05-08 – 2017-05-10 (×3): 145 ug via ORAL
  Filled 2017-05-07 (×3): qty 1

## 2017-05-07 MED ORDER — COUMADIN BOOK
Freq: Once | Status: AC
  Filled 2017-05-07: qty 1

## 2017-05-07 MED ORDER — AMIODARONE HCL IN DEXTROSE 360-4.14 MG/200ML-% IV SOLN
60.0000 mg/h | INTRAVENOUS | Status: AC
  Administered 2017-05-07: 60 mg/h via INTRAVENOUS
  Filled 2017-05-07: qty 200

## 2017-05-07 MED ORDER — ONDANSETRON HCL 4 MG/2ML IJ SOLN
4.0000 mg | Freq: Three times a day (TID) | INTRAMUSCULAR | Status: DC | PRN
  Administered 2017-05-09: 4 mg via INTRAVENOUS
  Filled 2017-05-07: qty 2

## 2017-05-07 MED ORDER — AMIODARONE IV BOLUS ONLY 150 MG/100ML
150.0000 mg | Freq: Once | INTRAVENOUS | Status: AC
  Administered 2017-05-07: 150 mg via INTRAVENOUS
  Filled 2017-05-07: qty 100

## 2017-05-07 MED ORDER — ONDANSETRON HCL 4 MG/2ML IJ SOLN
4.0000 mg | Freq: Three times a day (TID) | INTRAMUSCULAR | Status: DC
  Administered 2017-05-07: 4 mg via INTRAVENOUS
  Filled 2017-05-07: qty 2

## 2017-05-07 MED ORDER — OFF THE BEAT BOOK
Freq: Once | Status: AC
  Administered 2017-05-07: 09:00:00
  Filled 2017-05-07: qty 1

## 2017-05-07 MED ORDER — FUROSEMIDE 10 MG/ML IJ SOLN
40.0000 mg | Freq: Two times a day (BID) | INTRAMUSCULAR | Status: AC
  Administered 2017-05-07 (×2): 40 mg via INTRAVENOUS
  Filled 2017-05-07 (×2): qty 4

## 2017-05-07 MED ORDER — SALINE SPRAY 0.65 % NA SOLN: 1.0000 | NASAL | Status: DC | PRN

## 2017-05-07 MED ORDER — METOCLOPRAMIDE HCL 5 MG/ML IJ SOLN
5.0000 mg | Freq: Three times a day (TID) | INTRAMUSCULAR | Status: DC
  Administered 2017-05-07 – 2017-05-08 (×3): 5 mg via INTRAVENOUS
  Filled 2017-05-07 (×3): qty 2

## 2017-05-07 MED ORDER — AMIODARONE HCL IN DEXTROSE 360-4.14 MG/200ML-% IV SOLN
30.0000 mg/h | INTRAVENOUS | Status: DC
  Administered 2017-05-07 – 2017-05-11 (×9): 30 mg/h via INTRAVENOUS
  Filled 2017-05-07 (×8): qty 200

## 2017-05-07 MED ORDER — PANTOPRAZOLE SODIUM 40 MG IV SOLR
40.0000 mg | INTRAVENOUS | Status: DC
  Administered 2017-05-07: 40 mg via INTRAVENOUS
  Filled 2017-05-07: qty 40

## 2017-05-07 MED ORDER — WARFARIN SODIUM 2.5 MG PO TABS
2.5000 mg | ORAL_TABLET | Freq: Once | ORAL | Status: AC
  Administered 2017-05-07: 2.5 mg via ORAL
  Filled 2017-05-07: qty 1

## 2017-05-07 MED ORDER — ENOXAPARIN SODIUM 60 MG/0.6ML ~~LOC~~ SOLN
55.0000 mg | Freq: Two times a day (BID) | SUBCUTANEOUS | Status: DC
  Administered 2017-05-07 – 2017-05-08 (×2): 55 mg via SUBCUTANEOUS
  Filled 2017-05-07 (×3): qty 0.6

## 2017-05-07 MED ORDER — PANTOPRAZOLE SODIUM 40 MG IV SOLR
40.0000 mg | Freq: Two times a day (BID) | INTRAVENOUS | Status: DC
  Administered 2017-05-07 – 2017-05-17 (×20): 40 mg via INTRAVENOUS
  Filled 2017-05-07 (×20): qty 40

## 2017-05-07 MED ORDER — DIGOXIN 0.25 MG/ML IJ SOLN
0.2500 mg | Freq: Once | INTRAMUSCULAR | Status: AC
  Administered 2017-05-07: 0.25 mg via INTRAVENOUS
  Filled 2017-05-07: qty 2

## 2017-05-07 NOTE — Progress Notes (Signed)
PT Cancellation Note  Patient Details Name: Melody HaverGlenda J Faller MRN: [id number] DOB: 05/15/1940   Cancelled Treatment:    Reason Eval/Treat Not Completed: Medical issues which prohibited therapy.  Patient continues to have high HR with exertion and put on hold by RN.  PLAN: will discharge physical therapy order and recommend re-ordering physical therapy when patient's HR under control and appropriate for out of bed activities.     9:14 AM, 05/07/17 Ocie BobJames Flavio Lindroth, MPT Physical Therapist with [facility] [phone] office [phone] mobile phone

## 2017-05-07 NOTE — Progress Notes (Signed)
ANTICOAGULATION CONSULT NOTE  Pharmacy Consult for Lovenox and Coumadin Indication: atrial fibrillation  Allergies  Allergen Reactions  . Codeine     REACTION: nausea  . Invokana [Canagliflozin]   . Naproxen Other (See Comments)    Abdominal pain  . Azithromycin Other (See Comments)    Hospital reaction   Patient Measurements: Height: 4\' 11"  (149.9 cm) Weight: 124 lb 1.9 oz (56.3 kg) IBW/kg (Calculated) : 43.2  Vital Signs: Temp: 98.2 F (36.8 C) (01/03 0400) Temp Source: Oral (01/03 0400) BP: 109/57 (01/03 0715) Pulse Rate: 142 (01/03 0715)  Labs: Recent Labs    05/05/17 1322 05/05/17 2141 05/05/17 2251 05/06/17 0325 05/06/17 1039 05/07/17 0643  HGB 12.0  --   --  11.2*  --   --   HCT 38.9  --   --  34.9*  --   --   PLT 226  --   --  190  --   --   LABPROT  --   --  13.8  --   --  19.2*  INR  --   --  1.07  --   --  1.64  CREATININE 1.16*  --   --  1.19*  --   --   TROPONINI <0.03 <0.03  --  <0.03 <0.03  --    Estimated Creatinine Clearance: 30.7 mL/min (A) (by C-G formula based on SCr of 1.19 mg/dL (H)).  No bleeding noted.  INR trending up.  Assessment: 77 yo female presented to ED with new onset afib. Okay for protocol. Lovenox to bridge with coumadin.  Goal of Therapy:  INR 2-3 Monitor platelets by anticoagulation protocol: Yes   Plan:  Coumadin 2.5mg  po x 1 Lovenox 1mg /kg (55mg ) sq q12h PT-INR daily Monitor for S/S of bleeding Coumadin education  Mady GemmaHayes, Leyton Brownlee R, [facility] 05/07/2017,7:49 AM

## 2017-05-07 NOTE — Progress Notes (Signed)
250mL Bolus given. BP improved. Pt now has phenergan and IV morphine on board due to her nausea. Pt is vomiting very small amount of clear fluid. Pt states she feels really sick.   When awake and moving around, pt HR 130-140. Cardizem gtt titrated up to 15mg /hr.   Genelle Balameron D Allisyn Kunz, RN

## 2017-05-07 NOTE — Consult Note (Signed)
Cardiology Consult    Patient ID: ESTALEE MCCANDLISH; [id number]; 1940-08-20   Admit date: 05/05/2017 Date of Consult: 05/07/2017  Primary Care Provider: Dettinger, Elige Radon, MD Primary Cardiologist: Dr. Antoine Poche  Patient Profile    Ellen Woods is a 77 y.o. female with past medical history of CAD (nonobstructive CAD by cath in 2012 with low-risk NST in 12/2016), HTN, HLD, Type 2 DM, and COPD who is being seen today for the evaluation of atrial fibrillation with RVR at the request of Dr. Laural Benes.   History of Present Illness    Ellen Woods was last examined by Dr. Antoine Poche in 02/2017 and reported having baseline dyspnea on exertion but denied any recent chest pain or palpitations. She was continued on her current medication regimen at that time.   She presented to [facility] ED on 05/05/2016 for evaluation of palpitations and facial swelling which had started earlier in the day. She was unaware of palpitations prior to this but reports feeling "drained" for the past several weeks. While in the ED, she was found to be in atrial fibrillation with RVR and was started on IV Cardizem for rate-control.   Initial labs showed WBC of 9.0, Hgb 12.0, platelets 226, Na+ 138, K+ 4.8, creatinine 1.16. Initial and cyclic troponin values have been negative. CXR was consistent with CHF. An echocardiogram was obtained on 05/06/2016 and showed a preserved EF of 60-65%, no regional WMA, mild MR, and a moderately dilated LA.   Attempts were made to wean her off of IV Cardizem on 05/06/2016 but HR remained difficult to control with PO medications and the use of IV Digoxin, therefore she was restarted on the drip. Her HR is currently in the 90's to low-100's. She did note palpitations overnight when HR was in the 140's but denies any current symptoms.   She has been started on Coumadin with Lovenox bridging and denies any evidence of active bleeding.    Past Medical History:  Diagnosis Date  . Arthritis   . Back  pain 1/14   Tx by orthopedics  . COPD (chronic obstructive pulmonary disease) (HCC)   . Coronary atherosclerosis of native coronary artery    50% LAD stenosis  . Encounter for long-term (current) use of insulin (HCC)   . Family history of anesthesia complication    Daughter has nausea  . PDA (patent ductus arteriosus)    Small PDA documented by CT angiography, no pulmonary hypertension  . Pericardial calcification    Ruled out for restrictive pericarditis  . Thyroid disease   . Type II or unspecified type diabetes mellitus without mention of complication, not stated as uncontrolled   . Unspecified essential hypertension     Past Surgical History:  Procedure Laterality Date  . ABDOMINAL HYSTERECTOMY     partial  . INCISIONAL HERNIA REPAIR    . INTRAMEDULLARY (IM) NAIL INTERTROCHANTERIC Left 11/13/2012   Procedure: INTRAMEDULLARY (IM) NAIL INTERTROCHANTRIC LEFT HIP;  Surgeon: Velna Ochs, MD;  Location: [facility] OR;  Service: Orthopedics;  Laterality: Left;  . TUBAL LIGATION       Home Medications:  Prior to Admission medications   Medication Sig Start Date End Date Taking? Authorizing Provider  acetaminophen (TYLENOL) 325 MG tablet Take 2 tablets (650 mg total) by mouth every 6 (six) hours as needed. 11/18/12  Yes Rai, Ripudeep K, MD  ALPRAZolam (XANAX) 0.5 MG tablet Take 1 tablet (0.5 mg total) by mouth 2 (two) times daily as needed. 02/26/17  Yes  Dettinger, Elige Radon, MD  amLODipine (NORVASC) 5 MG tablet Take 1 tablet (5 mg total) daily by mouth. For blood pressure 03/10/17  Yes Dettinger, Elige Radon, MD  aspirin 81 MG tablet Take 81 mg by mouth daily.   Yes [provider]  Calcium Carb-Cholecalciferol (CALCIUM 500 + D3) 500-600 MG-UNIT TABS Take 1 tablet by mouth daily. 06/06/15  Yes Henrene Pastor, PharmD  DULoxetine (CYMBALTA) 30 MG capsule Take 1 capsule (30 mg total) by mouth daily. 03/25/17  Yes Dettinger, Elige Radon, MD  furosemide (LASIX) 40 MG tablet 20 mg. Take one tablet  by mouth as needed 01/01/17  Yes [provider]  Insulin Glargine (LANTUS SOLOSTAR) 100 UNIT/ML Solostar Pen Inject 15 Units into the skin daily with breakfast. Patient taking differently: Inject 14 Units into the skin daily with breakfast.  02/04/17  Yes Nida, Denman George, MD  KLOR-CON M20 20 MEQ tablet TAKE 1 TABLET EVERY DAY 02/03/17  Yes Dettinger, Elige Radon, MD  levothyroxine (SYNTHROID, LEVOTHROID) 50 MCG tablet TAKE 1 TABLET EVERY DAY BEFORE BREAKFAST 02/17/17  Yes Nida, Denman George, MD  lisinopril (PRINIVIL,ZESTRIL) 40 MG tablet Take 1 tablet (40 mg total) by mouth daily. 12/08/16  Yes Laqueta Linden, MD  metoprolol tartrate (LOPRESSOR) 25 MG tablet TAKE 1 TABLET TWICE DAILY 02/03/17  Yes Dettinger, Elige Radon, MD  NITROSTAT 0.4 MG SL tablet DISSOLVE  1 TABLET  UNDER THE TONGUE EVERY 5 MINUTES AS NEEDED FOR CHEST PAIN 01/16/17  Yes Stacks, Broadus John, MD  NOVOLOG FLEXPEN 100 UNIT/ML FlexPen INJECT 3 TO 5 UNITS SUBCUTANEOUSLY THREE TIMES DAILY WITH MEALS 12/16/16  Yes Nida, Denman George, MD  PROLIA 60 MG/ML SOLN injection Inject 60 mg into the skin every 6 (six) months. To be administered 08/20/2016 07/25/16  Yes Stacks, Broadus John, MD  rosuvastatin (CRESTOR) 5 MG tablet Take 1 tablet (5 mg total) daily by mouth. 03/10/17 06/08/17 Yes Dettinger, Elige Radon, MD    Inpatient Medications: Scheduled Meds: . diltiazem  30 mg Oral Q8H  . DULoxetine  30 mg Oral Daily  . enoxaparin (LOVENOX) injection  55 mg Subcutaneous Q12H  . insulin aspart  0-9 Units Subcutaneous TID WC  . insulin glargine  15 Units Subcutaneous Q breakfast  . levothyroxine  50 mcg Oral QAC breakfast  . off the beat book   Does not apply Once  . pantoprazole (PROTONIX) IV  40 mg Intravenous Q24H  . rosuvastatin  5 mg Oral Daily  . warfarin  2.5 mg Oral Once  . Warfarin - Pharmacist Dosing Inpatient   Does not apply q1800   Continuous Infusions: . diltiazem (CARDIZEM) infusion 15 mg/hr (05/07/17 0415)   PRN  Meds: acetaminophen, ALPRAZolam, alum & mag hydroxide-simeth, HYDROcodone-acetaminophen, ondansetron (ZOFRAN) IV, sodium chloride  Allergies:    Allergies  Allergen Reactions  . Codeine     REACTION: nausea  . Invokana [Canagliflozin]   . Naproxen Other (See Comments)    Abdominal pain  . Azithromycin Other (See Comments)    Hospital reaction    Social History:   Social History   Socioeconomic History  . Marital status: Widowed    Spouse name: Not on file  . Number of children: 2  . Years of education: Not on file  . Highest education level: Not on file  Social Needs  . Financial resource strain: Not on file  . Food insecurity - worry: Not on file  . Food insecurity - inability: Not on file  . Transportation needs - medical: Not  on file  . Transportation needs - non-medical: Not on file  Occupational History  . Not on file  Tobacco Use  . Smoking status: Never Smoker  . Smokeless tobacco: Never Used  Substance and Sexual Activity  . Alcohol use: No  . Drug use: No  . Sexual activity: No  Other Topics Concern  . Not on file  Social History Narrative  . Not on file     Family History:    Family History  Problem Relation Age of Onset  . Stroke Mother   . Hypertension Mother   . Diabetes Brother   . Early death Brother   . Heart disease Daughter   . Stroke Daughter   . Congenital heart disease Daughter   . Diabetes Son 719  . Stroke Brother   . Diabetes Brother 6       type 1      Review of Systems    General:  No chills, fever, night sweats or weight changes.  Cardiovascular:  No chest pain, dyspnea on exertion, edema, orthopnea, paroxysmal nocturnal dyspnea. Positive for palpitations.  Dermatological: No rash, lesions/masses Respiratory: No cough, dyspnea Urologic: No hematuria, dysuria Abdominal:   No nausea, vomiting, diarrhea, bright red blood per rectum, melena, or hematemesis Neurologic:  No visual changes, wkns, changes in mental status. All  other systems reviewed and are otherwise negative except as noted above.  Physical Exam/Data    Vitals:   05/07/17 0715 05/07/17 0730 05/07/17 0745 05/07/17 0800  BP: (!) 109/57 (!) 104/55 (!) 112/59 102/61  Pulse: (!) 142 (!) 141 (!) 141 (!) 131  Resp: 15 15 15  (!) 23  Temp:      TempSrc:      SpO2: 96% 96% 96% (!) 87%  Weight:      Height:        Intake/Output Summary (Last 24 hours) at 05/07/2017 0828 Last data filed at 05/07/2017 0600 Gross per 24 hour  Intake 367.21 ml  Output 600 ml  Net -232.79 ml   Filed Weights   05/05/17 2126 05/06/17 0435 05/07/17 0500  Weight: 123 lb 0.3 oz (55.8 kg) 123 lb 3.8 oz (55.9 kg) 124 lb 1.9 oz (56.3 kg)   Body mass index is 25.07 kg/m.   General: Pleasant, Caucasian female appearing in NAD Psych: Normal affect. Neuro: Alert and oriented X 3. Moves all extremities spontaneously. HEENT: Normal  Neck: Supple without bruits or JVD. Lungs:  Resp regular and unlabored, CTA without wheezing or rales. Heart: Irregularly irregular, no s3, s4, or murmurs. Abdomen: Soft, non-tender, non-distended, BS + x 4.  Extremities: No clubbing, cyanosis or edema. DP/PT/Radials 2+ and equal bilaterally.   EKG:  The EKG was personally reviewed and demonstrates: Atrial fibrillation with RVR, HR 134. Telemetry:  Telemetry was personally reviewed and demonstrates: Atrial fibrillation, HR in 90's to 140's.   Labs/Studies     Relevant CV Studies:  Echocardiogram: 05/06/2017 Study Conclusions  - Left ventricle: The cavity size was normal. Wall thickness was   normal. Systolic function was normal. The estimated ejection   fraction was in the range of 60% to 65%. Wall motion was normal;   there were no regional wall motion abnormalities. - Aortic valve: Mildly calcified annulus. Trileaflet; normal   thickness leaflets. Valve area (VTI): 1.87 cm^2. - Mitral valve: There was mild regurgitation. - Left atrium: The atrium was moderately dilated. - Right  atrium: The atrium was mildly dilated. - Atrial septum: No defect or patent foramen ovale  was identified. - Pulmonary arteries: Systolic pressure was mildly to moderately   increased. PA peak pressure: 40 mm Hg (S).   Laboratory Data:  Chemistry Recent Labs  Lab 05/05/17 1322 05/06/17 0325  NA 138 142  K 4.8 4.1  CL 105 108  CO2 20* 24  GLUCOSE 314* 127*  BUN 42* 43*  CREATININE 1.16* 1.19*  CALCIUM 8.9 8.7*  GFRNONAA 45* 43*  GFRAA 52* 50*  ANIONGAP 13 10    No results for input(s): PROT, ALBUMIN, AST, ALT, ALKPHOS, BILITOT in the last 168 hours. Hematology Recent Labs  Lab 05/05/17 1322 05/06/17 0325  WBC 9.0 6.4  RBC 3.85* 3.51*  HGB 12.0 11.2*  HCT 38.9 34.9*  MCV 101.0* 99.4  MCH 31.2 31.9  MCHC 30.8 32.1  RDW 13.7 13.5  PLT 226 190   Cardiac Enzymes Recent Labs  Lab 05/05/17 1322 05/05/17 2141 05/06/17 0325 05/06/17 1039  TROPONINI <0.03 <0.03 <0.03 <0.03   No results for input(s): TROPIPOC in the last 168 hours.  BNPNo results for input(s): BNP, PROBNP in the last 168 hours.  DDimer No results for input(s): DDIMER in the last 168 hours.  Radiology/Studies:  Dg Chest Port 1 View  Result Date: 05/05/2017 CLINICAL DATA:  Chest pain EXAM: PORTABLE CHEST 1 VIEW COMPARISON:  10/12/2016 FINDINGS: Cardiac shadow remains enlarged. Aortic calcifications are noted. Vascular congestion with interstitial edema and bilateral pleural effusions right greater than left is noted. No bony abnormality is seen. IMPRESSION: Changes of CHF. Electronically Signed   By: Alcide Clever M.D.   On: 05/05/2017 13:54     Assessment & Plan    1. New-onset Atrial Fibrillation with RVR - presented with new-onset palpitations and was found to be in atrial fibrillation with RVR with HR in the 150's. Initial labs show K+ 4.8 and creatinine 1.16. TSH 2.2 in 02/2017. Mg is pending. Initial and cyclic troponin values have been negative. Echo shows a preserved EF of 60-65%, no regional  WMA, mild MR, and a moderately dilated LA.  - remains on IV Cardizem as HR peaked into the 140's overnight with PO dosing. Hypotension has limited the addition of BB therapy. Would consider a short-course of Amiodarone given her elevated HR and hypotension. Dr. [name] to see later today.  - This patients CHA2DS2-VASc Score and unadjusted Ischemic Stroke Rate (% per year) is equal to 7.2 % stroke rate/year from a score of 5 (HTN, Female, Vascular, Age (2)). She has been started on Coumadin with Lovenox bridge. Appreciate Pharmacy's assistant with dosing.   2. CAD - nonobstructive CAD by cath in 2012 with low-risk NST in 12/2016.  - she denies any recent chest pain or dyspnea on exertion.  - no further ischemic evaluation indicated at this time.   3. HTN - BP has been soft at 69/43 - 125/98 within the past 24 hours, improved to 97/47 on most recent check. - PTA Amlodipine has been held as she is currently on IV Cardizem for rate-control.   4. HLD - remains on PTA Crestor 5mg  daily.    For questions or updates, please contact [facility] Please consult [email or web address] for contact info under Cardiology/STEMI.  Signed, Ellsworth Lennox, PA-C 05/07/2017, 8:28 AM Pager: [phone]  Attending note Patient seen and discussed with PA Iran Ouch, I agree with her documentation above. History of nonobstructive CAD by cath 2012, COPD, DM2, HTN, admitted with palpitations.  Diagnosed with new onset afib, initailyl managed with IV dilt gtt, rates have remained elevated  and have not been able to wean drip. Given 0.25mg  of IV digoxin this AM and 5mg  of IV lopressor. Rate control limited by soft bp's. She has CAD which limits antiarrhythmic options. Best option at this time would be amiodarone IV load and transition to oral. Started on coumadin for stroke prevention per patient preference. Will d/c dilt gtt, start amio gtt.  Also with pulmonary edema on CXR on admission, Echo with LVEF 60-65%, cannot eval  diastolic function due to afib but suggested by biatrial enlargement and elevated PASP, dilated IVC. Likely diastolic HF exacerbated by afib. I/Os incomplete this admit, received single dose of IV lasix on admission. On my exam I feel she remains volume overloaded, will dose IV lasix 40mg  bid today and reassess tomorrow.   Dina Rich MD

## 2017-05-07 NOTE — Consult Note (Signed)
Referring Provider: No ref. provider found Primary Care Physician:  Dettinger, Elige Radon, MD Primary Gastroenterologist:  Dr. Leone Payor ([facility])  Date of Admission: 05/05/17 Date of Consultation: 05/07/17  Reason for Consultation:  Intractable N/V  HPI:  Ellen Woods is a 77 y.o. female with a past medical history of COPD, CAD, diabetes on insulin, PDA, pericardial calcification, thyroid disease, hypertension.  She presented with complaints of palpitations and was admitted for new onset A. fib with RVR.  She was started on Cardizem drip.  She was started on Coumadin due to a CHADS VASC score of at least 5.  Also apparent compensated CHF. Attempt to wean off cardizem was unsucessful and it was restarted.  Today she states she began having nausea approximately yesterday afternoon.  She has had vomiting as well, last episode of emesis about 5 minutes ago.  She has had this once remotely in the past.  Does have lower abdominal crampy pain and left upper quadrant pain as well.  Some reflux symptoms going on at this time also.  States she has chronic constipation.  Her sister who is at her bedside with her agrees.  Today she states she has not had a bowel movement since Sunday (approximately 4 days ago).  She notes that she has not really had much to eat since then.  However, her sister adds that she is always constipated.  She occasionally takes over-the-counter laxative to help.  She did have an episode of impaction at least once in the past couple years.  Denies hematochezia or melena recently.  No other GI complaints at this time.  She is not currently on any chronic pain medications.  Past Medical History:  Diagnosis Date  . Arthritis   . Back pain 1/14   Tx by orthopedics  . COPD (chronic obstructive pulmonary disease) (HCC)   . Coronary atherosclerosis of native coronary artery    50% LAD stenosis  . Encounter for long-term (current) use of insulin (HCC)   . Family history of anesthesia  complication    Daughter has nausea  . PDA (patent ductus arteriosus)    Small PDA documented by CT angiography, no pulmonary hypertension  . Pericardial calcification    Ruled out for restrictive pericarditis  . Thyroid disease   . Type II or unspecified type diabetes mellitus without mention of complication, not stated as uncontrolled   . Unspecified essential hypertension     Past Surgical History:  Procedure Laterality Date  . ABDOMINAL HYSTERECTOMY     partial  . INCISIONAL HERNIA REPAIR    . INTRAMEDULLARY (IM) NAIL INTERTROCHANTERIC Left 11/13/2012   Procedure: INTRAMEDULLARY (IM) NAIL INTERTROCHANTRIC LEFT HIP;  Surgeon: Velna Ochs, MD;  Location: [facility] OR;  Service: Orthopedics;  Laterality: Left;  . TUBAL LIGATION      Prior to Admission medications   Medication Sig Start Date End Date Taking? Authorizing Provider  acetaminophen (TYLENOL) 325 MG tablet Take 2 tablets (650 mg total) by mouth every 6 (six) hours as needed. 11/18/12  Yes Rai, Ripudeep K, MD  ALPRAZolam (XANAX) 0.5 MG tablet Take 1 tablet (0.5 mg total) by mouth 2 (two) times daily as needed. 02/26/17  Yes Dettinger, Elige Radon, MD  amLODipine (NORVASC) 5 MG tablet Take 1 tablet (5 mg total) daily by mouth. For blood pressure 03/10/17  Yes Dettinger, Elige Radon, MD  aspirin 81 MG tablet Take 81 mg by mouth daily.   Yes [provider]  Calcium Carb-Cholecalciferol (CALCIUM 500 + D3)  500-600 MG-UNIT TABS Take 1 tablet by mouth daily. 06/06/15  Yes Henrene Pastor, PharmD  DULoxetine (CYMBALTA) 30 MG capsule Take 1 capsule (30 mg total) by mouth daily. 03/25/17  Yes Dettinger, Elige Radon, MD  furosemide (LASIX) 40 MG tablet 20 mg. Take one tablet by mouth as needed 01/01/17  Yes [provider]  Insulin Glargine (LANTUS SOLOSTAR) 100 UNIT/ML Solostar Pen Inject 15 Units into the skin daily with breakfast. Patient taking differently: Inject 14 Units into the skin daily with breakfast.  02/04/17  Yes Nida,  Denman George, MD  KLOR-CON M20 20 MEQ tablet TAKE 1 TABLET EVERY DAY 02/03/17  Yes Dettinger, Elige Radon, MD  levothyroxine (SYNTHROID, LEVOTHROID) 50 MCG tablet TAKE 1 TABLET EVERY DAY BEFORE BREAKFAST 02/17/17  Yes Nida, Denman George, MD  lisinopril (PRINIVIL,ZESTRIL) 40 MG tablet Take 1 tablet (40 mg total) by mouth daily. 12/08/16  Yes Laqueta Linden, MD  metoprolol tartrate (LOPRESSOR) 25 MG tablet TAKE 1 TABLET TWICE DAILY 02/03/17  Yes Dettinger, Elige Radon, MD  NITROSTAT 0.4 MG SL tablet DISSOLVE  1 TABLET  UNDER THE TONGUE EVERY 5 MINUTES AS NEEDED FOR CHEST PAIN 01/16/17  Yes Stacks, Broadus John, MD  NOVOLOG FLEXPEN 100 UNIT/ML FlexPen INJECT 3 TO 5 UNITS SUBCUTANEOUSLY THREE TIMES DAILY WITH MEALS 12/16/16  Yes Nida, Denman George, MD  PROLIA 60 MG/ML SOLN injection Inject 60 mg into the skin every 6 (six) months. To be administered 08/20/2016 07/25/16  Yes Stacks, Broadus John, MD  rosuvastatin (CRESTOR) 5 MG tablet Take 1 tablet (5 mg total) daily by mouth. 03/10/17 06/08/17 Yes Dettinger, Elige Radon, MD    Current Facility-Administered Medications  Medication Dose Route Frequency Provider Last Rate Last Dose  . acetaminophen (TYLENOL) tablet 650 mg  650 mg Oral Q6H PRN Leda Gauze, NP      . ALPRAZolam Prudy Feeler) tablet 0.5 mg  0.5 mg Oral BID PRN Filbert Schilder, MD   0.5 mg at 05/05/17 2317  . alum & mag hydroxide-simeth (MAALOX/MYLANTA) 200-200-20 MG/5ML suspension 30 mL  30 mL Oral Q4H PRN Philip Aspen, Limmie Patricia, MD   30 mL at 05/06/17 1301  . amiodarone (NEXTERONE PREMIX) 360-4.14 MG/200ML-% (1.8 mg/mL) IV infusion  60 mg/hr Intravenous Continuous Antoine Poche, MD 33.3 mL/hr at 05/07/17 1255 60 mg/hr at 05/07/17 1255   Followed by  . amiodarone (NEXTERONE PREMIX) 360-4.14 MG/200ML-% (1.8 mg/mL) IV infusion  30 mg/hr Intravenous Continuous Antoine Poche, MD      . DULoxetine (CYMBALTA) DR capsule 30 mg  30 mg Oral Daily Filbert Schilder, MD   30 mg at 05/06/17  0900  . enoxaparin (LOVENOX) injection 55 mg  55 mg Subcutaneous Q12H Johnson, Clanford L, MD   55 mg at 05/07/17 1013  . furosemide (LASIX) injection 40 mg  40 mg Intravenous BID Antoine Poche, MD   40 mg at 05/07/17 1247  . HYDROcodone-acetaminophen (NORCO/VICODIN) 5-325 MG per tablet 1 tablet  1 tablet Oral Q6H PRN Kirby-Graham, Beather Arbour, NP      . insulin aspart (novoLOG) injection 0-9 Units  0-9 Units Subcutaneous TID WC Filbert Schilder, MD   2 Units at 05/07/17 1203  . insulin glargine (LANTUS) injection 15 Units  15 Units Subcutaneous Q breakfast Filbert Schilder, MD   15 Units at 05/07/17 0800  . levothyroxine (SYNTHROID, LEVOTHROID) tablet 50 mcg  50 mcg Oral QAC breakfast Filbert Schilder, MD   50 mcg at 05/07/17 0747  . ondansetron (ZOFRAN)  injection 4 mg  4 mg Intravenous Q6H PRN Philip Aspen, Limmie Patricia, MD   4 mg at 05/07/17 1610  . ondansetron (ZOFRAN) injection 4 mg  4 mg Intravenous TID WC & HS Fields, Sandi L, MD   4 mg at 05/07/17 1122  . pantoprazole (PROTONIX) injection 40 mg  40 mg Intravenous BID AC Fields, Sandi L, MD      . rosuvastatin (CRESTOR) tablet 5 mg  5 mg Oral Daily Filbert Schilder, MD   5 mg at 05/06/17 0900  . sodium chloride (OCEAN) 0.65 % nasal spray 1 spray  1 spray Each Nare PRN Philip Aspen, Limmie Patricia, MD      . warfarin (COUMADIN) tablet 2.5 mg  2.5 mg Oral Once Cleora Fleet, MD      . Warfarin - Pharmacist Dosing Inpatient   Does not apply [id number] Filbert Schilder, MD        Allergies as of 05/05/2017 - Review Complete 05/05/2017  Allergen Reaction Noted  . Codeine    . Invokana [canagliflozin]  11/30/2015  . Naproxen Other (See Comments) 01/21/2017  . Azithromycin Other (See Comments)     Family History  Problem Relation Age of Onset  . Stroke Mother   . Hypertension Mother   . Diabetes Brother   . Early death Brother   . Heart disease Daughter   . Stroke Daughter   . Congenital heart disease Daughter    . Diabetes Son 29  . Stroke Brother   . Diabetes Brother 6       type 1    Social History   Socioeconomic History  . Marital status: Widowed    Spouse name: Not on file  . Number of children: 2  . Years of education: Not on file  . Highest education level: Not on file  Social Needs  . Financial resource strain: Not on file  . Food insecurity - worry: Not on file  . Food insecurity - inability: Not on file  . Transportation needs - medical: Not on file  . Transportation needs - non-medical: Not on file  Occupational History  . Not on file  Tobacco Use  . Smoking status: Never Smoker  . Smokeless tobacco: Never Used  Substance and Sexual Activity  . Alcohol use: No  . Drug use: No  . Sexual activity: No  Other Topics Concern  . Not on file  Social History Narrative  . Not on file    Review of Systems: General: Negative for anorexia, weight loss, fever, chills. ENT: Negative for hoarseness, difficulty swallowing. CV: Negative for chest pain, angina, palpitations, peripheral edema.  Respiratory: Negative for dyspnea at rest, cough, sputum, wheezing.  GI: See history of present illness. MS: Negative for joint pain, low back pain.  Derm: Negative for rash or itching.  Endo: Negative for unusual weight change.  Heme: Negative for bruising or bleeding. Allergy: Negative for rash or hives.  Physical Exam: Vital signs in last 24 hours: Temp:  [97.8 F (36.6 C)-98.6 F (37 C)] 97.8 F (36.6 C) (01/03 0800) Pulse Rate:  [70-155] 103 (01/03 1230) Resp:  [12-37] 13 (01/03 1230) BP: (69-125)/(43-98) 101/57 (01/03 1230) SpO2:  [84 %-96 %] 95 % (01/03 1230) Weight:  [124 lb 1.9 oz (56.3 kg)] 124 lb 1.9 oz (56.3 kg) (01/03 0500) Last BM Date: 05/04/17 General:   Alert,  Well-developed, well-nourished, pleasant and cooperative in NAD Head:  Normocephalic and atraumatic. Eyes:  Sclera clear, no icterus. Conjunctiva  pink. Ears:  Normal auditory acuity. Neck:  Supple; no  masses or thyromegaly. Lungs:  Clear throughout to auscultation.  No wheezes, crackles, or rhonchi. No acute distress. Heart:  Tachycardic; no murmurs, clicks, rubs, or gallops. AFib RVR HR 128 on the monitor. Abdomen:  Soft, and nondistended. Mild to moderate TTP lower abdomen and LUQ/epigastrum. No masses, hepatosplenomegaly or hernias noted. Normal bowel sounds, without guarding, and without rebound.   Rectal:  Deferred.   Msk:  Symmetrical without gross deformities. Pulses:  Normal bilateral DP pulses noted. Extremities:  Without clubbing or edema. Neurologic:  Alert and  oriented x4;  grossly normal neurologically. Psych:  Alert and cooperative. Normal mood and affect.  Intake/Output from previous day: 01/02 0701 - 01/03 0700 In: 367.2 [P.O.:100; I.V.:267.2] Out: 600 [Urine:600] Intake/Output this shift: No intake/output data recorded.  Lab Results: Recent Labs    05/05/17 1322 05/06/17 0325  WBC 9.0 6.4  HGB 12.0 11.2*  HCT 38.9 34.9*  PLT 226 190   BMET Recent Labs    05/05/17 1322 05/06/17 0325  NA 138 142  K 4.8 4.1  CL 105 108  CO2 20* 24  GLUCOSE 314* 127*  BUN 42* 43*  CREATININE 1.16* 1.19*  CALCIUM 8.9 8.7*   LFT No results for input(s): PROT, ALBUMIN, AST, ALT, ALKPHOS, BILITOT, BILIDIR, IBILI in the last 72 hours. PT/INR Recent Labs    05/05/17 2251 05/07/17 0643  LABPROT 13.8 19.2*  INR 1.07 1.64   Hepatitis Panel No results for input(s): HEPBSAG, HCVAB, HEPAIGM, HEPBIGM in the last 72 hours. C-Diff No results for input(s): CDIFFTOX in the last 72 hours.  Studies/Results: Dg Chest Port 1 View  Result Date: 05/05/2017 CLINICAL DATA:  Chest pain EXAM: PORTABLE CHEST 1 VIEW COMPARISON:  10/12/2016 FINDINGS: Cardiac shadow remains enlarged. Aortic calcifications are noted. Vascular congestion with interstitial edema and bilateral pleural effusions right greater than left is noted. No bony abnormality is seen. IMPRESSION: Changes of CHF.  Electronically Signed   By: Alcide Clever M.D.   On: 05/05/2017 13:54    Impression: 77 year old female with a history of diabetes admitted for new A. fib and rapid ventricular response currently in the ICU on a Cardizem "wean off of Cardizem to p.o. medications.  Per nursing staff the patient began having nausea and vomiting yesterday afternoon and since then has had a difficult time keeping down food and fluids.  Initially an order for around-the-clock Zofran and as needed Zofran with as needed Phenergan was considered.  However, the pharmacy called with concerns about pre-existing QTc interval prolongation (over 500) and on Amiodarone already; possible worsening QT interval prolongation/risk for Torsades on scheduled Zofran.  She does also have a history of GERD.  CBC yesterday with very mild anemia at a at 12.0, likely some hydration effect.  Kidney function stable with a creatinine of 1.19.  Electrolytes otherwise essentially normal.  No history of gastric emptying study.  Hospitalist service started her on IV Protonix daily.  She continues to have GERD symptoms today.  She also has significant constipation.  Both of these could be contributing to her relatively new onset nausea and vomiting.  Also the potential for diabetic gastroparesis.  She is not stable enough to take off the floor at this time for gastric emptying study.  She is also not stable enough to be an endoscopic candidate at this time.  A dysphasia 2 soft diet is already been started for her.   Plan: 1. Discontinue scheduled Zofran due  to concerns about QT prolongation 2. Per Lawson FiscalLori in Pharmacy, best option is Zofran 4 mg q 8 hrs prn 3. Continue as needed Phenergan 4. If needed we can consider adding low-dose Reglan as well: 5 mg qac/tid 5. Start Linzess 145 mcg for persistent constipation.  Can increase to 290 mcg daily if 145 is ineffective today 6. Continue Protonix to twice daily 7. Supportive measures 8. May eventually need  upper endoscopy when she is more stable.   Thank you for allowing us to participate in the care of Ellen Woods  Wynne DustEric Alysa Duca, DNP, AGNP-C Adult & Gerontological Nurse Practitioner [facility]    LOS: 2 days     05/07/2017, 1:38 PM

## 2017-05-07 NOTE — Progress Notes (Addendum)
HR maintaining 145. Cardizem on 15mg /hr. MD paged 0.25mg  of Digoxin ordered and given Pt vomited right after administration of Cardizem PO and eating a cracker. Phenergan nor Zofran seem to be resolving N/V.  Genelle Balameron D Brentley Horrell, RN

## 2017-05-07 NOTE — Progress Notes (Signed)
Pt SBP 70-90's. Asymptomatic while lying down. Cardizem gtt titrated down to 10 to see if that would increase pressure. No success. HR 100-120 MD paged.  Verbal order received to give bolus over 1 hour if MAP <60 consistently.

## 2017-05-07 NOTE — Progress Notes (Signed)
PROGRESS NOTE    Ellen Woods  Woods:[id number] DOB: Sep 14, 1940 DOA: 05/05/2017 PCP: Dettinger, Elige Radon, MD   Brief Narrative:  77 year old woman admitted from home on 1/1 due to palpitations.  Found to be in new onset A. fib with RVR.  Admission was requested.  Assessment & Plan:   Principal Problem:   Atrial fibrillation with RVR (HCC) Active Problems:   Mixed hyperlipidemia   Essential hypertension, benign   Chronic diastolic heart failure (HCC)   Diabetes type 2, uncontrolled (HCC)   Hypothyroidism   Uncontrolled type 2 diabetes mellitus with hyperglycemia (HCC)   Atrial fibrillation, new onset (HCC)  Atrial fibrillation with rapid ventricular response -Started back on IV Cardizem drip at 15 mg an hour due to uncontrolled HR, Will ask for cardiology consult for assistance with rate control.   -Her CHADS VASC score is at least 5 and as such has been started on Coumadin with a Lovenox bridge (the family was not amenable to a new anticoagulant at least on admission, bears revisiting).  CHF, unspecified type -Suspect this is diastolic,  Echo with normal LV size and function EF 60-65%.  Unable to assess diastolic function due to rapid Afib.  -Pt appears compensated.  Will follow.    Hypothyroidism -Continue Synthroid  Hyperlipidemia -Continue Crestor  Depression and anxiety -Continue Cymbalta and Xanax  Hypertension -Blood pressure has been soft while on Cardizem drip  DVT prophylaxis: Lovenox Code Status: Full code Family Communication:  Disposition Plan: Not medically ready  Consultants:   Cardiology  Procedures:   2D echo  Antimicrobials:  Anti-infectives (From admission, onward)   None       Subjective: Pt went back into rapid Afib early this morning, now back on cardizem drip.   Objective: Vitals:   05/07/17 0515 05/07/17 0530 05/07/17 0600 05/07/17 0615  BP: 108/63 109/64 117/64 116/61  Pulse: (!) 143 (!) 144 (!) 146 (!) 143  Resp: (!) 21 16  20 17   Temp:      TempSrc:      SpO2: 94% 95% 92% 96%  Weight:      Height:        Intake/Output Summary (Last 24 hours) at 05/07/2017 0645 Last data filed at 05/07/2017 0600 Gross per 24 hour  Intake 367.21 ml  Output 600 ml  Net -232.79 ml   Filed Weights   05/05/17 2126 05/06/17 0435 05/07/17 0500  Weight: 55.8 kg (123 lb 0.3 oz) 55.9 kg (123 lb 3.8 oz) 56.3 kg (124 lb 1.9 oz)    Examination:  General exam: Alert, awake, oriented x 3 Respiratory system: Mild bibasilar crackles Cardiovascular system: Tachycardic, irregular Gastrointestinal system: Abdomen is nondistended, soft and nontender. No organomegaly or masses felt. Normal bowel sounds heard. Central nervous system: Alert and oriented. No focal neurological deficits. Extremities: Trace bilateral pitting edema, positive pedal pulses Skin: No rashes, lesions or ulcers Psychiatry: Judgement and insight appear normal. Mood & affect appropriate.   Data Reviewed: I have personally reviewed following labs and imaging studies  CBC: Recent Labs  Lab 05/05/17 1322 05/06/17 0325  WBC 9.0 6.4  NEUTROABS 7.6  --   HGB 12.0 11.2*  HCT 38.9 34.9*  MCV 101.0* 99.4  PLT 226 190   Basic Metabolic Panel: Recent Labs  Lab 05/05/17 1322 05/06/17 0325  NA 138 142  K 4.8 4.1  CL 105 108  CO2 20* 24  GLUCOSE 314* 127*  BUN 42* 43*  CREATININE 1.16* 1.19*  CALCIUM 8.9 8.7*  GFR: Estimated Creatinine Clearance: 30.7 mL/min (A) (by C-G formula based on SCr of 1.19 mg/dL (H)). Liver Function Tests: No results for input(s): AST, ALT, ALKPHOS, BILITOT, PROT, ALBUMIN in the last 168 hours. No results for input(s): LIPASE, AMYLASE in the last 168 hours. No results for input(s): AMMONIA in the last 168 hours. Coagulation Profile: Recent Labs  Lab 05/05/17 2251  INR 1.07   Cardiac Enzymes: Recent Labs  Lab 05/05/17 1322 05/05/17 2141 05/06/17 0325 05/06/17 1039  TROPONINI <0.03 <0.03 <0.03 <0.03   BNP (last 3  results) No results for input(s): PROBNP in the last 8760 hours. HbA1C: Recent Labs    05/05/17 1322  HGBA1C 7.2*   CBG: Recent Labs  Lab 05/05/17 2211 05/06/17 0805 05/06/17 1112 05/06/17 1505 05/06/17 2128  GLUCAP 206* 112* 134* 107* 173*   Lipid Profile: No results for input(s): CHOL, HDL, LDLCALC, TRIG, CHOLHDL, LDLDIRECT in the last 72 hours. Thyroid Function Tests: No results for input(s): TSH, T4TOTAL, FREET4, T3FREE, THYROIDAB in the last 72 hours. Anemia Panel: No results for input(s): VITAMINB12, FOLATE, FERRITIN, TIBC, IRON, RETICCTPCT in the last 72 hours. Urine analysis:    Component Value Date/Time   COLORURINE YELLOW 03/14/2014 1227   APPEARANCEUR CLEAR 03/14/2014 1227   LABSPEC 1.015 03/14/2014 1227   PHURINE 5.0 03/14/2014 1227   GLUCOSEU 100 (A) 03/14/2014 1227   HGBUR NEGATIVE 03/14/2014 1227   BILIRUBINUR neg 06/13/2015 1710   KETONESUR NEGATIVE 03/14/2014 1227   PROTEINUR neg 06/13/2015 1710   PROTEINUR NEGATIVE 03/14/2014 1227   UROBILINOGEN negative 06/13/2015 1710   UROBILINOGEN 0.2 03/14/2014 1227   NITRITE neg 06/13/2015 1710   NITRITE NEGATIVE 03/14/2014 1227   LEUKOCYTESUR Negative 06/13/2015 1710    Recent Results (from the past 240 hour(s))  MRSA PCR Screening     Status: None   Collection Time: 05/05/17  9:20 PM  Result Value Ref Range Status   MRSA by PCR NEGATIVE NEGATIVE Final    Comment:        The GeneXpert MRSA Assay (FDA approved for NASAL specimens only), is one component of a comprehensive MRSA colonization surveillance program. It is not intended to diagnose MRSA infection nor to guide or monitor treatment for MRSA infections.     Radiology Studies: Dg Chest Port 1 View  Result Date: 05/05/2017 CLINICAL DATA:  Chest pain EXAM: PORTABLE CHEST 1 VIEW COMPARISON:  10/12/2016 FINDINGS: Cardiac shadow remains enlarged. Aortic calcifications are noted. Vascular congestion with interstitial edema and bilateral pleural  effusions right greater than left is noted. No bony abnormality is seen. IMPRESSION: Changes of CHF. Electronically Signed   By: Alcide CleverMark  Lukens M.D.   On: 05/05/2017 13:54   Scheduled Meds: . coumadin book   Does not apply Once  . diltiazem  30 mg Oral Q8H  . DULoxetine  30 mg Oral Daily  . enoxaparin (LOVENOX) injection  60 mg Subcutaneous Q12H  . insulin aspart  0-9 Units Subcutaneous TID WC  . insulin glargine  15 Units Subcutaneous Q breakfast  . levothyroxine  50 mcg Oral QAC breakfast  . off the beat book   Does not apply Once  . rosuvastatin  5 mg Oral Daily  . Warfarin - Pharmacist Dosing Inpatient   Does not apply q1800   Continuous Infusions: . diltiazem (CARDIZEM) infusion 15 mg/hr (05/07/17 0415)     LOS: 2 days   Critical Care Time spent: 40 minutes. Greater than 50% of this time was spent in direct contact with the  patient coordinating care.  Standley Dakins, MD [facility] Pager [phone]  If 7PM-7AM, please contact night-coverage [email or web address] Password [facility] 05/07/2017, 6:45 AM

## 2017-05-08 ENCOUNTER — Encounter (HOSPITAL_COMMUNITY): Payer: Self-pay

## 2017-05-08 DIAGNOSIS — E038 Other specified hypothyroidism: Secondary | ICD-10-CM

## 2017-05-08 DIAGNOSIS — K2289 Other specified disease of esophagus: Secondary | ICD-10-CM

## 2017-05-08 DIAGNOSIS — K228 Other specified diseases of esophagus: Secondary | ICD-10-CM

## 2017-05-08 DIAGNOSIS — R1310 Dysphagia, unspecified: Secondary | ICD-10-CM

## 2017-05-08 LAB — COMPREHENSIVE METABOLIC PANEL
ALBUMIN: 3.1 g/dL — AB (ref 3.5–5.0)
ALT: 30 U/L (ref 14–54)
AST: 20 U/L (ref 15–41)
Alkaline Phosphatase: 69 U/L (ref 38–126)
Anion gap: 13 (ref 5–15)
BUN: 35 mg/dL — AB (ref 6–20)
CHLORIDE: 104 mmol/L (ref 101–111)
CO2: 24 mmol/L (ref 22–32)
CREATININE: 1.42 mg/dL — AB (ref 0.44–1.00)
Calcium: 8 mg/dL — ABNORMAL LOW (ref 8.9–10.3)
GFR calc Af Amer: 40 mL/min — ABNORMAL LOW (ref >60)
GFR, EST NON AFRICAN AMERICAN: 35 mL/min — AB (ref >60)
GLUCOSE: 81 mg/dL (ref 65–99)
POTASSIUM: 3.5 mmol/L (ref 3.5–5.1)
Sodium: 141 mmol/L (ref 135–145)
Total Bilirubin: 0.8 mg/dL (ref 0.3–1.2)
Total Protein: 5.8 g/dL — ABNORMAL LOW (ref 6.5–8.1)

## 2017-05-08 LAB — CBC WITH DIFFERENTIAL/PLATELET
Basophils Absolute: 0 10*3/uL (ref 0.0–0.1)
Basophils Relative: 0 %
EOS ABS: 0 10*3/uL (ref 0.0–0.7)
EOS PCT: 0 %
HCT: 34.5 % — ABNORMAL LOW (ref 36.0–46.0)
Hemoglobin: 10.8 g/dL — ABNORMAL LOW (ref 12.0–15.0)
LYMPHS ABS: 0.7 10*3/uL (ref 0.7–4.0)
LYMPHS PCT: 10 %
MCH: 31.5 pg (ref 26.0–34.0)
MCHC: 31.3 g/dL (ref 30.0–36.0)
MCV: 100.6 fL — AB (ref 78.0–100.0)
MONO ABS: 0.6 10*3/uL (ref 0.1–1.0)
MONOS PCT: 8 %
Neutro Abs: 5.8 10*3/uL (ref 1.7–7.7)
Neutrophils Relative %: 82 %
PLATELETS: 218 10*3/uL (ref 150–400)
RBC: 3.43 MIL/uL — AB (ref 3.87–5.11)
RDW: 13.6 % (ref 11.5–15.5)
WBC: 7.2 10*3/uL (ref 4.0–10.5)

## 2017-05-08 LAB — GLUCOSE, CAPILLARY
GLUCOSE-CAPILLARY: 95 mg/dL (ref 65–99)
GLUCOSE-CAPILLARY: 74 mg/dL (ref 65–99)
GLUCOSE-CAPILLARY: 44 mg/dL — AB (ref 65–99)
Glucose-Capillary: 69 mg/dL (ref 65–99)
Glucose-Capillary: 99 mg/dL (ref 65–99)
Glucose-Capillary: 110 mg/dL — ABNORMAL HIGH (ref 65–99)

## 2017-05-08 LAB — PROTIME-INR
INR: 2.67
PROTHROMBIN TIME: 28.2 s — AB (ref 11.4–15.2)

## 2017-05-08 LAB — MAGNESIUM: MAGNESIUM: 2 mg/dL (ref 1.7–2.4)

## 2017-05-08 LAB — TSH: TSH: 1.159 u[IU]/mL (ref 0.350–4.500)

## 2017-05-08 MED ORDER — METOCLOPRAMIDE HCL 5 MG/ML IJ SOLN
10.0000 mg | Freq: Three times a day (TID) | INTRAMUSCULAR | Status: DC
  Administered 2017-05-08 – 2017-05-15 (×21): 10 mg via INTRAVENOUS
  Filled 2017-05-08 (×21): qty 2

## 2017-05-08 MED ORDER — AMIODARONE LOAD VIA INFUSION
150.0000 mg | Freq: Once | INTRAVENOUS | Status: AC
  Administered 2017-05-08: 150 mg via INTRAVENOUS
  Filled 2017-05-08: qty 83.34

## 2017-05-08 MED ORDER — METOPROLOL TARTRATE 25 MG PO TABS
12.5000 mg | ORAL_TABLET | Freq: Two times a day (BID) | ORAL | Status: DC
  Administered 2017-05-08 – 2017-05-10 (×5): 12.5 mg via ORAL
  Filled 2017-05-08 (×5): qty 1

## 2017-05-08 MED ORDER — GLUCERNA SHAKE PO LIQD
237.0000 mL | Freq: Three times a day (TID) | ORAL | Status: DC
  Administered 2017-05-08 – 2017-05-19 (×19): 237 mL via ORAL

## 2017-05-08 MED ORDER — INSULIN GLARGINE 100 UNIT/ML ~~LOC~~ SOLN
12.0000 [IU] | Freq: Every day | SUBCUTANEOUS | Status: DC
  Filled 2017-05-08 (×3): qty 0.12

## 2017-05-08 MED ORDER — DEXTROSE 50 % IV SOLN
INTRAVENOUS | Status: AC
Start: 2017-05-08 — End: 2017-05-08
  Administered 2017-05-08: 50 mL
  Filled 2017-05-08: qty 50

## 2017-05-08 MED ORDER — PROMETHAZINE HCL 25 MG/ML IJ SOLN
12.5000 mg | Freq: Four times a day (QID) | INTRAMUSCULAR | Status: DC | PRN
  Administered 2017-05-08 – 2017-05-20 (×9): 12.5 mg via INTRAVENOUS
  Filled 2017-05-08 (×9): qty 1

## 2017-05-08 NOTE — Progress Notes (Addendum)
Progress Note  Patient Name: Ellen Woods Date of Encounter: 05/08/2017  Primary Cardiologist: n/a  Subjective   SOB improved. No palpitations.   Inpatient Medications    Scheduled Meds: . DULoxetine  30 mg Oral Daily  . insulin aspart  0-9 Units Subcutaneous TID WC  . [START ON 05/09/2017] insulin glargine  12 Units Subcutaneous Q breakfast  . levothyroxine  50 mcg Oral QAC breakfast  . linaclotide  145 mcg Oral QAC breakfast  . metoCLOPramide (REGLAN) injection  5 mg Intravenous TID AC  . pantoprazole (PROTONIX) IV  40 mg Intravenous BID AC  . rosuvastatin  5 mg Oral Daily  . Warfarin - Pharmacist Dosing Inpatient   Does not apply q1800   Continuous Infusions: . amiodarone 30 mg/hr (05/08/17 0720)   PRN Meds: acetaminophen, ALPRAZolam, alum & mag hydroxide-simeth, HYDROcodone-acetaminophen, ondansetron (ZOFRAN) IV, sodium chloride   Vital Signs    Vitals:   05/08/17 0858 05/08/17 0900 05/08/17 0915 05/08/17 0930  BP:  106/66 112/75 122/78  Pulse:  (!) 163 (!) 155 (!) 159  Resp:  20 (!) 22 (!) 21  Temp: 98.5 F (36.9 C)     TempSrc:      SpO2:  95% 97% 96%  Weight:      Height:        Intake/Output Summary (Last 24 hours) at 05/08/2017 1008 Last data filed at 05/08/2017 0600 Gross per 24 hour  Intake 471.3 ml  Output 1050 ml  Net -578.7 ml   Filed Weights   05/06/17 0435 05/07/17 0500 05/08/17 0500  Weight: 123 lb 3.8 oz (55.9 kg) 124 lb 1.9 oz (56.3 kg) 123 lb 7.3 oz (56 kg)    Telemetry    afib with RVR - Personally Reviewed  ECG    n/a  Physical Exam   GEN: No acute distress.   Neck: No JVD Cardiac: irreg, no m/r/g, no jvd Respiratory: Clear to auscultation bilaterally. GI: Soft, nontender, non-distended  MS: No edema; No deformity. Neuro:  Nonfocal  Psych: Normal affect   Labs    Chemistry Recent Labs  Lab 05/05/17 1322 05/06/17 0325 05/08/17 0421  NA 138 142 141  K 4.8 4.1 3.5  CL 105 108 104  CO2 20* 24 24  GLUCOSE 314*  127* 81  BUN 42* 43* 35*  CREATININE 1.16* 1.19* 1.42*  CALCIUM 8.9 8.7* 8.0*  PROT  --   --  5.8*  ALBUMIN  --   --  3.1*  AST  --   --  20  ALT  --   --  30  ALKPHOS  --   --  69  BILITOT  --   --  0.8  GFRNONAA 45* 43* 35*  GFRAA 52* 50* 40*  ANIONGAP 13 10 13      Hematology Recent Labs  Lab 05/05/17 1322 05/06/17 0325 05/08/17 0421  WBC 9.0 6.4 7.2  RBC 3.85* 3.51* 3.43*  HGB 12.0 11.2* 10.8*  HCT 38.9 34.9* 34.5*  MCV 101.0* 99.4 100.6*  MCH 31.2 31.9 31.5  MCHC 30.8 32.1 31.3  RDW 13.7 13.5 13.6  PLT 226 190 218    Cardiac Enzymes Recent Labs  Lab 05/05/17 1322 05/05/17 2141 05/06/17 0325 05/06/17 1039  TROPONINI <0.03 <0.03 <0.03 <0.03   No results for input(s): TROPIPOC in the last 168 hours.   BNPNo results for input(s): BNP, PROBNP in the last 168 hours.   DDimer No results for input(s): DDIMER in the last 168 hours.  Radiology    No results found.  Cardiac Studies    Patient Profile     Ellen Woods is a 77 y.o. female with past medical history of CAD (nonobstructive CAD by cath in 2012 with low-risk NST in 12/2016), HTN, HLD, Type 2 DM, and COPD who is being seen today for the evaluation of atrial fibrillation with RVR at the request of Dr. Laural Benes.     Assessment & Plan    1. New onset afib - not controlled with IV dilt drip, rate control limited by soft bp's  - antiarrhtyhmic options limited in setting of CAD - started on IV amio yesterday around 1pm - started on coumadin for stroke prevention per patient request, CHADS2Vasc score is 5  - remains in afib with elevated rates, will rebolus amio and continue drip  2. CAD  nonobstructive CAD by cath in 2012 with low-risk NST in 12/2016.  - she denies any recent chest pain or dyspnea on exertion.  - no further ischemic evaluation indicated at this time.   3. Acute on chronic diastolic HF - Jan 2019 echo LVEF 60-65%, cannot eval diastolic function due to afib but biatrial  enlargement and elevated PASP suggests - negative yesterday, negative 1.7 liters since admission. She received lasix 40mg  IV bid yesterday. Uptrend in Cr, no further diuresis today.   4. N/V - per primary team   I would continue amio 30mg /hr until conversion, when converts change to amio 400mg  bid oral x 1 week then 200mg  bid x 2 weeks, then 200mg  daily. If does not convert would plan for TEE/Cardioversion on Monday, would need to be npo Sunday night.   I am on call at [facility] over the weekend and available at [phone] for questions.   For questions or updates, please contact [facility] Please consult [email or web address] for contact info under Cardiology/STEMI.      Joanie Coddington, MD  05/08/2017, 10:08 AM

## 2017-05-08 NOTE — Progress Notes (Signed)
Blood sugar 44 ...treated will recheck per protocol

## 2017-05-08 NOTE — Progress Notes (Addendum)
PROGRESS NOTE    Ellen Woods  ZOX:[id number] DOB: 14-Jan-1941 DOA: 05/05/2017 PCP: Dettinger, Elige Radon, MD   Brief Narrative:  77 year old woman admitted from home on 1/1 due to palpitations.  Found to be in new onset A. fib with RVR.  Admission was requested.  Assessment & Plan:   Principal Problem:   Atrial fibrillation with RVR (HCC) Active Problems:   Mixed hyperlipidemia   Essential hypertension, benign   Chronic diastolic heart failure (HCC)   Diabetes type 2, uncontrolled (HCC)   Hypothyroidism   Uncontrolled type 2 diabetes mellitus with hyperglycemia (HCC)   Atrial fibrillation, new onset (HCC)   Congestive heart failure (HCC)   Intractable cyclical vomiting with nausea  Atrial fibrillation with rapid ventricular response -Initially Started back on IV Cardizem drip but not able to control rate, cardiology consulted for assistance with rate control and started on IV amiodarone, HR still uncontrolled this morning.  Pt not symptomatic. HR in 130s-140 consistently.   -Her CHADS VASC score is at least 5 and as such has been started on Coumadin with a Lovenox bridge.  Now off lovenox, warfarin therapeutic. Following with pharmacist.    Intractable nausea and vomiting - Pt reports that she is feeling better today and wants to try diet.    CHF, unspecified type -Suspect this is diastolic,  Echo with normal LV size and function EF 60-65%.  Unable to assess diastolic function due to rapid Afib.  -Pt diuresed 1.4L since admission, feeling better today.      Diabetes Mellitus type 2 insulin requiring CBG (last 3)  Recent Labs    05/07/17 1700 05/07/17 2132 05/08/17 0749  GLUCAP 169* 118* 69   Reducing lantus dose to 12 units Continue sliding scale coverage Carb modified diet  Hypothyroidism -Continue Synthroid, TSH was WNL 2 months ago  Hyperlipidemia -Continue Crestor  Depression and anxiety -Continue Cymbalta and Xanax  Hypertension -Blood pressure well  controlled  DVT prophylaxis: Lovenox Code Status: Full code Family Communication:  Disposition Plan: Not medically ready  Consultants:   Cardiology  Procedures:   2D echo Study Conclusions  - Left ventricle: The cavity size was normal. Wall thickness was  normal. Systolic function was normal. The estimated ejection   fraction was in the range of 60% to 65%. Wall motion was normal;  there were no regional wall motion abnormalities. - Aortic valve: Mildly calcified annulus. Trileaflet; normal thickness leaflets. Valve area (VTI): 1.87 cm^2. - Mitral valve: There was mild regurgitation. - Left atrium: The atrium was moderately dilated. - Right atrium: The atrium was mildly dilated. - Atrial septum: No defect or patent foramen ovale was identified. - Pulmonary arteries: Systolic pressure was mildly to moderately increased. PA peak pressure: 40 mm Hg (S).  Antimicrobials:  Anti-infectives (From admission, onward)   None     Subjective: Pt denies complaints, says she is wanting to try to eat this morning.    Objective: Vitals:   05/08/17 0745 05/08/17 0800 05/08/17 0815 05/08/17 0830  BP: 110/68 127/62 121/68 121/67  Pulse: (!) 138 (!) 139 (!) 141 (!) 143  Resp: 14  20 20   Temp:      TempSrc:      SpO2: 96% 94% 94% 95%  Weight:      Height:        Intake/Output Summary (Last 24 hours) at 05/08/2017 0851 Last data filed at 05/08/2017 0600 Gross per 24 hour  Intake 471.3 ml  Output 1250 ml  Net -778.7 ml  Filed Weights   05/06/17 0435 05/07/17 0500 05/08/17 0500  Weight: 55.9 kg (123 lb 3.8 oz) 56.3 kg (124 lb 1.9 oz) 56 kg (123 lb 7.3 oz)    Examination:  General exam: Alert, awake, oriented x 3 Respiratory system: improved, fairly clear today Cardiovascular system: Tachycardic, irregular Gastrointestinal system: Abdomen is nondistended, soft and nontender. No organomegaly or masses felt. Normal bowel sounds heard. Central nervous system: Alert and oriented. No  focal neurological deficits. Extremities: Trace bilateral pitting edema, positive pedal pulses Skin: No rashes, lesions or ulcers Psychiatry: Judgement and insight appear normal. Mood & affect appropriate.   Data Reviewed: I have personally reviewed following labs and imaging studies  CBC: Recent Labs  Lab 05/05/17 1322 05/06/17 0325 05/08/17 0421  WBC 9.0 6.4 7.2  NEUTROABS 7.6  --  5.8  HGB 12.0 11.2* 10.8*  HCT 38.9 34.9* 34.5*  MCV 101.0* 99.4 100.6*  PLT 226 190 218   Basic Metabolic Panel: Recent Labs  Lab 05/05/17 1322 05/06/17 0325  NA 138 142  K 4.8 4.1  CL 105 108  CO2 20* 24  GLUCOSE 314* 127*  BUN 42* 43*  CREATININE 1.16* 1.19*  CALCIUM 8.9 8.7*   GFR: Estimated Creatinine Clearance: 30.7 mL/min (A) (by C-G formula based on SCr of 1.19 mg/dL (H)). Liver Function Tests: No results for input(s): AST, ALT, ALKPHOS, BILITOT, PROT, ALBUMIN in the last 168 hours. No results for input(s): LIPASE, AMYLASE in the last 168 hours. No results for input(s): AMMONIA in the last 168 hours. Coagulation Profile: Recent Labs  Lab 05/05/17 2251 05/07/17 0643 05/08/17 0421  INR 1.07 1.64 2.67   Cardiac Enzymes: Recent Labs  Lab 05/05/17 1322 05/05/17 2141 05/06/17 0325 05/06/17 1039  TROPONINI <0.03 <0.03 <0.03 <0.03   BNP (last 3 results) No results for input(s): PROBNP in the last 8760 hours. HbA1C: Recent Labs    05/05/17 1322  HGBA1C 7.2*   CBG: Recent Labs  Lab 05/07/17 0751 05/07/17 1200 05/07/17 1700 05/07/17 2132 05/08/17 0749  GLUCAP 200* 167* 169* 118* 69   Lipid Profile: No results for input(s): CHOL, HDL, LDLCALC, TRIG, CHOLHDL, LDLDIRECT in the last 72 hours. Thyroid Function Tests: No results for input(s): TSH, T4TOTAL, FREET4, T3FREE, THYROIDAB in the last 72 hours. Anemia Panel: No results for input(s): VITAMINB12, FOLATE, FERRITIN, TIBC, IRON, RETICCTPCT in the last 72 hours. Urine analysis:    Component Value Date/Time    COLORURINE YELLOW 03/14/2014 1227   APPEARANCEUR CLEAR 03/14/2014 1227   LABSPEC 1.015 03/14/2014 1227   PHURINE 5.0 03/14/2014 1227   GLUCOSEU 100 (A) 03/14/2014 1227   HGBUR NEGATIVE 03/14/2014 1227   BILIRUBINUR neg 06/13/2015 1710   KETONESUR NEGATIVE 03/14/2014 1227   PROTEINUR neg 06/13/2015 1710   PROTEINUR NEGATIVE 03/14/2014 1227   UROBILINOGEN negative 06/13/2015 1710   UROBILINOGEN 0.2 03/14/2014 1227   NITRITE neg 06/13/2015 1710   NITRITE NEGATIVE 03/14/2014 1227   LEUKOCYTESUR Negative 06/13/2015 1710    Recent Results (from the past 240 hour(s))  MRSA PCR Screening     Status: None   Collection Time: 05/05/17  9:20 PM  Result Value Ref Range Status   MRSA by PCR NEGATIVE NEGATIVE Final    Comment:        The GeneXpert MRSA Assay (FDA approved for NASAL specimens only), is one component of a comprehensive MRSA colonization surveillance program. It is not intended to diagnose MRSA infection nor to guide or monitor treatment for MRSA infections.  Radiology Studies: No results found. Scheduled Meds: . DULoxetine  30 mg Oral Daily  . insulin aspart  0-9 Units Subcutaneous TID WC  . [START ON 05/09/2017] insulin glargine  12 Units Subcutaneous Q breakfast  . levothyroxine  50 mcg Oral QAC breakfast  . linaclotide  145 mcg Oral QAC breakfast  . metoCLOPramide (REGLAN) injection  5 mg Intravenous TID AC  . pantoprazole (PROTONIX) IV  40 mg Intravenous BID AC  . rosuvastatin  5 mg Oral Daily  . Warfarin - Pharmacist Dosing Inpatient   Does not apply q1800   Continuous Infusions: . amiodarone 30 mg/hr (05/08/17 0720)     LOS: 3 days   Critical Care Time spent: 34 minutes. Greater than 50% of this time was spent in direct contact with the patient coordinating care.  Standley Dakinslanford Rohnan Bartleson, MD [facility] Pager [phone]  If 7PM-7AM, please contact night-coverage [email or web address] Password [email or web address] 05/08/2017, 8:51 AM

## 2017-05-08 NOTE — Progress Notes (Signed)
Paged by bedside RN regarding concerns over patients HR. It has been consistently running in the 160's-180's while on Amio gtt at 30mg /hr. All other VSS. Pt is asymptomatic except she is experiencing some N/V which is being treated with Zofran IV and Reglan IV. Spoke to Dr. Dimple CaseyPauley with Cardiology who recommended adding 12.5mg  of Metoprolol PO since pt has experienced hypotension with Cardizem. We will continue to monitor. Appreciate Cardiologies input and recommendations.   Stevie Kernharles Benedict Kue NP-C, AGPCNP-BC [facility] Pager [phone]

## 2017-05-08 NOTE — Progress Notes (Signed)
ANTICOAGULATION CONSULT NOTE  Pharmacy Consult for Lovenox and Coumadin Indication: atrial fibrillation  Allergies  Allergen Reactions  . Codeine     REACTION: nausea  . Invokana [Canagliflozin]   . Naproxen Other (See Comments)    Abdominal pain  . Azithromycin Other (See Comments)    Hospital reaction   Patient Measurements: Height: 4\' 11"  (149.9 cm) Weight: 123 lb 7.3 oz (56 kg) IBW/kg (Calculated) : 43.2  Vital Signs: Temp: 99 F (37.2 C) (01/04 0400) Temp Source: Oral (01/04 0400) BP: 121/67 (01/04 0830) Pulse Rate: 143 (01/04 0830)  Labs: Recent Labs    05/05/17 1322 05/05/17 2141 05/05/17 2251 05/06/17 0325 05/06/17 1039 05/07/17 0643 05/08/17 0421  HGB 12.0  --   --  11.2*  --   --  10.8*  HCT 38.9  --   --  34.9*  --   --  34.5*  PLT 226  --   --  190  --   --  218  LABPROT  --   --  13.8  --   --  19.2* 28.2*  INR  --   --  1.07  --   --  1.64 2.67  CREATININE 1.16*  --   --  1.19*  --   --   --   TROPONINI <0.03 <0.03  --  <0.03 <0.03  --   --    Estimated Creatinine Clearance: 30.7 mL/min (A) (by C-G formula based on SCr of 1.19 mg/dL (H)).  No bleeding noted.  INR trending up.  Assessment: 77 yo female presented to ED with new onset afib. INR increased today to 2.67  Lovenox to bridge with coumadin.  Goal of Therapy:  INR 2-3 Monitor platelets by anticoagulation protocol: Yes   Plan:  No coumadin today due to large increase in INR. D/C Lovenox  PT-INR daily Monitor for S/S of bleeding Coumadin education  Woodfin GanjaSeay, Carder Yin Poteet, RPH 05/08/2017,8:48 AM

## 2017-05-08 NOTE — Progress Notes (Signed)
Subjective: Feels like her "stomach is settling" today. Less nausea, minimal vomiting (3 am and then a scant amount just prior to visit). No other abdominal pain. Having odynophagia and dysphagia. No bowel movmenet but has been eating minimal amounts and first dose of Linzess this morning. No other GI complaints.  Objective: Vital signs in last 24 hours: Temp:  [97.9 F (36.6 C)-99.6 F (37.6 C)] 99 F (37.2 C) (01/04 0400) Pulse Rate:  [70-149] 143 (01/04 0830) Resp:  [12-35] 20 (01/04 0830) BP: (91-163)/(44-117) 121/67 (01/04 0830) SpO2:  [87 %-99 %] 95 % (01/04 0830) FiO2 (%):  [28 %] 28 % (01/03 2300) Weight:  [123 lb 7.3 oz (56 kg)] 123 lb 7.3 oz (56 kg) (01/04 0500) Last BM Date: 05/04/17 General:   Alert and oriented, pleasant Head:  Normocephalic and atraumatic. Eyes:  No icterus, sclera clear. Conjuctiva pink.  Heart:  Irregularly irregular, tachycardic.  Lungs: Clear to auscultation bilaterally, without wheezing, rales, or rhonchi.  Abdomen:  Bowel sounds present, soft, non-tender, non-distended. No HSM or hernias noted. No rebound or guarding. Msk:  Symmetrical without gross deformities. Pulses:  Normal bilateral DP pulses noted. Extremities:  Without clubbing or edema. Neurologic:  Alert and  oriented x4;  grossly normal neurologically. Psych:  Alert and cooperative. Normal mood and affect.  Intake/Output from previous day: 01/03 0701 - 01/04 0700 In: 471.3 [I.V.:471.3] Out: 1250 [Urine:1250] Intake/Output this shift: No intake/output data recorded.  Lab Results: Recent Labs    05/05/17 1322 05/06/17 0325 05/08/17 0421  WBC 9.0 6.4 7.2  HGB 12.0 11.2* 10.8*  HCT 38.9 34.9* 34.5*  PLT 226 190 218   BMET Recent Labs    05/05/17 1322 05/06/17 0325  NA 138 142  K 4.8 4.1  CL 105 108  CO2 20* 24  GLUCOSE 314* 127*  BUN 42* 43*  CREATININE 1.16* 1.19*  CALCIUM 8.9 8.7*   LFT No results for input(s): PROT, ALBUMIN, AST, ALT, ALKPHOS, BILITOT,  BILIDIR, IBILI in the last 72 hours. PT/INR Recent Labs    05/07/17 0643 05/08/17 0421  LABPROT 19.2* 28.2*  INR 1.64 2.67   Hepatitis Panel No results for input(s): HEPBSAG, HCVAB, HEPAIGM, HEPBIGM in the last 72 hours.   Studies/Results: No results found.  Assessment: 77 year old female with a history of diabetes admitted for new A. fib and rapid ventricular response currently in the ICU on a Cardizem "wean off of Cardizem to p.o. medications.  Per nursing staff the patient began having nausea and vomiting yesterday afternoon and since then has had a difficult time keeping down food and fluids.  Concerns about QTc prolongation and ATC Zofran was deferred to prn Zofran, prn Phenergan, and ATC Reglan..  CBC yesterday with very mild anemia at a at 12.0, likely some hydration effect. Stable today 11.8 on Coumadin.  Kidney function stable with a creatinine of 1.19.  Electrolytes otherwise essentially normal.  No history of gastric emptying study.  She was noted to have GERD, N/V, and constipation. Continued Protonix bid, started Zofran prn, Phenergan prn, Reglan scheduled with meals. Nausea has improved today over yesterday. First dose Linzess this morning and no BM yet, but minimal recent intake as well as recent first dose.  Stable/slight decline in hgb from 12.2 to 11.8, stable and some hydration effect. No obvious bleeding on coumadin with therapeutic INR.   Complains of esophageal pain and dysphagia. Remains too unstable for endoscopic evaluation, may have stricture, web, ring, esophagitis mediated dysphagia, candida esophagus. May  benefit from EGD, if willing, when she stabalizes or as outpatient.  Plan: 1. Continue current medications 2. Monitor for bleeding 3. Monitor hgb trend 4. If esophageal pain continues, can consider viscous lidocaine prn 5. Possible EGD when stable 6. Supportive measures    Thank you for allowing [place] to participate in the care of Kashawn J  Burkhammer  Wynne Dust, DNP, AGNP-C Adult & Gerontological Nurse Practitioner [facility]     LOS: 3 days    05/08/2017, 8:55 AM

## 2017-05-08 NOTE — Care Management (Signed)
CM consult noted for Home health needs.  Patient will need PT consult when appropriate. CM continues to follow.

## 2017-05-09 ENCOUNTER — Inpatient Hospital Stay (HOSPITAL_COMMUNITY): Payer: Medicare HMO

## 2017-05-09 DIAGNOSIS — R739 Hyperglycemia, unspecified: Secondary | ICD-10-CM

## 2017-05-09 LAB — COMPREHENSIVE METABOLIC PANEL
ALBUMIN: 3.6 g/dL (ref 3.5–5.0)
ALT: 29 U/L (ref 14–54)
AST: 31 U/L (ref 15–41)
Alkaline Phosphatase: 80 U/L (ref 38–126)
Anion gap: 11 (ref 5–15)
BUN: 24 mg/dL — AB (ref 6–20)
CHLORIDE: 105 mmol/L (ref 101–111)
CO2: 29 mmol/L (ref 22–32)
CREATININE: 1.26 mg/dL — AB (ref 0.44–1.00)
Calcium: 8.7 mg/dL — ABNORMAL LOW (ref 8.9–10.3)
GFR calc Af Amer: 47 mL/min — ABNORMAL LOW (ref >60)
GFR, EST NON AFRICAN AMERICAN: 40 mL/min — AB (ref >60)
GLUCOSE: 80 mg/dL (ref 65–99)
POTASSIUM: 3.5 mmol/L (ref 3.5–5.1)
Sodium: 145 mmol/L (ref 135–145)
Total Bilirubin: 0.8 mg/dL (ref 0.3–1.2)
Total Protein: 7.1 g/dL (ref 6.5–8.1)

## 2017-05-09 LAB — CBC WITH DIFFERENTIAL/PLATELET
Basophils Absolute: 0 10*3/uL (ref 0.0–0.1)
Basophils Relative: 0 %
EOS ABS: 0 10*3/uL (ref 0.0–0.7)
EOS PCT: 0 %
HCT: 40.7 % (ref 36.0–46.0)
Hemoglobin: 12.3 g/dL (ref 12.0–15.0)
LYMPHS PCT: 5 %
Lymphs Abs: 0.5 10*3/uL — ABNORMAL LOW (ref 0.7–4.0)
MCH: 30.8 pg (ref 26.0–34.0)
MCHC: 30.2 g/dL (ref 30.0–36.0)
MCV: 101.8 fL — AB (ref 78.0–100.0)
MONO ABS: 0.5 10*3/uL (ref 0.1–1.0)
MONOS PCT: 5 %
Neutro Abs: 9 10*3/uL — ABNORMAL HIGH (ref 1.7–7.7)
Neutrophils Relative %: 90 %
PLATELETS: 262 10*3/uL (ref 150–400)
RBC: 4 MIL/uL (ref 3.87–5.11)
RDW: 13.4 % (ref 11.5–15.5)
WBC: 10.1 10*3/uL (ref 4.0–10.5)

## 2017-05-09 LAB — PROTIME-INR
INR: 2.35
PROTHROMBIN TIME: 25.5 s — AB (ref 11.4–15.2)

## 2017-05-09 LAB — MAGNESIUM: MAGNESIUM: 2.1 mg/dL (ref 1.7–2.4)

## 2017-05-09 LAB — GLUCOSE, CAPILLARY
GLUCOSE-CAPILLARY: 386 mg/dL — AB (ref 65–99)
Glucose-Capillary: 257 mg/dL — ABNORMAL HIGH (ref 65–99)
Glucose-Capillary: 79 mg/dL (ref 65–99)
Glucose-Capillary: 97 mg/dL (ref 65–99)

## 2017-05-09 LAB — TSH: TSH: 1.859 u[IU]/mL (ref 0.350–4.500)

## 2017-05-09 MED ORDER — INSULIN GLARGINE 100 UNIT/ML ~~LOC~~ SOLN: 5.0000 [IU] | Freq: Every day | SUBCUTANEOUS | Status: DC

## 2017-05-09 MED ORDER — INSULIN GLARGINE 100 UNIT/ML ~~LOC~~ SOLN
8.0000 [IU] | Freq: Every day | SUBCUTANEOUS | Status: DC
  Administered 2017-05-09: 8 [IU] via SUBCUTANEOUS
  Filled 2017-05-09 (×3): qty 0.08

## 2017-05-09 MED ORDER — DEXTROSE-NACL 5-0.45 % IV SOLN
INTRAVENOUS | Status: DC
  Administered 2017-05-09: 08:00:00 via INTRAVENOUS

## 2017-05-09 MED ORDER — INSULIN ASPART 100 UNIT/ML ~~LOC~~ SOLN
0.0000 [IU] | Freq: Three times a day (TID) | SUBCUTANEOUS | Status: DC
  Administered 2017-05-09 – 2017-05-10 (×2): 9 [IU] via SUBCUTANEOUS
  Administered 2017-05-10: 1 [IU] via SUBCUTANEOUS
  Administered 2017-05-10: 9 [IU] via SUBCUTANEOUS
  Administered 2017-05-11: 2 [IU] via SUBCUTANEOUS
  Administered 2017-05-11 (×2): 1 [IU] via SUBCUTANEOUS
  Administered 2017-05-12: 3 [IU] via SUBCUTANEOUS
  Administered 2017-05-12: 2 [IU] via SUBCUTANEOUS
  Administered 2017-05-13: 5 [IU] via SUBCUTANEOUS
  Administered 2017-05-13: 2 [IU] via SUBCUTANEOUS
  Administered 2017-05-13: 1 [IU] via SUBCUTANEOUS
  Administered 2017-05-14: 7 [IU] via SUBCUTANEOUS
  Administered 2017-05-14: 1 [IU] via SUBCUTANEOUS
  Administered 2017-05-15: 3 [IU] via SUBCUTANEOUS
  Administered 2017-05-15: 2 [IU] via SUBCUTANEOUS
  Administered 2017-05-16: 1 [IU] via SUBCUTANEOUS
  Administered 2017-05-16: 2 [IU] via SUBCUTANEOUS
  Administered 2017-05-17: 3 [IU] via SUBCUTANEOUS
  Administered 2017-05-17: 2 [IU] via SUBCUTANEOUS
  Administered 2017-05-17: 3 [IU] via SUBCUTANEOUS
  Administered 2017-05-18: 2 [IU] via SUBCUTANEOUS
  Administered 2017-05-18 – 2017-05-19 (×2): 1 [IU] via SUBCUTANEOUS
  Administered 2017-05-19 (×2): 2 [IU] via SUBCUTANEOUS
  Administered 2017-05-20: 5 [IU] via SUBCUTANEOUS
  Administered 2017-05-20 (×2): 3 [IU] via SUBCUTANEOUS
  Administered 2017-05-21: 7 [IU] via SUBCUTANEOUS
  Administered 2017-05-21: 5 [IU] via SUBCUTANEOUS
  Administered 2017-05-21: 3 [IU] via SUBCUTANEOUS
  Administered 2017-05-22: 5 [IU] via SUBCUTANEOUS
  Administered 2017-05-22: 9 [IU] via SUBCUTANEOUS
  Administered 2017-05-22 – 2017-05-23 (×2): 2 [IU] via SUBCUTANEOUS
  Administered 2017-05-23 (×2): 3 [IU] via SUBCUTANEOUS
  Administered 2017-05-24: 2 [IU] via SUBCUTANEOUS
  Administered 2017-05-24: 7 [IU] via SUBCUTANEOUS
  Administered 2017-05-24: 5 [IU] via SUBCUTANEOUS
  Administered 2017-05-25 (×2): 2 [IU] via SUBCUTANEOUS

## 2017-05-09 MED ORDER — INSULIN ASPART 100 UNIT/ML ~~LOC~~ SOLN: 0.0000 [IU] | Freq: Every day | SUBCUTANEOUS | Status: DC

## 2017-05-09 MED ORDER — INSULIN GLARGINE 100 UNIT/ML ~~LOC~~ SOLN
8.0000 [IU] | Freq: Every day | SUBCUTANEOUS | Status: DC
  Filled 2017-05-09 (×3): qty 0.08

## 2017-05-09 MED ORDER — INSULIN ASPART 100 UNIT/ML ~~LOC~~ SOLN
4.0000 [IU] | Freq: Three times a day (TID) | SUBCUTANEOUS | Status: DC
  Administered 2017-05-09: 4 [IU] via SUBCUTANEOUS

## 2017-05-09 NOTE — Progress Notes (Signed)
PROGRESS NOTE    Ellen Woods  ONG:[id number] DOB: [birth year] DOA: 05/05/2017 PCP: Dettinger, Elige Radon, MD   Brief Narrative:  77 year old woman admitted from home on 1/1 due to palpitations.  Found to be in new onset A. fib with RVR.  Admission was requested.  Assessment & Plan:   Principal Problem:   Atrial fibrillation with RVR (HCC) Active Problems:   Mixed hyperlipidemia   Essential hypertension, benign   Chronic diastolic heart failure (HCC)   Diabetes type 2, uncontrolled (HCC)   Hypothyroidism   Uncontrolled type 2 diabetes mellitus with hyperglycemia (HCC)   Atrial fibrillation, new onset (HCC)   Congestive heart failure (HCC)   Intractable cyclical vomiting with nausea   Esophageal dysphagia   Esophageal pain  Atrial fibrillation with rapid ventricular response -Initially Started back on IV Cardizem drip but not able to control rate, cardiology consulted for assistance with rate control and started on IV amiodarone, HR still uncontrolled this morning.  Pt not symptomatic. HR remains uncontrolled, cardiology added lopressor 12.5 mg BID last night.  Per cardiology, would continue IV amiodarone until she converts, if she does not then they likely will need to do a cardioversion on Monday.  Would need to make NPO Sunday night.    -Her CHADS VASC score is at least 5 and as such has been started on Coumadin with a Lovenox bridge.  Now off lovenox, warfarin therapeutic. Following with pharmacist.   Intractable nausea and vomiting - Symptoms persist today, she is not medically stable enough for EGD, continue medical treatments with IV nausea medications.  GI is following.     CHF, unspecified type -Suspect this is diastolic,  Echo with normal LV size and function EF 60-65%.  Unable to assess diastolic function due to rapid Afib.  -Pt diuresed 1.4L since admission.        Diabetes Mellitus type 2 insulin requiring CBG   Recent Labs    05/08/17 1606 05/08/17 2203  05/08/17 2312  GLUCAP 74 44* 110*   Pt had some hypoglycemia and not eating well.   DC lantus for now.  Continue sliding scale coverage if needed Remains on full liquid diet but not eating.  Will start gentle D51/2 NS infusion x 12 hours.  Acute Kidney Injury - likely prerenal from GI losses and poor oral intake. IVFs ordered.   Hypothyroidism -Continue Synthroid, TSH was WNL 2 months ago  Hyperlipidemia -Continue Crestor  Depression and anxiety -Continue Cymbalta and Xanax  Hypertension -Blood pressure soft but holding stable, following closely in SDU.   DVT prophylaxis: Lovenox Code Status: Full code Family Communication:  Disposition Plan: Not medically ready  Consultants:   Cardiology  Procedures:   2D echo Study Conclusions  - Left ventricle: The cavity size was normal. Wall thickness was  normal. Systolic function was normal. The estimated ejection   fraction was in the range of 60% to 65%. Wall motion was normal;  there were no regional wall motion abnormalities. - Aortic valve: Mildly calcified annulus. Trileaflet; normal thickness leaflets. Valve area (VTI): 1.87 cm^2. - Mitral valve: There was mild regurgitation. - Left atrium: The atrium was moderately dilated. - Right atrium: The atrium was mildly dilated. - Atrial septum: No defect or patent foramen ovale was identified. - Pulmonary arteries: Systolic pressure was mildly to moderately increased. PA peak pressure: 40 mm Hg (S).  Antimicrobials:  Anti-infectives (From admission, onward)   None     Subjective: Pt sick on stomach, vomited all night,  unable to keep down gingerale, feels ill. HR still uncontrolled.     Objective: Vitals:   05/09/17 0200 05/09/17 0300 05/09/17 0400 05/09/17 0500  BP: 131/77 139/84 (!) 142/92 138/89  Pulse: (!) 156 (!) 160 (!) 161 (!) 159  Resp: 16 (!) 40 (!) 26 (!) 27  Temp:   100.3 F (37.9 C)   TempSrc:   Oral   SpO2: 92% 92% 91% 91%  Weight:   55.6 kg (122 lb  9.2 oz)   Height:        Intake/Output Summary (Last 24 hours) at 05/09/2017 0734 Last data filed at 05/09/2017 0200 Gross per 24 hour  Intake 574 ml  Output 600 ml  Net -26 ml   Filed Weights   05/07/17 0500 05/08/17 0500 05/09/17 0400  Weight: 56.3 kg (124 lb 1.9 oz) 56 kg (123 lb 7.3 oz) 55.6 kg (122 lb 9.2 oz)    Examination:  General exam: Alert, awake, oriented x 3 Respiratory system: improved, fairly clear today Cardiovascular system: Tachycardic, irregular Gastrointestinal system: Abdomen is nondistended, soft and nontender. No organomegaly or masses felt. Normal bowel sounds heard. Central nervous system: Alert and oriented. No focal neurological deficits. Extremities: no pretibial edema, positive pedal pulses Skin: No rashes, lesions or ulcers Psychiatry: Judgement and insight appear normal. Mood & affect appropriate.   Data Reviewed: I have personally reviewed following labs and imaging studies  CBC: Recent Labs  Lab 05/05/17 1322 05/06/17 0325 05/08/17 0421 05/09/17 0438  WBC 9.0 6.4 7.2 10.1  NEUTROABS 7.6  --  5.8 9.0*  HGB 12.0 11.2* 10.8* 12.3  HCT 38.9 34.9* 34.5* 40.7  MCV 101.0* 99.4 100.6* 101.8*  PLT 226 190 218 262   Basic Metabolic Panel: Recent Labs  Lab 05/05/17 1322 05/06/17 0325 05/08/17 0421 05/09/17 0438  NA 138 142 141 145  K 4.8 4.1 3.5 3.5  CL 105 108 104 105  CO2 20* 24 24 29   GLUCOSE 314* 127* 81 80  BUN 42* 43* 35* 24*  CREATININE 1.16* 1.19* 1.42* 1.26*  CALCIUM 8.9 8.7* 8.0* 8.7*  MG  --   --  2.0 2.1   GFR: Estimated Creatinine Clearance: 28.9 mL/min (A) (by C-G formula based on SCr of 1.26 mg/dL (H)). Liver Function Tests: Recent Labs  Lab 05/08/17 0421 05/09/17 0438  AST 20 31  ALT 30 29  ALKPHOS 69 80  BILITOT 0.8 0.8  PROT 5.8* 7.1  ALBUMIN 3.1* 3.6   No results for input(s): LIPASE, AMYLASE in the last 168 hours. No results for input(s): AMMONIA in the last 168 hours. Coagulation Profile: Recent Labs   Lab 05/05/17 2251 05/07/17 0643 05/08/17 0421  INR 1.07 1.64 2.67   Cardiac Enzymes: Recent Labs  Lab 05/05/17 1322 05/05/17 2141 05/06/17 0325 05/06/17 1039  TROPONINI <0.03 <0.03 <0.03 <0.03   BNP (last 3 results) No results for input(s): PROBNP in the last 8760 hours. HbA1C: No results for input(s): HGBA1C in the last 72 hours. CBG: Recent Labs  Lab 05/08/17 0904 05/08/17 1124 05/08/17 1606 05/08/17 2203 05/08/17 2312  GLUCAP 95 99 74 44* 110*   Lipid Profile: No results for input(s): CHOL, HDL, LDLCALC, TRIG, CHOLHDL, LDLDIRECT in the last 72 hours. Thyroid Function Tests: Recent Labs    05/08/17 0300  TSH 1.159   Anemia Panel: No results for input(s): VITAMINB12, FOLATE, FERRITIN, TIBC, IRON, RETICCTPCT in the last 72 hours. Urine analysis:    Component Value Date/Time   COLORURINE YELLOW 03/14/2014 1227  APPEARANCEUR CLEAR 03/14/2014 1227   LABSPEC 1.015 03/14/2014 1227   PHURINE 5.0 03/14/2014 1227   GLUCOSEU 100 (A) 03/14/2014 1227   HGBUR NEGATIVE 03/14/2014 1227   BILIRUBINUR neg 06/13/2015 1710   KETONESUR NEGATIVE 03/14/2014 1227   PROTEINUR neg 06/13/2015 1710   PROTEINUR NEGATIVE 03/14/2014 1227   UROBILINOGEN negative 06/13/2015 1710   UROBILINOGEN 0.2 03/14/2014 1227   NITRITE neg 06/13/2015 1710   NITRITE NEGATIVE 03/14/2014 1227   LEUKOCYTESUR Negative 06/13/2015 1710    Recent Results (from the past 240 hour(s))  MRSA PCR Screening     Status: None   Collection Time: 05/05/17  9:20 PM  Result Value Ref Range Status   MRSA by PCR NEGATIVE NEGATIVE Final    Comment:        The GeneXpert MRSA Assay (FDA approved for NASAL specimens only), is one component of a comprehensive MRSA colonization surveillance program. It is not intended to diagnose MRSA infection nor to guide or monitor treatment for MRSA infections.     Radiology Studies: No results found. Scheduled Meds: . DULoxetine  30 mg Oral Daily  . feeding  supplement (GLUCERNA SHAKE)  237 mL Oral TID BM  . insulin aspart  0-9 Units Subcutaneous TID WC  . insulin glargine  5 Units Subcutaneous Q breakfast  . levothyroxine  50 mcg Oral QAC breakfast  . linaclotide  145 mcg Oral QAC breakfast  . metoCLOPramide (REGLAN) injection  10 mg Intravenous TID AC  . metoprolol tartrate  12.5 mg Oral BID  . pantoprazole (PROTONIX) IV  40 mg Intravenous BID AC  . rosuvastatin  5 mg Oral Daily   Continuous Infusions: . amiodarone 30 mg/hr (05/09/17 0350)    LOS: 4 days   Critical Care Time spent: 33 minutes. Greater than 50% of this time was spent in direct contact with the patient coordinating care.  Standley Dakinslanford Johnson, MD [facility] Pager [phone]  If 7PM-7AM, please contact night-coverage [email or web address] Password [facility] 05/09/2017, 7:34 AM

## 2017-05-09 NOTE — Progress Notes (Addendum)
Patient ID: Ellen HaverGlenda J Woods, female   DOB: 09/05/1940, 77 y.o.   MRN: [id number]   Assessment/Plan: ADMITTED WITH AFIB RVR AND AFTER ADMISSION STARTED HAVING NAUSEA AND VOMITING. NAUSEA/VOMITING SEEMS TO GET WORSE WHEN SHE IS EXPERIENCING ELEVATED HEART RATE AND PALPITATIONS. SLIGHTLY IMPROVED  THIS AM.  PLAN: 1. CONTINUE REGLAN & PHENERGAN PRN. NO NEED FOR PRN ZOFRAN. 2. CONTINUE FULL LIQUID DIET AND GLUCERNA TID BM 3. HOLD COUMADIN. DISCUSSED WITH DR. BRANCH START HEPARIN GTT WHEN INR < 2.0. PLAN FOR TEE/CARDIOVERSION MON. DISCUSSED WITH DR. BRANCH. 4. EGD AN AN INPT ONLY IF UNABLE TO ADVANCE DIET AFTER HR IDEALLY CONTROLLED.  GREATER THAN 50% WAS SPENT IN COUNSELING & COORDINATION OF CARE WITH THE PATIENT AND HER FAMILY: DISCUSSED DIFFERENTIAL DIAGNOSIS, PROCEDURES, BENEFITS, RISKS, AND MANAGEMENT OF VOMITING IN PT WITH ACUTE CV ISSUES AND VOMITING. TOTAL ENCOUNTER TIME: 35 MINS.    Subjective: Since I last evaluated the patient SHE TOLERATED BREAKFAST. DAUGHTER AND MANFRIEND IN ROOM. HAD A ROUGH NIGHT DUE TO CHEST PRESSURE, RACING HEART, AND NAUSEA/VOMITING. STILL ON AMIO GTT AND IN AFIB. PLAN FOR TEE/CARDIOVERSION ON MON. NO AGITATION, DRAINAGE FROM HER BREAST, TARDIVE DYSKINESIA, OR CHANGES IN VISION.   Objective: Vital signs in last 24 hours: Vitals:   05/09/17 0751 05/09/17 0800  BP:  (!) 118/97  Pulse: (!) 162 (!) 161  Resp: (!) 25 (!) 23  Temp: 99.9 F (37.7 C)   SpO2: 94% 93%   General appearance: alert, cooperative and no distress Resp: clear to auscultation bilaterally Cardio: irregularly irregular rhythm GI: soft, non-tender; bowel sounds normal;  Extremities: extremities normal, atraumatic, no cyanosis or edema  Lab Results: Cr 1.26, NL HFP, Hb 12.3 INR 2.3    Studies/Results: No results found.  Medications: I have reviewed the patient's current medications.   LOS: 5 days

## 2017-05-09 NOTE — Progress Notes (Signed)
CBG at 2313  -  110

## 2017-05-09 NOTE — Progress Notes (Signed)
[facility] called inquiring about the course of action of patient's extended duration elevated heart rate, referred to notes and Cardiology Plan. Also aware of multiple calls from ICU to mid-level and Hospitalist and from Hospitalist/mid-level to Cardiology, et al

## 2017-05-10 DIAGNOSIS — E162 Hypoglycemia, unspecified: Secondary | ICD-10-CM | POA: Diagnosis not present

## 2017-05-10 LAB — COMPREHENSIVE METABOLIC PANEL
ALT: 27 U/L (ref 14–54)
AST: 28 U/L (ref 15–41)
Albumin: 2.9 g/dL — ABNORMAL LOW (ref 3.5–5.0)
Alkaline Phosphatase: 72 U/L (ref 38–126)
Anion gap: 10 (ref 5–15)
BUN: 33 mg/dL — AB (ref 6–20)
CHLORIDE: 101 mmol/L (ref 101–111)
CO2: 27 mmol/L (ref 22–32)
Calcium: 8.2 mg/dL — ABNORMAL LOW (ref 8.9–10.3)
Creatinine, Ser: 1.48 mg/dL — ABNORMAL HIGH (ref 0.44–1.00)
GFR calc Af Amer: 38 mL/min — ABNORMAL LOW (ref >60)
GFR, EST NON AFRICAN AMERICAN: 33 mL/min — AB (ref >60)
Glucose, Bld: 371 mg/dL — ABNORMAL HIGH (ref 65–99)
Potassium: 4.6 mmol/L (ref 3.5–5.1)
Sodium: 138 mmol/L (ref 135–145)
Total Bilirubin: 0.5 mg/dL (ref 0.3–1.2)
Total Protein: 5.9 g/dL — ABNORMAL LOW (ref 6.5–8.1)

## 2017-05-10 LAB — CBC WITH DIFFERENTIAL/PLATELET
Basophils Absolute: 0 10*3/uL (ref 0.0–0.1)
Basophils Relative: 0 %
EOS ABS: 0 10*3/uL (ref 0.0–0.7)
EOS PCT: 0 %
HCT: 33.8 % — ABNORMAL LOW (ref 36.0–46.0)
Hemoglobin: 10.7 g/dL — ABNORMAL LOW (ref 12.0–15.0)
LYMPHS ABS: 0.4 10*3/uL — AB (ref 0.7–4.0)
Lymphocytes Relative: 5 %
MCH: 32 pg (ref 26.0–34.0)
MCHC: 31.7 g/dL (ref 30.0–36.0)
MCV: 101.2 fL — ABNORMAL HIGH (ref 78.0–100.0)
MONOS PCT: 6 %
Monocytes Absolute: 0.5 10*3/uL (ref 0.1–1.0)
Neutro Abs: 7 10*3/uL (ref 1.7–7.7)
Neutrophils Relative %: 89 %
PLATELETS: 211 10*3/uL (ref 150–400)
RBC: 3.34 MIL/uL — ABNORMAL LOW (ref 3.87–5.11)
RDW: 13.5 % (ref 11.5–15.5)
WBC: 7.9 10*3/uL (ref 4.0–10.5)

## 2017-05-10 LAB — GLUCOSE, CAPILLARY
GLUCOSE-CAPILLARY: 157 mg/dL — AB (ref 65–99)
GLUCOSE-CAPILLARY: 23 mg/dL — AB (ref 65–99)
GLUCOSE-CAPILLARY: 337 mg/dL — AB (ref 65–99)
GLUCOSE-CAPILLARY: 408 mg/dL — AB (ref 65–99)
GLUCOSE-CAPILLARY: 77 mg/dL (ref 65–99)
Glucose-Capillary: 136 mg/dL — ABNORMAL HIGH (ref 65–99)
Glucose-Capillary: 20 mg/dL — CL (ref 65–99)
Glucose-Capillary: 28 mg/dL — CL (ref 65–99)
Glucose-Capillary: 345 mg/dL — ABNORMAL HIGH (ref 65–99)

## 2017-05-10 LAB — PROTIME-INR
INR: 2.33
Prothrombin Time: 25.4 seconds — ABNORMAL HIGH (ref 11.4–15.2)

## 2017-05-10 LAB — MAGNESIUM: MAGNESIUM: 2.1 mg/dL (ref 1.7–2.4)

## 2017-05-10 MED ORDER — DEXTROSE 50 % IV SOLN
INTRAVENOUS | Status: AC
  Filled 2017-05-10: qty 50

## 2017-05-10 MED ORDER — DEXTROSE 50 % IV SOLN
50.0000 mL | Freq: Once | INTRAVENOUS | Status: AC
  Administered 2017-05-10: 50 mL via INTRAVENOUS

## 2017-05-10 MED ORDER — INSULIN ASPART 100 UNIT/ML ~~LOC~~ SOLN: 3.0000 [IU] | Freq: Three times a day (TID) | SUBCUTANEOUS | Status: DC

## 2017-05-10 MED ORDER — INSULIN GLARGINE 100 UNIT/ML ~~LOC~~ SOLN
6.0000 [IU] | Freq: Every day | SUBCUTANEOUS | Status: DC
  Administered 2017-05-10 – 2017-05-21 (×12): 6 [IU] via SUBCUTANEOUS
  Filled 2017-05-10 (×14): qty 0.06

## 2017-05-10 MED ORDER — LINACLOTIDE 145 MCG PO CAPS
290.0000 ug | ORAL_CAPSULE | Freq: Every day | ORAL | Status: DC
  Administered 2017-05-10 – 2017-05-25 (×14): 290 ug via ORAL
  Filled 2017-05-10 (×14): qty 2

## 2017-05-10 MED ORDER — INSULIN ASPART 100 UNIT/ML ~~LOC~~ SOLN
2.0000 [IU] | Freq: Three times a day (TID) | SUBCUTANEOUS | Status: DC
  Administered 2017-05-10 – 2017-05-22 (×22): 2 [IU] via SUBCUTANEOUS

## 2017-05-10 NOTE — Progress Notes (Signed)
Inpatient Diabetes Program Recommendations  AACE/ADA: New Consensus Statement on Inpatient Glycemic Control (2015)  Target Ranges:  Prepandial:   less than 140 mg/dL      Peak postprandial:   less than 180 mg/dL (1-2 hours)      Critically ill patients:  140 - 180 mg/dL   Lab Results  Component Value Date   GLUCAP 408 (H) 05/10/2017   HGBA1C 7.2 (H) 05/05/2017    Review of Glycemic Control  Results for [name], Ellen J (MRN [id number]) as of 05/10/2017 09:52  Ref. Range 05/10/2017 00:41 05/10/2017 00:53 05/10/2017 01:20 05/10/2017 05:33 05/10/2017 07:51  Glucose-Capillary Latest Ref Range: 65 - 99 mg/dL 28 (LL) 23 (LL) 409157 (H) 337 (H) 408 (H)    Outpatient Diabetes medications: Lantus 14 units QAM, Novolog 3-5 units TID with meals  Current orders for Inpatient glycemic control: Lantus 8 units QAM, Novolog 0-9 units TID with meals, Novolog 2 units tid  Inpatient Diabetes Program Recommendations:  Continue Novolog 0-9 units tid but the dose must be calculated and given within 1 hour of the blood sugar being checked- this was likely the cause of the low blood sugar yesterday.  Please increase Lantus dose to Lantus 6 units bid.   Susette RacerJulie Rochele Lueck, RN, BA, MHA, CDE Diabetes Coordinator Inpatient Diabetes Program  [phone] (Team Pager) [phone] [facility] Office) 05/10/2017 10:03 AM

## 2017-05-10 NOTE — Progress Notes (Signed)
PROGRESS NOTE    Melody HaverGlenda J Pomplun  ZOX:[id number] DOB: 02/13/1941 DOA: 05/05/2017 PCP: Dettinger, Elige RadonJoshua A, MD   Brief Narrative:  77 year old woman admitted from home on 1/1 due to palpitations.  Found to be in new onset A. fib with RVR.  Admission was requested.  Assessment & Plan:   Principal Problem:   Atrial fibrillation with RVR (HCC) Active Problems:   Mixed hyperlipidemia   Essential hypertension, benign   Chronic diastolic heart failure (HCC)   Diabetes type 2, uncontrolled (HCC)   Hypothyroidism   Uncontrolled type 2 diabetes mellitus with hyperglycemia (HCC)   Atrial fibrillation, new onset (HCC)   Congestive heart failure (HCC)   Intractable cyclical vomiting with nausea   Esophageal dysphagia   Esophageal pain   Hyperglycemia   Hypoglycemia  Atrial fibrillation with rapid ventricular response -Initially Started back on IV Cardizem drip but not able to control rate, cardiology consulted for assistance with rate control and started on IV amiodarone, HR still uncontrolled this morning.  Pt not symptomatic. HR remains uncontrolled, cardiology added lopressor 12.5 mg BID last night.  Per cardiology, would continue IV amiodarone until she converts, if she does not then they likely will need to do a cardioversion on Monday.  NPO at midnight.    -Her CHADS VASC score is at least 5 and as such has been started on Coumadin with a Lovenox bridge.  Now off lovenox, warfarin therapeutic. Following with pharmacist.   Intractable nausea and vomiting - Symptoms persist today, she is not medically stable enough for EGD, continue medical treatments with IV nausea medications.  GI is following.     CHF, unspecified type -Suspect this is diastolic,  Echo with normal LV size and function EF 60-65%.  Unable to assess diastolic function due to rapid Afib.  -Pt diuresed 1.4L since admission.        Diabetes Mellitus type 2 insulin requiring CBG   Recent Labs    05/10/17 0120  05/10/17 0533 05/10/17 0751  GLUCAP 157* 337* 408*   Hypoglycemia DC lantus for now.  Continue sliding scale coverage if needed Remains on full liquid diet but not eating.  Ordered novolog 2 units TIDAC if eats 50% or more of meal.  DC HS coverage.  Add 3 am blood sugar testing.   Acute Kidney Injury on CKD stage 2 - likely some prerenal azotemia from GI losses and poor oral intake.   Hypothyroidism -Continue Synthroid, TSH was WNL 2 months ago  Hyperlipidemia -Continue Crestor  Depression and anxiety -Continue Cymbalta and Xanax  Hypertension -Blood pressure stable, following.   DVT prophylaxis: SCDs Code Status: Full code Family Communication:  Disposition Plan: Not medically ready  Consultants:   Cardiology  Procedures:   2D echo Study Conclusions  - Left ventricle: The cavity size was normal. Wall thickness was  normal. Systolic function was normal. The estimated ejection   fraction was in the range of 60% to 65%. Wall motion was normal;  there were no regional wall motion abnormalities. - Aortic valve: Mildly calcified annulus. Trileaflet; normal thickness leaflets. Valve area (VTI): 1.87 cm^2. - Mitral valve: There was mild regurgitation. - Left atrium: The atrium was moderately dilated. - Right atrium: The atrium was mildly dilated. - Atrial septum: No defect or patent foramen ovale was identified. - Pulmonary arteries: Systolic pressure was mildly to moderately increased. PA peak pressure: 40 mm Hg (S).  Antimicrobials:  Anti-infectives (From admission, onward)   None     Subjective: Pt  had a low blood sugar early in the morning. She is feeling a lot better now.  She plans to try to eat breakfast.      Objective: Vitals:   05/10/17 0500 05/10/17 0600 05/10/17 0700 05/10/17 0800  BP: (!) 101/59 108/69 124/79 (!) 123/101  Pulse: (!) 117 (!) 119 (!) 112 (!) 126  Resp: 16 (!) 32 17 (!) 23  Temp:    98.4 F (36.9 C)  TempSrc:    Oral  SpO2: 93% 94%  95% 93%  Weight:      Height:        Intake/Output Summary (Last 24 hours) at 05/10/2017 0835 Last data filed at 05/10/2017 0800 Gross per 24 hour  Intake 845.73 ml  Output -  Net 845.73 ml   Filed Weights   05/07/17 0500 05/08/17 0500 05/09/17 0400  Weight: 56.3 kg (124 lb 1.9 oz) 56 kg (123 lb 7.3 oz) 55.6 kg (122 lb 9.2 oz)    Examination:  General exam: Alert, awake, oriented x 3 Respiratory system: improved, fairly clear today Cardiovascular system: Tachycardic, irregular Gastrointestinal system: Abdomen is nondistended, soft and nontender. No organomegaly or masses felt. Normal bowel sounds heard. Central nervous system: Alert and oriented. No focal neurological deficits. Extremities: no pretibial edema, positive pedal pulses Skin: No rashes, lesions or ulcers Psychiatry: Judgement and insight appear normal. Mood & affect appropriate.   Data Reviewed: I have personally reviewed following labs and imaging studies  CBC: Recent Labs  Lab 05/05/17 1322 05/06/17 0325 05/08/17 0421 05/09/17 0438 05/10/17 0601  WBC 9.0 6.4 7.2 10.1 7.9  NEUTROABS 7.6  --  5.8 9.0* 7.0  HGB 12.0 11.2* 10.8* 12.3 10.7*  HCT 38.9 34.9* 34.5* 40.7 33.8*  MCV 101.0* 99.4 100.6* 101.8* 101.2*  PLT 226 190 218 262 211   Basic Metabolic Panel: Recent Labs  Lab 05/05/17 1322 05/06/17 0325 05/08/17 0421 05/09/17 0438 05/10/17 0601  NA 138 142 141 145 138  K 4.8 4.1 3.5 3.5 4.6  CL 105 108 104 105 101  CO2 20* 24 24 29 27   GLUCOSE 314* 127* 81 80 371*  BUN 42* 43* 35* 24* 33*  CREATININE 1.16* 1.19* 1.42* 1.26* 1.48*  CALCIUM 8.9 8.7* 8.0* 8.7* 8.2*  MG  --   --  2.0 2.1 2.1   GFR: Estimated Creatinine Clearance: 24.6 mL/min (A) (by C-G formula based on SCr of 1.48 mg/dL (H)). Liver Function Tests: Recent Labs  Lab 05/08/17 0421 05/09/17 0438 05/10/17 0601  AST 20 31 28   ALT 30 29 27   ALKPHOS 69 80 72  BILITOT 0.8 0.8 0.5  PROT 5.8* 7.1 5.9*  ALBUMIN 3.1* 3.6 2.9*   No  results for input(s): LIPASE, AMYLASE in the last 168 hours. No results for input(s): AMMONIA in the last 168 hours. Coagulation Profile: Recent Labs  Lab 05/05/17 2251 05/07/17 0643 05/08/17 0421 05/09/17 0806 05/10/17 0601  INR 1.07 1.64 2.67 2.35 2.33   Cardiac Enzymes: Recent Labs  Lab 05/05/17 1322 05/05/17 2141 05/06/17 0325 05/06/17 1039  TROPONINI <0.03 <0.03 <0.03 <0.03   BNP (last 3 results) No results for input(s): PROBNP in the last 8760 hours. HbA1C: No results for input(s): HGBA1C in the last 72 hours. CBG: Recent Labs  Lab 05/10/17 0041 05/10/17 0053 05/10/17 0120 05/10/17 0533 05/10/17 0751  GLUCAP 28* 23* 157* 337* 408*   Lipid Profile: No results for input(s): CHOL, HDL, LDLCALC, TRIG, CHOLHDL, LDLDIRECT in the last 72 hours. Thyroid Function Tests: Recent Labs  05/09/17 0806  TSH 1.859   Anemia Panel: No results for input(s): VITAMINB12, FOLATE, FERRITIN, TIBC, IRON, RETICCTPCT in the last 72 hours. Urine analysis:    Component Value Date/Time   COLORURINE YELLOW 03/14/2014 1227   APPEARANCEUR CLEAR 03/14/2014 1227   LABSPEC 1.015 03/14/2014 1227   PHURINE 5.0 03/14/2014 1227   GLUCOSEU 100 (A) 03/14/2014 1227   HGBUR NEGATIVE 03/14/2014 1227   BILIRUBINUR neg 06/13/2015 1710   KETONESUR NEGATIVE 03/14/2014 1227   PROTEINUR neg 06/13/2015 1710   PROTEINUR NEGATIVE 03/14/2014 1227   UROBILINOGEN negative 06/13/2015 1710   UROBILINOGEN 0.2 03/14/2014 1227   NITRITE neg 06/13/2015 1710   NITRITE NEGATIVE 03/14/2014 1227   LEUKOCYTESUR Negative 06/13/2015 1710    Recent Results (from the past 240 hour(s))  MRSA PCR Screening     Status: None   Collection Time: 05/05/17  9:20 PM  Result Value Ref Range Status   MRSA by PCR NEGATIVE NEGATIVE Final    Comment:        The GeneXpert MRSA Assay (FDA approved for NASAL specimens only), is one component of a comprehensive MRSA colonization surveillance program. It is not intended to  diagnose MRSA infection nor to guide or monitor treatment for MRSA infections.     Radiology Studies: Dg Chest Port 1 View  Result Date: 05/09/2017 CLINICAL DATA:  Injured fibrillation, cough, fever EXAM: PORTABLE CHEST 1 VIEW COMPARISON:  05/05/2017 FINDINGS: Bilateral diffuse mild interstitial thickening, right greater than left. Patchy right lung alveolar airspace opacities. Small right pleural effusion. Small left pleural effusion. No pneumothorax. Stable cardiomegaly. No acute osseous abnormality. IMPRESSION: Persistent CHF without significant interval change compared with 05/05/2017. Electronically Signed   By: Elige Ko   On: 05/09/2017 14:00   Scheduled Meds: . DULoxetine  30 mg Oral Daily  . feeding supplement (GLUCERNA SHAKE)  237 mL Oral TID BM  . insulin aspart  0-9 Units Subcutaneous TID WC  . insulin aspart  2 Units Subcutaneous TID WC  . levothyroxine  50 mcg Oral QAC breakfast  . linaclotide  145 mcg Oral QAC breakfast  . metoCLOPramide (REGLAN) injection  10 mg Intravenous TID AC  . metoprolol tartrate  12.5 mg Oral BID  . pantoprazole (PROTONIX) IV  40 mg Intravenous BID AC  . rosuvastatin  5 mg Oral Daily   Continuous Infusions: . amiodarone 30 mg/hr (05/10/17 0418)    LOS: 5 days   Critical Care Time spent: 32 minutes. Greater than 50% of this time was spent in direct contact with the patient coordinating care.  Standley Dakins, MD [facility] Pager [phone]  If 7PM-7AM, please contact night-coverage [email or web address] Password [email or web address] 05/10/2017, 8:35 AM

## 2017-05-10 NOTE — Progress Notes (Addendum)
Hypoglycemic Event  CBG: 28  Treatment: 2 juice cups of orange juice and 1 pack sugar added, and ensure chocolate shake, graham cracker with peanut butter.  Symptoms: Pt called on call bell and stated she felt her sugar was a little low, she can tell when her hair gets a little "wet". She is somewhat weak but is still alert and oriented X 4 able to talk and eat as normal.Pt is pale and sweaty on her back and back of head. Unit glucometer re-calibrated as the 2nd recheck was lower at 23. Rechecking now at 1 am on another glucometer while the other one is being re-calibrated and pulling amp of D50.  Follow-up CBG: Time:0100 CBG Result: 20  At 0100 gave 1 amp of D 50 while pt continued to eat and drink  Possible Reasons for Event: Pt states she at times will experience low blood sugar like this in the middle of the night  Comments/MD notified:will give entire amp of D50 due to 20 being a severly low blood sugar and will recheck.    RECHECK at 0120 am after amp of D50 and snacks was 157. Will continue to monitor through the night. Pt states she has had issues in the past with her blood sugar being uncontrolled up and down a lot. Possible Diabetes Cordinator consult needed.   MD made aware of episode and the results/interventions  Recheck at 0530 am is 337. MD is aware  Cristie Hemiffani K Sae Handrich, RN  1:26 AM 05/10/17

## 2017-05-10 NOTE — Progress Notes (Signed)
Patient ID: Ellen Woods, female   DOB: 06/04/1940, 77 y.o.   MRN: [id number]   Assessment/Plan: Admitted with Afib/RVR and continues with rate uncontrolled. NAUSEA/VOMITING improved.  Plan: 1. Continue Reglan. 2. Advance diet 3. PROBABLE TEE/CARDIOVERSION ON JAN 7   Subjective: Since I last evaluated the patient she vomited once last night. Kept down breakfast. Nausea better. Hr 115-130.  Objective: Vital signs in last 24 hours: Vitals:   05/10/17 0700 05/10/17 0800  BP: 124/79 (!) 123/101  Pulse: (!) 112 (!) 126  Resp: 17 (!) 23  Temp:  98.4 F (36.9 C)  SpO2: 95% 93%   General appearance: alert, cooperative and no distress Resp: clear to auscultation bilaterally Cardio: irregularly irregular rhythm GI: soft, non-tender; bowel sounds normal;   Lab Results:  INR 2.3 K4.6  Studies/Results: Dg Chest Port 1 View  Result Date: 05/09/2017 CLINICAL DATA:  Injured fibrillation, cough, fever EXAM: PORTABLE CHEST 1 VIEW COMPARISON:  05/05/2017 FINDINGS: Bilateral diffuse mild interstitial thickening, right greater than left. Patchy right lung alveolar airspace opacities. Small right pleural effusion. Small left pleural effusion. No pneumothorax. Stable cardiomegaly. No acute osseous abnormality. IMPRESSION: Persistent CHF without significant interval change compared with 05/05/2017. Electronically Signed   By: Elige KoHetal  Patel   On: 05/09/2017 14:00    Medications: I have reviewed the patient's current medications.   LOS: 5 days

## 2017-05-10 NOTE — Progress Notes (Signed)
Patient stated she felt nauseas after eating dinner. Only IV access at time was in right hand with amiodarone infusing. So placed another iv and gave phenergan 12.5 and flushed with 20 mls saline.

## 2017-05-11 ENCOUNTER — Inpatient Hospital Stay (HOSPITAL_COMMUNITY): Payer: Medicare HMO

## 2017-05-11 ENCOUNTER — Encounter (HOSPITAL_COMMUNITY)
Admission: EM | Disposition: A | Discharge status: Skilled Nursing Facility | Payer: Self-pay | Source: Home / Self Care | Attending: Family Medicine

## 2017-05-11 ENCOUNTER — Inpatient Hospital Stay (HOSPITAL_COMMUNITY): Payer: Medicare HMO | Admitting: Anesthesiology

## 2017-05-11 ENCOUNTER — Encounter (HOSPITAL_COMMUNITY): Payer: Self-pay | Admitting: Anesthesiology

## 2017-05-11 DIAGNOSIS — I10 Essential (primary) hypertension: Secondary | ICD-10-CM

## 2017-05-11 DIAGNOSIS — E782 Mixed hyperlipidemia: Secondary | ICD-10-CM

## 2017-05-11 DIAGNOSIS — I25119 Atherosclerotic heart disease of native coronary artery with unspecified angina pectoris: Secondary | ICD-10-CM

## 2017-05-11 DIAGNOSIS — I481 Persistent atrial fibrillation: Principal | ICD-10-CM

## 2017-05-11 HISTORY — PX: CARDIOVERSION: SHX1299

## 2017-05-11 HISTORY — PX: TEE WITHOUT CARDIOVERSION: SHX5443

## 2017-05-11 LAB — COMPREHENSIVE METABOLIC PANEL
ALK PHOS: 65 U/L (ref 38–126)
ALT: 24 U/L (ref 14–54)
ANION GAP: 11 (ref 5–15)
AST: 27 U/L (ref 15–41)
Albumin: 2.9 g/dL — ABNORMAL LOW (ref 3.5–5.0)
BILIRUBIN TOTAL: 0.5 mg/dL (ref 0.3–1.2)
BUN: 34 mg/dL — ABNORMAL HIGH (ref 6–20)
CALCIUM: 8.2 mg/dL — AB (ref 8.9–10.3)
CO2: 27 mmol/L (ref 22–32)
Chloride: 100 mmol/L — ABNORMAL LOW (ref 101–111)
Creatinine, Ser: 1.54 mg/dL — ABNORMAL HIGH (ref 0.44–1.00)
GFR calc non Af Amer: 32 mL/min — ABNORMAL LOW (ref >60)
GFR, EST AFRICAN AMERICAN: 37 mL/min — AB (ref >60)
GLUCOSE: 166 mg/dL — AB (ref 65–99)
Potassium: 3.9 mmol/L (ref 3.5–5.1)
Sodium: 138 mmol/L (ref 135–145)
TOTAL PROTEIN: 6 g/dL — AB (ref 6.5–8.1)

## 2017-05-11 LAB — GLUCOSE, CAPILLARY
GLUCOSE-CAPILLARY: 149 mg/dL — AB (ref 65–99)
GLUCOSE-CAPILLARY: 128 mg/dL — AB (ref 65–99)
GLUCOSE-CAPILLARY: 123 mg/dL — AB (ref 65–99)
GLUCOSE-CAPILLARY: 119 mg/dL — AB (ref 65–99)
Glucose-Capillary: 159 mg/dL — ABNORMAL HIGH (ref 65–99)
Glucose-Capillary: 37 mg/dL — CL (ref 65–99)
Glucose-Capillary: 139 mg/dL — ABNORMAL HIGH (ref 65–99)

## 2017-05-11 LAB — PROTIME-INR
INR: 1.83
Prothrombin Time: 21 seconds — ABNORMAL HIGH (ref 11.4–15.2)

## 2017-05-11 SURGERY — CARDIOVERSION
Anesthesia: Monitor Anesthesia Care

## 2017-05-11 MED ORDER — PROPOFOL 500 MG/50ML IV EMUL
INTRAVENOUS | Status: DC | PRN
  Administered 2017-05-11: 75 ug/kg/min via INTRAVENOUS

## 2017-05-11 MED ORDER — ENOXAPARIN SODIUM 60 MG/0.6ML ~~LOC~~ SOLN
60.0000 mg | SUBCUTANEOUS | Status: DC
  Administered 2017-05-11 – 2017-05-12 (×2): 60 mg via SUBCUTANEOUS
  Filled 2017-05-11 (×2): qty 0.6

## 2017-05-11 MED ORDER — SODIUM CHLORIDE 0.9 % IV SOLN: INTRAVENOUS | Status: DC

## 2017-05-11 MED ORDER — SODIUM CHLORIDE 0.9% FLUSH
3.0000 mL | Freq: Two times a day (BID) | INTRAVENOUS | Status: DC
  Administered 2017-05-11 – 2017-05-25 (×22): 3 mL via INTRAVENOUS

## 2017-05-11 MED ORDER — PROPOFOL 10 MG/ML IV BOLUS
INTRAVENOUS | Status: AC
  Filled 2017-05-11: qty 40

## 2017-05-11 MED ORDER — LIDOCAINE VISCOUS 2 % MT SOLN
OROMUCOSAL | Status: AC
  Filled 2017-05-11: qty 15

## 2017-05-11 MED ORDER — SODIUM CHLORIDE 0.9% FLUSH
3.0000 mL | INTRAVENOUS | Status: DC | PRN
  Administered 2017-05-20: 3 mL via INTRAVENOUS
  Filled 2017-05-11: qty 3

## 2017-05-11 MED ORDER — LACTATED RINGERS IV SOLN
INTRAVENOUS | Status: DC
  Administered 2017-05-11: 11:00:00 via INTRAVENOUS

## 2017-05-11 MED ORDER — SODIUM CHLORIDE 0.9% FLUSH
INTRAVENOUS | Status: AC
  Filled 2017-05-11: qty 40

## 2017-05-11 MED ORDER — LIDOCAINE VISCOUS 2 % MT SOLN
5.0000 mL | Freq: Once | OROMUCOSAL | Status: AC
Start: 2017-05-11 — End: 2017-05-11
  Administered 2017-05-11: 5 mL via OROMUCOSAL

## 2017-05-11 MED ORDER — MIDAZOLAM HCL 2 MG/2ML IJ SOLN
INTRAMUSCULAR | Status: AC
  Filled 2017-05-11: qty 2

## 2017-05-11 MED ORDER — PROPOFOL 10 MG/ML IV BOLUS
INTRAVENOUS | Status: DC | PRN
  Administered 2017-05-11 (×2): 10 mg via INTRAVENOUS

## 2017-05-11 MED ORDER — METOPROLOL TARTRATE 25 MG PO TABS
25.0000 mg | ORAL_TABLET | Freq: Two times a day (BID) | ORAL | Status: DC
  Administered 2017-05-11 – 2017-05-12 (×4): 25 mg via ORAL
  Filled 2017-05-11 (×5): qty 1

## 2017-05-11 MED ORDER — FENTANYL CITRATE (PF) 100 MCG/2ML IJ SOLN
INTRAMUSCULAR | Status: DC | PRN
  Administered 2017-05-11: 25 ug via INTRAVENOUS

## 2017-05-11 MED ORDER — FENTANYL CITRATE (PF) 100 MCG/2ML IJ SOLN
INTRAMUSCULAR | Status: AC
  Filled 2017-05-11: qty 2

## 2017-05-11 MED ORDER — FENTANYL CITRATE (PF) 100 MCG/2ML IJ SOLN
25.0000 ug | Freq: Once | INTRAMUSCULAR | Status: AC
  Administered 2017-05-11: 25 ug via INTRAVENOUS

## 2017-05-11 MED ORDER — SODIUM CHLORIDE 0.9 % IV SOLN: 250.0000 mL | INTRAVENOUS | Status: DC

## 2017-05-11 NOTE — Anesthesia Preprocedure Evaluation (Signed)
Anesthesia Evaluation  Patient identified by MRN, date of birth, ID band Patient awake    Reviewed: Allergy & Precautions, H&P , NPO status , Patient's Chart, lab work & pertinent test results  Airway Mallampati: I  TM Distance: <3 FB Neck ROM: Full    Dental   Pulmonary shortness of breath, COPD,    breath sounds clear to auscultation       Cardiovascular hypertension, Pt. on medications + angina + CAD, + Peripheral Vascular Disease and +CHF  + dysrhythmias Atrial Fibrillation  Rhythm:Irregular Rate:Tachycardia  PDA   Neuro/Psych PSYCHIATRIC DISORDERS Depression    GI/Hepatic dysphagia   Endo/Other  diabetes, Poorly Controlled, Type 2Hypothyroidism   Renal/GU      Musculoskeletal   Abdominal   Peds  Hematology  (+) anemia ,   Anesthesia Other Findings   Reproductive/Obstetrics                             Anesthesia Physical Anesthesia Plan  ASA: IV and emergent  Anesthesia Plan: MAC   Post-op Pain Management:    Induction: Intravenous  PONV Risk Score and Plan:   Airway Management Planned: Simple Face Mask  Additional Equipment:   Intra-op Plan:   Post-operative Plan:   Informed Consent: I have reviewed the patients History and Physical, chart, labs and discussed the procedure including the risks, benefits and alternatives for the proposed anesthesia with the patient or authorized representative who has indicated his/her understanding and acceptance.     Plan Discussed with:   Anesthesia Plan Comments:         Anesthesia Quick Evaluation

## 2017-05-11 NOTE — Progress Notes (Signed)
Electrical Cardioversion Procedure Note Ellen Woods [id number] 01/27/1941  Procedure: Electrical Cardioversion Indications:  Atrial Fibrillation  Procedure Details Consent: Risks of procedure as well as the alternatives and risks of each were explained to the (patient/caregiver).  Consent for procedure obtained. Time Out: Verified patient identification, verified procedure, site/side was marked, verified correct patient position, special equipment/implants available, medications/allergies/relevent history reviewed, required imaging and test results available.  Performed  Patient placed on cardiac monitor, pulse oximetry, supplemental oxygen as necessary.  Sedation given: propofol Pacer pads placed anterior and posterior chest.  Cardioverted 1 time(Woods).  Cardioverted at 120J.  Evaluation Findings: Post procedure EKG shows: NSR Complications: None Patient did tolerate procedure well.   Ellen Woods, Ellen Woods 05/11/2017, 12:25 PM

## 2017-05-11 NOTE — Anesthesia Postprocedure Evaluation (Signed)
Anesthesia Post Note  Patient: MIRHA BRUCATO  Procedure(s) Performed: CARDIOVERSION (N/A ) TRANSESOPHAGEAL ECHOCARDIOGRAM (TEE) WITH PROPOFOL (N/A )  Patient location during evaluation: PACU Anesthesia Type: MAC Level of consciousness: awake and alert and oriented Pain management: pain level controlled Vital Signs Assessment: post-procedure vital signs reviewed and stable Respiratory status: spontaneous breathing Cardiovascular status: blood pressure returned to baseline Postop Assessment: no apparent nausea or vomiting Anesthetic complications: no     Last Vitals:  Vitals:   05/11/17 1230 05/11/17 1236  BP: 114/64 (!) 112/44  Pulse: 86 88  Resp: (!) 25 (!) 24  Temp:    SpO2: 90% 90%    Last Pain:  Vitals:   05/11/17 1236  TempSrc:   PainSc: 0-No pain                 Dekisha Mesmer

## 2017-05-11 NOTE — Progress Notes (Signed)
Progress Note  Patient Name: Ellen Woods Date of Encounter: 05/11/2017  Primary Cardiologist: Dr. Prentice DockerSuresh Koneswaran (last seen August 2018)  Subjective   Complains of palpitations. No chest pain or shortness of breath at rest. No nausea or abdominal pain.  Inpatient Medications    Scheduled Meds: . DULoxetine  30 mg Oral Daily  . enoxaparin (LOVENOX) injection  60 mg Subcutaneous Q24H  . feeding supplement (GLUCERNA SHAKE)  237 mL Oral TID BM  . insulin aspart  0-9 Units Subcutaneous TID WC  . insulin aspart  2 Units Subcutaneous TID WC  . insulin glargine  6 Units Subcutaneous Daily  . levothyroxine  50 mcg Oral QAC breakfast  . linaclotide  290 mcg Oral QAC breakfast  . metoCLOPramide (REGLAN) injection  10 mg Intravenous TID AC  . metoprolol tartrate  12.5 mg Oral BID  . pantoprazole (PROTONIX) IV  40 mg Intravenous BID AC  . rosuvastatin  5 mg Oral Daily   Continuous Infusions: . amiodarone 30 mg/hr (05/11/17 0824)   PRN Meds: acetaminophen, ALPRAZolam, alum & mag hydroxide-simeth, HYDROcodone-acetaminophen, promethazine, sodium chloride   Vital Signs    Vitals:   05/11/17 0500 05/11/17 0600 05/11/17 0700 05/11/17 0800  BP: 107/68 116/74 131/80   Pulse: (!) 137 (!) 138 (!) 136   Resp: (!) 23 (!) 30 (!) 25   Temp:    98.5 F (36.9 C)  TempSrc:    Axillary  SpO2: 95% 96% 96%   Weight:      Height:        Intake/Output Summary (Last 24 hours) at 05/11/2017 0933 Last data filed at 05/11/2017 0800 Gross per 24 hour  Intake 557.3 ml  Output 300 ml  Net 257.3 ml   Filed Weights   05/07/17 0500 05/08/17 0500 05/09/17 0400  Weight: 124 lb 1.9 oz (56.3 kg) 123 lb 7.3 oz (56 kg) 122 lb 9.2 oz (55.6 kg)    Telemetry    Atrial fibrillation with RVR. Personally reviewed.  Physical Exam   GEN:  Elderly woman. No acute distress.   Neck: No JVD. Cardiac:  Irregularly irregular, no gallop.  Respiratory: Nonlabored. Clear to auscultation bilaterally. GI:  Soft, nontender, bowel sounds present. MS: No edema; No deformity. Neuro:  Nonfocal. Psych: Alert and oriented x 3. Normal affect.  Labs    Chemistry Recent Labs  Lab 05/09/17 0438 05/10/17 0601 05/11/17 0615  NA 145 138 138  K 3.5 4.6 3.9  CL 105 101 100*  CO2 29 27 27   GLUCOSE 80 371* 166*  BUN 24* 33* 34*  CREATININE 1.26* 1.48* 1.54*  CALCIUM 8.7* 8.2* 8.2*  PROT 7.1 5.9* 6.0*  ALBUMIN 3.6 2.9* 2.9*  AST 31 28 27   ALT 29 27 24   ALKPHOS 80 72 65  BILITOT 0.8 0.5 0.5  GFRNONAA 40* 33* 32*  GFRAA 47* 38* 37*  ANIONGAP 11 10 11      Hematology Recent Labs  Lab 05/08/17 0421 05/09/17 0438 05/10/17 0601  WBC 7.2 10.1 7.9  RBC 3.43* 4.00 3.34*  HGB 10.8* 12.3 10.7*  HCT 34.5* 40.7 33.8*  MCV 100.6* 101.8* 101.2*  MCH 31.5 30.8 32.0  MCHC 31.3 30.2 31.7  RDW 13.6 13.4 13.5  PLT 218 262 211    Cardiac Enzymes Recent Labs  Lab 05/05/17 1322 05/05/17 2141 05/06/17 0325 05/06/17 1039  TROPONINI <0.03 <0.03 <0.03 <0.03   No results for input(s): TROPIPOC in the last 168 hours.   Radiology    Dg  Chest Port 1 View  Result Date: 05/09/2017 CLINICAL DATA:  Injured fibrillation, cough, fever EXAM: PORTABLE CHEST 1 VIEW COMPARISON:  05/05/2017 FINDINGS: Bilateral diffuse mild interstitial thickening, right greater than left. Patchy right lung alveolar airspace opacities. Small right pleural effusion. Small left pleural effusion. No pneumothorax. Stable cardiomegaly. No acute osseous abnormality. IMPRESSION: Persistent CHF without significant interval change compared with 05/05/2017. Electronically Signed   By: Elige Ko   On: 05/09/2017 14:00    Cardiac Studies   Echocardiogram 05/06/2017: Study Conclusions  - Left ventricle: The cavity size was normal. Wall thickness was   normal. Systolic function was normal. The estimated ejection   fraction was in the range of 60% to 65%. Wall motion was normal;   there were no regional wall motion abnormalities. -  Aortic valve: Mildly calcified annulus. Trileaflet; normal   thickness leaflets. Valve area (VTI): 1.87 cm^2. - Mitral valve: There was mild regurgitation. - Left atrium: The atrium was moderately dilated. - Right atrium: The atrium was mildly dilated. - Atrial septum: No defect or patent foramen ovale was identified. - Pulmonary arteries: Systolic pressure was mildly to moderately   increased. PA peak pressure: 40 mm Hg (S).  Patient Profile     77 y.o. female with a history of nonobstructive CAD, hypertension, hyperlipidemia, type 2 diabetes mellitus, and COPD, currently being managed for newly documented atrial fibrillation with RVR, CHADSVASC score of 5.  Assessment & Plan    1. Newly documented, persistent atrial fibrillation with RVR, CHADSVASC score of 5. She is on Coumadin per pharmacy with Lovenox bridge, recent INR 2.3 and 1.8. Heart rate control has not been achieved despite IV amiodarone and oral Lopressor, blood pressure has limited medication titration as well. Dr. [name] mentioned TEE cardioversion for today (rounding note from Friday) if heart rate control remained an issue. She is nothing by mouth, but nothing else has been scheduled.  2. Nonobstructive CAD based on previous workup in 2012 with low risk stress testing in 2018. Cardiac markers argue against ACS.  3. Hyperlipidemia, continued on Crestor.  4. History of hypertension, recent blood pressures low normal to low, limiting titration of medical therapy for heart rate control.  Keep nothing by mouth for now. We will check with anesthesia service and echo tech to see if patient can be scheduled for TEE guided cardioversion today, possibly this afternoon. Continue IV amiodarone, hold Lopressor for now. Continue Lovenox bridging with subtherapeutic INR on Coumadin. I discussed this with the patient.  Signed, Nona Dell, MD  05/11/2017, 9:33 AM

## 2017-05-11 NOTE — Transfer of Care (Signed)
Immediate Anesthesia Transfer of Care Note  Patient: Ellen Woods  Procedure(s) Performed: CARDIOVERSION (N/A ) TRANSESOPHAGEAL ECHOCARDIOGRAM (TEE) WITH PROPOFOL (N/A )  Patient Location: PACU  Anesthesia Type:MAC  Level of Consciousness: awake and alert   Airway & Oxygen Therapy: Patient Spontanous Breathing and Patient connected to nasal cannula oxygen  Post-op Assessment: Report given to RN  Post vital signs: Reviewed and stable  Last Vitals:  Vitals:   05/11/17 0800 05/11/17 1114  BP:    Pulse:  (!) 164  Resp:    Temp: 36.9 C 37.1 C  SpO2:      Last Pain:  Vitals:   05/11/17 1114  TempSrc: Oral  PainSc:       Patients Stated Pain Goal: 2 (37/09/64 3838)  Complications: No apparent anesthesia complications

## 2017-05-11 NOTE — Progress Notes (Signed)
PROGRESS NOTE  Ellen Woods  ZOX:[id number] DOB: 08/03/1940 DOA: 05/05/2017 PCP: Dettinger, Elige RadonJoshua A, MD   Brief Narrative:  77 year old woman admitted from home on 1/1 due to palpitations.  Found to be in new onset A. fib with RVR.  Admission was requested.  Assessment & Plan:   Principal Problem:   Atrial fibrillation with RVR (HCC) Active Problems:   Mixed hyperlipidemia   Essential hypertension, benign   Chronic diastolic heart failure (HCC)   Diabetes type 2, uncontrolled (HCC)   Hypothyroidism   Uncontrolled type 2 diabetes mellitus with hyperglycemia (HCC)   Atrial fibrillation, new onset (HCC)   Congestive heart failure (HCC)   Intractable cyclical vomiting with nausea   Dysphagia   Esophageal pain   Hyperglycemia   Hypoglycemia  Atrial fibrillation with rapid ventricular response -Initially Started back on IV Cardizem drip but not able to control rate, cardiology consulted for assistance with rate control and started on IV amiodarone, HR still uncontrolled this morning.  Pt not symptomatic. HR remains uncontrolled, cardiology added lopressor 12.5 mg BID last night.  Per cardiology, would continue IV amiodarone until she converts, if she does not then they likely will need to do a TEE cardioversion 1/7.  NPO today for TEE.    -Her CHADS VASC score is at least 5 and as such has been started on Coumadin with a Lovenox bridge.  lovenox bridge in process.  Following with pharmacist.   Intractable nausea and vomiting - Symptoms persist but slightly improved, she is not medically stable enough for EGD, continue medical treatments with IV reglan and nausea medications.  GI is following.     CHF, unspecified type -Suspect this is diastolic,  Echo with normal LV size and function EF 60-65%.  Unable to assess diastolic function due to rapid Afib.  -Pt diuresed 1.4L since admission.        Diabetes Mellitus type 2 insulin requiring CBG   Recent Labs    05/10/17 2056  05/11/17 0251 05/11/17 0741  GLUCAP 77 149* 159*   Hypoglycemia DC lantus for now.  Continue sliding scale coverage if needed Remains on full liquid diet but not eating.  Ordered novolog 2 units TIDAC if eats 50% or more of meal.  DC HS coverage.  Add 3 am blood sugar testing.   Acute Kidney Injury on CKD stage 2 - likely some prerenal azotemia from GI losses and poor oral intake.   Hypothyroidism -Continue Synthroid, TSH was WNL 2 months ago  Hyperlipidemia -Continue Crestor  Depression and anxiety -Continue Cymbalta and Xanax  Hypertension -Blood pressure stable, following.   DVT prophylaxis: SCDs Code Status: Full code Family Communication:  Disposition Plan: Not medically ready  Consultants:   Cardiology  Procedures:   2D echo Study Conclusions  - Left ventricle: The cavity size was normal. Wall thickness was  normal. Systolic function was normal. The estimated ejection   fraction was in the range of 60% to 65%. Wall motion was normal;  there were no regional wall motion abnormalities. - Aortic valve: Mildly calcified annulus. Trileaflet; normal thickness leaflets. Valve area (VTI): 1.87 cm^2. - Mitral valve: There was mild regurgitation. - Left atrium: The atrium was moderately dilated. - Right atrium: The atrium was mildly dilated. - Atrial septum: No defect or patent foramen ovale was identified. - Pulmonary arteries: Systolic pressure was mildly to moderately increased. PA peak pressure: 40 mm Hg (S).  Antimicrobials:  Anti-infectives (From admission, onward)   None  Subjective: Pt says that she doesn't feel as good today as she did yesterday, nausea is a little better.  She is NPO now for a procedure.       Objective: Vitals:   05/11/17 0500 05/11/17 0600 05/11/17 0700 05/11/17 0800  BP: 107/68 116/74 131/80   Pulse: (!) 137 (!) 138 (!) 136   Resp: (!) 23 (!) 30 (!) 25   Temp:    98.5 F (36.9 C)  TempSrc:    Axillary  SpO2: 95% 96% 96%    Weight:      Height:        Intake/Output Summary (Last 24 hours) at 05/11/2017 0906 Last data filed at 05/11/2017 0800 Gross per 24 hour  Intake 557.3 ml  Output 300 ml  Net 257.3 ml   Filed Weights   05/07/17 0500 05/08/17 0500 05/09/17 0400  Weight: 56.3 kg (124 lb 1.9 oz) 56 kg (123 lb 7.3 oz) 55.6 kg (122 lb 9.2 oz)    Examination:  General exam: Alert, awake, oriented x 3 Respiratory system: BBS CTA Cardiovascular system: Tachycardic, irregular Gastrointestinal system: Abdomen is nondistended, soft and nontender. No organomegaly or masses felt. Normal bowel sounds heard. Central nervous system: Alert and oriented. No focal neurological deficits. Extremities: no pretibial edema, positive pedal pulses Skin: No rashes, lesions or ulcers Psychiatry: Judgement and insight appear normal. Mood & affect appropriate.   Data Reviewed: I have personally reviewed following labs and imaging studies  CBC: Recent Labs  Lab 05/05/17 1322 05/06/17 0325 05/08/17 0421 05/09/17 0438 05/10/17 0601  WBC 9.0 6.4 7.2 10.1 7.9  NEUTROABS 7.6  --  5.8 9.0* 7.0  HGB 12.0 11.2* 10.8* 12.3 10.7*  HCT 38.9 34.9* 34.5* 40.7 33.8*  MCV 101.0* 99.4 100.6* 101.8* 101.2*  PLT 226 190 218 262 211   Basic Metabolic Panel: Recent Labs  Lab 05/06/17 0325 05/08/17 0421 05/09/17 0438 05/10/17 0601 05/11/17 0615  NA 142 141 145 138 138  K 4.1 3.5 3.5 4.6 3.9  CL 108 104 105 101 100*  CO2 24 24 29 27 27   GLUCOSE 127* 81 80 371* 166*  BUN 43* 35* 24* 33* 34*  CREATININE 1.19* 1.42* 1.26* 1.48* 1.54*  CALCIUM 8.7* 8.0* 8.7* 8.2* 8.2*  MG  --  2.0 2.1 2.1  --    GFR: Estimated Creatinine Clearance: 23.6 mL/min (A) (by C-G formula based on SCr of 1.54 mg/dL (H)). Liver Function Tests: Recent Labs  Lab 05/08/17 0421 05/09/17 0438 05/10/17 0601 05/11/17 0615  AST 20 31 28 27   ALT 30 29 27 24   ALKPHOS 69 80 72 65  BILITOT 0.8 0.8 0.5 0.5  PROT 5.8* 7.1 5.9* 6.0*  ALBUMIN 3.1* 3.6 2.9*  2.9*   No results for input(s): LIPASE, AMYLASE in the last 168 hours. No results for input(s): AMMONIA in the last 168 hours. Coagulation Profile: Recent Labs  Lab 05/07/17 0643 05/08/17 0421 05/09/17 0806 05/10/17 0601 05/11/17 0615  INR 1.64 2.67 2.35 2.33 1.83   Cardiac Enzymes: Recent Labs  Lab 05/05/17 1322 05/05/17 2141 05/06/17 0325 05/06/17 1039  TROPONINI <0.03 <0.03 <0.03 <0.03   BNP (last 3 results) No results for input(s): PROBNP in the last 8760 hours. HbA1C: No results for input(s): HGBA1C in the last 72 hours. CBG: Recent Labs  Lab 05/10/17 1132 05/10/17 1620 05/10/17 2056 05/11/17 0251 05/11/17 0741  GLUCAP 345* 136* 77 149* 159*   Lipid Profile: No results for input(s): CHOL, HDL, LDLCALC, TRIG, CHOLHDL, LDLDIRECT in  the last 72 hours. Thyroid Function Tests: Recent Labs    05/09/17 0806  TSH 1.859   Anemia Panel: No results for input(s): VITAMINB12, FOLATE, FERRITIN, TIBC, IRON, RETICCTPCT in the last 72 hours. Urine analysis:    Component Value Date/Time   COLORURINE YELLOW 03/14/2014 1227   APPEARANCEUR CLEAR 03/14/2014 1227   LABSPEC 1.015 03/14/2014 1227   PHURINE 5.0 03/14/2014 1227   GLUCOSEU 100 (A) 03/14/2014 1227   HGBUR NEGATIVE 03/14/2014 1227   BILIRUBINUR neg 06/13/2015 1710   KETONESUR NEGATIVE 03/14/2014 1227   PROTEINUR neg 06/13/2015 1710   PROTEINUR NEGATIVE 03/14/2014 1227   UROBILINOGEN negative 06/13/2015 1710   UROBILINOGEN 0.2 03/14/2014 1227   NITRITE neg 06/13/2015 1710   NITRITE NEGATIVE 03/14/2014 1227   LEUKOCYTESUR Negative 06/13/2015 1710    Recent Results (from the past 240 hour(s))  MRSA PCR Screening     Status: None   Collection Time: 05/05/17  9:20 PM  Result Value Ref Range Status   MRSA by PCR NEGATIVE NEGATIVE Final    Comment:        The GeneXpert MRSA Assay (FDA approved for NASAL specimens only), is one component of a comprehensive MRSA colonization surveillance program. It is  not intended to diagnose MRSA infection nor to guide or monitor treatment for MRSA infections.     Radiology Studies: Dg Chest Port 1 View  Result Date: 05/09/2017 CLINICAL DATA:  Injured fibrillation, cough, fever EXAM: PORTABLE CHEST 1 VIEW COMPARISON:  05/05/2017 FINDINGS: Bilateral diffuse mild interstitial thickening, right greater than left. Patchy right lung alveolar airspace opacities. Small right pleural effusion. Small left pleural effusion. No pneumothorax. Stable cardiomegaly. No acute osseous abnormality. IMPRESSION: Persistent CHF without significant interval change compared with 05/05/2017. Electronically Signed   By: Elige Ko   On: 05/09/2017 14:00   Scheduled Meds: . DULoxetine  30 mg Oral Daily  . enoxaparin (LOVENOX) injection  60 mg Subcutaneous Q24H  . feeding supplement (GLUCERNA SHAKE)  237 mL Oral TID BM  . insulin aspart  0-9 Units Subcutaneous TID WC  . insulin aspart  2 Units Subcutaneous TID WC  . insulin glargine  6 Units Subcutaneous Daily  . levothyroxine  50 mcg Oral QAC breakfast  . linaclotide  290 mcg Oral QAC breakfast  . metoCLOPramide (REGLAN) injection  10 mg Intravenous TID AC  . metoprolol tartrate  12.5 mg Oral BID  . pantoprazole (PROTONIX) IV  40 mg Intravenous BID AC  . rosuvastatin  5 mg Oral Daily   Continuous Infusions: . amiodarone 30 mg/hr (05/11/17 0824)    LOS: 6 days   Critical Care Time spent: 31 minutes. Greater than 50% of this time was spent in direct contact with the patient coordinating care.  Standley Dakins, MD [facility] Pager [phone]  If 7PM-7AM, please contact night-coverage [email or web address] Password [email or web address] 05/11/2017, 9:06 AM

## 2017-05-11 NOTE — Progress Notes (Signed)
*  PRELIMINARY RESULTS* Echocardiogram Echocardiogram Transesophageal has been performed.  Jeryl Columbialliott, Ethelmae Ringel 05/11/2017, 1:29 PM

## 2017-05-11 NOTE — H&P (View-Only) (Signed)
Progress Note  Patient Name: Ellen HaverGlenda J Faria Date of Encounter: 05/11/2017  Primary Cardiologist: Dr. Prentice DockerSuresh Koneswaran (last seen August 2018)  Subjective   Complains of palpitations. No chest pain or shortness of breath at rest. No nausea or abdominal pain.  Inpatient Medications    Scheduled Meds: . DULoxetine  30 mg Oral Daily  . enoxaparin (LOVENOX) injection  60 mg Subcutaneous Q24H  . feeding supplement (GLUCERNA SHAKE)  237 mL Oral TID BM  . insulin aspart  0-9 Units Subcutaneous TID WC  . insulin aspart  2 Units Subcutaneous TID WC  . insulin glargine  6 Units Subcutaneous Daily  . levothyroxine  50 mcg Oral QAC breakfast  . linaclotide  290 mcg Oral QAC breakfast  . metoCLOPramide (REGLAN) injection  10 mg Intravenous TID AC  . metoprolol tartrate  12.5 mg Oral BID  . pantoprazole (PROTONIX) IV  40 mg Intravenous BID AC  . rosuvastatin  5 mg Oral Daily   Continuous Infusions: . amiodarone 30 mg/hr (05/11/17 0824)   PRN Meds: acetaminophen, ALPRAZolam, alum & mag hydroxide-simeth, HYDROcodone-acetaminophen, promethazine, sodium chloride   Vital Signs    Vitals:   05/11/17 0500 05/11/17 0600 05/11/17 0700 05/11/17 0800  BP: 107/68 116/74 131/80   Pulse: (!) 137 (!) 138 (!) 136   Resp: (!) 23 (!) 30 (!) 25   Temp:    98.5 F (36.9 C)  TempSrc:    Axillary  SpO2: 95% 96% 96%   Weight:      Height:        Intake/Output Summary (Last 24 hours) at 05/11/2017 0933 Last data filed at 05/11/2017 0800 Gross per 24 hour  Intake 557.3 ml  Output 300 ml  Net 257.3 ml   Filed Weights   05/07/17 0500 05/08/17 0500 05/09/17 0400  Weight: 124 lb 1.9 oz (56.3 kg) 123 lb 7.3 oz (56 kg) 122 lb 9.2 oz (55.6 kg)    Telemetry    Atrial fibrillation with RVR. Personally reviewed.  Physical Exam   GEN:  Elderly woman. No acute distress.   Neck: No JVD. Cardiac:  Irregularly irregular, no gallop.  Respiratory: Nonlabored. Clear to auscultation bilaterally. GI:  Soft, nontender, bowel sounds present. MS: No edema; No deformity. Neuro:  Nonfocal. Psych: Alert and oriented x 3. Normal affect.  Labs    Chemistry Recent Labs  Lab 05/09/17 0438 05/10/17 0601 05/11/17 0615  NA 145 138 138  K 3.5 4.6 3.9  CL 105 101 100*  CO2 29 27 27   GLUCOSE 80 371* 166*  BUN 24* 33* 34*  CREATININE 1.26* 1.48* 1.54*  CALCIUM 8.7* 8.2* 8.2*  PROT 7.1 5.9* 6.0*  ALBUMIN 3.6 2.9* 2.9*  AST 31 28 27   ALT 29 27 24   ALKPHOS 80 72 65  BILITOT 0.8 0.5 0.5  GFRNONAA 40* 33* 32*  GFRAA 47* 38* 37*  ANIONGAP 11 10 11      Hematology Recent Labs  Lab 05/08/17 0421 05/09/17 0438 05/10/17 0601  WBC 7.2 10.1 7.9  RBC 3.43* 4.00 3.34*  HGB 10.8* 12.3 10.7*  HCT 34.5* 40.7 33.8*  MCV 100.6* 101.8* 101.2*  MCH 31.5 30.8 32.0  MCHC 31.3 30.2 31.7  RDW 13.6 13.4 13.5  PLT 218 262 211    Cardiac Enzymes Recent Labs  Lab 05/05/17 1322 05/05/17 2141 05/06/17 0325 05/06/17 1039  TROPONINI <0.03 <0.03 <0.03 <0.03   No results for input(s): TROPIPOC in the last 168 hours.   Radiology    Dg  Chest Port 1 View  Result Date: 05/09/2017 CLINICAL DATA:  Injured fibrillation, cough, fever EXAM: PORTABLE CHEST 1 VIEW COMPARISON:  05/05/2017 FINDINGS: Bilateral diffuse mild interstitial thickening, right greater than left. Patchy right lung alveolar airspace opacities. Small right pleural effusion. Small left pleural effusion. No pneumothorax. Stable cardiomegaly. No acute osseous abnormality. IMPRESSION: Persistent CHF without significant interval change compared with 05/05/2017. Electronically Signed   By: Elige Ko   On: 05/09/2017 14:00    Cardiac Studies   Echocardiogram 05/06/2017: Study Conclusions  - Left ventricle: The cavity size was normal. Wall thickness was   normal. Systolic function was normal. The estimated ejection   fraction was in the range of 60% to 65%. Wall motion was normal;   there were no regional wall motion abnormalities. -  Aortic valve: Mildly calcified annulus. Trileaflet; normal   thickness leaflets. Valve area (VTI): 1.87 cm^2. - Mitral valve: There was mild regurgitation. - Left atrium: The atrium was moderately dilated. - Right atrium: The atrium was mildly dilated. - Atrial septum: No defect or patent foramen ovale was identified. - Pulmonary arteries: Systolic pressure was mildly to moderately   increased. PA peak pressure: 40 mm Hg (S).  Patient Profile     77 y.o. female with a history of nonobstructive CAD, hypertension, hyperlipidemia, type 2 diabetes mellitus, and COPD, currently being managed for newly documented atrial fibrillation with RVR, CHADSVASC score of 5.  Assessment & Plan    1. Newly documented, persistent atrial fibrillation with RVR, CHADSVASC score of 5. She is on Coumadin per pharmacy with Lovenox bridge, recent INR 2.3 and 1.8. Heart rate control has not been achieved despite IV amiodarone and oral Lopressor, blood pressure has limited medication titration as well. Dr. [name] mentioned TEE cardioversion for today (rounding note from Friday) if heart rate control remained an issue. She is nothing by mouth, but nothing else has been scheduled.  2. Nonobstructive CAD based on previous workup in 2012 with low risk stress testing in 2018. Cardiac markers argue against ACS.  3. Hyperlipidemia, continued on Crestor.  4. History of hypertension, recent blood pressures low normal to low, limiting titration of medical therapy for heart rate control.  Keep nothing by mouth for now. We will check with anesthesia service and echo tech to see if patient can be scheduled for TEE guided cardioversion today, possibly this afternoon. Continue IV amiodarone, hold Lopressor for now. Continue Lovenox bridging with subtherapeutic INR on Coumadin. I discussed this with the patient.  Signed, Nona Dell, MD  05/11/2017, 9:33 AM

## 2017-05-11 NOTE — CV Procedure (Signed)
Transesophageal echocardiogram guided direct-current cardioversion  Indication: Persistent atrial fibrillation with RVR  Description of procedure: After informed consent was obtained, patient was taken to the procedure suite where a timeout was performed. Conscious/deep sedation was provided by the anesthesia service with use of propofol. Oropharynx with anesthetized with viscous lidocaine. Transesophageal echocardiogram was first performed with results as follows:  LVEF 55-60%. Moderate to severe left atrial enlargement and moderate right atrial enlargement. No obvious thrombus noted within left or right atrial appendages. Emptying velocity of left atrial appendage was severely reduced. Trivial mitral regurgitation and mild tricuspid regurgitation. Minor luminal irregularities in the descending aorta. Transgastric images were not obtained, study was truncated due to patient having somewhat tenuous pulmonary status with intermittent hypoxia.  Following transesophageal echocardiogram, attention was turned to cardioversion. Anterior and posterior pads were placed with sandbag on anterior pad. Using a biphasic defibrillator, a single synchronized shock at 120 J was delivered with successful restoration of sinus rhythm. Patient remained hemodynamically stable throughout and sinus rhythm was confirmed by postprocedure ECG. Other than intermittent hypoxia and somewhat tenuous respiratory status that improved by end of procedure, patient tolerated the procedure well without immediate complications.  Jonelle SidleSamuel G. McDowell, M.D., F.A.C.C.

## 2017-05-11 NOTE — Progress Notes (Signed)
    Subjective: N/V "all day yesterday" but none this morning thus far. NPO for possible cardioversion. Heart rate 140-150 when seen today. No abdominal pain. States she does have dysphagia with solids.   Objective: Vital signs in last 24 hours: Temp:  [98.5 F (36.9 C)-99.1 F (37.3 C)] 99.1 F (37.3 C) (01/07 0400) Pulse Rate:  [128-171] 136 (01/07 0700) Resp:  [19-30] 25 (01/07 0700) BP: (85-159)/(62-112) 131/80 (01/07 0700) SpO2:  [90 %-97 %] 96 % (01/07 0700) Last BM Date: 05/04/17 General:   Alert and oriented, pleasant Head:  Normocephalic and atraumatic. Eyes:  No icterus, sclera clear. Conjuctiva pink.  Mouth:  Without lesions, mucosa pink and moist. Abdomen:  Bowel sounds present, soft, non-tender, non-distended. Neurologic:  Alert and  oriented x4 Psych:  Alert and cooperative. Normal mood and affect.  Intake/Output from previous day: 01/06 0701 - 01/07 0700 In: 987.3 [P.O.:480; I.V.:507.3] Out: -  Intake/Output this shift: Total I/O In: -  Out: 300 [Urine:300]  Lab Results: Recent Labs    05/09/17 0438 05/10/17 0601  WBC 10.1 7.9  HGB 12.3 10.7*  HCT 40.7 33.8*  PLT 262 211   BMET Recent Labs    05/09/17 0438 05/10/17 0601 05/11/17 0615  NA 145 138 138  K 3.5 4.6 3.9  CL 105 101 100*  CO2 29 27 27   GLUCOSE 80 371* 166*  BUN 24* 33* 34*  CREATININE 1.26* 1.48* 1.54*  CALCIUM 8.7* 8.2* 8.2*   LFT Recent Labs    05/09/17 0438 05/10/17 0601 05/11/17 0615  PROT 7.1 5.9* 6.0*  ALBUMIN 3.6 2.9* 2.9*  AST 31 28 27   ALT 29 27 24   ALKPHOS 80 72 65  BILITOT 0.8 0.5 0.5   PT/INR Recent Labs    05/10/17 0601 05/11/17 0615  LABPROT 25.4* 21.0*  INR 2.33 1.83    Studies/Results: Dg Chest Port 1 View  Result Date: 05/09/2017 CLINICAL DATA:  Injured fibrillation, cough, fever EXAM: PORTABLE CHEST 1 VIEW COMPARISON:  05/05/2017 FINDINGS: Bilateral diffuse mild interstitial thickening, right greater than left. Patchy right lung alveolar  airspace opacities. Small right pleural effusion. Small left pleural effusion. No pneumothorax. Stable cardiomegaly. No acute osseous abnormality. IMPRESSION: Persistent CHF without significant interval change compared with 05/05/2017. Electronically Signed   By: Elige KoHetal  Patel   On: 05/09/2017 14:00    Assessment: 77 year old female admitted with afib with RVR, developing N/V during admission.  Not a candidate for endoscopic evaluation until stabilized from cardiac standpoint. Possible TEE/cardioversion per cardiology if does not convert. Anticipate she will need EGD with dilation in future as inpatient due to dysphagia, N/V, inability to advance diet, once heart rate ideally controlled.  Currently, she is without any N/V, but she has also been NPO since midnight.  Plan: Continue Reglan TID before meals PPI BID Will continue to follow and decide timing of EGD once stable from a cardiac standpoint As of note, she was started on Coumadin with Lovenox bridge on admission, now Coumadin on hold and on Lovenox. Will need to discuss management of anticoagulation with cardiology prior to EGD if deemed necessary prior to discharge.   Gelene MinkAnna W. Jaline Pincock, PhD, ANP-BC [facility]     LOS: 6 days    05/11/2017, 8:26 AM

## 2017-05-11 NOTE — Progress Notes (Signed)
Inpatient Diabetes Program Recommendations  AACE/ADA: New Consensus Statement on Inpatient Glycemic Control (2015)  Target Ranges:  Prepandial:   less than 140 mg/dL      Peak postprandial:   less than 180 mg/dL (1-2 hours)      Critically ill patients:  140 - 180 mg/dL   Results for Ellen Woods, Ellen Woods (MRN [id number]) as of 05/11/2017 08:09  Ref. Range 05/10/2017 01:20 05/10/2017 05:33 05/10/2017 07:51 05/10/2017 11:32 05/10/2017 16:20 05/10/2017 20:56 05/11/2017 02:51 05/11/2017 07:41  Glucose-Capillary Latest Ref Range: 65 - 99 mg/dL 409157 (H) 811337 (H) 914408 (H)  Novolog 11 units 345 (H)  Novolog 11 units  Lantus 6 units @ 15:43 136 (H)  Novolog 3 units 77 149 (H) 159 (H)   Results for Ellen Woods, Ellen Woods (MRN [id number]) as of 05/11/2017 08:09  Ref. Range 05/09/2017 07:50 05/09/2017 11:13 05/09/2017 16:10 05/09/2017 23:37 05/09/2017 23:40 05/10/2017 00:41 05/10/2017 00:53  Glucose-Capillary Latest Ref Range: 65 - 99 mg/dL 79 086257 (H)  Novolog 5 units @ 13:15 386 (H)  Novolog 13 units @ 17:06  Lantus 8 units @ 17:05 37 (LL) 97 28 (LL) 23 (LL)   Review of Glycemic Control  Outpatient Diabetes medications: Lantus 14 units QAM, Novolog 3-5 units TID with meals Current orders for Inpatient glycemic control: Lantus 6 units daily, Novolog 0-9 units TID with meals, Novolog 2 units TID with meals for meal coverage  Inpatient Diabetes Program Recommendations: Insulin - Basal: Patient recieved Lantus 6 units on 05/10/17 and fasting glucose 159 mg/dl this morning. Will continue to follow glucose trends to determine if basal insulin needs to be adjusted. Correction (SSI): If patient remains NPO, please consider changing frequency of CBGs and Novolog to Q4H. If diet will be resumed, please consider adding Novolog 0-5 units QHS. Insulin - Meal Coverage: Noted post prandial glucose is consistently elevated since Novolog meal coverage decreased (due to hypoglycemia). Once diet is resumed, please consider increasing meal coverage to  Novolog 4 units TID with meals if patient eats at least 50% of meals.  Thanks, Orlando PennerMarie Idalis Hoelting, RN, MSN, CDE Diabetes Coordinator Inpatient Diabetes Program [phone] (Team Pager from 8am to 5pm)

## 2017-05-11 NOTE — Interval H&P Note (Signed)
History and Physical Interval Note:  05/11/2017 11:19 AM  Patient presents as discussed above for TEE guided cardioversion of persistent atrial fibrillation with RVR. Informed consent obtained.  Jonelle SidleSamuel G. McDowell, M.D., F.A.C.C.

## 2017-05-12 LAB — CBC
HEMATOCRIT: 36.2 % (ref 36.0–46.0)
Hemoglobin: 11.2 g/dL — ABNORMAL LOW (ref 12.0–15.0)
MCH: 30.9 pg (ref 26.0–34.0)
MCHC: 30.9 g/dL (ref 30.0–36.0)
MCV: 99.7 fL (ref 78.0–100.0)
PLATELETS: 223 10*3/uL (ref 150–400)
RBC: 3.63 MIL/uL — ABNORMAL LOW (ref 3.87–5.11)
RDW: 13.4 % (ref 11.5–15.5)
WBC: 8.3 10*3/uL (ref 4.0–10.5)

## 2017-05-12 LAB — BASIC METABOLIC PANEL
ANION GAP: 14 (ref 5–15)
BUN: 33 mg/dL — ABNORMAL HIGH (ref 6–20)
CALCIUM: 8 mg/dL — AB (ref 8.9–10.3)
CO2: 24 mmol/L (ref 22–32)
Chloride: 99 mmol/L — ABNORMAL LOW (ref 101–111)
Creatinine, Ser: 1.58 mg/dL — ABNORMAL HIGH (ref 0.44–1.00)
GFR calc Af Amer: 36 mL/min — ABNORMAL LOW (ref >60)
GFR calc non Af Amer: 31 mL/min — ABNORMAL LOW (ref >60)
Glucose, Bld: 175 mg/dL — ABNORMAL HIGH (ref 65–99)
POTASSIUM: 3.7 mmol/L (ref 3.5–5.1)
Sodium: 137 mmol/L (ref 135–145)

## 2017-05-12 LAB — GLUCOSE, CAPILLARY
GLUCOSE-CAPILLARY: 202 mg/dL — AB (ref 65–99)
GLUCOSE-CAPILLARY: 177 mg/dL — AB (ref 65–99)
GLUCOSE-CAPILLARY: 65 mg/dL (ref 65–99)
Glucose-Capillary: 106 mg/dL — ABNORMAL HIGH (ref 65–99)
Glucose-Capillary: 111 mg/dL — ABNORMAL HIGH (ref 65–99)
Glucose-Capillary: 143 mg/dL — ABNORMAL HIGH (ref 65–99)
Glucose-Capillary: 145 mg/dL — ABNORMAL HIGH (ref 65–99)

## 2017-05-12 LAB — PROTIME-INR
INR: 1.82
PROTHROMBIN TIME: 20.9 s — AB (ref 11.4–15.2)

## 2017-05-12 MED ORDER — ENOXAPARIN SODIUM 60 MG/0.6ML ~~LOC~~ SOLN: 60.0000 mg | SUBCUTANEOUS | Status: DC

## 2017-05-12 MED ORDER — DEXTROSE 50 % IV SOLN: 25.0000 mL | Freq: Once | INTRAVENOUS | Status: DC

## 2017-05-12 MED ORDER — AMIODARONE HCL 200 MG PO TABS
200.0000 mg | ORAL_TABLET | Freq: Two times a day (BID) | ORAL | Status: DC
  Administered 2017-05-12 – 2017-05-25 (×24): 200 mg via ORAL
  Filled 2017-05-12 (×25): qty 1

## 2017-05-12 NOTE — Addendum Note (Signed)
Addendum  created 05/12/17 1039 by Moshe Salisburyaniel, Ebon Ketchum E, CRNA   Sign clinical note

## 2017-05-12 NOTE — Progress Notes (Signed)
    Subjective: Patient notes she has continued vomiting all night. I discussed with nursing, who states this was "spitting up" small amounts of phlegm and not actual vomiting. Patient declining to eat breakfast but willing to try some ginger ale after we spoke about attempting diet. +flatus. No abdominal pain.   Objective: Vital signs in last 24 hours: Temp:  [98.4 F (36.9 C)-99.9 F (37.7 C)] 99.7 F (37.6 C) (01/08 0400) Pulse Rate:  [80-164] 93 (01/08 0630) Resp:  [18-29] 24 (01/08 0630) BP: (102-166)/(37-129) 158/78 (01/08 0630) SpO2:  [89 %-97 %] 90 % (01/08 0630) Weight:  [123 lb 0.3 oz (55.8 kg)] 123 lb 0.3 oz (55.8 kg) (01/08 0500) Last BM Date: 05/05/17(per patient) General:   Alert and oriented, flat affect  Head:  Normocephalic and atraumatic. Abdomen:  Bowel sounds hypoactive but present, soft, full but not distended.  Neurologic:  Alert and  oriented x4 Psych:  Alert and cooperative. Normal mood and affect.  Intake/Output from previous day: 01/07 0701 - 01/08 0700 In: 794.4 [I.V.:794.4] Out: 900 [Urine:900] Intake/Output this shift: No intake/output data recorded.  Lab Results: Recent Labs    05/10/17 0601 05/12/17 0418  WBC 7.9 8.3  HGB 10.7* 11.2*  HCT 33.8* 36.2  PLT 211 223   BMET Recent Labs    05/10/17 0601 05/11/17 0615 05/12/17 0418  NA 138 138 137  K 4.6 3.9 3.7  CL 101 100* 99*  CO2 27 27 24   GLUCOSE 371* 166* 175*  BUN 33* 34* 33*  CREATININE 1.48* 1.54* 1.58*  CALCIUM 8.2* 8.2* 8.0*   LFT Recent Labs    05/10/17 0601 05/11/17 0615  PROT 5.9* 6.0*  ALBUMIN 2.9* 2.9*  AST 28 27  ALT 27 24  ALKPHOS 72 65  BILITOT 0.5 0.5   PT/INR Recent Labs    05/11/17 0615 05/12/17 0418  LABPROT 21.0* 20.9*  INR 1.83 1.82    Assessment: 77 year old female admitted with afib RVR, developing N/V during admission. TEE with cardioversion 1/7 successful. Patient did have intermittent hypoxia during procedure. Today she notes persistent  vomiting overnight, but nursing states she was "spitting up" phlegm. She has been hesitant to attempt eating. Discussed sipping on clear liquids this morning and attempting lunch. If she is not able to advance her diet, will need to consider endoscopic evaluation with Propofol. She will be changed from amiodarone infusion to oral amiodarone today. Hopefully, with supportive measures and medication adjustments, she will be able to improve symptomatically and advance her diet.    Plan: Continue Reglan scheduled Continue PPI BID Phenergan prn Dysphagia 3 diet Will follow with you   Gelene MinkAnna W. Adalyne Lovick, PhD, ANP-BC [facility]     LOS: 7 days    05/12/2017, 7:53 AM

## 2017-05-12 NOTE — Progress Notes (Signed)
Progress Note  Patient Name: Ellen Woods Date of Encounter: 05/12/2017  Primary Cardiologist: Dr. Prentice Docker  Subjective   States that she has had nausea with intermittent emesis, no frank abdominal pain or chest pain. No palpitations.  Inpatient Medications    Scheduled Meds: . DULoxetine  30 mg Oral Daily  . enoxaparin (LOVENOX) injection  60 mg Subcutaneous Q24H  . feeding supplement (GLUCERNA SHAKE)  237 mL Oral TID BM  . insulin aspart  0-9 Units Subcutaneous TID WC  . insulin aspart  2 Units Subcutaneous TID WC  . insulin glargine  6 Units Subcutaneous Daily  . levothyroxine  50 mcg Oral QAC breakfast  . linaclotide  290 mcg Oral QAC breakfast  . metoCLOPramide (REGLAN) injection  10 mg Intravenous TID AC  . metoprolol tartrate  25 mg Oral BID  . pantoprazole (PROTONIX) IV  40 mg Intravenous BID AC  . rosuvastatin  5 mg Oral Daily  . sodium chloride flush  3 mL Intravenous Q12H   Continuous Infusions: . sodium chloride    . amiodarone 30 mg/hr (05/11/17 2227)   PRN Meds: acetaminophen, ALPRAZolam, alum & mag hydroxide-simeth, HYDROcodone-acetaminophen, promethazine, sodium chloride, sodium chloride flush   Vital Signs    Vitals:   05/12/17 0530 05/12/17 0600 05/12/17 0630 05/12/17 0813  BP: (!) 153/67 (!) 166/66 (!) 158/78   Pulse: 80 83 93   Resp: (!) 22 20 (!) 24   Temp:    98.9 F (37.2 C)  TempSrc:    Oral  SpO2: 95% 95% 90%   Weight:      Height:        Intake/Output Summary (Last 24 hours) at 05/12/2017 0832 Last data filed at 05/12/2017 0600 Gross per 24 hour  Intake 794.4 ml  Output 600 ml  Net 194.4 ml   Filed Weights   05/08/17 0500 05/09/17 0400 05/12/17 0500  Weight: 123 lb 7.3 oz (56 kg) 122 lb 9.2 oz (55.6 kg) 123 lb 0.3 oz (55.8 kg)    Telemetry    Sinus rhythm with PACs. Personally reviewed.  ECG    Tracing from 05/12/2017 shows sinus rhythm with PACs, leftward axis and poor R wave progression which is old. Personally  reviewed.  Physical Exam   GEN:  Elderly woman. No acute distress.   Neck: No JVD. Cardiac: RRR, no gallop.  Respiratory: Nonlabored. Clear to auscultation bilaterally. GI: Soft, nontender, bowel sounds present. MS: No edema; No deformity. Neuro:  Nonfocal. Psych: Alert and oriented x 3. Normal affect.  Labs    Chemistry Recent Labs  Lab 05/09/17 0438 05/10/17 0601 05/11/17 0615 05/12/17 0418  NA 145 138 138 137  K 3.5 4.6 3.9 3.7  CL 105 101 100* 99*  CO2 29 27 27 24   GLUCOSE 80 371* 166* 175*  BUN 24* 33* 34* 33*  CREATININE 1.26* 1.48* 1.54* 1.58*  CALCIUM 8.7* 8.2* 8.2* 8.0*  PROT 7.1 5.9* 6.0*  --   ALBUMIN 3.6 2.9* 2.9*  --   AST 31 28 27   --   ALT 29 27 24   --   ALKPHOS 80 72 65  --   BILITOT 0.8 0.5 0.5  --   GFRNONAA 40* 33* 32* 31*  GFRAA 47* 38* 37* 36*  ANIONGAP 11 10 11 14      Hematology Recent Labs  Lab 05/09/17 0438 05/10/17 0601 05/12/17 0418  WBC 10.1 7.9 8.3  RBC 4.00 3.34* 3.63*  HGB 12.3 10.7* 11.2*  HCT 40.7  33.8* 36.2  MCV 101.8* 101.2* 99.7  MCH 30.8 32.0 30.9  MCHC 30.2 31.7 30.9  RDW 13.4 13.5 13.4  PLT 262 211 223    Cardiac Enzymes Recent Labs  Lab 05/05/17 1322 05/05/17 2141 05/06/17 0325 05/06/17 1039  TROPONINI <0.03 <0.03 <0.03 <0.03   No results for input(s): TROPIPOC in the last 168 hours.   Radiology    No results found.  Cardiac Studies   Study Conclusions  - Left ventricle: The cavity size was normal. Wall thickness was   normal. Systolic function was normal. The estimated ejection   fraction was in the range of 60% to 65%. Wall motion was normal;   there were no regional wall motion abnormalities. - Aortic valve: Mildly calcified annulus. Trileaflet; normal   thickness leaflets. Valve area (VTI): 1.87 cm^2. - Mitral valve: There was mild regurgitation. - Left atrium: The atrium was moderately dilated. - Right atrium: The atrium was mildly dilated. - Atrial septum: No defect or patent foramen  ovale was identified. - Pulmonary arteries: Systolic pressure was mildly to moderately   increased. PA peak pressure: 40 mm Hg (S).  Patient Profile     77 y.o. female with a history of nonobstructive CAD, hypertension, hyperlipidemia, type 2 diabetes mellitus, and COPD, currently being managed for newly documented atrial fibrillation with RVR, CHADSVASC score of 5.  Assessment & Plan    1. Newly documented, persistent atrial fibrillation with RVR, CHADSVASC score of 5. She is now status post successful TEE guided cardioversion yesterday, maintaining sinus rhythm with intermittent PACs.  2. Recent nausea and emesis. LFTs were normal yesterday. No abdominal pain. Workup per primary team.  3. History of nonobstructive CAD, no active chest pain and recent troponin I levels negative.  4. Hyperlipidemia, continues on Crestor.  5. Essential hypertension, blood pressure trending back up. She was previously on lisinopril and Norvasc as an outpatient. Held initially with low blood pressures.  Continue Coumadin with Lovenox bridge, INR subtherapeutic at 1.82. Plan to stop amiodarone infusion and switch to low-dose oral load - question whether this might help with her nausea any. Continue Lopressor at current dose. Hold off on adding back lisinopril with renal insufficiency. May need to resume Norvasc depending on blood pressure trend. Consider transfer to telemetry.  Signed, Nona DellSamuel Nelia Rogoff, MD  05/12/2017, 8:32 AM

## 2017-05-12 NOTE — Progress Notes (Signed)
1634 Patient's CBG 65, 25mL Dextrose 50% given per protocol. Recheck CBG 111, no s/s of hypoglycemia noted at this time.

## 2017-05-12 NOTE — Care Management (Signed)
CM consulted for [facility]H needs. Patient will need PT eval when appropriate to assess for needs. CM continues to follow.

## 2017-05-12 NOTE — Care Management Note (Signed)
Case Management Note  Patient Details  Name: Melody HaverGlenda J Rochelle MRN: [id number] Date of Birth: 03/04/1941  If discussed at Long Length of Stay Meetings, dates discussed:   05/12/2017 Additional Comments:  Amato Sevillano, Chrystine OilerSharley Diane, RN 05/12/2017, 12:07 PM

## 2017-05-12 NOTE — Anesthesia Postprocedure Evaluation (Signed)
Anesthesia Post Note  Patient: Ellen Woods  Procedure(s) Performed: CARDIOVERSION (N/A ) TRANSESOPHAGEAL ECHOCARDIOGRAM (TEE) WITH PROPOFOL (N/A )  Patient location during evaluation: ICU Anesthesia Type: MAC Level of consciousness: awake and alert and oriented Pain management: pain level controlled Vital Signs Assessment: post-procedure vital signs reviewed and stable Respiratory status: spontaneous breathing Cardiovascular status: blood pressure returned to baseline Postop Assessment: no apparent nausea or vomiting Anesthetic complications: no     Last Vitals:  Vitals:   05/12/17 0630 05/12/17 0813  BP: (!) 158/78   Pulse: 93   Resp: (!) 24   Temp:  37.2 C  SpO2: 90%     Last Pain:  Vitals:   05/12/17 0813  TempSrc: Oral  PainSc:                  Tressie Stalker

## 2017-05-12 NOTE — Progress Notes (Signed)
PROGRESS NOTE  Ellen Woods  ZOX:[id number] DOB: 02-Mar-1941 DOA: 05/05/2017 PCP: Dettinger, Elige Radon, MD   Brief Narrative:  77 year old woman admitted from home on 1/1 due to palpitations.  Found to be in new onset A. fib with RVR.  Admission was requested.  Assessment & Plan:   Principal Problem:   Atrial fibrillation with RVR (HCC) Active Problems:   Mixed hyperlipidemia   Essential hypertension   Coronary artery disease involving native coronary artery of native heart with angina pectoris (HCC)   Chronic diastolic heart failure (HCC)   Diabetes type 2, uncontrolled (HCC)   Hypothyroidism   Uncontrolled type 2 diabetes mellitus with hyperglycemia (HCC)   Atrial fibrillation, new onset (HCC)   Congestive heart failure (HCC)   Intractable cyclical vomiting with nausea   Dysphagia   Esophageal pain   Hyperglycemia   Hypoglycemia  Atrial fibrillation with rapid ventricular response -Initially Started back on IV Cardizem drip but not able to control rate, cardiology consulted for assistance with rate control and started on IV amiodarone.  Pt not symptomatic. HR remained uncontrolled, cardiology added lopressor and TEE cardioversion done on 1/7.  -Her CHADS VASC score is at least 5 and as such has been started on Coumadin with a Lovenox bridge.  lovenox bridge in process.  Following with pharmacist. -Pt had a TEE cardioversion 05/11/16 and HR now back to a controlled sinus rhythm.   - Pt on metoprolol 25 mg BID.    Intractable nausea and vomiting - Symptoms persist and have worsened some this morning.  She says that she may not be able to eat today.  GI is following and considering an EGD.    CHF, unspecified type -Suspect this is diastolic,  Echo with normal LV size and function EF 60-65%.  Unable to assess diastolic function due to rapid Afib.      Diabetes Mellitus type 2 insulin requiring CBG   Recent Labs    05/11/17 2012 05/12/17 0012 05/12/17 0314  GLUCAP 119* 143*  145*   Hypoglycemia Pt's diabetes is very brittle and she is sensitive to small doses of insulin Reduced basal insulin dose. Following BS more closely.  Continue sensitive sliding scale coverage if needed Remains on Dys 3 diet but not eating.  Ordered novolog 2 units TIDAC if eats 50% or more of meal.  DC HS coverage.  Added 3 am blood sugar testing.   Acute Kidney Injury on CKD stage 2 - likely some prerenal azotemia from GI losses and poor oral intake.   Hypothyroidism -Continue Synthroid, TSH was WNL 2 months ago  Hyperlipidemia -Continue Crestor  Depression and anxiety -Continue Cymbalta and Xanax  Hypertension -Blood pressure stable, following.   DVT prophylaxis: SCDs Code Status: Full code Family Communication:  Disposition Plan: remain in SDU  Consultants:   Cardiology  Procedures:   2D echo Study Conclusions  - Left ventricle: The cavity size was normal. Wall thickness was  normal. Systolic function was normal. The estimated ejection   fraction was in the range of 60% to 65%. Wall motion was normal;  there were no regional wall motion abnormalities. - Aortic valve: Mildly calcified annulus. Trileaflet; normal thickness leaflets. Valve area (VTI): 1.87 cm^2. - Mitral valve: There was mild regurgitation. - Left atrium: The atrium was moderately dilated. - Right atrium: The atrium was mildly dilated. - Atrial septum: No defect or patent foramen ovale was identified. - Pulmonary arteries: Systolic pressure was mildly to moderately increased. PA peak pressure: 40 mm  Hg (S).  Antimicrobials:  Anti-infectives (From admission, onward)   None     Subjective: Pt says that her nausea has worsened and she doesn't feel like she will be able to eat today.        Objective: Vitals:   05/12/17 0400 05/12/17 0430 05/12/17 0500 05/12/17 0530  BP: (!) 151/58 (!) 151/70 (!) 146/71 (!) 153/67  Pulse: 80 82 83 80  Resp: (!) 21 20 20  (!) 22  Temp:      TempSrc:        SpO2: 95% 95% 95% 95%  Weight:      Height:        Intake/Output Summary (Last 24 hours) at 05/12/2017 1610 Last data filed at 05/11/2017 2227 Gross per 24 hour  Intake 794.4 ml  Output 600 ml  Net 194.4 ml   Filed Weights   05/07/17 0500 05/08/17 0500 05/09/17 0400  Weight: 56.3 kg (124 lb 1.9 oz) 56 kg (123 lb 7.3 oz) 55.6 kg (122 lb 9.2 oz)    Examination:  General exam: Alert, awake, oriented x 3, she has a bucket at bedside for emesis.  Respiratory system: BBS CTA Cardiovascular system: normal s1, s2 sounds, no M/R/G.  Gastrointestinal system: Abdomen is nondistended, soft and nontender. No organomegaly or masses felt. Normal bowel sounds heard. Central nervous system: Alert and oriented. No focal neurological deficits. Extremities: no pretibial edema, positive pedal pulses Skin: No rashes, lesions or ulcers Psychiatry: Judgement and insight appear normal. Mood & affect appropriate.   Data Reviewed: I have personally reviewed following labs and imaging studies  CBC: Recent Labs  Lab 05/05/17 1322 05/06/17 0325 05/08/17 0421 05/09/17 0438 05/10/17 0601  WBC 9.0 6.4 7.2 10.1 7.9  NEUTROABS 7.6  --  5.8 9.0* 7.0  HGB 12.0 11.2* 10.8* 12.3 10.7*  HCT 38.9 34.9* 34.5* 40.7 33.8*  MCV 101.0* 99.4 100.6* 101.8* 101.2*  PLT 226 190 218 262 211   Basic Metabolic Panel: Recent Labs  Lab 05/06/17 0325 05/08/17 0421 05/09/17 0438 05/10/17 0601 05/11/17 0615  NA 142 141 145 138 138  K 4.1 3.5 3.5 4.6 3.9  CL 108 104 105 101 100*  CO2 24 24 29 27 27   GLUCOSE 127* 81 80 371* 166*  BUN 43* 35* 24* 33* 34*  CREATININE 1.19* 1.42* 1.26* 1.48* 1.54*  CALCIUM 8.7* 8.0* 8.7* 8.2* 8.2*  MG  --  2.0 2.1 2.1  --    GFR: Estimated Creatinine Clearance: 23.6 mL/min (A) (by C-G formula based on SCr of 1.54 mg/dL (H)). Liver Function Tests: Recent Labs  Lab 05/08/17 0421 05/09/17 0438 05/10/17 0601 05/11/17 0615  AST 20 31 28 27   ALT 30 29 27 24   ALKPHOS 69 80 72 65   BILITOT 0.8 0.8 0.5 0.5  PROT 5.8* 7.1 5.9* 6.0*  ALBUMIN 3.1* 3.6 2.9* 2.9*   No results for input(s): LIPASE, AMYLASE in the last 168 hours. No results for input(s): AMMONIA in the last 168 hours. Coagulation Profile: Recent Labs  Lab 05/08/17 0421 05/09/17 0806 05/10/17 0601 05/11/17 0615 05/12/17 0418  INR 2.67 2.35 2.33 1.83 1.82   Cardiac Enzymes: Recent Labs  Lab 05/05/17 1322 05/05/17 2141 05/06/17 0325 05/06/17 1039  TROPONINI <0.03 <0.03 <0.03 <0.03   BNP (last 3 results) No results for input(s): PROBNP in the last 8760 hours. HbA1C: No results for input(s): HGBA1C in the last 72 hours. CBG: Recent Labs  Lab 05/11/17 1320 05/11/17 1632 05/11/17 2012 05/12/17 0012 05/12/17 9604  GLUCAP 139* 123* 119* 143* 145*   Lipid Profile: No results for input(s): CHOL, HDL, LDLCALC, TRIG, CHOLHDL, LDLDIRECT in the last 72 hours. Thyroid Function Tests: Recent Labs    05/09/17 0806  TSH 1.859   Anemia Panel: No results for input(s): VITAMINB12, FOLATE, FERRITIN, TIBC, IRON, RETICCTPCT in the last 72 hours. Urine analysis:    Component Value Date/Time   COLORURINE YELLOW 03/14/2014 1227   APPEARANCEUR CLEAR 03/14/2014 1227   LABSPEC 1.015 03/14/2014 1227   PHURINE 5.0 03/14/2014 1227   GLUCOSEU 100 (A) 03/14/2014 1227   HGBUR NEGATIVE 03/14/2014 1227   BILIRUBINUR neg 06/13/2015 1710   KETONESUR NEGATIVE 03/14/2014 1227   PROTEINUR neg 06/13/2015 1710   PROTEINUR NEGATIVE 03/14/2014 1227   UROBILINOGEN negative 06/13/2015 1710   UROBILINOGEN 0.2 03/14/2014 1227   NITRITE neg 06/13/2015 1710   NITRITE NEGATIVE 03/14/2014 1227   LEUKOCYTESUR Negative 06/13/2015 1710    Recent Results (from the past 240 hour(s))  MRSA PCR Screening     Status: None   Collection Time: 05/05/17  9:20 PM  Result Value Ref Range Status   MRSA by PCR NEGATIVE NEGATIVE Final    Comment:        The GeneXpert MRSA Assay (FDA approved for NASAL specimens only), is one  component of a comprehensive MRSA colonization surveillance program. It is not intended to diagnose MRSA infection nor to guide or monitor treatment for MRSA infections.     Radiology Studies: No results found. Scheduled Meds: . DULoxetine  30 mg Oral Daily  . enoxaparin (LOVENOX) injection  60 mg Subcutaneous Q24H  . feeding supplement (GLUCERNA SHAKE)  237 mL Oral TID BM  . insulin aspart  0-9 Units Subcutaneous TID WC  . insulin aspart  2 Units Subcutaneous TID WC  . insulin glargine  6 Units Subcutaneous Daily  . levothyroxine  50 mcg Oral QAC breakfast  . linaclotide  290 mcg Oral QAC breakfast  . metoCLOPramide (REGLAN) injection  10 mg Intravenous TID AC  . metoprolol tartrate  25 mg Oral BID  . pantoprazole (PROTONIX) IV  40 mg Intravenous BID AC  . rosuvastatin  5 mg Oral Daily  . sodium chloride flush  3 mL Intravenous Q12H   Continuous Infusions: . sodium chloride    . amiodarone 30 mg/hr (05/11/17 2227)    LOS: 7 days   Critical Care Time spent: 32 minutes. Greater than 50% of this time was spent in direct contact with the patient coordinating care.  Standley Dakinslanford Johnson, MD [facility] Pager [phone]  If 7PM-7AM, please contact night-coverage [email or web address] Password [email or web address] 05/12/2017, 6:52 AM

## 2017-05-13 ENCOUNTER — Inpatient Hospital Stay (HOSPITAL_COMMUNITY): Payer: Medicare HMO

## 2017-05-13 ENCOUNTER — Encounter (HOSPITAL_COMMUNITY): Payer: Self-pay | Admitting: Anesthesiology

## 2017-05-13 ENCOUNTER — Encounter (HOSPITAL_COMMUNITY)
Admission: EM | Disposition: A | Discharge status: Skilled Nursing Facility | Payer: Self-pay | Source: Home / Self Care | Attending: Family Medicine

## 2017-05-13 ENCOUNTER — Encounter (HOSPITAL_COMMUNITY): Payer: Self-pay | Admitting: Cardiology

## 2017-05-13 DIAGNOSIS — J069 Acute upper respiratory infection, unspecified: Secondary | ICD-10-CM

## 2017-05-13 DIAGNOSIS — E11649 Type 2 diabetes mellitus with hypoglycemia without coma: Secondary | ICD-10-CM

## 2017-05-13 DIAGNOSIS — R112 Nausea with vomiting, unspecified: Secondary | ICD-10-CM

## 2017-05-13 DIAGNOSIS — E039 Hypothyroidism, unspecified: Secondary | ICD-10-CM

## 2017-05-13 LAB — COMPREHENSIVE METABOLIC PANEL
ALT: 21 U/L (ref 14–54)
ANION GAP: 13 (ref 5–15)
AST: 24 U/L (ref 15–41)
Albumin: 2.8 g/dL — ABNORMAL LOW (ref 3.5–5.0)
Alkaline Phosphatase: 64 U/L (ref 38–126)
BUN: 34 mg/dL — ABNORMAL HIGH (ref 6–20)
CO2: 26 mmol/L (ref 22–32)
Calcium: 7.9 mg/dL — ABNORMAL LOW (ref 8.9–10.3)
Chloride: 102 mmol/L (ref 101–111)
Creatinine, Ser: 1.51 mg/dL — ABNORMAL HIGH (ref 0.44–1.00)
GFR calc Af Amer: 38 mL/min — ABNORMAL LOW (ref >60)
GFR, EST NON AFRICAN AMERICAN: 32 mL/min — AB (ref >60)
Glucose, Bld: 133 mg/dL — ABNORMAL HIGH (ref 65–99)
POTASSIUM: 3.5 mmol/L (ref 3.5–5.1)
SODIUM: 141 mmol/L (ref 135–145)
Total Bilirubin: 0.9 mg/dL (ref 0.3–1.2)
Total Protein: 6.2 g/dL — ABNORMAL LOW (ref 6.5–8.1)

## 2017-05-13 LAB — PROTIME-INR
INR: 2.21
INR: 2.05
PROTHROMBIN TIME: 24.3 s — AB (ref 11.4–15.2)
Prothrombin Time: 22.9 seconds — ABNORMAL HIGH (ref 11.4–15.2)

## 2017-05-13 LAB — GLUCOSE, CAPILLARY
GLUCOSE-CAPILLARY: 149 mg/dL — AB (ref 65–99)
GLUCOSE-CAPILLARY: 160 mg/dL — AB (ref 65–99)
GLUCOSE-CAPILLARY: 157 mg/dL — AB (ref 65–99)
Glucose-Capillary: 292 mg/dL — ABNORMAL HIGH (ref 65–99)

## 2017-05-13 LAB — CBC
HCT: 34.8 % — ABNORMAL LOW (ref 36.0–46.0)
Hemoglobin: 10.7 g/dL — ABNORMAL LOW (ref 12.0–15.0)
MCH: 30.7 pg (ref 26.0–34.0)
MCHC: 30.7 g/dL (ref 30.0–36.0)
MCV: 100 fL (ref 78.0–100.0)
PLATELETS: 254 10*3/uL (ref 150–400)
RBC: 3.48 MIL/uL — AB (ref 3.87–5.11)
RDW: 13.5 % (ref 11.5–15.5)
WBC: 9.8 10*3/uL (ref 4.0–10.5)

## 2017-05-13 LAB — PROCALCITONIN: PROCALCITONIN: 0.25 ng/mL

## 2017-05-13 LAB — BRAIN NATRIURETIC PEPTIDE: B NATRIURETIC PEPTIDE 5: 373 pg/mL — AB (ref 0.0–100.0)

## 2017-05-13 SURGERY — ESOPHAGOGASTRODUODENOSCOPY (EGD) WITH PROPOFOL
Anesthesia: Monitor Anesthesia Care

## 2017-05-13 MED ORDER — FUROSEMIDE 10 MG/ML IJ SOLN
40.0000 mg | Freq: Two times a day (BID) | INTRAMUSCULAR | Status: DC
  Administered 2017-05-13 – 2017-05-15 (×4): 40 mg via INTRAVENOUS
  Filled 2017-05-13 (×4): qty 4

## 2017-05-13 MED ORDER — BISACODYL 5 MG PO TBEC
10.0000 mg | DELAYED_RELEASE_TABLET | Freq: Once | ORAL | Status: AC
  Administered 2017-05-13: 10 mg via ORAL
  Filled 2017-05-13: qty 2

## 2017-05-13 MED ORDER — BISACODYL 10 MG RE SUPP
10.0000 mg | Freq: Once | RECTAL | Status: AC
  Administered 2017-05-13: 10 mg via RECTAL
  Filled 2017-05-13: qty 1

## 2017-05-13 MED ORDER — LEVOFLOXACIN IN D5W 750 MG/150ML IV SOLN
750.0000 mg | INTRAVENOUS | Status: DC
  Administered 2017-05-13: 750 mg via INTRAVENOUS
  Filled 2017-05-13 (×2): qty 150

## 2017-05-13 MED ORDER — METOPROLOL TARTRATE 50 MG PO TABS
50.0000 mg | ORAL_TABLET | Freq: Two times a day (BID) | ORAL | Status: DC
  Administered 2017-05-13 – 2017-05-21 (×17): 50 mg via ORAL
  Filled 2017-05-13 (×16): qty 1

## 2017-05-13 NOTE — Plan of Care (Signed)
  Acute Rehab PT Goals(only PT should resolve) Pt Will Go Supine/Side To Sit 05/13/2017 1434 - Progressing by Ocie BobWatkins, Kelin Nixon, PT Flowsheets Taken 05/13/2017 1434  Pt will go Supine/Side to Sit Independently Patient Will Transfer Sit To/From Stand 05/13/2017 1434 - Progressing by Ocie BobWatkins, Lynnsey Barbara, PT Flowsheets Taken 05/13/2017 1434  Patient will transfer sit to/from stand with supervision Pt Will Transfer Bed To Chair/Chair To Bed 05/13/2017 1434 - Progressing by Ocie BobWatkins, Linken Mcglothen, PT Flowsheets Taken 05/13/2017 1434  Pt will Transfer Bed to Chair/Chair to Bed with supervision Pt Will Ambulate 05/13/2017 1434 - Progressing by Ocie BobWatkins, Dabria Wadas, PT Flowsheets Taken 05/13/2017 1434  Pt will Ambulate 50 feet;with supervision;with cane;with rolling walker  2:35 PM, 05/13/17 Ocie BobJames Naol Ontiveros, MPT Physical Therapist [facility] 336 [phone] office [phone] mobile phone

## 2017-05-13 NOTE — Progress Notes (Signed)
Progress Note  Patient Name: Ellen Woods Date of Encounter: 05/13/2017  Primary Cardiologist: Rollene RotundaJames Hochrein, MD   Subjective   Anxious this morning. Nauseous and constipated. No palpitations or chest pain.  Inpatient Medications    Scheduled Meds: . amiodarone  200 mg Oral BID  . dextrose  25 mL Intravenous Once  . DULoxetine  30 mg Oral Daily  . [START ON 05/14/2017] enoxaparin (LOVENOX) injection  60 mg Subcutaneous Q24H  . feeding supplement (GLUCERNA SHAKE)  237 mL Oral TID BM  . insulin aspart  0-9 Units Subcutaneous TID WC  . insulin aspart  2 Units Subcutaneous TID WC  . insulin glargine  6 Units Subcutaneous Daily  . levothyroxine  50 mcg Oral QAC breakfast  . linaclotide  290 mcg Oral QAC breakfast  . metoCLOPramide (REGLAN) injection  10 mg Intravenous TID AC  . metoprolol tartrate  25 mg Oral BID  . pantoprazole (PROTONIX) IV  40 mg Intravenous BID AC  . rosuvastatin  5 mg Oral Daily  . sodium chloride flush  3 mL Intravenous Q12H   Continuous Infusions: . sodium chloride     PRN Meds: acetaminophen, ALPRAZolam, alum & mag hydroxide-simeth, HYDROcodone-acetaminophen, promethazine, sodium chloride, sodium chloride flush   Vital Signs    Vitals:   05/12/17 1500 05/12/17 1600 05/12/17 2252 05/13/17 0640  BP: (!) 142/54 140/67 (!) 152/84 (!) 144/86  Pulse: 89 89 93 86  Resp: 20 20 18 19   Temp:   99.5 F (37.5 C) 98.9 F (37.2 C)  TempSrc:   Oral   SpO2: 92% 93% 92% 92%  Weight:      Height:        Intake/Output Summary (Last 24 hours) at 05/13/2017 0944 Last data filed at 05/13/2017 0500 Gross per 24 hour  Intake -  Output 1 ml  Net -1 ml   Filed Weights   05/08/17 0500 05/09/17 0400 05/12/17 0500  Weight: 123 lb 7.3 oz (56 kg) 122 lb 9.2 oz (55.6 kg) 123 lb 0.3 oz (55.8 kg)    Telemetry    Normal sinus rhythm with frequent PACs- Personally Reviewed  Physical Exam   GEN: Anxious, no acute distress.   Neck: No JVD Cardiac: RRR, no  murmurs, rubs, or gallops.  Respiratory: Clear to auscultation bilaterally. GI: Soft, nontender, non-distended  MS: No edema; No deformity. Neuro:  Nonfocal  Psych: Anxious  Labs    Chemistry Recent Labs  Lab 05/10/17 0601 05/11/17 0615 05/12/17 0418 05/13/17 0428  NA 138 138 137 141  K 4.6 3.9 3.7 3.5  CL 101 100* 99* 102  CO2 27 27 24 26   GLUCOSE 371* 166* 175* 133*  BUN 33* 34* 33* 34*  CREATININE 1.48* 1.54* 1.58* 1.51*  CALCIUM 8.2* 8.2* 8.0* 7.9*  PROT 5.9* 6.0*  --  6.2*  ALBUMIN 2.9* 2.9*  --  2.8*  AST 28 27  --  24  ALT 27 24  --  21  ALKPHOS 72 65  --  64  BILITOT 0.5 0.5  --  0.9  GFRNONAA 33* 32* 31* 32*  GFRAA 38* 37* 36* 38*  ANIONGAP 10 11 14 13      Hematology Recent Labs  Lab 05/10/17 0601 05/12/17 0418 05/13/17 0428  WBC 7.9 8.3 9.8  RBC 3.34* 3.63* 3.48*  HGB 10.7* 11.2* 10.7*  HCT 33.8* 36.2 34.8*  MCV 101.2* 99.7 100.0  MCH 32.0 30.9 30.7  MCHC 31.7 30.9 30.7  RDW 13.5 13.4 13.5  PLT 211 223 254    Cardiac Enzymes Recent Labs  Lab 05/06/17 1039  TROPONINI <0.03   No results for input(s): TROPIPOC in the last 168 hours.   Radiology    No results found.  Cardiac Studies   Echocardiogram 05/06/2017: Study Conclusions   - Left ventricle: The cavity size was normal. Wall thickness was   normal. Systolic function was normal. The estimated ejection   fraction was in the range of 60% to 65%. Wall motion was normal;   there were no regional wall motion abnormalities. - Aortic valve: Mildly calcified annulus. Trileaflet; normal   thickness leaflets. Valve area (VTI): 1.87 cm^2. - Mitral valve: There was mild regurgitation. - Left atrium: The atrium was moderately dilated. - Right atrium: The atrium was mildly dilated. - Atrial septum: No defect or patent foramen ovale was identified. - Pulmonary arteries: Systolic pressure was mildly to moderately   increased. PA peak pressure: 40 mm Hg (S).   Patient Profile     77 y.o.  female with a history of nonobstructive CAD, hypertension, hyperlipidemia, type 2 diabetes mellitus, and COPD, currently being managed for newly documented atrial fibrillation with RVR, CHADSVASC score of 5.   Assessment & Plan    1. Newly documented, persistent atrial fibrillation with RVR, CHADSVASC score of 5. status post successful TEE guided cardioversion on 1/7, maintaining sinus rhythm with frequent PACs. INR therapeutic 2.2 on oral Amio load. Will increase lopressor to 50 mg twice daily for better rate control and blood pressure control.   2. Recent nausea and emesis. LFTs were normal. No abdominal pain. Workup per primary team.   3. History of nonobstructive CAD no chest pain   4. Hyperlipidemia, continues on Crestor.   5. Essential hypertension, blood pressure trending back up. She was previously on lisinopril and Norvasc as an outpatient.  Hold off on lisinopril because of renal insufficiency.  Increasing metoprolol today.  Continue to monitor and if remains elevated can resume Norvasc.    For questions or updates, please contact [facility] Please consult [email or web address] for contact info under Cardiology/STEMI.      Signed, Jacolyn Reedy, PA-C  05/13/2017, 9:44 AM     Attending note:  Patient seen and examined. Reviewed interval hospital course and discussed the case with Ms. Geni Bers PA-C. She remains in sinus rhythm although with increased atrial ectopy by telemetry which I personally reviewed. She is somewhat anxious today, still experiencing nausea. Follow-up LFTs were normal. From a cardiac perspective, plan is to increase Lopressor to 50 mg twice daily, otherwise continue Coumadin, INR is therapeutic today at 2.2. Also continue low-dose oral amiodarone load at 200 mg twice daily. Follow blood pressure trend after increase in Lopressor.  Jonelle Sidle, M.D., F.A.C.C.

## 2017-05-13 NOTE — Evaluation (Signed)
Physical Therapy Evaluation Patient Details Name: Ellen Woods MRN: [id number] DOB: 09/01/1940 Today's Date: 05/13/2017   History of Present Illness  Ellen Woods is a 77 y.o. female with medical history significant of COPD, coronary artery disease, diabetes mellitus type 2, dyspnea on exertion who presented to the emergency department with palpitations and facial swelling.  Patient's daughter and son are bedside and states that this morning patient began to have swelling on one side of her face and it appeared as though her heart was "beating out of her chest".  They used an at home blood pressure monitor and stated her blood pressure was reasonably controlled but her heart rate was in the 150s.  They gave patient a Xanax and stated that her heart rate came down and the swelling improved.  She presented to the emergency department for further evaluation of her tachycardia.  Patient reports that she has chronic dyspnea on exertion and has told this to her cardiologist who she saw in October 2018 but states that no further workup was ordered for her.  She does mention that she has a history of coronary artery disease with upon reviewing the note from the cardiologist in October she had a negative Lexiscan Myoview.  At that time her ejection fraction was noted to be 78%.  Patient reports no recent sick contacts except for her son and daughter who had gastroenteritis.  She voices that she occasionally has vomiting but this she attributes to her esophagus requiring stretching which she [place]  She denies fevers or chills, denies diarrhea or constipation, and denies chest pain.  She states that she thinks her palpitations began last night before she went to bed but she did not measure her heart rate at that time.  According to her problem list patient has a history of paroxysmal atrial fibrillation from 2013 however she cannot recall this incident.  There is a  cardiology note from June 2017 that voices that she had a Holter monitor that demonstrated premature atrial contractions and atrial tachycardia.  She does have a history of a PDA which is small and has been managed medically.    Clinical Impression  Patient mostly limited due to SOB with O2 saturation dropping to 82-87% while on 2 LPM O2 during gait training, unsteady on feet with labored movement, occasional loss of balance requiring Min assist to prevent falling and put back to bed after therapy.  Patient will benefit from continued physical therapy in hospital and recommended venue below to increase strength, balance, endurance for safe ADLs and gait.    Follow Up Recommendations Home health PT;Supervision/Assistance - 24 hour    Equipment Recommendations  None recommended by PT    Recommendations for Other Services       Precautions / Restrictions Precautions Precautions: Fall Restrictions Weight Bearing Restrictions: No      Mobility  Bed Mobility Overal bed mobility: Needs Assistance Bed Mobility: Supine to Sit;Sit to Supine     Supine to sit: Supervision Sit to supine: Supervision      Transfers Overall transfer level: Needs assistance Equipment used: Straight cane;1 person hand held assist Transfers: Sit to/from Stand Sit to Stand: Min assist            Ambulation/Gait Ambulation/Gait assistance: Min assist;Mod assist Ambulation Distance (Feet): 30 Feet Assistive device: Straight cane;1 person hand held assist Gait Pattern/deviations: Step-to pattern;Decreased step length - right;Decreased step length - left;Decreased stride length  Gait velocity interpretation: Below normal speed for age/gender General Gait Details: patient demonstrates slow labored movement with 3 point gait pattern using SPC, occasional stumbling with near loss of balance, limited secondary to fatigue and difficulty breathing with O2 sat dropping to 87% while on 2 LPM O2  Stairs             Wheelchair Mobility    Modified Rankin (Stroke Patients Only)       Balance Overall balance assessment: Needs assistance Sitting-balance support: No upper extremity supported;Feet unsupported Sitting balance-Leahy Scale: Good     Standing balance support: Single extremity supported;During functional activity Standing balance-Leahy Scale: Fair Standing balance comment: fair/poor with SPC                             Pertinent Vitals/Pain Pain Assessment: No/denies pain    Home Living Family/patient expects to be discharged to:: Private residence Living Arrangements: Children Available Help at Discharge: Family;Available 24 hours/day Type of Home: Mobile home Home Access: Stairs to enter Entrance Stairs-Rails: Right;Left(to wide to reach both) Entrance Stairs-Number of Steps: 4 Home Layout: One level Home Equipment: Cane - single point;Walker - 4 wheels;Shower seat;Bedside commode      Prior Function Level of Independence: Independent with assistive device(s)         Comments: Household ambulation without assistive device, uses SPC for community distances     Hand Dominance        Extremity/Trunk Assessment   Upper Extremity Assessment Upper Extremity Assessment: Generalized weakness    Lower Extremity Assessment Lower Extremity Assessment: Generalized weakness    Cervical / Trunk Assessment Cervical / Trunk Assessment: Normal  Communication   Communication: No difficulties  Cognition Arousal/Alertness: Awake/alert Behavior During Therapy: WFL for tasks assessed/performed Overall Cognitive Status: Within Functional Limits for tasks assessed                                        General Comments      Exercises General Exercises - Lower Extremity Long Arc Quad: Seated;AROM;Strengthening;15 reps   Assessment/Plan    PT Assessment Patient needs continued PT services  PT Problem List Decreased  strength;Decreased activity tolerance;Decreased balance;Decreased mobility;Cardiopulmonary status limiting activity       PT Treatment Interventions Gait training;Stair training;Functional mobility training;Therapeutic activities;Therapeutic exercise;Patient/family education    PT Goals (Current goals can be found in the Care Plan section)  Acute Rehab PT Goals Patient Stated Goal: return home with family to assist PT Goal Formulation: With patient/family Time For Goal Achievement: 05/27/17 Potential to Achieve Goals: Good    Frequency Min 3X/week   Barriers to discharge        Co-evaluation               AM-PAC PT "6 Clicks" Daily Activity  Outcome Measure Difficulty turning over in bed (including adjusting bedclothes, sheets and blankets)?: None Difficulty moving from lying on back to sitting on the side of the bed? : None Difficulty sitting down on and standing up from a chair with arms (e.g., wheelchair, bedside commode, etc,.)?: A Little Help needed moving to and from a bed to chair (including a wheelchair)?: A Little Help needed walking in hospital room?: A Lot Help needed climbing 3-5 steps with a railing? : A Lot 6 Click Score: 18    End of Session Equipment Utilized During  Treatment: Gait belt;Oxygen Activity Tolerance: Patient tolerated treatment well;Patient limited by fatigue Patient left: in bed;with call bell/phone within reach;with family/visitor present Nurse Communication: Mobility status PT Visit Diagnosis: Unsteadiness on feet (R26.81);Other abnormalities of gait and mobility (R26.89);Muscle weakness (generalized) (M62.81)    Time: 1610-9604 PT Time Calculation (min) (ACUTE ONLY): 32 min   Charges:   PT Evaluation $PT Eval Moderate Complexity: 1 Mod PT Treatments $Therapeutic Activity: 23-37 mins   PT G Codes:        2:31 PM, 2017-05-14 Ocie Bob, MPT Physical Therapist with [facility] 336 [phone] office [phone]  mobile phone

## 2017-05-13 NOTE — Progress Notes (Addendum)
PROGRESS NOTE    Ellen Woods   ZOX:[id number]  DOB: 1940/10/24  DOA: 05/05/2017 PCP: Dettinger, Elige Radon, MD   Brief Narrative:  Ellen Woods is a 77 y.o. female with medical history significant of COPD, coronary artery disease, diabetes mellitus type 2, dyspnea on exertion who presented to the emergency department with palpitations and facial swelling. In the ER she was found to have A-fib with RVR and pulmonary edema.  Subjective: States she feels short of breath today. She admits to a cough for about 1 wk now which is mostly non- productive but she occassionally brings up clear sputum. Cough was quite bad last night. She continues to have nausea. Her daughter notes that her mother has not had a BM in > 1 wk and in the past, has had impaction of stool     Assessment & Plan:   Principal Problem:   Atrial fibrillation with RVR - underwent TEE cardioversion on 1/8 - CHA2DS2-VASc Score 5 - being managed by cardiology with Amiodarone and Lopressor - has been on Coumadin but this is on hold as she needs an EGD  Active Problems: Nausea/ vomiting - occasional dysphagia and regurgitation of food per daughter - GI following and EGD planned when INR is subtherapeutic - she has needed esophageal dilation about 15 yrs ago - on Reglan, Phenergan, Protonix  Dyspnea/ acute resp distress - RR is in 20s on my exam and there are crackles at bases - distress is acute since this AM per patient and daughter- per patient, she has had a cough for about 1 wk - CXR obtained today- is not clear on whether this is pneumonia or CHF- obtain pro calcitonin and BNP - start Levaquin - ECHO does not suggest any underlying CHF but she may have d CHF - if BNP elevated, start Lasix    Chronic diastolic heart failure  - see above    Diabetes type 2, uncontrolled - cont insulin and follow    Hypothyroidism - Synthroid     DVT prophylaxis: SCDs Code Status: Full code Family  Communication: daughter Disposition Plan:  Consultants:   Cardiology  GI Procedures:  2 D ECHO Left ventricle: The cavity size was normal. Wall thickness was   normal. Systolic function was normal. The estimated ejection   fraction was in the range of 60% to 65%. Wall motion was normal;   there were no regional wall motion abnormalities. - Aortic valve: Mildly calcified annulus. Trileaflet; normal   thickness leaflets. Valve area (VTI): 1.87 cm^2. - Mitral valve: There was mild regurgitation. - Left atrium: The atrium was moderately dilated. - Right atrium: The atrium was mildly dilated. - Atrial septum: No defect or patent foramen ovale was identified. - Pulmonary arteries: Systolic pressure was mildly to moderately   increased. PA peak pressure: 40 mm Hg (S).  TEE cardioversion  Antimicrobials:  Anti-infectives (From admission, onward)   None       Objective: Vitals:   05/12/17 1600 05/12/17 2252 05/13/17 0640 05/13/17 1500  BP: 140/67 (!) 152/84 (!) 144/86 130/69  Pulse: 89 93 86 72  Resp: 20 18 19 18   Temp:  99.5 F (37.5 C) 98.9 F (37.2 C) 98.3 F (36.8 C)  TempSrc:  Oral  Oral  SpO2: 93% 92% 92% 93%  Weight:      Height:        Intake/Output Summary (Last 24 hours) at 05/13/2017 1548 Last data filed at 05/13/2017 1300 Gross per 24  hour  Intake 480 ml  Output 301 ml  Net 179 ml   Filed Weights   05/08/17 0500 05/09/17 0400 05/12/17 0500  Weight: 56 kg (123 lb 7.3 oz) 55.6 kg (122 lb 9.2 oz) 55.8 kg (123 lb 0.3 oz)    Examination: General exam: Appears comfortable  HEENT: PERRLA, oral mucosa moist, no sclera icterus or thrush Respiratory system: crackles at bases- RR in high 20s- -pulse ox 93% on 2 L Cardiovascular system: S1 & S2 heard, RRR.  No murmurs  Gastrointestinal system: Abdomen soft, non-tender, nondistended. Normal bowel sound. No organomegaly Central nervous system: Alert and oriented. No focal neurological deficits. Extremities: No  cyanosis, clubbing or edema Skin: No rashes or ulcers Psychiatry:  Mood & affect appropriate.     Data Reviewed: I have personally reviewed following labs and imaging studies  CBC: Recent Labs  Lab 05/08/17 0421 05/09/17 0438 05/10/17 0601 05/12/17 0418 05/13/17 0428  WBC 7.2 10.1 7.9 8.3 9.8  NEUTROABS 5.8 9.0* 7.0  --   --   HGB 10.8* 12.3 10.7* 11.2* 10.7*  HCT 34.5* 40.7 33.8* 36.2 34.8*  MCV 100.6* 101.8* 101.2* 99.7 100.0  PLT 218 262 211 223 254   Basic Metabolic Panel: Recent Labs  Lab 05/08/17 0421 05/09/17 0438 05/10/17 0601 05/11/17 0615 05/12/17 0418 05/13/17 0428  NA 141 145 138 138 137 141  K 3.5 3.5 4.6 3.9 3.7 3.5  CL 104 105 101 100* 99* 102  CO2 24 29 27 27 24 26   GLUCOSE 81 80 371* 166* 175* 133*  BUN 35* 24* 33* 34* 33* 34*  CREATININE 1.42* 1.26* 1.48* 1.54* 1.58* 1.51*  CALCIUM 8.0* 8.7* 8.2* 8.2* 8.0* 7.9*  MG 2.0 2.1 2.1  --   --   --    GFR: Estimated Creatinine Clearance: 24.1 mL/min (A) (by C-G formula based on SCr of 1.51 mg/dL (H)). Liver Function Tests: Recent Labs  Lab 05/08/17 0421 05/09/17 0438 05/10/17 0601 05/11/17 0615 05/13/17 0428  AST 20 31 28 27 24   ALT 30 29 27 24 21   ALKPHOS 69 80 72 65 64  BILITOT 0.8 0.8 0.5 0.5 0.9  PROT 5.8* 7.1 5.9* 6.0* 6.2*  ALBUMIN 3.1* 3.6 2.9* 2.9* 2.8*   No results for input(s): LIPASE, AMYLASE in the last 168 hours. No results for input(s): AMMONIA in the last 168 hours. Coagulation Profile: Recent Labs  Lab 05/10/17 0601 05/11/17 0615 05/12/17 0418 05/13/17 0428 05/13/17 0934  INR 2.33 1.83 1.82 2.21 2.05   Cardiac Enzymes: No results for input(s): CKTOTAL, CKMB, CKMBINDEX, TROPONINI in the last 168 hours. BNP (last 3 results) No results for input(s): PROBNP in the last 8760 hours. HbA1C: No results for input(s): HGBA1C in the last 72 hours. CBG: Recent Labs  Lab 05/12/17 1634 05/12/17 1719 05/12/17 2250 05/13/17 0739 05/13/17 1119  GLUCAP 65 111* 106* 149* 160*    Lipid Profile: No results for input(s): CHOL, HDL, LDLCALC, TRIG, CHOLHDL, LDLDIRECT in the last 72 hours. Thyroid Function Tests: No results for input(s): TSH, T4TOTAL, FREET4, T3FREE, THYROIDAB in the last 72 hours. Anemia Panel: No results for input(s): VITAMINB12, FOLATE, FERRITIN, TIBC, IRON, RETICCTPCT in the last 72 hours. Urine analysis:    Component Value Date/Time   COLORURINE YELLOW 03/14/2014 1227   APPEARANCEUR CLEAR 03/14/2014 1227   LABSPEC 1.015 03/14/2014 1227   PHURINE 5.0 03/14/2014 1227   GLUCOSEU 100 (A) 03/14/2014 1227   HGBUR NEGATIVE 03/14/2014 1227   BILIRUBINUR neg 06/13/2015 1710  KETONESUR NEGATIVE 03/14/2014 1227   PROTEINUR neg 06/13/2015 1710   PROTEINUR NEGATIVE 03/14/2014 1227   UROBILINOGEN negative 06/13/2015 1710   UROBILINOGEN 0.2 03/14/2014 1227   NITRITE neg 06/13/2015 1710   NITRITE NEGATIVE 03/14/2014 1227   LEUKOCYTESUR Negative 06/13/2015 1710   Sepsis Labs: @LABRCNTIP (procalcitonin:4,lacticidven:4) ) Recent Results (from the past 240 hour(s))  MRSA PCR Screening     Status: None   Collection Time: 05/05/17  9:20 PM  Result Value Ref Range Status   MRSA by PCR NEGATIVE NEGATIVE Final    Comment:        The GeneXpert MRSA Assay (FDA approved for NASAL specimens only), is one component of a comprehensive MRSA colonization surveillance program. It is not intended to diagnose MRSA infection nor to guide or monitor treatment for MRSA infections.          Radiology Studies: Dg Chest 2 View  Result Date: 05/13/2017 CLINICAL DATA:  Shortness of Breath EXAM: CHEST  2 VIEW COMPARISON:  May 09, 2017 FINDINGS: There is stable cardiomegaly with mild pulmonary venous hypertension. There is aortic atherosclerosis. There is a loculated right pleural effusion with a somewhat equivocal left pleural effusion. There is patchy airspace opacity bilaterally, overall increased from recent study. There does not appear to be appreciable  interstitial edema. No adenopathy evident.  No bone lesions. IMPRESSION: There is evidence of underlying pulmonary vascular congestion. Opacification bilaterally has an appearance more suggestive of multifocal pneumonia than pulmonary edema, although pulmonary edema could present in this manner. Both pulmonary edema and pneumonia may present concurrently. There is aortic atherosclerosis. No adenopathy evident. Partially loculated right pleural effusion with equivocal left pleural effusion noted. Aortic Atherosclerosis (ICD10-I70.0). Electronically Signed   By: Bretta BangWilliam  Woodruff III M.D.   On: 05/13/2017 11:39      Scheduled Meds: . amiodarone  200 mg Oral BID  . dextrose  25 mL Intravenous Once  . DULoxetine  30 mg Oral Daily  . feeding supplement (GLUCERNA SHAKE)  237 mL Oral TID BM  . insulin aspart  0-9 Units Subcutaneous TID WC  . insulin aspart  2 Units Subcutaneous TID WC  . insulin glargine  6 Units Subcutaneous Daily  . levothyroxine  50 mcg Oral QAC breakfast  . linaclotide  290 mcg Oral QAC breakfast  . metoCLOPramide (REGLAN) injection  10 mg Intravenous TID AC  . metoprolol tartrate  50 mg Oral BID  . pantoprazole (PROTONIX) IV  40 mg Intravenous BID AC  . rosuvastatin  5 mg Oral Daily  . sodium chloride flush  3 mL Intravenous Q12H   Continuous Infusions: . sodium chloride       LOS: 8 days    Time spent in minutes: 45    Calvert CantorSaima Benna Arno, MD [facility] Pager: [email or web address] Password [email or web address] 05/13/2017, 3:48 PM

## 2017-05-13 NOTE — Progress Notes (Signed)
Subjective:  Continues to complain of being unable drink/eat. No abd pain. Feels more congested and breathing worse today.   Objective: Vital signs in last 24 hours: Temp:  [98.3 F (36.8 C)-99.5 F (37.5 C)] 98.9 F (37.2 C) (01/09 0640) Pulse Rate:  [84-93] 86 (01/09 0640) Resp:  [18-23] 19 (01/09 0640) BP: (132-157)/(54-86) 144/86 (01/09 0640) SpO2:  [92 %-94 %] 92 % (01/09 0640) Last BM Date: 05/05/17(per patient) General:   Alert,  Well-developed, well-nourished, pleasant and cooperative in NAD Head:  Normocephalic and atraumatic. Eyes:  Sclera clear, no icterus.  Chest: diminished breath sounds in the bases. Use of accessory muscles noted. Tachypnea. No rales, rhonchi, crackles.    Heart:  Regular rate and rhythm; no murmurs, clicks, rubs,  or gallops. Abdomen:  Soft, nontender and nondistended. Normal bowel sounds, without guarding, and without rebound.   Extremities:  Without clubbing, deformity or edema. Neurologic:  Alert and  oriented x4;  grossly normal neurologically. Skin:  Intact without significant lesions or rashes. Psych:  Alert and cooperative. Normal mood and affect.  Intake/Output from previous day: 01/08 0701 - 01/09 0700 In: -  Out: 1 [Urine:1] Intake/Output this shift: No intake/output data recorded.  Lab Results: CBC Recent Labs    05/12/17 0418 05/13/17 0428  WBC 8.3 9.8  HGB 11.2* 10.7*  HCT 36.2 34.8*  MCV 99.7 100.0  PLT 223 254   BMET Recent Labs    05/11/17 0615 05/12/17 0418 05/13/17 0428  NA 138 137 141  K 3.9 3.7 3.5  CL 100* 99* 102  CO2 27 24 26   GLUCOSE 166* 175* 133*  BUN 34* 33* 34*  CREATININE 1.54* 1.58* 1.51*  CALCIUM 8.2* 8.0* 7.9*   LFTs Recent Labs    05/11/17 0615 05/13/17 0428  BILITOT 0.5 0.9  ALKPHOS 65 64  AST 27 24  ALT 24 21  PROT 6.0* 6.2*  ALBUMIN 2.9* 2.8*   No results for input(s): LIPASE in the last 72 hours. PT/INR Recent Labs    05/11/17 0615 05/12/17 0418 05/13/17 0428  LABPROT  21.0* 20.9* 24.3*  INR 1.83 1.82 2.21      Imaging Studies: Dg Chest Port 1 View  Result Date: 05/09/2017 CLINICAL DATA:  Injured fibrillation, cough, fever EXAM: PORTABLE CHEST 1 VIEW COMPARISON:  05/05/2017 FINDINGS: Bilateral diffuse mild interstitial thickening, right greater than left. Patchy right lung alveolar airspace opacities. Small right pleural effusion. Small left pleural effusion. No pneumothorax. Stable cardiomegaly. No acute osseous abnormality. IMPRESSION: Persistent CHF without significant interval change compared with 05/05/2017. Electronically Signed   By: Elige KoHetal  Patel   On: 05/09/2017 14:00   Dg Chest Port 1 View  Result Date: 05/05/2017 CLINICAL DATA:  Chest pain EXAM: PORTABLE CHEST 1 VIEW COMPARISON:  10/12/2016 FINDINGS: Cardiac shadow remains enlarged. Aortic calcifications are noted. Vascular congestion with interstitial edema and bilateral pleural effusions right greater than left is noted. No bony abnormality is seen. IMPRESSION: Changes of CHF. Electronically Signed   By: Alcide CleverMark  Lukens M.D.   On: 05/05/2017 13:54  [2 weeks]   Assessment:  77 year old female admitted with afib RVR,developing N/V during admission.TEEwith cardioversion 1/7 successful. Patient did have intermittent hypoxia during procedure.   Complains of still being unable to drink. "It comes right back up". Nursing states she was "spitting up" phlegm. She has been hesitant to attempt eating.   Patient complains of worsening breathing this morning. Last CXR four days again with CHF. Discussed with Lequita HaltMorgan (nursing), advised to address with attending.  Had planned on EGD +/- ED today but with INR up again at 2.2 and patient's decline in breathing, we will likely need to hold off. Discussed with nursing, aware of plans to recheck INR. I will add back Lovenox and diet pending results.    Plan: 1. Further management of respiratory issues by attending.   2. Recheck PT/INR now. INR bumped up. Last  dose of coumadin reported 05/07/16. No other known INR affecting anticoagulants per MAR. Likely hold off on EGD+/-ED until tomorrow, await results.  3. Continue supportive measures.    Leanna Battles. Dixon Boos [facility] [phone] 1/9/20199:21 AM      LOS: 8 days    Addendum: INR remains over 2. Will hold off on EGD today. No need for Lovenox given therapeutic range. Interestingly I cannot find where she has received coumadin since 05/07/16 per MAR.   Clear liquid diet, NPO after midnight. If stable from respiratory standpoint and INR below 1.8 tomorrow, will plan on EGD+/-ED with propofol. Reassess in AM.   Leanna Battles. Dixon Boos [facility] [phone] 1/9/201910:29 AM

## 2017-05-13 NOTE — Progress Notes (Signed)
Pharmacy Antibiotic Note  Ellen Woods is a 77 y.o. female admitted on 05/05/2017 with pneumonia.  [facility]  Plan: Levaquin 750mg  IV q48hrs (renally adjusted) Monitor labs, progress, c/s  Height: 4\' 11"  (149.9 cm) Weight: 123 lb 0.3 oz (55.8 kg) IBW/kg (Calculated) : 43.2  Temp (24hrs), Avg:98.9 F (37.2 C), Min:98.3 F (36.8 C), Max:99.5 F (37.5 C)  Recent Labs  Lab 05/08/17 0421 05/09/17 0438 05/10/17 0601 05/11/17 0615 05/12/17 0418 05/13/17 0428  WBC 7.2 10.1 7.9  --  8.3 9.8  CREATININE 1.42* 1.26* 1.48* 1.54* 1.58* 1.51*    Estimated Creatinine Clearance: 24.1 mL/min (A) (by C-G formula based on SCr of 1.51 mg/dL (H)).    Allergies  Allergen Reactions  . Codeine     REACTION: nausea  . Invokana [Canagliflozin]   . Naproxen Other (See Comments)    Abdominal pain  . Azithromycin Other (See Comments)    Hospital reaction   Antimicrobials this admission: Levaquin 1/9 >>   Dose adjustments this admission:  Microbiology results: 1/1:  MRSA PCR: NEGATIVE  Thank you for allowing pharmacy to be a part of this patient's care.  Valrie HartHall, Devlon Dosher A 05/13/2017 4:11 PM

## 2017-05-14 ENCOUNTER — Inpatient Hospital Stay (HOSPITAL_COMMUNITY): Payer: Medicare HMO

## 2017-05-14 DIAGNOSIS — J181 Lobar pneumonia, unspecified organism: Secondary | ICD-10-CM

## 2017-05-14 DIAGNOSIS — I5031 Acute diastolic (congestive) heart failure: Secondary | ICD-10-CM

## 2017-05-14 LAB — GLUCOSE, CAPILLARY
GLUCOSE-CAPILLARY: 126 mg/dL — AB (ref 65–99)
GLUCOSE-CAPILLARY: 105 mg/dL — AB (ref 65–99)
Glucose-Capillary: 416 mg/dL — ABNORMAL HIGH (ref 65–99)
Glucose-Capillary: 420 mg/dL — ABNORMAL HIGH (ref 65–99)
Glucose-Capillary: 333 mg/dL — ABNORMAL HIGH (ref 65–99)

## 2017-05-14 LAB — PROCALCITONIN: Procalcitonin: 0.46 ng/mL

## 2017-05-14 LAB — GLUCOSE, RANDOM: Glucose, Bld: 441 mg/dL — ABNORMAL HIGH (ref 65–99)

## 2017-05-14 LAB — PROTIME-INR
INR: 1.93
Prothrombin Time: 21.9 seconds — ABNORMAL HIGH (ref 11.4–15.2)

## 2017-05-14 MED ORDER — ENOXAPARIN SODIUM 60 MG/0.6ML ~~LOC~~ SOLN
60.0000 mg | SUBCUTANEOUS | Status: DC
  Administered 2017-05-14: 60 mg via SUBCUTANEOUS
  Filled 2017-05-14: qty 0.6

## 2017-05-14 MED ORDER — GLYCERIN (LAXATIVE) 2.1 G RE SUPP
1.0000 | Freq: Once | RECTAL | Status: AC
  Administered 2017-05-14: 1 via RECTAL
  Filled 2017-05-14: qty 1

## 2017-05-14 MED ORDER — INSULIN ASPART 100 UNIT/ML ~~LOC~~ SOLN
8.0000 [IU] | Freq: Once | SUBCUTANEOUS | Status: AC
  Administered 2017-05-14: 8 [IU] via SUBCUTANEOUS

## 2017-05-14 MED ORDER — AMLODIPINE BESYLATE 5 MG PO TABS
2.5000 mg | ORAL_TABLET | Freq: Every day | ORAL | Status: DC
  Administered 2017-05-15 – 2017-05-25 (×11): 2.5 mg via ORAL
  Filled 2017-05-14 (×12): qty 1

## 2017-05-14 NOTE — Progress Notes (Addendum)
Subjective:  Reports breathing about the same.  She has been coughing but no productive sputum per patient.  Continues to have some vomiting per patient but feels like it has improved.  She was able to tolerate nutritional supplements and some liquids yesterday.  No abdominal pain.  Reportedly no bowel movement this admission, has received Linzess for several days.   Objective: Vital signs in last 24 hours: Temp:  [98.2 F (36.8 C)-98.5 F (36.9 C)] 98.5 F (36.9 C) (01/10 0754) Pulse Rate:  [72-77] 77 (01/10 0754) Resp:  [14-22] 22 (01/10 0754) BP: (106-170)/(48-87) 144/87 (01/10 0754) SpO2:  [90 %-93 %] 90 % (01/10 0754) Last BM Date: 05/05/17(per patient) General:   Alert,  Well-developed, well-nourished, pleasant and cooperative in NAD Head:  Normocephalic and atraumatic. Eyes:  Sclera clear, no icterus.  Chest: Expiratory wheezing noted specifically right lung.  Crackles in both bases.      Heart:  Regular rate and rhythm; no murmurs, clicks, rubs,  or gallops. Abdomen:  Soft, nontender and nondistended. Normal bowel sounds, without guarding, and without rebound.   Extremities:  Without clubbing, deformity or edema. Neurologic:  Alert and  oriented x4;  grossly normal neurologically. Skin:  Intact without significant lesions or rashes. Psych:  Alert and cooperative. Normal mood and affect.  Intake/Output from previous day: 01/09 0701 - 01/10 0700 In: 1110 [P.O.:960; IV Piggyback:150] Out: 700 [Urine:700] Intake/Output this shift: No intake/output data recorded.  Lab Results: CBC Recent Labs    05/12/17 0418 05/13/17 0428  WBC 8.3 9.8  HGB 11.2* 10.7*  HCT 36.2 34.8*  MCV 99.7 100.0  PLT 223 254   BMET Recent Labs    05/12/17 0418 05/13/17 0428  NA 137 141  K 3.7 3.5  CL 99* 102  CO2 24 26  GLUCOSE 175* 133*  BUN 33* 34*  CREATININE 1.58* 1.51*  CALCIUM 8.0* 7.9*   LFTs Recent Labs    05/13/17 0428  BILITOT 0.9  ALKPHOS 64  AST 24  ALT 21   PROT 6.2*  ALBUMIN 2.8*   No results for input(s): LIPASE in the last 72 hours. PT/INR Recent Labs    05/13/17 0428 05/13/17 0934 05/14/17 0409  LABPROT 24.3* 22.9* 21.9*  INR 2.21 2.05 1.93      Imaging Studies: Dg Chest 2 View  Result Date: 05/13/2017 CLINICAL DATA:  Shortness of Breath EXAM: CHEST  2 VIEW COMPARISON:  May 09, 2017 FINDINGS: There is stable cardiomegaly with mild pulmonary venous hypertension. There is aortic atherosclerosis. There is a loculated right pleural effusion with a somewhat equivocal left pleural effusion. There is patchy airspace opacity bilaterally, overall increased from recent study. There does not appear to be appreciable interstitial edema. No adenopathy evident.  No bone lesions. IMPRESSION: There is evidence of underlying pulmonary vascular congestion. Opacification bilaterally has an appearance more suggestive of multifocal pneumonia than pulmonary edema, although pulmonary edema could present in this manner. Both pulmonary edema and pneumonia may present concurrently. There is aortic atherosclerosis. No adenopathy evident. Partially loculated right pleural effusion with equivocal left pleural effusion noted. Aortic Atherosclerosis (ICD10-I70.0). Electronically Signed   By: Bretta BangWilliam  Woodruff III M.D.   On: 05/13/2017 11:39   Dg Chest Port 1 View  Result Date: 05/09/2017 CLINICAL DATA:  Injured fibrillation, cough, fever EXAM: PORTABLE CHEST 1 VIEW COMPARISON:  05/05/2017 FINDINGS: Bilateral diffuse mild interstitial thickening, right greater than left. Patchy right lung alveolar airspace opacities. Small right pleural effusion. Small left pleural  effusion. No pneumothorax. Stable cardiomegaly. No acute osseous abnormality. IMPRESSION: Persistent CHF without significant interval change compared with 05/05/2017. Electronically Signed   By: Elige Ko   On: 05/09/2017 14:00   Dg Chest Port 1 View  Result Date: 05/05/2017 CLINICAL DATA:  Chest pain  EXAM: PORTABLE CHEST 1 VIEW COMPARISON:  10/12/2016 FINDINGS: Cardiac shadow remains enlarged. Aortic calcifications are noted. Vascular congestion with interstitial edema and bilateral pleural effusions right greater than left is noted. No bony abnormality is seen. IMPRESSION: Changes of CHF. Electronically Signed   By: Alcide Clever M.D.   On: 05/05/2017 13:54  [2 weeks]   Assessment:  77 year old female admitted with afib RVR,developing N/V during admission.TEEwith cardioversion 1/7 successful. Patient did have intermittent hypoxia during procedure.  Complains of persistent nausea and vomiting although feels like it has improved.  Does not like getting sugary drinks.  Prefers diet.  Tolerated some liquids yesterday.     Patient complains of worsening breathing from baseline although stable since yesterday.  Complains of nonproductive cough.  Chest x-ray yesterday concerning for pneumonia, started on Levaquin.  O2 saturations consistently in the low 90's this admission.   EGD +/- ED has been on hold due to elevated INR and patient's decline in respiratory status.    No BM in at least 8-9 days per patient.  Has been on Linzess without results.  Plan: 1. Glycerin suppository along with examination for fecal impaction 2. Discussed with Dr. Jena Gauss. Plans to move forward with non-invasive approach. UGI/BPE today.  3. I will give her dose of Lovenox today given subtherapeutic INR. If UGI/BPE abnormal, she may require EGD therefore recommend to hold on coumadin.   Leanna Battles. Dixon Boos [facility] [phone] 1/10/201910:09 AM  Upper GI series reviewed. Pill hung at the GE junction. Diffuse gastric and esophageal dysmotility. Would likely benefit from EGD with esophageal dilation.  The risks, benefits, limitations, alternatives and imponderables have been reviewed with the patient. Potential for esophageal dilation, biopsy, etc. have also been reviewed.   Questions have been answered. All parties agreeable.  Will tentatively plan for tomorrow pending results of INR. Further recommendations to follow.     LOS: 9 days

## 2017-05-14 NOTE — Progress Notes (Addendum)
PROGRESS NOTE    ALEXSANDRA SHONTZ   JWJ:[id number]  DOB: 1940-08-13  DOA: 05/05/2017 PCP: Dettinger, Elige Radon, MD   Brief Narrative:  [name] is a 77 y.o. female with medical history significant of COPD, coronary artery disease, diabetes mellitus type 2, dyspnea on exertion who presented to the emergency department with palpitations and facial swelling. In the ER she was found to have A-fib with RVR and pulmonary edema.  Subjective: Breathing is maybe a little better. No cough. No nausea or vomiting.    Assessment & Plan:   Principal Problem:   Atrial fibrillation with RVR - underwent TEE cardioversion on 1/8 - CHA2DS2-VASc Score 5 - being managed by cardiology with Amiodarone and Lopressor - has been on Coumadin but this is on hold as she needs an EGD  Active Problems: Nausea/ vomiting - occasional dysphagia and regurgitation of food per daughter - GI following and EGD planned when INR is subtherapeutic- upper GI series ordered today by GI- this shows a narrowing at the GE junction and possible GOO vs gastroparesis - she has needed esophageal dilation about 15 yrs ago - on Reglan, Phenergan, Protonix  Dyspnea/ acute resp distress- due to acute d CHF and or b/l lobar pneumonia - 1/9-  RR is in 20s on my exam and there are crackles at bases -  - per patient, she has had a cough for about 1 wk - CXR obtained today- is not clear on whether this is pneumonia or CHF- obtain pro calcitonin and BNP - ECHO does not suggest any underlying CHF but she may have d CHF - started on lasix and Levaquin  - 1/10- mild improvement noted- she states cough is gone but she is still hypoxic- cont to diurese and treat with Levaquin and attempt to wean O2  Severe constipation - Dulcolax was ineffective yesterday - GI has given a Glycerine suppository today- she is already on Linzess    Chronic diastolic heart failure  - see above    Diabetes type 2, uncontrolled - cont  insulin and follow    Hypothyroidism - Synthroid     DVT prophylaxis: SCDs Code Status: Full code Family Communication: daughter Disposition Plan:  Consultants:   Cardiology  GI Procedures:  2 D ECHO Left ventricle: The cavity size was normal. Wall thickness was   normal. Systolic function was normal. The estimated ejection   fraction was in the range of 60% to 65%. Wall motion was normal;   there were no regional wall motion abnormalities. - Aortic valve: Mildly calcified annulus. Trileaflet; normal   thickness leaflets. Valve area (VTI): 1.87 cm^2. - Mitral valve: There was mild regurgitation. - Left atrium: The atrium was moderately dilated. - Right atrium: The atrium was mildly dilated. - Atrial septum: No defect or patent foramen ovale was identified. - Pulmonary arteries: Systolic pressure was mildly to moderately   increased. PA peak pressure: 40 mm Hg (S).  TEE cardioversion  Antimicrobials:  Anti-infectives (From admission, onward)   Start     Dose/Rate Route Frequency Ordered Stop   05/13/17 1800  levofloxacin (LEVAQUIN) IVPB 750 mg     750 mg 100 mL/hr over 90 Minutes Intravenous Every 48 hours 05/13/17 1610         Objective: Vitals:   05/13/17 1500 05/13/17 2128 05/14/17 0648 05/14/17 0754  BP: 130/69 (!) 106/48 (!) 170/79 (!) 144/87  Pulse: 72 73 75 77  Resp: 18 14 17  (!) 22  Temp:  98.3 F (36.8 C) 98.2 F (36.8 C) 98.2 F (36.8 C) 98.5 F (36.9 C)  TempSrc: Oral Oral Oral Oral  SpO2: 93% 92% 92% 90%  Weight:      Height:        Intake/Output Summary (Last 24 hours) at 05/14/2017 1423 Last data filed at 05/13/2017 2045 Gross per 24 hour  Intake 630 ml  Output 400 ml  Net 230 ml   Filed Weights   05/08/17 0500 05/09/17 0400 05/12/17 0500  Weight: 56 kg (123 lb 7.3 oz) 55.6 kg (122 lb 9.2 oz) 55.8 kg (123 lb 0.3 oz)    Examination: General exam: Appears comfortable  HEENT: PERRLA, oral mucosa moist, no sclera icterus or  thrush Respiratory system: persistent crackles at bases-  - -pulse ox 90% on 3 L Cardiovascular system: S1 & S2 heard, RRR.  No murmurs  Gastrointestinal system: Abdomen soft, non-tender, nondistended. Normal bowel sound. No organomegaly Central nervous system: Alert and oriented. No focal neurological deficits. Extremities: No cyanosis, clubbing or edema Skin: No rashes or ulcers Psychiatry:  Mood & affect appropriate.     Data Reviewed: I have personally reviewed following labs and imaging studies  CBC: Recent Labs  Lab 05/08/17 0421 05/09/17 0438 05/10/17 0601 05/12/17 0418 05/13/17 0428  WBC 7.2 10.1 7.9 8.3 9.8  NEUTROABS 5.8 9.0* 7.0  --   --   HGB 10.8* 12.3 10.7* 11.2* 10.7*  HCT 34.5* 40.7 33.8* 36.2 34.8*  MCV 100.6* 101.8* 101.2* 99.7 100.0  PLT 218 262 211 223 254   Basic Metabolic Panel: Recent Labs  Lab 05/08/17 0421 05/09/17 0438 05/10/17 0601 05/11/17 0615 05/12/17 0418 05/13/17 0428 05/14/17 0830  NA 141 145 138 138 137 141  --   K 3.5 3.5 4.6 3.9 3.7 3.5  --   CL 104 105 101 100* 99* 102  --   CO2 24 29 27 27 24 26   --   GLUCOSE 81 80 371* 166* 175* 133* 441*  BUN 35* 24* 33* 34* 33* 34*  --   CREATININE 1.42* 1.26* 1.48* 1.54* 1.58* 1.51*  --   CALCIUM 8.0* 8.7* 8.2* 8.2* 8.0* 7.9*  --   MG 2.0 2.1 2.1  --   --   --   --    GFR: Estimated Creatinine Clearance: 24.1 mL/min (A) (by C-G formula based on SCr of 1.51 mg/dL (H)). Liver Function Tests: Recent Labs  Lab 05/08/17 0421 05/09/17 0438 05/10/17 0601 05/11/17 0615 05/13/17 0428  AST 20 31 28 27 24   ALT 30 29 27 24 21   ALKPHOS 69 80 72 65 64  BILITOT 0.8 0.8 0.5 0.5 0.9  PROT 5.8* 7.1 5.9* 6.0* 6.2*  ALBUMIN 3.1* 3.6 2.9* 2.9* 2.8*   No results for input(s): LIPASE, AMYLASE in the last 168 hours. No results for input(s): AMMONIA in the last 168 hours. Coagulation Profile: Recent Labs  Lab 05/11/17 0615 05/12/17 0418 05/13/17 0428 05/13/17 0934 05/14/17 0409  INR 1.83 1.82  2.21 2.05 1.93   Cardiac Enzymes: No results for input(s): CKTOTAL, CKMB, CKMBINDEX, TROPONINI in the last 168 hours. BNP (last 3 results) No results for input(s): PROBNP in the last 8760 hours. HbA1C: No results for input(s): HGBA1C in the last 72 hours. CBG: Recent Labs  Lab 05/13/17 1626 05/13/17 2124 05/14/17 0728 05/14/17 0754 05/14/17 1111  GLUCAP 292* 157* 416* 420* 333*   Lipid Profile: No results for input(s): CHOL, HDL, LDLCALC, TRIG, CHOLHDL, LDLDIRECT in the last 72 hours. Thyroid  Function Tests: No results for input(s): TSH, T4TOTAL, FREET4, T3FREE, THYROIDAB in the last 72 hours. Anemia Panel: No results for input(s): VITAMINB12, FOLATE, FERRITIN, TIBC, IRON, RETICCTPCT in the last 72 hours. Urine analysis:    Component Value Date/Time   COLORURINE YELLOW 03/14/2014 1227   APPEARANCEUR CLEAR 03/14/2014 1227   LABSPEC 1.015 03/14/2014 1227   PHURINE 5.0 03/14/2014 1227   GLUCOSEU 100 (A) 03/14/2014 1227   HGBUR NEGATIVE 03/14/2014 1227   BILIRUBINUR neg 06/13/2015 1710   KETONESUR NEGATIVE 03/14/2014 1227   PROTEINUR neg 06/13/2015 1710   PROTEINUR NEGATIVE 03/14/2014 1227   UROBILINOGEN negative 06/13/2015 1710   UROBILINOGEN 0.2 03/14/2014 1227   NITRITE neg 06/13/2015 1710   NITRITE NEGATIVE 03/14/2014 1227   LEUKOCYTESUR Negative 06/13/2015 1710   Sepsis Labs: @LABRCNTIP (procalcitonin:4,lacticidven:4) ) Recent Results (from the past 240 hour(s))  MRSA PCR Screening     Status: None   Collection Time: 05/05/17  9:20 PM  Result Value Ref Range Status   MRSA by PCR NEGATIVE NEGATIVE Final    Comment:        The GeneXpert MRSA Assay (FDA approved for NASAL specimens only), is one component of a comprehensive MRSA colonization surveillance program. It is not intended to diagnose MRSA infection nor to guide or monitor treatment for MRSA infections.          Radiology Studies: Dg Chest 2 View  Result Date: 05/13/2017 CLINICAL DATA:   Shortness of Breath EXAM: CHEST  2 VIEW COMPARISON:  May 09, 2017 FINDINGS: There is stable cardiomegaly with mild pulmonary venous hypertension. There is aortic atherosclerosis. There is a loculated right pleural effusion with a somewhat equivocal left pleural effusion. There is patchy airspace opacity bilaterally, overall increased from recent study. There does not appear to be appreciable interstitial edema. No adenopathy evident.  No bone lesions. IMPRESSION: There is evidence of underlying pulmonary vascular congestion. Opacification bilaterally has an appearance more suggestive of multifocal pneumonia than pulmonary edema, although pulmonary edema could present in this manner. Both pulmonary edema and pneumonia may present concurrently. There is aortic atherosclerosis. No adenopathy evident. Partially loculated right pleural effusion with equivocal left pleural effusion noted. Aortic Atherosclerosis (ICD10-I70.0). Electronically Signed   By: Bretta Bang III M.D.   On: 05/13/2017 11:39   Dg Ugi W/high Density W/kub  Result Date: 05/14/2017 CLINICAL DATA:  Dysphagia, feels like something is stuck in her throat when she tries to eat, nausea, vomiting; history diabetes mellitus, COPD, coronary artery disease, essential hypertension EXAM: UPPER GI SERIES WITH KUB TECHNIQUE: After obtaining a scout radiograph a routine upper GI series was performed using thin and high density barium. Patient also swallowed a 12.5 mm diameter barium tablet FLUOROSCOPY TIME:  Fluoroscopy Time:  3 minutes 0 seconds Radiation Exposure Index (if provided by the fluoroscopic device): 55.2 mGy Number of Acquired Spot Images: 3 plus multiple fluoroscopic screen captures COMPARISON:  None FINDINGS: Normal bowel gas pattern on scout image. Diffuse osseous demineralization with orthopedic hardware proximal LEFT femur. Scattered atherosclerotic calcifications. Calcified granulomata within spleen. RIGHT upper quadrant calcification  likely a 10 mm diameter gallstone. Diffuse impairment of esophageal motility with incomplete clearance of barium by primary peristaltic waves. Prolonged thoracic retention of contrast in the thoracic esophagus. Numerous secondary and tertiary waves. 12.5 mm diameter barium tablet obstructed at the gastroesophageal junction and could not pass into the stomach. Smooth appearance of esophageal walls without irregularity or ulceration. Stomach distends normally. Atrophic rugal folds. No discrete gastric mass or ulceration. Small  to moderate-sized hiatal hernia. Slow emptying of contrast from the stomach. Duodenal bulb and sweep grossly normal appearance. Incidentally noted diverticula at the second and third portions of the duodenum. Visualized proximal jejunum unremarkable. IMPRESSION: Significant diffuse impairment of esophageal motility. Stricture at the gastroesophageal junction which obstructed the 12.5 mm diameter barium tablet. Small to moderate-sized hiatal hernia. Incidentally noted duodenal diverticula. Slow emptying of contrast from the stomach without outlet obstruction, question component of diabetic gastroparesis. Electronically Signed   By: Ulyses Southward M.D.   On: 05/14/2017 14:18      Scheduled Meds: . amiodarone  200 mg Oral BID  . amLODipine  2.5 mg Oral Daily  . dextrose  25 mL Intravenous Once  . DULoxetine  30 mg Oral Daily  . enoxaparin (LOVENOX) injection  60 mg Subcutaneous Q24H  . feeding supplement (GLUCERNA SHAKE)  237 mL Oral TID BM  . furosemide  40 mg Intravenous BID  . insulin aspart  0-9 Units Subcutaneous TID WC  . insulin aspart  2 Units Subcutaneous TID WC  . insulin glargine  6 Units Subcutaneous Daily  . levothyroxine  50 mcg Oral QAC breakfast  . linaclotide  290 mcg Oral QAC breakfast  . metoCLOPramide (REGLAN) injection  10 mg Intravenous TID AC  . metoprolol tartrate  50 mg Oral BID  . pantoprazole (PROTONIX) IV  40 mg Intravenous BID AC  . rosuvastatin  5 mg  Oral Daily  . sodium chloride flush  3 mL Intravenous Q12H   Continuous Infusions: . sodium chloride    . levofloxacin (LEVAQUIN) IV Stopped (05/13/17 1829)     LOS: 9 days    Time spent in minutes: 45    Calvert Cantor, MD [facility] Pager: [email or web address] Password [email or web address] 05/14/2017, 2:23 PM

## 2017-05-14 NOTE — Progress Notes (Signed)
Progress Note  Patient Name: Ellen HaverGlenda J Woods Date of Encounter: 05/14/2017  Primary Cardiologist: Dr. Antoine PocheHochrein  Subjective   Has been NPO since midnight for GI study. Having some nausea. Denies any chest pain or palpitations. Breathing slightly improved.   Inpatient Medications    Scheduled Meds: . amiodarone  200 mg Oral BID  . dextrose  25 mL Intravenous Once  . DULoxetine  30 mg Oral Daily  . enoxaparin (LOVENOX) injection  60 mg Subcutaneous Q24H  . feeding supplement (GLUCERNA SHAKE)  237 mL Oral TID BM  . furosemide  40 mg Intravenous BID  . insulin aspart  0-9 Units Subcutaneous TID WC  . insulin aspart  2 Units Subcutaneous TID WC  . insulin glargine  6 Units Subcutaneous Daily  . levothyroxine  50 mcg Oral QAC breakfast  . linaclotide  290 mcg Oral QAC breakfast  . metoCLOPramide (REGLAN) injection  10 mg Intravenous TID AC  . metoprolol tartrate  50 mg Oral BID  . pantoprazole (PROTONIX) IV  40 mg Intravenous BID AC  . rosuvastatin  5 mg Oral Daily  . sodium chloride flush  3 mL Intravenous Q12H   Continuous Infusions: . sodium chloride    . levofloxacin (LEVAQUIN) IV Stopped (05/13/17 1829)   PRN Meds: acetaminophen, ALPRAZolam, alum & mag hydroxide-simeth, HYDROcodone-acetaminophen, promethazine, sodium chloride, sodium chloride flush   Vital Signs    Vitals:   05/13/17 1500 05/13/17 2128 05/14/17 0648 05/14/17 0754  BP: 130/69 (!) 106/48 (!) 170/79 (!) 144/87  Pulse: 72 73 75 77  Resp: 18 14 17  (!) 22  Temp: 98.3 F (36.8 C) 98.2 F (36.8 C) 98.2 F (36.8 C) 98.5 F (36.9 C)  TempSrc: Oral Oral Oral Oral  SpO2: 93% 92% 92% 90%  Weight:      Height:        Intake/Output Summary (Last 24 hours) at 05/14/2017 1207 Last data filed at 05/13/2017 2045 Gross per 24 hour  Intake 870 ml  Output 700 ml  Net 170 ml   Filed Weights   05/08/17 0500 05/09/17 0400 05/12/17 0500  Weight: 123 lb 7.3 oz (56 kg) 122 lb 9.2 oz (55.6 kg) 123 lb 0.3 oz (55.8  kg)    Telemetry    NSR, HR in 60's -80's. Brief episode of atrial tach this morning lasting for only a few seconds. - Personally Reviewed  ECG    NSR, HR 82, with no acute ST or T-wave changes when compared to prior tracings.  - Personally Reviewed  Physical Exam   General: Well developed, well nourished Caucasian female appearing in no acute distress. Head: Normocephalic, atraumatic.  Neck: Supple without bruits, JVD at 8cm. Lungs:  Resp regular and unlabored, rales along bases bilaterally. Heart: RRR, S1, S2, no S3, S4, or murmur; no rub. Abdomen: Soft, non-tender, non-distended with normoactive bowel sounds. No hepatomegaly. No rebound/guarding. No obvious abdominal masses. Extremities: No clubbing, cyanosis, or edema. Distal pedal pulses are 2+ bilaterally. Neuro: Alert and oriented X 3. Moves all extremities spontaneously. Psych: Normal affect.  Labs    Chemistry Recent Labs  Lab 05/10/17 0601 05/11/17 0615 05/12/17 0418 05/13/17 0428 05/14/17 0830  NA 138 138 137 141  --   K 4.6 3.9 3.7 3.5  --   CL 101 100* 99* 102  --   CO2 27 27 24 26   --   GLUCOSE 371* 166* 175* 133* 441*  BUN 33* 34* 33* 34*  --   CREATININE 1.48* 1.54*  1.58* 1.51*  --   CALCIUM 8.2* 8.2* 8.0* 7.9*  --   PROT 5.9* 6.0*  --  6.2*  --   ALBUMIN 2.9* 2.9*  --  2.8*  --   AST 28 27  --  24  --   ALT 27 24  --  21  --   ALKPHOS 72 65  --  64  --   BILITOT 0.5 0.5  --  0.9  --   GFRNONAA 33* 32* 31* 32*  --   GFRAA 38* 37* 36* 38*  --   ANIONGAP 10 11 14 13   --      Hematology Recent Labs  Lab 05/10/17 0601 05/12/17 0418 05/13/17 0428  WBC 7.9 8.3 9.8  RBC 3.34* 3.63* 3.48*  HGB 10.7* 11.2* 10.7*  HCT 33.8* 36.2 34.8*  MCV 101.2* 99.7 100.0  MCH 32.0 30.9 30.7  MCHC 31.7 30.9 30.7  RDW 13.5 13.4 13.5  PLT 211 223 254    Cardiac EnzymesNo results for input(s): TROPONINI in the last 168 hours. No results for input(s): TROPIPOC in the last 168 hours.   BNP Recent Labs  Lab  05/13/17 0428  BNP 373.0*     DDimer No results for input(s): DDIMER in the last 168 hours.   Radiology    Dg Chest 2 View  Result Date: 05/13/2017 CLINICAL DATA:  Shortness of Breath EXAM: CHEST  2 VIEW COMPARISON:  May 09, 2017 FINDINGS: There is stable cardiomegaly with mild pulmonary venous hypertension. There is aortic atherosclerosis. There is a loculated right pleural effusion with a somewhat equivocal left pleural effusion. There is patchy airspace opacity bilaterally, overall increased from recent study. There does not appear to be appreciable interstitial edema. No adenopathy evident.  No bone lesions. IMPRESSION: There is evidence of underlying pulmonary vascular congestion. Opacification bilaterally has an appearance more suggestive of multifocal pneumonia than pulmonary edema, although pulmonary edema could present in this manner. Both pulmonary edema and pneumonia may present concurrently. There is aortic atherosclerosis. No adenopathy evident. Partially loculated right pleural effusion with equivocal left pleural effusion noted. Aortic Atherosclerosis (ICD10-I70.0). Electronically Signed   By: Bretta Bang III M.D.   On: 05/13/2017 11:39    Cardiac Studies   TEE: 05/11/2017 Study Conclusions  - Left ventricle: Systolic function was normal. The estimated   ejection fraction was in the range of 55% to 60%. - Descending aorta: The descending aorta had minor luminal   irregularities. - Mitral valve: There was trivial regurgitation. - Left atrium: The atrium was moderately to severely dilated. No   evidence of thrombus in the atrial cavity or appendage. Emptying   velocity was severely reduced. - Right atrium: The atrium was moderately dilated. No evidence of   thrombus in the atrial cavity or appendage. - Atrial septum: No defect or patent foramen ovale was identified. - Tricuspid valve: There was mild regurgitation.  Impressions:  - LVEF 55-60%. Moderate to  severe left atrial enlargement and   moderate right atrial enlargement. No obvious thrombus noted   within left or right atrial appendages. Emptying velocity of left   atrial appendage was severely reduced. Trivial mitral   regurgitation and mild tricuspid regurgitation. Minor luminal   irregularities in the descending aorta. Transgastric images were   not obtained, study was truncated due to patient having somewhat   tenuous pulmonary status with intermittent hypoxia.   Patient Profile     77 y.o. female with past medical history of CAD (nonobstructive CAD  by cath in 2012 with low-risk NST in 12/2016), HTN, HLD, Type 2 DM, and COPDwho is being followed by Cardiology for newly diagnosed atrial fibrillation. Now s/p TEE-guided DCCV on 05/11/2017.  Assessment & Plan    1.Newly documented Atrial Fibrillation with RVR - underwent TEE/DCCV on 05/11/2017 with successful conversion to NSR. Remains on Amiodarone 200mg  BID and Lopressor 50mg  BID. Maintaining NSR by telemetry.  - CHADSVASCscore of5 with INR 1.93 this AM. Coumadin has been held while undergoing GI workup. Receiving Lovenox bridge.   2.Recent nausea and emesis - GI following. Plan is for UGI/BPE today by review of notes.   3.History of nonobstructive CAD - nonobstructive CAD by cath in 2012 with low-risk NST in 12/2016.  - she denies any recent chest pain. No plans for further ischemic evaluation at this time.   4.Hyperlipidemia - remains on Crestor 5mg  daily.   5.Essential hypertension - BP has been variable at 106/48 - 170/87 within the past 24 hours. At 144/87 on most recent check. On Lisinopril and Norvasc as an outpatient. Lisinopril held in setting of AKI. Creatinine is trending down, at 1.51 today.   6. New-onset Dyspnea - rales along bases on examination. BNP at 373 on 05/13/2016 with CXR showing possible pulmonary edema vs. PNA. She has been started on IV Lasix 40mg  BID for diuresis and Levaquin for PNA. Follow  BMET closely. Was on PRN Lasix dosing PTA.    For questions or updates, please contact [facility] Please consult [email or web address] for contact info under Cardiology/STEMI.   Lorri Frederick , PA-C 12:07 PM 05/14/2017 Pager: [phone]   Attending note:  Patient seen and examined. Discussed case with Ms. Iran Ouch PA-C. Patient remains in sinus rhythm by telemetry which I personally reviewed. She is complaining of shortness of breath and her chest x-ray shows possible degree of pulmonary edema, rule out infiltrates. She was started on IV Lasix in addition to antibiotics by primary team. She continues on amiodarone at 200 mg twice daily, tolerating Lopressor increased to 50 mg twice daily yesterday. She is undergoing a GI workup for nausea and continues with Lovenox bridge while Coumadin is held. We will start back on Norvasc at low dose, continue to follow blood pressure trend. Follow-up urine output and creatinine in a.m.  Jonelle Sidle, M.D., F.A.C.C.

## 2017-05-14 NOTE — Care Management Note (Signed)
Case Management Note  Patient Details  Name: Ellen Woods MRN: [id number] Date of Birth: 04/15/1941  Subjective/Objective:            Being treated for uncontrolled a-fib, N/V and resp distress. Pt from home, lives with her daughter who is with her 24/7. Pt has a cane, walker and BSC pta. She does not have home o2 or neb machine. [facility] is agreeable, she would like to use [facility] as she used them in the past. She would like DME to come from them also if she needs home O2 at DC which is likely. Pt has transportation to appointments and gets her medications through the mail. She will be able to get any new Rx from local pharmacy and DC. Pt very dyspneic with conversation this morning.         Action/Plan: Anticipate DC home with HH. CM has offered to contact daughter to discuss DC plan. Pt does not feel it is needed a this time. CM will cont to follow.   Expected Discharge Date:      05/19/2017            Expected Discharge Plan:  Home w Home Health Services  In-House Referral:  NA  Discharge planning Services  CM Consult  Post Acute Care Choice:  Home Health Choice offered to:  Patient  HH Arranged:  PT, RN, Nurse's Aide HH Agency:  [facility]  Status of Service:  In process, will continue to follow  If discussed at Long Length of Stay Meetings, dates discussed:  05/14/2017  Additional Comments:  Ellen Metrohildress, Ellen Aultman Demske, RN 05/14/2017, 12:12 PM

## 2017-05-14 NOTE — Progress Notes (Signed)
PT Cancellation Note  Patient Details Name: Ellen HaverGlenda J Woods MRN: [id number] DOB: 09/14/1940   Cancelled Treatment:    Reason Eval/Treat Not Completed: Medical issues which prohibited therapy.  Recommended to hold physical therapy today by attending MD due to patient having difficulty breathing.   10:50 AM, 05/14/17 Ocie BobJames Haydn Hutsell, MPT Physical Therapist with [facility] [phone] office [phone] mobile phone

## 2017-05-15 ENCOUNTER — Encounter (HOSPITAL_COMMUNITY)
Admission: EM | Disposition: A | Discharge status: Skilled Nursing Facility | Payer: Self-pay | Source: Home / Self Care | Attending: Family Medicine

## 2017-05-15 ENCOUNTER — Encounter (HOSPITAL_COMMUNITY): Payer: Self-pay | Admitting: *Deleted

## 2017-05-15 DIAGNOSIS — R933 Abnormal findings on diagnostic imaging of other parts of digestive tract: Secondary | ICD-10-CM

## 2017-05-15 DIAGNOSIS — J9601 Acute respiratory failure with hypoxia: Secondary | ICD-10-CM

## 2017-05-15 DIAGNOSIS — K3189 Other diseases of stomach and duodenum: Secondary | ICD-10-CM

## 2017-05-15 HISTORY — PX: ESOPHAGEAL DILATION: SHX303

## 2017-05-15 HISTORY — PX: BIOPSY: SHX5522

## 2017-05-15 HISTORY — PX: ESOPHAGOGASTRODUODENOSCOPY: SHX5428

## 2017-05-15 LAB — BASIC METABOLIC PANEL
ANION GAP: 16 — AB (ref 5–15)
BUN: 54 mg/dL — AB (ref 6–20)
CO2: 28 mmol/L (ref 22–32)
Calcium: 8.2 mg/dL — ABNORMAL LOW (ref 8.9–10.3)
Chloride: 100 mmol/L — ABNORMAL LOW (ref 101–111)
Creatinine, Ser: 1.9 mg/dL — ABNORMAL HIGH (ref 0.44–1.00)
GFR calc Af Amer: 28 mL/min — ABNORMAL LOW (ref >60)
GFR, EST NON AFRICAN AMERICAN: 25 mL/min — AB (ref >60)
Glucose, Bld: 176 mg/dL — ABNORMAL HIGH (ref 65–99)
POTASSIUM: 2.8 mmol/L — AB (ref 3.5–5.1)
SODIUM: 144 mmol/L (ref 135–145)

## 2017-05-15 LAB — GLUCOSE, CAPILLARY
GLUCOSE-CAPILLARY: 201 mg/dL — AB (ref 65–99)
GLUCOSE-CAPILLARY: 100 mg/dL — AB (ref 65–99)
GLUCOSE-CAPILLARY: 106 mg/dL — AB (ref 65–99)
Glucose-Capillary: 159 mg/dL — ABNORMAL HIGH (ref 65–99)
Glucose-Capillary: 156 mg/dL — ABNORMAL HIGH (ref 65–99)
Glucose-Capillary: 88 mg/dL (ref 65–99)

## 2017-05-15 LAB — PROTIME-INR
INR: 1.96
Prothrombin Time: 22.2 seconds — ABNORMAL HIGH (ref 11.4–15.2)

## 2017-05-15 LAB — PROCALCITONIN: PROCALCITONIN: 0.55 ng/mL

## 2017-05-15 SURGERY — EGD (ESOPHAGOGASTRODUODENOSCOPY)
Anesthesia: Moderate Sedation

## 2017-05-15 MED ORDER — LIDOCAINE VISCOUS 2 % MT SOLN
OROMUCOSAL | Status: AC
  Filled 2017-05-15: qty 15

## 2017-05-15 MED ORDER — PHYTONADIONE 5 MG PO TABS
2.5000 mg | ORAL_TABLET | Freq: Once | ORAL | Status: AC
  Administered 2017-05-15: 2.5 mg via ORAL
  Filled 2017-05-15: qty 1

## 2017-05-15 MED ORDER — METOCLOPRAMIDE HCL 5 MG/ML IJ SOLN
5.0000 mg | Freq: Three times a day (TID) | INTRAMUSCULAR | Status: DC
  Administered 2017-05-15 – 2017-05-25 (×30): 5 mg via INTRAVENOUS
  Filled 2017-05-15 (×30): qty 2

## 2017-05-15 MED ORDER — MIDAZOLAM HCL 5 MG/5ML IJ SOLN
INTRAMUSCULAR | Status: AC
  Filled 2017-05-15: qty 10

## 2017-05-15 MED ORDER — LIDOCAINE VISCOUS 2 % MT SOLN
OROMUCOSAL | Status: DC | PRN
  Administered 2017-05-15: 1 via OROMUCOSAL

## 2017-05-15 MED ORDER — SODIUM CHLORIDE 0.9 % IV SOLN
INTRAVENOUS | Status: DC
  Administered 2017-05-15: 1000 mL via INTRAVENOUS

## 2017-05-15 MED ORDER — RIVAROXABAN 15 MG PO TABS
15.0000 mg | ORAL_TABLET | Freq: Every day | ORAL | Status: DC
  Administered 2017-05-15 – 2017-05-17 (×3): 15 mg via ORAL
  Filled 2017-05-15 (×3): qty 1

## 2017-05-15 MED ORDER — MEPERIDINE HCL 100 MG/ML IJ SOLN
INTRAMUSCULAR | Status: DC | PRN
  Administered 2017-05-15 (×2): 25 mg via INTRAVENOUS

## 2017-05-15 MED ORDER — MIDAZOLAM HCL 5 MG/5ML IJ SOLN
INTRAMUSCULAR | Status: DC | PRN
  Administered 2017-05-15: 2 mg via INTRAVENOUS
  Administered 2017-05-15: 1 mg via INTRAVENOUS

## 2017-05-15 MED ORDER — LEVOFLOXACIN IN D5W 500 MG/100ML IV SOLN
500.0000 mg | INTRAVENOUS | Status: DC
  Administered 2017-05-15: 500 mg via INTRAVENOUS
  Filled 2017-05-15: qty 100

## 2017-05-15 MED ORDER — FUROSEMIDE 10 MG/ML IJ SOLN
40.0000 mg | Freq: Every day | INTRAMUSCULAR | Status: DC
  Administered 2017-05-16: 40 mg via INTRAVENOUS
  Filled 2017-05-15: qty 4

## 2017-05-15 MED ORDER — ONDANSETRON HCL 4 MG/2ML IJ SOLN
INTRAMUSCULAR | Status: DC | PRN
  Administered 2017-05-15: 4 mg via INTRAVENOUS

## 2017-05-15 MED ORDER — ONDANSETRON HCL 4 MG/2ML IJ SOLN
INTRAMUSCULAR | Status: AC
  Filled 2017-05-15: qty 2

## 2017-05-15 MED ORDER — MEPERIDINE HCL 100 MG/ML IJ SOLN
INTRAMUSCULAR | Status: AC
  Filled 2017-05-15: qty 2

## 2017-05-15 NOTE — Progress Notes (Signed)
Physical Therapy Treatment Patient Details Name: Ellen Woods MRN: [id number] DOB: 02/09/1941 Today's Date: 05/15/2017    History of Present Illness Ellen Woods is a 77 y.o. female with medical history significant of COPD, coronary artery disease, diabetes mellitus type 2, dyspnea on exertion who presented to the emergency department with palpitations and facial swelling.  Patient's daughter and son are bedside and states that this morning patient began to have swelling on one side of her face and it appeared as though her heart was "beating out of her chest".  They used an at home blood pressure monitor and stated her blood pressure was reasonably controlled but her heart rate was in the 150s.  They gave patient a Xanax and stated that her heart rate came down and the swelling improved.  She presented to the emergency department for further evaluation of her tachycardia.  Patient reports that she has chronic dyspnea on exertion and has told this to her cardiologist who she saw in October 2018 but states that no further workup was ordered for her.  She does mention that she has a history of coronary artery disease with upon reviewing the note from the cardiologist in October she had a negative Lexiscan Myoview.  At that time her ejection fraction was noted to be 78%.  Patient reports no recent sick contacts except for her son and daughter who had gastroenteritis.  She voices that she occasionally has vomiting but this she attributes to her esophagus requiring stretching which she states [place].  She denies fevers or chills, denies diarrhea or constipation, and denies chest pain.  She states that she thinks her palpitations began last night before she went to bed but she did not measure her heart rate at that time.  According to her problem list patient has a history of paroxysmal atrial fibrillation from 2013 however she cannot recall this incident.  There is a  cardiology note from June 2017 that voices that she had a Holter monitor that demonstrated premature atrial contractions and atrial tachycardia.  She does have a history of a PDA which is small and has been managed medically.    PT Comments    Patient demonstrates slow labored gait during gait training with O2 sats dropping to 79-80% while on 2 LPM and put back to bed after therapy - RN notified.  Patient will benefit from continued physical therapy in hospital and recommended venue below to increase strength, balance, endurance for safe ADLs and gait.   Follow Up Recommendations  Home health PT;Supervision/Assistance - 24 hour     Equipment Recommendations  None recommended by PT    Recommendations for Other Services       Precautions / Restrictions Precautions Precautions: Fall Restrictions Weight Bearing Restrictions: No    Mobility  Bed Mobility Overal bed mobility: Modified Independent Bed Mobility: Supine to Sit;Sit to Supine     Supine to sit: Supervision Sit to supine: Supervision      Transfers Overall transfer level: Needs assistance Equipment used: Straight cane Transfers: Sit to/from Stand Sit to Stand: Min guard            Ambulation/Gait Ambulation/Gait assistance: Min guard Ambulation Distance (Feet): 35 Feet Assistive device: Straight cane Gait Pattern/deviations: Decreased step length - right;Decreased step length - left;Decreased stride length   Gait velocity interpretation: Below normal speed for age/gender General Gait Details: unsteady gait with occasional leaning against wall, limited secondary to fatigue;  O2 sat dropped to 79-80%   Stairs            Wheelchair Mobility    Modified Rankin (Stroke Patients Only)       Balance Overall balance assessment: Needs assistance Sitting-balance support: No upper extremity supported;Feet unsupported Sitting balance-Leahy Scale: Good     Standing balance support: Single extremity  supported;During functional activity Standing balance-Leahy Scale: Fair Standing balance comment: fair with SPC                            Cognition Arousal/Alertness: Awake/alert Behavior During Therapy: WFL for tasks assessed/performed Overall Cognitive Status: Within Functional Limits for tasks assessed                                        Exercises General Exercises - Lower Extremity Ankle Circles/Pumps: Seated;AROM;Both;10 reps Long Arc Quad: Seated;AROM;Both;10 reps Hip Flexion/Marching: Seated;AROM;Both;10 reps    General Comments        Pertinent Vitals/Pain Pain Assessment: No/denies pain    Home Living                      Prior Function            PT Goals (current goals can now be found in the care plan section) Acute Rehab PT Goals Patient Stated Goal: return home with family to assist PT Goal Formulation: With patient/family Time For Goal Achievement: 05/27/17 Potential to Achieve Goals: Good Progress towards PT goals: Progressing toward goals    Frequency    Min 3X/week      PT Plan Current plan remains appropriate    Co-evaluation              AM-PAC PT "6 Clicks" Daily Activity  Outcome Measure  Difficulty turning over in bed (including adjusting bedclothes, sheets and blankets)?: None Difficulty moving from lying on back to sitting on the side of the bed? : None Difficulty sitting down on and standing up from a chair with arms (e.g., wheelchair, bedside commode, etc,.)?: A Little Help needed moving to and from a bed to chair (including a wheelchair)?: A Little Help needed walking in hospital room?: A Lot Help needed climbing 3-5 steps with a railing? : A Lot 6 Click Score: 16    End of Session Equipment Utilized During Treatment: Gait belt;Oxygen Activity Tolerance: Patient tolerated treatment well;Patient limited by fatigue Patient left: in bed;with call bell/phone within reach Nurse  Communication: Mobility status PT Visit Diagnosis: Unsteadiness on feet (R26.81);Other abnormalities of gait and mobility (R26.89);Muscle weakness (generalized) (M62.81)     Time: 1610-9604 PT Time Calculation (min) (ACUTE ONLY): 24 min  Charges:  $Therapeutic Exercise: 23-37 mins $Therapeutic Activity: 23-37 mins                    G Codes:       2:36 PM, 06/03/2017 Ocie Bob, MPT Physical Therapist with [facility] 336 [phone] office [phone] mobile phone

## 2017-05-15 NOTE — Op Note (Signed)
[facility] Patient Name: Ellen AschoffGlenda Woods Procedure Date: 05/15/2017 2:29 PM MRN: [id number] Date of Birth: 10/12/1940 Attending MD: Gennette Pacobert Michael Tkai Large , MD CSN: [id number] Age: 77 Admit Type: Inpatient Procedure:                Upper GI endoscopy Indications:              Dysphagia, Abnormal UGI series Providers:                Gennette Pacobert Michael Neasia Fleeman, MD, Nena PolioLisa Moore, RN, Dyann Ruddleonya                            Wilson Referring MD:              Medicines:                Midazolam 2 mg IV, Meperidine 50 mg IV Complications:            No immediate complications. Estimated Blood Loss:     Estimated blood loss was minimal. Procedure:                Pre-Anesthesia Assessment:                           - Prior to the procedure, a History and Physical                            was performed, and patient medications and                            allergies were reviewed. The patient's tolerance of                            previous anesthesia was also reviewed. The risks                            and benefits of the procedure and the sedation                            options and risks were discussed with the patient.                            All questions were answered, and informed consent                            was obtained. Prior Anticoagulants: The patient                            last took Coumadin (warfarin) 5 days and Lovenox                            (enoxaparin) 1 day prior to the procedure. ASA                            Grade Assessment: III - A patient with severe  systemic disease. After reviewing the risks and                            benefits, the patient was deemed in satisfactory                            condition to undergo the procedure.                           After obtaining informed consent, the endoscope was                            passed under direct vision. Throughout the                            procedure, the  patient's blood pressure, pulse, and                            oxygen saturations were monitored continuously. The                            [id number] ([id number]) scope was introduced through the                            mouth, and advanced to the second part of duodenum.                            The upper GI endoscopy was accomplished without                            difficulty. The patient tolerated the procedure                            well. Scope In: 3:04:27 PM Scope Out: 3:11:56 PM Total Procedure Duration: 0 hours 7 minutes 29 seconds  Findings:      Thin layer of barium adherent to the esophageal mucosa. Tubular       esophagus patent throughout its course, however, LES did have a       "elastic" feel. esophageal mucosa otherwise appeared normal. EG junction       traversed easily with the scope. Thin coating of barium gastric mucosa.       Scattered erythematous eroded areas. No ulcer or infiltrative process.       Pylorus patent.      Diffuse erythematous mucosa was found in the entire examined stomach.       This was biopsied with a cold forceps for histology. Estimated blood       loss was minimal.      The duodenal bulb and second portion of the duodenum were normal. The       scope was withdrawn. Dilation was performed with a Maloney dilator with       mild resistance at 54 Fr. The dilation site was examined following       endoscope reinsertion and showed no change. Estimated blood loss: none. Impression:               -  No mechanical esophageal obstruction. Suspect                            significant underlying dysmotility. Achalasia                            remains a possibility. Status post [facility] dilation                            empirically.                           - Erythematous / eroded gastric mucosa of uncertain                            significance?"status post biopsy.                           - Normal duodenal bulb and second portion of the                             duodenum. Moderate Sedation:      Moderate (conscious) sedation was administered by the endoscopy nurse       and supervised by the endoscopist. The following parameters were       monitored: oxygen saturation, heart rate, blood pressure, respiratory       rate, EKG, adequacy of pulmonary ventilation, and response to care.       Total physician intraservice time was 15 minutes. Recommendation:           - Patient has a contact number available for                            emergencies. The signs and symptoms of potential                            delayed complications were discussed with the                            patient.                           - Advance diet as tolerated (carbohydrate modified                            dysphagia 2).                           - Return patient to hospital ward for ongoing care.                            Will decrease IV Reglan to 5 mg before meals and at                            bedtime secondary to "jitteriness". Agree with  resuming Coumadin and DOAC therapy concomittantly                            as suggested by Dr. Diona Browner to hasten therapeutic                            anticoagulation.                           - Continue present medications.                           - No repeat upper endoscopy.                           - Return to GI office (date not yet determined). Procedure Code(s):        --- Professional ---                           [phone], Esophagogastroduodenoscopy, flexible,                            transoral; with biopsy, single or multiple                           43450, Dilation of esophagus, by unguided sound or                            bougie, single or multiple passes                           99152, Moderate sedation services provided by the                            same physician or other qualified health care                            professional  performing the diagnostic or                            therapeutic service that the sedation supports,                            requiring the presence of an independent trained                            observer to assist in the monitoring of the                            patient's level of consciousness and physiological                            status; initial 15 minutes of intraservice time,  patient age 77 years or older Diagnosis Code(s):        --- Professional ---                           K31.89, Other diseases of stomach and duodenum                           R13.10, Dysphagia, unspecified                           R93.3, Abnormal findings on diagnostic imaging of                            other parts of digestive tract CPT copyright 2016 [employer]. All rights reserved. The codes documented in this report are preliminary and upon coder review may  be revised to meet current compliance requirements. Gerrit Friends. Ebone Alcivar, MD Gennette Pac, MD 05/15/2017 3:49:37 PM This report has been signed electronically. Number of Addenda: 0

## 2017-05-15 NOTE — Care Management Important Message (Signed)
Important Message  Patient Details  Name: Ellen Woods MRN: [id number] Date of Birth: 05/16/1940   Medicare Important Message Given:  Yes    Malcolm MetroChildress, [name], RN 05/15/2017, 12:45 PM

## 2017-05-15 NOTE — Progress Notes (Signed)
SATURATION QUALIFICATIONS: (This note is used to comply with regulatory documentation for home oxygen)  Patient Saturations on Room Air at Rest = 86%  Patient Saturations on Room Air while Ambulating =N/A  Patient Saturations on 4 Liters of oxygen while Ambulating = 92%  Please briefly explain why patient needs home oxygen:

## 2017-05-15 NOTE — Progress Notes (Signed)
PROGRESS NOTE    Ellen Woods   ZOX:[id number]  DOB: 06-22-1940  DOA: 05/05/2017 PCP: Dettinger, Elige Radon, MD   Brief Narrative:  Ellen Woods is a 77 y.o. female with medical history significant of COPD, coronary artery disease, diabetes mellitus type 2, dyspnea on exertion who presented to the emergency department with palpitations and facial swelling. In the ER she was found to have A-fib with RVR and pulmonary edema.  Subjective: No complaints of cough or dyspnea. No nausea, vomiting or abdominal pain. She continues to be constipated stating that she has had only a small amount of stool since the Glycerin suppository was given.    Assessment & Plan:   Principal Problem:   Atrial fibrillation with RVR - underwent TEE cardioversion on 1/8 - CHA2DS2-VASc Score 5 - being managed by cardiology with Amiodarone and Lopressor - has been on Coumadin but this is on hold as she needs an EGD - will need to resume NOAC/ Coumadin later today  Active Problems: Nausea/ vomiting- esophageal stricture - occasional dysphagia and regurgitation of food per daughter - GI following and EGD planned when INR is subtherapeutic- upper GI series ordered today by GI- this shows a narrowing at the GE junction and possible GOO vs gastroparesis - she needed esophageal dilation about 15 yrs ago - on Reglan, Phenergan, Protonix - will go for EGD today  Dyspnea/ acute resp distress- due to acute d CHF and or b/l lobar pneumonia - 1/9-  RR is in 20s on my exam and there are crackles at bases -  - per patient, she has had a cough for about 1 wk - CXR obtained today- is not clear on whether this is pneumonia or CHF- obtain pro calcitonin and BNP - ECHO does not suggest any underlying CHF but she may have d CHF - started on lasix and Levaquin  - 1/10- mild improvement noted- she states cough is gone but she is still hypoxic- cont to diurese and treat with Levaquin and attempt to wean  O2 1/11- symptoms appear to be improving- she is not dyspneic today- wean O2- cont diuretics and antibiotics- repeat CXR tomorrow AM  Severe constipation  - on Linzess - not much stool output after suppositories- she has not had much to eat in the hospital and may not have much stool to pass- follow    Chronic diastolic heart failure  - see above    Diabetes type 2, uncontrolled - cont insulin and follow    Hypothyroidism - Synthroid     DVT prophylaxis: SCDs Code Status: Full code Family Communication: daughter Disposition Plan:  Consultants:   Cardiology  GI Procedures:  2 D ECHO Left ventricle: The cavity size was normal. Wall thickness was   normal. Systolic function was normal. The estimated ejection   fraction was in the range of 60% to 65%. Wall motion was normal;   there were no regional wall motion abnormalities. - Aortic valve: Mildly calcified annulus. Trileaflet; normal   thickness leaflets. Valve area (VTI): 1.87 cm^2. - Mitral valve: There was mild regurgitation. - Left atrium: The atrium was moderately dilated. - Right atrium: The atrium was mildly dilated. - Atrial septum: No defect or patent foramen ovale was identified. - Pulmonary arteries: Systolic pressure was mildly to moderately   increased. PA peak pressure: 40 mm Hg (S).  TEE cardioversion  Antimicrobials:  Anti-infectives (From admission, onward)   Start     Dose/Rate Route Frequency Ordered Stop  05/13/17 1800  levofloxacin (LEVAQUIN) IVPB 750 mg     750 mg 100 mL/hr over 90 Minutes Intravenous Every 48 hours 05/13/17 1610         Objective: Vitals:   05/14/17 1400 05/14/17 2128 05/15/17 0644 05/15/17 0802  BP: (!) 137/44 (!) 149/55 (!) 162/67   Pulse: 70 73 68   Resp: 20 16 17    Temp: 97.7 F (36.5 C) 98.5 F (36.9 C) 98.5 F (36.9 C)   TempSrc: Oral Oral Oral   SpO2: 90% 90% 92%   Weight:    55.2 kg (121 lb 12.8 oz)  Height:        Intake/Output Summary (Last 24  hours) at 05/15/2017 1006 Last data filed at 05/15/2017 0800 Gross per 24 hour  Intake 0 ml  Output -  Net 0 ml   Filed Weights   05/09/17 0400 05/12/17 0500 05/15/17 0802  Weight: 55.6 kg (122 lb 9.2 oz) 55.8 kg (123 lb 0.3 oz) 55.2 kg (121 lb 12.8 oz)    Examination: General exam: Appears comfortable  HEENT: PERRLA, oral mucosa moist, no sclera icterus or thrush Respiratory system: persistent crackles at right base today- normal resp effort Cardiovascular system: S1 & S2 heard, RRR.  No murmurs  Gastrointestinal system: Abdomen soft, non-tender, nondistended. Normal bowel sound. No organomegaly Central nervous system: Alert and oriented. No focal neurological deficits. Extremities: No cyanosis, clubbing or edema Skin: No rashes or ulcers Psychiatry:  Mood & affect appropriate.     Data Reviewed: I have personally reviewed following labs and imaging studies  CBC: Recent Labs  Lab 05/09/17 0438 05/10/17 0601 05/12/17 0418 05/13/17 0428  WBC 10.1 7.9 8.3 9.8  NEUTROABS 9.0* 7.0  --   --   HGB 12.3 10.7* 11.2* 10.7*  HCT 40.7 33.8* 36.2 34.8*  MCV 101.8* 101.2* 99.7 100.0  PLT 262 211 223 254   Basic Metabolic Panel: Recent Labs  Lab 05/09/17 0438 05/10/17 0601 05/11/17 0615 05/12/17 0418 05/13/17 0428 05/14/17 0830  NA 145 138 138 137 141  --   K 3.5 4.6 3.9 3.7 3.5  --   CL 105 101 100* 99* 102  --   CO2 29 27 27 24 26   --   GLUCOSE 80 371* 166* 175* 133* 441*  BUN 24* 33* 34* 33* 34*  --   CREATININE 1.26* 1.48* 1.54* 1.58* 1.51*  --   CALCIUM 8.7* 8.2* 8.2* 8.0* 7.9*  --   MG 2.1 2.1  --   --   --   --    GFR: Estimated Creatinine Clearance: 24 mL/min (A) (by C-G formula based on SCr of 1.51 mg/dL (H)). Liver Function Tests: Recent Labs  Lab 05/09/17 0438 05/10/17 0601 05/11/17 0615 05/13/17 0428  AST 31 28 27 24   ALT 29 27 24 21   ALKPHOS 80 72 65 64  BILITOT 0.8 0.5 0.5 0.9  PROT 7.1 5.9* 6.0* 6.2*  ALBUMIN 3.6 2.9* 2.9* 2.8*   No results  for input(s): LIPASE, AMYLASE in the last 168 hours. No results for input(s): AMMONIA in the last 168 hours. Coagulation Profile: Recent Labs  Lab 05/12/17 0418 05/13/17 0428 05/13/17 0934 05/14/17 0409 05/15/17 0525  INR 1.82 2.21 2.05 1.93 1.96   Cardiac Enzymes: No results for input(s): CKTOTAL, CKMB, CKMBINDEX, TROPONINI in the last 168 hours. BNP (last 3 results) No results for input(s): PROBNP in the last 8760 hours. HbA1C: No results for input(s): HGBA1C in the last 72 hours. CBG: Recent Labs  Lab 05/14/17 1111 05/14/17 1622 05/14/17 2125 05/15/17 0319 05/15/17 0717  GLUCAP 333* 126* 105* 159* 201*   Lipid Profile: No results for input(s): CHOL, HDL, LDLCALC, TRIG, CHOLHDL, LDLDIRECT in the last 72 hours. Thyroid Function Tests: No results for input(s): TSH, T4TOTAL, FREET4, T3FREE, THYROIDAB in the last 72 hours. Anemia Panel: No results for input(s): VITAMINB12, FOLATE, FERRITIN, TIBC, IRON, RETICCTPCT in the last 72 hours. Urine analysis:    Component Value Date/Time   COLORURINE YELLOW 03/14/2014 1227   APPEARANCEUR CLEAR 03/14/2014 1227   LABSPEC 1.015 03/14/2014 1227   PHURINE 5.0 03/14/2014 1227   GLUCOSEU 100 (A) 03/14/2014 1227   HGBUR NEGATIVE 03/14/2014 1227   BILIRUBINUR neg 06/13/2015 1710   KETONESUR NEGATIVE 03/14/2014 1227   PROTEINUR neg 06/13/2015 1710   PROTEINUR NEGATIVE 03/14/2014 1227   UROBILINOGEN negative 06/13/2015 1710   UROBILINOGEN 0.2 03/14/2014 1227   NITRITE neg 06/13/2015 1710   NITRITE NEGATIVE 03/14/2014 1227   LEUKOCYTESUR Negative 06/13/2015 1710   Sepsis Labs: @LABRCNTIP (procalcitonin:4,lacticidven:4) ) Recent Results (from the past 240 hour(s))  MRSA PCR Screening     Status: None   Collection Time: 05/05/17  9:20 PM  Result Value Ref Range Status   MRSA by PCR NEGATIVE NEGATIVE Final    Comment:        The GeneXpert MRSA Assay (FDA approved for NASAL specimens only), is one component of a comprehensive  MRSA colonization surveillance program. It is not intended to diagnose MRSA infection nor to guide or monitor treatment for MRSA infections.          Radiology Studies: Dg Chest 2 View  Result Date: 05/13/2017 CLINICAL DATA:  Shortness of Breath EXAM: CHEST  2 VIEW COMPARISON:  May 09, 2017 FINDINGS: There is stable cardiomegaly with mild pulmonary venous hypertension. There is aortic atherosclerosis. There is a loculated right pleural effusion with a somewhat equivocal left pleural effusion. There is patchy airspace opacity bilaterally, overall increased from recent study. There does not appear to be appreciable interstitial edema. No adenopathy evident.  No bone lesions. IMPRESSION: There is evidence of underlying pulmonary vascular congestion. Opacification bilaterally has an appearance more suggestive of multifocal pneumonia than pulmonary edema, although pulmonary edema could present in this manner. Both pulmonary edema and pneumonia may present concurrently. There is aortic atherosclerosis. No adenopathy evident. Partially loculated right pleural effusion with equivocal left pleural effusion noted. Aortic Atherosclerosis (ICD10-I70.0). Electronically Signed   By: Bretta BangWilliam  Woodruff III M.D.   On: 05/13/2017 11:39   Dg Ugi W/high Density W/kub  Result Date: 05/14/2017 CLINICAL DATA:  Dysphagia, feels like something is stuck in her throat when she tries to eat, nausea, vomiting; history diabetes mellitus, COPD, coronary artery disease, essential hypertension EXAM: UPPER GI SERIES WITH KUB TECHNIQUE: After obtaining a scout radiograph a routine upper GI series was performed using thin and high density barium. Patient also swallowed a 12.5 mm diameter barium tablet FLUOROSCOPY TIME:  Fluoroscopy Time:  3 minutes 0 seconds Radiation Exposure Index (if provided by the fluoroscopic device): 55.2 mGy Number of Acquired Spot Images: 3 plus multiple fluoroscopic screen captures COMPARISON:  None  FINDINGS: Normal bowel gas pattern on scout image. Diffuse osseous demineralization with orthopedic hardware proximal LEFT femur. Scattered atherosclerotic calcifications. Calcified granulomata within spleen. RIGHT upper quadrant calcification likely a 10 mm diameter gallstone. Diffuse impairment of esophageal motility with incomplete clearance of barium by primary peristaltic waves. Prolonged thoracic retention of contrast in the thoracic esophagus. Numerous secondary and tertiary waves. 12.5  mm diameter barium tablet obstructed at the gastroesophageal junction and could not pass into the stomach. Smooth appearance of esophageal walls without irregularity or ulceration. Stomach distends normally. Atrophic rugal folds. No discrete gastric mass or ulceration. Small to moderate-sized hiatal hernia. Slow emptying of contrast from the stomach. Duodenal bulb and sweep grossly normal appearance. Incidentally noted diverticula at the second and third portions of the duodenum. Visualized proximal jejunum unremarkable. IMPRESSION: Significant diffuse impairment of esophageal motility. Stricture at the gastroesophageal junction which obstructed the 12.5 mm diameter barium tablet. Small to moderate-sized hiatal hernia. Incidentally noted duodenal diverticula. Slow emptying of contrast from the stomach without outlet obstruction, question component of diabetic gastroparesis. Electronically Signed   By: Ulyses Southward M.D.   On: 05/14/2017 14:18      Scheduled Meds: . amiodarone  200 mg Oral BID  . amLODipine  2.5 mg Oral Daily  . dextrose  25 mL Intravenous Once  . DULoxetine  30 mg Oral Daily  . feeding supplement (GLUCERNA SHAKE)  237 mL Oral TID BM  . furosemide  40 mg Intravenous BID  . insulin aspart  0-9 Units Subcutaneous TID WC  . insulin aspart  2 Units Subcutaneous TID WC  . insulin glargine  6 Units Subcutaneous Daily  . levothyroxine  50 mcg Oral QAC breakfast  . linaclotide  290 mcg Oral QAC breakfast    . metoCLOPramide (REGLAN) injection  10 mg Intravenous TID AC  . metoprolol tartrate  50 mg Oral BID  . pantoprazole (PROTONIX) IV  40 mg Intravenous BID AC  . phytonadione  2.5 mg Oral Once  . rosuvastatin  5 mg Oral Daily  . sodium chloride flush  3 mL Intravenous Q12H   Continuous Infusions: . sodium chloride    . levofloxacin (LEVAQUIN) IV Stopped (05/13/17 1829)     LOS: 10 days    Time spent in minutes: 45    Calvert Cantor, MD [facility] Pager: [email or web address] Password [email or web address] 05/15/2017, 10:06 AM

## 2017-05-15 NOTE — Care Management Note (Addendum)
Case Management Note  Patient Details  Name: Ellen Woods MRN: [id number] Date of Birth: 09/04/1940  Expected Discharge Date:     05/16/2016             Expected Discharge Plan:  Home w Home Health Services  In-House Referral:  NA  Discharge planning Services  CM Consult  Post Acute Care Choice:  Home Health Choice offered to:  Patient  DME Arranged:  Oxygen DME Agency:  [facility]  HH Arranged:  PT, RN, Nurse's Aide HH Agency:  [facility]  Status of Service:  Completed, signed off  Additional Comments: Planning for DC home over weekend. Pt will need HH services, RW and home oxygen. Shaune LeeksJermaine Jenkins, [facility] rep, is aware of referral and will pull pt info from chart. He will deliver DME to pt room prior to DC. CM has discussed DC plan with daughter. She is aware HH has 48 hrs to make first visit. She reports pt has neb machine at home to use if she needs one. [facility]   Malcolm Metrohildress, Nova Schmuhl Demske, RN 05/15/2017, 12:40 PM

## 2017-05-15 NOTE — Progress Notes (Signed)
Progress Note  Patient Name: Ellen Woods Date of Encounter: 05/15/2017  Primary Cardiologist: Dr. Rollene RotundaJames Hochrein  Subjective   Less nausea. No chest pain or palpitations. Feels somewhat "jittery."  Inpatient Medications    Scheduled Meds: . amiodarone  200 mg Oral BID  . amLODipine  2.5 mg Oral Daily  . dextrose  25 mL Intravenous Once  . DULoxetine  30 mg Oral Daily  . feeding supplement (GLUCERNA SHAKE)  237 mL Oral TID BM  . furosemide  40 mg Intravenous BID  . insulin aspart  0-9 Units Subcutaneous TID WC  . insulin aspart  2 Units Subcutaneous TID WC  . insulin glargine  6 Units Subcutaneous Daily  . levothyroxine  50 mcg Oral QAC breakfast  . linaclotide  290 mcg Oral QAC breakfast  . metoCLOPramide (REGLAN) injection  10 mg Intravenous TID AC  . metoprolol tartrate  50 mg Oral BID  . pantoprazole (PROTONIX) IV  40 mg Intravenous BID AC  . phytonadione  2.5 mg Oral Once  . rosuvastatin  5 mg Oral Daily  . sodium chloride flush  3 mL Intravenous Q12H   Continuous Infusions: . sodium chloride    . levofloxacin (LEVAQUIN) IV Stopped (05/13/17 1829)   PRN Meds: acetaminophen, ALPRAZolam, alum & mag hydroxide-simeth, HYDROcodone-acetaminophen, promethazine, sodium chloride, sodium chloride flush   Vital Signs    Vitals:   05/14/17 1400 05/14/17 2128 05/15/17 0644 05/15/17 0802  BP: (!) 137/44 (!) 149/55 (!) 162/67   Pulse: 70 73 68   Resp: 20 16 17    Temp: 97.7 F (36.5 C) 98.5 F (36.9 C) 98.5 F (36.9 C)   TempSrc: Oral Oral Oral   SpO2: 90% 90% 92%   Weight:    121 lb 12.8 oz (55.2 kg)  Height:        Intake/Output Summary (Last 24 hours) at 05/15/2017 1059 Last data filed at 05/15/2017 0800 Gross per 24 hour  Intake 0 ml  Output -  Net 0 ml   Filed Weights   05/09/17 0400 05/12/17 0500 05/15/17 0802  Weight: 122 lb 9.2 oz (55.6 kg) 123 lb 0.3 oz (55.8 kg) 121 lb 12.8 oz (55.2 kg)    Telemetry    Sinus rhythm with PACs. Personally  reviewed.  Physical Exam   GEN: Elderly woman. No acute distress.   Neck: No JVD. Cardiac: RRR, no gallop.  Respiratory: Decreased breath sounds without wheezing, few crackles at the bases. GI: Soft, nontender, bowel sounds present. MS: No edema; No deformity. Neuro:  Nonfocal. Psych: Alert and oriented x 3. Normal affect.  Labs    Chemistry Recent Labs  Lab 05/10/17 0601 05/11/17 0615 05/12/17 0418 05/13/17 0428 05/14/17 0830 05/15/17 1022  NA 138 138 137 141  --  144  K 4.6 3.9 3.7 3.5  --  2.8*  CL 101 100* 99* 102  --  100*  CO2 27 27 24 26   --  28  GLUCOSE 371* 166* 175* 133* 441* 176*  BUN 33* 34* 33* 34*  --  54*  CREATININE 1.48* 1.54* 1.58* 1.51*  --  1.90*  CALCIUM 8.2* 8.2* 8.0* 7.9*  --  8.2*  PROT 5.9* 6.0*  --  6.2*  --   --   ALBUMIN 2.9* 2.9*  --  2.8*  --   --   AST 28 27  --  24  --   --   ALT 27 24  --  21  --   --  ALKPHOS 72 65  --  64  --   --   BILITOT 0.5 0.5  --  0.9  --   --   GFRNONAA 33* 32* 31* 32*  --  25*  GFRAA 38* 37* 36* 38*  --  28*  ANIONGAP 10 11 14 13   --  16*     Hematology Recent Labs  Lab 05/10/17 0601 05/12/17 0418 05/13/17 0428  WBC 7.9 8.3 9.8  RBC 3.34* 3.63* 3.48*  HGB 10.7* 11.2* 10.7*  HCT 33.8* 36.2 34.8*  MCV 101.2* 99.7 100.0  MCH 32.0 30.9 30.7  MCHC 31.7 30.9 30.7  RDW 13.5 13.4 13.5  PLT 211 223 254    BNP Recent Labs  Lab 05/13/17 0428  BNP 373.0*     Radiology    Dg Chest 2 View  Result Date: 05/13/2017 CLINICAL DATA:  Shortness of Breath EXAM: CHEST  2 VIEW COMPARISON:  May 09, 2017 FINDINGS: There is stable cardiomegaly with mild pulmonary venous hypertension. There is aortic atherosclerosis. There is a loculated right pleural effusion with a somewhat equivocal left pleural effusion. There is patchy airspace opacity bilaterally, overall increased from recent study. There does not appear to be appreciable interstitial edema. No adenopathy evident.  No bone lesions. IMPRESSION: There is  evidence of underlying pulmonary vascular congestion. Opacification bilaterally has an appearance more suggestive of multifocal pneumonia than pulmonary edema, although pulmonary edema could present in this manner. Both pulmonary edema and pneumonia may present concurrently. There is aortic atherosclerosis. No adenopathy evident. Partially loculated right pleural effusion with equivocal left pleural effusion noted. Aortic Atherosclerosis (ICD10-I70.0). Electronically Signed   By: Bretta Bang III M.D.   On: 05/13/2017 11:39   Dg Ugi W/high Density W/kub  Result Date: 05/14/2017 CLINICAL DATA:  Dysphagia, feels like something is stuck in her throat when she tries to eat, nausea, vomiting; history diabetes mellitus, COPD, coronary artery disease, essential hypertension EXAM: UPPER GI SERIES WITH KUB TECHNIQUE: After obtaining a scout radiograph a routine upper GI series was performed using thin and high density barium. Patient also swallowed a 12.5 mm diameter barium tablet FLUOROSCOPY TIME:  Fluoroscopy Time:  3 minutes 0 seconds Radiation Exposure Index (if provided by the fluoroscopic device): 55.2 mGy Number of Acquired Spot Images: 3 plus multiple fluoroscopic screen captures COMPARISON:  None FINDINGS: Normal bowel gas pattern on scout image. Diffuse osseous demineralization with orthopedic hardware proximal LEFT femur. Scattered atherosclerotic calcifications. Calcified granulomata within spleen. RIGHT upper quadrant calcification likely a 10 mm diameter gallstone. Diffuse impairment of esophageal motility with incomplete clearance of barium by primary peristaltic waves. Prolonged thoracic retention of contrast in the thoracic esophagus. Numerous secondary and tertiary waves. 12.5 mm diameter barium tablet obstructed at the gastroesophageal junction and could not pass into the stomach. Smooth appearance of esophageal walls without irregularity or ulceration. Stomach distends normally. Atrophic rugal  folds. No discrete gastric mass or ulceration. Small to moderate-sized hiatal hernia. Slow emptying of contrast from the stomach. Duodenal bulb and sweep grossly normal appearance. Incidentally noted diverticula at the second and third portions of the duodenum. Visualized proximal jejunum unremarkable. IMPRESSION: Significant diffuse impairment of esophageal motility. Stricture at the gastroesophageal junction which obstructed the 12.5 mm diameter barium tablet. Small to moderate-sized hiatal hernia. Incidentally noted duodenal diverticula. Slow emptying of contrast from the stomach without outlet obstruction, question component of diabetic gastroparesis. Electronically Signed   By: Ulyses Southward M.D.   On: 05/14/2017 14:18    Cardiac Studies  Echocardiogram 05/06/2017: Study Conclusions  - Left ventricle: The cavity size was normal. Wall thickness was   normal. Systolic function was normal. The estimated ejection   fraction was in the range of 60% to 65%. Wall motion was normal;   there were no regional wall motion abnormalities. - Aortic valve: Mildly calcified annulus. Trileaflet; normal   thickness leaflets. Valve area (VTI): 1.87 cm^2. - Mitral valve: There was mild regurgitation. - Left atrium: The atrium was moderately dilated. - Right atrium: The atrium was mildly dilated. - Atrial septum: No defect or patent foramen ovale was identified. - Pulmonary arteries: Systolic pressure was mildly to moderately   increased. PA peak pressure: 40 mm Hg (S).  Patient Profile     77 y.o. female with past medical history of CAD (nonobstructive CAD by cath in 2012 with low-risk NST in 12/2016), HTN, HLD, Type 2 DM, and COPDwho is being followed by Cardiology for newly diagnosed atrial fibrillation. Now s/p TEE-guided DCCV on 05/11/2017.  Assessment & Plan    1. Newly documented/persistent atrial fibrillation with RVR now status post successful TEE guided cardioversion on January 7. She is  maintaining sinus rhythm and continues on amiodarone at 200 mg twice daily along with Lopressor 50 mg twice daily. CHADSVASC score is 5. She has been on Lovenox bridge with Coumadin recently, although Coumadin now held in anticipation of endoscopy. She was also given a low dose of vitamin K by GI.  2. Nonobstructive CAD by cardiac catheterization in 2012 and low risk follow-up stress testing in August of last year. No active chest pain or evidence of ACS.  3. Hyperlipidemia, on Crestor.  4. Essential hypertension, Norvasc resumed yesterday. Lisinopril remains on hold with renal insufficiency.  5. Acute renal insufficiency, creatinine up to 1.9 from 1.5.  6. COPD with possible pulmonary infiltrate plus minus component of acute on chronic diastolic heart failure. She is on antibiotics as well as IV Lasix.  Patient is scheduled for EGD with possible dilatation today per GI. Once procedure complete it might be worth considering initiation of DOAC instead of a more prolonged Lovenox bridge with resumption of Coumadin, it will likely take longer for INR to become therapeutic following vitamin K, and she needs to be therapeutic in light of recent TEE cardioversion. Would continue amiodarone at 200 mg twice daily through the weekend and then reduce to 200 mg once daily. Decrease Lasix to 40 mg IV once daily. Continue Lopressor and Crestor at current doses. May need further uptitration of Norvasc depending on blood pressure control.  Signed, Nona Dell, MD  05/15/2017, 10:59 AM

## 2017-05-15 NOTE — Progress Notes (Signed)
ANTICOAGULATION CONSULT NOTE - Initial Consult  Pharmacy Consult for Xarelto Indication: atrial fibrillation  Allergies  Allergen Reactions  . Codeine     REACTION: nausea  . Invokana [Canagliflozin]   . Naproxen Other (See Comments)    Abdominal pain  . Azithromycin Other (See Comments)    Hospital reaction    Patient Measurements: Height: 4\' 11"  (149.9 cm) Weight: 121 lb 12.8 oz (55.2 kg) IBW/kg (Calculated) : 43.2  Vital Signs: Temp: 98.9 F (37.2 C) (01/11 1415) Temp Source: Oral (01/11 1415) BP: 129/72 (01/11 1530) Pulse Rate: 64 (01/11 1535)  Labs: Recent Labs    05/13/17 0428 05/13/17 0934 05/14/17 0409 05/15/17 0525 05/15/17 1022  HGB 10.7*  --   --   --   --   HCT 34.8*  --   --   --   --   PLT 254  --   --   --   --   LABPROT 24.3* 22.9* 21.9* 22.2*  --   INR 2.21 2.05 1.93 1.96  --   CREATININE 1.51*  --   --   --  1.90*    Estimated Creatinine Clearance: 19.1 mL/min (A) (by C-G formula based on SCr of 1.9 mg/dL (H)).   Medical History: Past Medical History:  Diagnosis Date  . Arthritis   . Back pain 1/14   Tx by orthopedics  . COPD (chronic obstructive pulmonary disease) (HCC)   . Coronary atherosclerosis of native coronary artery    50% LAD stenosis  . Encounter for long-term (current) use of insulin (HCC)   . Family history of anesthesia complication    Daughter has nausea  . PDA (patent ductus arteriosus)    Small PDA documented by CT angiography, no pulmonary hypertension  . Pericardial calcification    Ruled out for restrictive pericarditis  . Thyroid disease   . Type II or unspecified type diabetes mellitus without mention of complication, not stated as uncontrolled   . Unspecified essential hypertension     Medications:  Medications Prior to Admission  Medication Sig Dispense Refill Last Dose  . acetaminophen (TYLENOL) 325 MG tablet Take 2 tablets (650 mg total) by mouth every 6 (six) hours as needed.   unknown  . ALPRAZolam  (XANAX) 0.5 MG tablet Take 1 tablet (0.5 mg total) by mouth 2 (two) times daily as needed. 160 tablet 0 05/05/2017 at Unknown time  . amLODipine (NORVASC) 5 MG tablet Take 1 tablet (5 mg total) daily by mouth. For blood pressure 90 tablet 1 05/04/2017 at Unknown time  . aspirin 81 MG tablet Take 81 mg by mouth daily.   unknown  . Calcium Carb-Cholecalciferol (CALCIUM 500 + D3) 500-600 MG-UNIT TABS Take 1 tablet by mouth daily. 60 tablet  05/04/2017 at Unknown time  . DULoxetine (CYMBALTA) 30 MG capsule Take 1 capsule (30 mg total) by mouth daily. 90 capsule 1 05/04/2017 at Unknown time  . furosemide (LASIX) 40 MG tablet 20 mg. Take one tablet by mouth as needed   05/05/2017 at Unknown time  . Insulin Glargine (LANTUS SOLOSTAR) 100 UNIT/ML Solostar Pen Inject 15 Units into the skin daily with breakfast. (Patient taking differently: Inject 14 Units into the skin daily with breakfast. ) 15 mL 2 05/05/2017 at Unknown time  . KLOR-CON M20 20 MEQ tablet TAKE 1 TABLET EVERY DAY 90 tablet 1 05/04/2017 at Unknown time  . levothyroxine (SYNTHROID, LEVOTHROID) 50 MCG tablet TAKE 1 TABLET EVERY DAY BEFORE BREAKFAST 90 tablet 1 05/05/2017 at Unknown  time  . lisinopril (PRINIVIL,ZESTRIL) 40 MG tablet Take 1 tablet (40 mg total) by mouth daily. 30 tablet 6 05/05/2017 at Unknown time  . metoprolol tartrate (LOPRESSOR) 25 MG tablet TAKE 1 TABLET TWICE DAILY 180 tablet 1 05/05/2017 at 0900  . NITROSTAT 0.4 MG SL tablet DISSOLVE  1 TABLET  UNDER THE TONGUE EVERY 5 MINUTES AS NEEDED FOR CHEST PAIN 25 tablet 0 unknown  . NOVOLOG FLEXPEN 100 UNIT/ML FlexPen INJECT 3 TO 5 UNITS SUBCUTANEOUSLY THREE TIMES DAILY WITH MEALS 15 mL 3 05/05/2017 at Unknown time  . PROLIA 60 MG/ML SOLN injection Inject 60 mg into the skin every 6 (six) months. To be administered 08/20/2016 1 each 0 unkmown  . rosuvastatin (CRESTOR) 5 MG tablet Take 1 tablet (5 mg total) daily by mouth. 90 tablet 1 05/05/2017 at Unknown time    Assessment: 77 yo female with  medical history significant of COPD, coronary artery disease, diabetes mellitus type 2, dyspnea on exertion who presented to the emergency department with palpitations and facial swelling and found to have A-fib with RVR. She underwent TEE cardioversion on 1/8. She was started on coumadin 05/05/2017. The coumadin was placed on hold for EGD. INR today was 1.96 and patient was given Vitamin K po 2.5mg . Patient is now status post EGD and pharmacy is asked to start Xarelto.  Goal of Therapy:  Monitor platelets by anticoagulation protocol: Yes   Plan:  xarelto 15mg  po daily with the evening meal Monitor for s/s of bleeding Educate on xarelto  Elder Cyphers, BS Pharm D, BCPS Clinical Pharmacist Pager [phone] 05/15/2017,4:01 PM

## 2017-05-15 NOTE — Progress Notes (Signed)
The patient was seen in anticipation of upper endoscopy with possible dilation today.  Upper GI series from yesterday noted pill hanging at the GE junction as well as diffuse gastric and esophageal dysmotility.  We feel the patient would benefit from EGD with dilation, pending INR today.  Today she denied abdominal pain, nausea, vomiting this morning.  Did have mild abdominal pain yesterday.  She is awake alert and oriented.  When discussing the procedure she indicated her desire to have this done today rather than waiting longer.  INR today stable at 1.96.  After discussing with Dr. Jena Gaussourk we will order 2 mg of p.o. vitamin K and plan for EGD with possible dilation under conscious sedation.  Her vital signs are stable with heart rate in the past 24 hours between 90 and 92.  Discussed with Dr. Jena Gaussourk and conscious sedation should be adequate for her procedure.  Proceed with EGD with dilation with Dr. Jena Gaussourk in near future: the risks, benefits, and alternatives have been discussed with the patient in detail. The patient states understanding and desires to proceed.  Lovenox was discontinued yesterday.  Confirmed Lovenox not on any active order list.  Lowest dose of po Vitamin K is 2.5 mg which was ordered.  Thank you for allowing us to participate in the care of Ellen Woods  Ellen DustEric Gill, DNP, AGNP-C Adult & Gerontological Nurse Practitioner [facility]   Agree with above. Seen in short stay. Plan for EGD with dilation as feasible/appropriate this afternoon per plan.  The risks, benefits, limitations, alternatives and imponderables have been reviewed with the patient. Potential for esophageal dilation, biopsy, etc. have also been reviewed.  Questions have been answered. All parties agreeable.

## 2017-05-16 ENCOUNTER — Inpatient Hospital Stay (HOSPITAL_COMMUNITY): Payer: Medicare HMO

## 2017-05-16 DIAGNOSIS — K449 Diaphragmatic hernia without obstruction or gangrene: Secondary | ICD-10-CM

## 2017-05-16 DIAGNOSIS — K224 Dyskinesia of esophagus: Secondary | ICD-10-CM

## 2017-05-16 DIAGNOSIS — J189 Pneumonia, unspecified organism: Secondary | ICD-10-CM

## 2017-05-16 LAB — GLUCOSE, CAPILLARY
GLUCOSE-CAPILLARY: 62 mg/dL — AB (ref 65–99)
GLUCOSE-CAPILLARY: 80 mg/dL (ref 65–99)
Glucose-Capillary: 77 mg/dL (ref 65–99)
Glucose-Capillary: 132 mg/dL — ABNORMAL HIGH (ref 65–99)
Glucose-Capillary: 167 mg/dL — ABNORMAL HIGH (ref 65–99)

## 2017-05-16 LAB — BASIC METABOLIC PANEL
ANION GAP: 17 — AB (ref 5–15)
BUN: 49 mg/dL — ABNORMAL HIGH (ref 6–20)
CO2: 28 mmol/L (ref 22–32)
Calcium: 7.7 mg/dL — ABNORMAL LOW (ref 8.9–10.3)
Chloride: 99 mmol/L — ABNORMAL LOW (ref 101–111)
Creatinine, Ser: 1.81 mg/dL — ABNORMAL HIGH (ref 0.44–1.00)
GFR calc Af Amer: 30 mL/min — ABNORMAL LOW (ref >60)
GFR, EST NON AFRICAN AMERICAN: 26 mL/min — AB (ref >60)
GLUCOSE: 94 mg/dL (ref 65–99)
POTASSIUM: 3 mmol/L — AB (ref 3.5–5.1)
Sodium: 144 mmol/L (ref 135–145)

## 2017-05-16 LAB — PROTIME-INR
INR: 2.68
PROTHROMBIN TIME: 28.3 s — AB (ref 11.4–15.2)

## 2017-05-16 MED ORDER — PIPERACILLIN-TAZOBACTAM 3.375 G IVPB
3.3750 g | Freq: Three times a day (TID) | INTRAVENOUS | Status: DC
  Administered 2017-05-16 – 2017-05-21 (×16): 3.375 g via INTRAVENOUS
  Filled 2017-05-16 (×16): qty 50

## 2017-05-16 NOTE — Progress Notes (Signed)
Patient without complaints. She does state she was able to swallow breakfast better this morning  Vital signs in last 24 hours: Temp:  [98 F (36.7 C)-98.9 F (37.2 C)] 98.6 F (37 C) (01/12 0340) Pulse Rate:  [60-80] 64 (01/12 0340) Resp:  [14-23] 20 (01/12 0340) BP: (106-145)/(47-72) 131/53 (01/12 0340) SpO2:  [87 %-99 %] 99 % (01/12 0340) Weight:  [122 lb 6.4 oz (55.5 kg)] 122 lb 6.4 oz (55.5 kg) (01/12 0340) Last BM Date: 05/10/17 General:   pleasant and cooperative in NAD Abdomen:  Nondistended. Soft and nontender without appreciable mass or organomegaly  Extremities:  Without clubbing or edema.    Intake/Output from previous day: 01/11 0701 - 01/12 0700 In: 260.3 [P.O.:120; I.V.:40.3; IV Piggyback:100] Out: 1150 [Urine:1150] Intake/Output this shift: Total I/O In: 120 [P.O.:120] Out: 500 [Urine:500]  Lab Results: No results for input(s): WBC, HGB, HCT, PLT in the last 72 hours. BMET Recent Labs    05/14/17 0830 05/15/17 1022 05/16/17 0555  NA  --  144 144  K  --  2.8* 3.0*  CL  --  100* 99*  CO2  --  28 28  GLUCOSE 441* 176* 94  BUN  --  54* 49*  CREATININE  --  1.90* 1.81*  CALCIUM  --  8.2* 7.7*   LFT No results for input(s): PROT, ALBUMIN, AST, ALT, ALKPHOS, BILITOT, BILIDIR, IBILI in the last 72 hours. PT/INR Recent Labs    05/15/17 0525 05/16/17 0555  LABPROT 22.2* 28.3*  INR 1.96 2.68   Hepatitis Panel No results for input(s): HEPBSAG, HCVAB, HEPAIGM, HEPBIGM in the last 72 hours. C-Diff No results for input(s): CDIFFTOX in the last 72 hours.  Studies/Results: Dg Chest Port 1 View  Result Date: 05/16/2017 CLINICAL DATA:  Shortness of breath.  Hypoxia. EXAM: PORTABLE CHEST 1 VIEW COMPARISON:  Two-view chest x-ray 05/13/2016. FINDINGS: The heart is enlarged. Diffuse interstitial and airspace disease is again seen. Consolidation is worse on the right than left base. Bilateral effusions are suspected. IMPRESSION: 1. Bilateral interstitial and  airspace disease compatible with multi lobar pneumonia. 2. Bilateral pleural effusions. 3. Underlying edema is likely present as well. Electronically Signed   By: Marin Roberts M.D.   On: 05/16/2017 08:34   Dg Kayleen Memos W/high Density W/kub  Result Date: 05/14/2017 CLINICAL DATA:  Dysphagia, feels like something is stuck in her throat when she tries to eat, nausea, vomiting; history diabetes mellitus, COPD, coronary artery disease, essential hypertension EXAM: UPPER GI SERIES WITH KUB TECHNIQUE: After obtaining a scout radiograph a routine upper GI series was performed using thin and high density barium. Patient also swallowed a 12.5 mm diameter barium tablet FLUOROSCOPY TIME:  Fluoroscopy Time:  3 minutes 0 seconds Radiation Exposure Index (if provided by the fluoroscopic device): 55.2 mGy Number of Acquired Spot Images: 3 plus multiple fluoroscopic screen captures COMPARISON:  None FINDINGS: Normal bowel gas pattern on scout image. Diffuse osseous demineralization with orthopedic hardware proximal LEFT femur. Scattered atherosclerotic calcifications. Calcified granulomata within spleen. RIGHT upper quadrant calcification likely a 10 mm diameter gallstone. Diffuse impairment of esophageal motility with incomplete clearance of barium by primary peristaltic waves. Prolonged thoracic retention of contrast in the thoracic esophagus. Numerous secondary and tertiary waves. 12.5 mm diameter barium tablet obstructed at the gastroesophageal junction and could not pass into the stomach. Smooth appearance of esophageal walls without irregularity or ulceration. Stomach distends normally. Atrophic rugal folds. No discrete gastric mass or ulceration. Small to moderate-sized hiatal hernia. Slow emptying  of contrast from the stomach. Duodenal bulb and sweep grossly normal appearance. Incidentally noted diverticula at the second and third portions of the duodenum. Visualized proximal jejunum unremarkable. IMPRESSION:  Significant diffuse impairment of esophageal motility. Stricture at the gastroesophageal junction which obstructed the 12.5 mm diameter barium tablet. Small to moderate-sized hiatal hernia. Incidentally noted duodenal diverticula. Slow emptying of contrast from the stomach without outlet obstruction, question component of diabetic gastroparesis. Electronically Signed   By: Ulyses SouthwardMark  Boles M.D.   On: 05/14/2017 14:18   Impression:  Pleasant 77 year old lady with upper GI tract dysmotility. Status post esophageal dilation yesterday. Dysphagia appears to be improved. I am concerned she may have the underlying achalasia. If so, improvement from esophageal dilation will be relatively short-lived.  Recommendations:  Continue dysphagia 2 diet.  Continue PPI. Will arrange follow-up as an outpatient.

## 2017-05-16 NOTE — Progress Notes (Signed)
Pharmacy Antibiotic Note  Ellen HaverGlenda J Woods is a 77 y.o. female admitted on 05/05/2017 with aspiration pneumonia.  Pharmacy has been consulted for zosyn dosing.  Plan: Zosyn 3.375g IV q8h, EID over 4 hours Monitor labs, progress, c/s  Height: 4\' 11"  (149.9 cm) Weight: 122 lb 6.4 oz (55.5 kg) IBW/kg (Calculated) : 43.2  Temp (24hrs), Avg:98.5 F (36.9 C), Min:98 F (36.7 C), Max:98.9 F (37.2 C)  Recent Labs  Lab 05/10/17 0601 05/11/17 0615 05/12/17 0418 05/13/17 0428 05/15/17 1022 05/16/17 0555  WBC 7.9  --  8.3 9.8  --   --   CREATININE 1.48* 1.54* 1.58* 1.51* 1.90* 1.81*    Estimated Creatinine Clearance: 20.1 mL/min (A) (by C-G formula based on SCr of 1.81 mg/dL (H)).    Allergies  Allergen Reactions  . Codeine     REACTION: nausea  . Invokana [Canagliflozin]   . Naproxen Other (See Comments)    Abdominal pain  . Azithromycin Other (See Comments)    Hospital reaction   Antimicrobials this admission: Levaquin 1/9 >> 1/12 Zosyn 1/12>>  Microbiology results: 1/1:  MRSA PCR: NEGATIVE  Thank you for allowing pharmacy to be a part of this patient's care.  Elder CyphersLorie Tzipora Mcinroy, BS Pharm D, [place] Clinical Pharmacist Pager [phone] 05/16/2017 9:48 AM

## 2017-05-16 NOTE — Progress Notes (Signed)
Patient has blood sugar of 62 which increased to 80 with orange juice, MD aware.

## 2017-05-16 NOTE — Progress Notes (Addendum)
PROGRESS NOTE    Ellen Woods   WUJ:[id number]  DOB: 1940-06-19  DOA: 05/05/2017 PCP: Dettinger, Elige Radon, MD   Brief Narrative:  Ellen Woods is a 77 y.o. female with medical history significant of COPD, coronary artery disease, diabetes mellitus type 2, dyspnea on exertion who presented to the emergency department with palpitations and facial swelling. In the ER she was found to have A-fib with RVR and pulmonary edema.  Subjective: No complaints of cough, dyspnea, nausea or vomiting. Asking for her anxiety pill.   Assessment & Plan:   Principal Problem:   Atrial fibrillation with RVR - underwent TEE cardioversion on 1/8 - CHA2DS2-VASc Score 5 - being managed by cardiology with Amiodarone and Lopressor - has been on Coumadin but was put on hold for EGD -  resume NOAC yesterday per cardiology recommendations  Active Problems: Nausea/ vomiting- esophageal stricture - occasional dysphagia and regurgitation of food per daughter  - upper GI series shows severe impairment of esophageal motility, a narrowing at the GE junction, a hiatal hernia and possible GOO vs gastroparesis - she needed esophageal dilation about 15 yrs ago  - 1/11- EGD did not reveal any stricture- GI junction was "empirically dilated"  In case she has achalasia - her regurgitation symptoms are likely due to severe esophageal dysmotility and the hiatal hernia    Dyspnea/ acute resp distress- due to acute d CHF and or b/l lobar pneumonia - 1/9-  RR is in 20s on my exam and there are crackles at bases -  - per patient, she has had a cough for about 1 wk - CXR >>- is not clear on whether this is pneumonia or CHF  - started on lasix and Levaquin  - 1/10- mild improvement noted- she states cough is gone but she is still hypoxic- cont to diurese and treat with Levaquin and attempt to wean O2 1/11- symptoms appear to be improving- she is not dyspneic today- wean O2- cont diuretics and  antibiotics 1/12- repeat CXR shows bilateral multifocal pneumonia which is quite extensive in the right lung base- ? If she aspirated in relation to above esophageal dysmotility- will change Levaquin to Zosyn today- she still has extensive crackles on exam and is still requiring O2  Severe constipation  - on Linzess - not much stool output after suppositories- she has not had much to eat in the hospital and may not have much stool to pass- follow    Chronic diastolic heart failure  - see above- now euvolemic, hold Lasix for today    Diabetes type 2, uncontrolled - cont insulin and follow    Hypothyroidism - Synthroid     DVT prophylaxis: SCDs Code Status: Full code Family Communication: daughter Disposition Plan:  Consultants:   Cardiology  GI Procedures:  2 D ECHO Left ventricle: The cavity size was normal. Wall thickness was   normal. Systolic function was normal. The estimated ejection   fraction was in the range of 60% to 65%. Wall motion was normal;   there were no regional wall motion abnormalities. - Aortic valve: Mildly calcified annulus. Trileaflet; normal   thickness leaflets. Valve area (VTI): 1.87 cm^2. - Mitral valve: There was mild regurgitation. - Left atrium: The atrium was moderately dilated. - Right atrium: The atrium was mildly dilated. - Atrial septum: No defect or patent foramen ovale was identified. - Pulmonary arteries: Systolic pressure was mildly to moderately   increased. PA peak pressure: 40 mm Hg (S).  TEE cardioversion  Antimicrobials:  Anti-infectives (From admission, onward)   Start     Dose/Rate Route Frequency Ordered Stop   05/16/17 1030  piperacillin-tazobactam (ZOSYN) IVPB 3.375 g     3.375 g 12.5 mL/hr over 240 Minutes Intravenous Every 8 hours 05/16/17 1008     05/15/17 1800  levofloxacin (LEVAQUIN) IVPB 500 mg  Status:  Discontinued     500 mg 100 mL/hr over 60 Minutes Intravenous Every 48 hours 05/15/17 1204 05/16/17 0941    05/13/17 1800  levofloxacin (LEVAQUIN) IVPB 750 mg  Status:  Discontinued     750 mg 100 mL/hr over 90 Minutes Intravenous Every 48 hours 05/13/17 1610 05/15/17 1204       Objective: Vitals:   05/15/17 1547 05/15/17 1700 05/15/17 2000 05/16/17 0340  BP:  135/68 (!) 106/47 (!) 131/53  Pulse:  71 60 64  Resp: (!) 22 20 18 20   Temp:  98 F (36.7 C) 98.7 F (37.1 C) 98.6 F (37 C)  TempSrc:  Oral Oral Oral  SpO2: 90% 91% 91% 99%  Weight:    55.5 kg (122 lb 6.4 oz)  Height:        Intake/Output Summary (Last 24 hours) at 05/16/2017 1053 Last data filed at 05/16/2017 0900 Gross per 24 hour  Intake 380.33 ml  Output 1100 ml  Net -719.67 ml   Filed Weights   05/12/17 0500 05/15/17 0802 05/16/17 0340  Weight: 55.8 kg (123 lb 0.3 oz) 55.2 kg (121 lb 12.8 oz) 55.5 kg (122 lb 6.4 oz)    Examination: General exam: Appears comfortable  HEENT: PERRLA, oral mucosa moist, no sclera icterus or thrush Respiratory system: persistent crackles in b/l lung fields and mostly in the right base today- normal resp effort Cardiovascular system: S1 & S2 heard, RRR.  No murmurs  Gastrointestinal system: Abdomen soft, non-tender, nondistended. Normal bowel sound. No organomegaly Central nervous system: Alert and oriented. No focal neurological deficits. Extremities: No cyanosis, clubbing or edema Skin: No rashes or ulcers Psychiatry:  Mood & affect appropriate.     Data Reviewed: I have personally reviewed following labs and imaging studies  CBC: Recent Labs  Lab 05/10/17 0601 05/12/17 0418 05/13/17 0428  WBC 7.9 8.3 9.8  NEUTROABS 7.0  --   --   HGB 10.7* 11.2* 10.7*  HCT 33.8* 36.2 34.8*  MCV 101.2* 99.7 100.0  PLT 211 223 254   Basic Metabolic Panel: Recent Labs  Lab 05/10/17 0601 05/11/17 0615 05/12/17 0418 05/13/17 0428 05/14/17 0830 05/15/17 1022 05/16/17 0555  NA 138 138 137 141  --  144 144  K 4.6 3.9 3.7 3.5  --  2.8* 3.0*  CL 101 100* 99* 102  --  100* 99*  CO2 27  27 24 26   --  28 28  GLUCOSE 371* 166* 175* 133* 441* 176* 94  BUN 33* 34* 33* 34*  --  54* 49*  CREATININE 1.48* 1.54* 1.58* 1.51*  --  1.90* 1.81*  CALCIUM 8.2* 8.2* 8.0* 7.9*  --  8.2* 7.7*  MG 2.1  --   --   --   --   --   --    GFR: Estimated Creatinine Clearance: 20.1 mL/min (A) (by C-G formula based on SCr of 1.81 mg/dL (H)). Liver Function Tests: Recent Labs  Lab 05/10/17 0601 05/11/17 0615 05/13/17 0428  AST 28 27 24   ALT 27 24 21   ALKPHOS 72 65 64  BILITOT 0.5 0.5 0.9  PROT 5.9* 6.0* 6.2*  ALBUMIN 2.9* 2.9* 2.8*   No results for input(s): LIPASE, AMYLASE in the last 168 hours. No results for input(s): AMMONIA in the last 168 hours. Coagulation Profile: Recent Labs  Lab 05/13/17 0428 05/13/17 0934 05/14/17 0409 05/15/17 0525 05/16/17 0555  INR 2.21 2.05 1.93 1.96 2.68   Cardiac Enzymes: No results for input(s): CKTOTAL, CKMB, CKMBINDEX, TROPONINI in the last 168 hours. BNP (last 3 results) No results for input(s): PROBNP in the last 8760 hours. HbA1C: No results for input(s): HGBA1C in the last 72 hours. CBG: Recent Labs  Lab 05/15/17 1417 05/15/17 1637 05/15/17 2027 05/16/17 0343 05/16/17 0753  GLUCAP 100* 88 106* 77 132*   Lipid Profile: No results for input(s): CHOL, HDL, LDLCALC, TRIG, CHOLHDL, LDLDIRECT in the last 72 hours. Thyroid Function Tests: No results for input(s): TSH, T4TOTAL, FREET4, T3FREE, THYROIDAB in the last 72 hours. Anemia Panel: No results for input(s): VITAMINB12, FOLATE, FERRITIN, TIBC, IRON, RETICCTPCT in the last 72 hours. Urine analysis:    Component Value Date/Time   COLORURINE YELLOW 03/14/2014 1227   APPEARANCEUR CLEAR 03/14/2014 1227   LABSPEC 1.015 03/14/2014 1227   PHURINE 5.0 03/14/2014 1227   GLUCOSEU 100 (A) 03/14/2014 1227   HGBUR NEGATIVE 03/14/2014 1227   BILIRUBINUR neg 06/13/2015 1710   KETONESUR NEGATIVE 03/14/2014 1227   PROTEINUR neg 06/13/2015 1710   PROTEINUR NEGATIVE 03/14/2014 1227    UROBILINOGEN negative 06/13/2015 1710   UROBILINOGEN 0.2 03/14/2014 1227   NITRITE neg 06/13/2015 1710   NITRITE NEGATIVE 03/14/2014 1227   LEUKOCYTESUR Negative 06/13/2015 1710   Sepsis Labs: @LABRCNTIP (procalcitonin:4,lacticidven:4) ) No results found for this or any previous visit (from the past 240 hour(s)).       Radiology Studies: Dg Chest Port 1 View  Result Date: 05/16/2017 CLINICAL DATA:  Shortness of breath.  Hypoxia. EXAM: PORTABLE CHEST 1 VIEW COMPARISON:  Two-view chest x-ray 05/13/2016. FINDINGS: The heart is enlarged. Diffuse interstitial and airspace disease is again seen. Consolidation is worse on the right than left base. Bilateral effusions are suspected. IMPRESSION: 1. Bilateral interstitial and airspace disease compatible with multi lobar pneumonia. 2. Bilateral pleural effusions. 3. Underlying edema is likely present as well. Electronically Signed   By: Marin Roberts M.D.   On: 05/16/2017 08:34   Dg Kayleen Memos W/high Density W/kub  Result Date: 05/14/2017 CLINICAL DATA:  Dysphagia, feels like something is stuck in her throat when she tries to eat, nausea, vomiting; history diabetes mellitus, COPD, coronary artery disease, essential hypertension EXAM: UPPER GI SERIES WITH KUB TECHNIQUE: After obtaining a scout radiograph a routine upper GI series was performed using thin and high density barium. Patient also swallowed a 12.5 mm diameter barium tablet FLUOROSCOPY TIME:  Fluoroscopy Time:  3 minutes 0 seconds Radiation Exposure Index (if provided by the fluoroscopic device): 55.2 mGy Number of Acquired Spot Images: 3 plus multiple fluoroscopic screen captures COMPARISON:  None FINDINGS: Normal bowel gas pattern on scout image. Diffuse osseous demineralization with orthopedic hardware proximal LEFT femur. Scattered atherosclerotic calcifications. Calcified granulomata within spleen. RIGHT upper quadrant calcification likely a 10 mm diameter gallstone. Diffuse impairment of  esophageal motility with incomplete clearance of barium by primary peristaltic waves. Prolonged thoracic retention of contrast in the thoracic esophagus. Numerous secondary and tertiary waves. 12.5 mm diameter barium tablet obstructed at the gastroesophageal junction and could not pass into the stomach. Smooth appearance of esophageal walls without irregularity or ulceration. Stomach distends normally. Atrophic rugal folds. No discrete gastric mass or ulceration. Small to moderate-sized hiatal  hernia. Slow emptying of contrast from the stomach. Duodenal bulb and sweep grossly normal appearance. Incidentally noted diverticula at the second and third portions of the duodenum. Visualized proximal jejunum unremarkable. IMPRESSION: Significant diffuse impairment of esophageal motility. Stricture at the gastroesophageal junction which obstructed the 12.5 mm diameter barium tablet. Small to moderate-sized hiatal hernia. Incidentally noted duodenal diverticula. Slow emptying of contrast from the stomach without outlet obstruction, question component of diabetic gastroparesis. Electronically Signed   By: Ulyses SouthwardMark  Boles M.D.   On: 05/14/2017 14:18      Scheduled Meds: . amiodarone  200 mg Oral BID  . amLODipine  2.5 mg Oral Daily  . dextrose  25 mL Intravenous Once  . DULoxetine  30 mg Oral Daily  . feeding supplement (GLUCERNA SHAKE)  237 mL Oral TID BM  . insulin aspart  0-9 Units Subcutaneous TID WC  . insulin aspart  2 Units Subcutaneous TID WC  . insulin glargine  6 Units Subcutaneous Daily  . levothyroxine  50 mcg Oral QAC breakfast  . linaclotide  290 mcg Oral QAC breakfast  . metoCLOPramide (REGLAN) injection  5 mg Intravenous TID AC  . metoprolol tartrate  50 mg Oral BID  . pantoprazole (PROTONIX) IV  40 mg Intravenous BID AC  . rivaroxaban  15 mg Oral Q supper  . rosuvastatin  5 mg Oral Daily  . sodium chloride flush  3 mL Intravenous Q12H   Continuous Infusions: . sodium chloride    .  piperacillin-tazobactam (ZOSYN)  IV       LOS: 11 days    Time spent in minutes: 45    Calvert CantorSaima Makynlie Rossini, MD [facility] Pager: [email or web address] Password [email or web address] 05/16/2017, 10:53 AM

## 2017-05-17 LAB — CBC
HEMATOCRIT: 34.8 % — AB (ref 36.0–46.0)
Hemoglobin: 10.7 g/dL — ABNORMAL LOW (ref 12.0–15.0)
MCH: 30.9 pg (ref 26.0–34.0)
MCHC: 30.7 g/dL (ref 30.0–36.0)
MCV: 100.6 fL — AB (ref 78.0–100.0)
Platelets: 310 10*3/uL (ref 150–400)
RBC: 3.46 MIL/uL — ABNORMAL LOW (ref 3.87–5.11)
RDW: 13.6 % (ref 11.5–15.5)
WBC: 10.7 10*3/uL — ABNORMAL HIGH (ref 4.0–10.5)

## 2017-05-17 LAB — BASIC METABOLIC PANEL
Anion gap: 16 — ABNORMAL HIGH (ref 5–15)
BUN: 47 mg/dL — AB (ref 6–20)
CHLORIDE: 96 mmol/L — AB (ref 101–111)
CO2: 28 mmol/L (ref 22–32)
Calcium: 7.4 mg/dL — ABNORMAL LOW (ref 8.9–10.3)
Creatinine, Ser: 1.83 mg/dL — ABNORMAL HIGH (ref 0.44–1.00)
GFR calc Af Amer: 30 mL/min — ABNORMAL LOW (ref >60)
GFR calc non Af Amer: 26 mL/min — ABNORMAL LOW (ref >60)
GLUCOSE: 217 mg/dL — AB (ref 65–99)
Potassium: 3.1 mmol/L — ABNORMAL LOW (ref 3.5–5.1)
Sodium: 140 mmol/L (ref 135–145)

## 2017-05-17 LAB — GLUCOSE, CAPILLARY
GLUCOSE-CAPILLARY: 200 mg/dL — AB (ref 65–99)
GLUCOSE-CAPILLARY: 201 mg/dL — AB (ref 65–99)
GLUCOSE-CAPILLARY: 191 mg/dL — AB (ref 65–99)
GLUCOSE-CAPILLARY: 91 mg/dL (ref 65–99)
Glucose-Capillary: 210 mg/dL — ABNORMAL HIGH (ref 65–99)

## 2017-05-17 LAB — PROTIME-INR
INR: 3.27
PROTHROMBIN TIME: 33.1 s — AB (ref 11.4–15.2)

## 2017-05-17 LAB — MAGNESIUM: MAGNESIUM: 2.2 mg/dL (ref 1.7–2.4)

## 2017-05-17 MED ORDER — POTASSIUM CHLORIDE CRYS ER 20 MEQ PO TBCR
40.0000 meq | EXTENDED_RELEASE_TABLET | ORAL | Status: AC
  Administered 2017-05-17 (×2): 40 meq via ORAL
  Filled 2017-05-17 (×2): qty 2

## 2017-05-17 MED ORDER — PANTOPRAZOLE SODIUM 40 MG PO TBEC
40.0000 mg | DELAYED_RELEASE_TABLET | Freq: Two times a day (BID) | ORAL | Status: DC
  Administered 2017-05-17 – 2017-05-25 (×16): 40 mg via ORAL
  Filled 2017-05-17 (×16): qty 1

## 2017-05-17 NOTE — Progress Notes (Signed)
PROGRESS NOTE    Ellen Woods   ZOX:[id number]  DOB: 1940/08/26  DOA: 05/05/2017 PCP: Dettinger, Elige Radon, MD   Brief Narrative:  Ellen Woods is a 77 y.o. female with medical history significant of COPD, coronary artery disease, diabetes mellitus type 2, dyspnea on exertion who presented to the emergency department with palpitations and facial swelling. In the ER she was found to have A-fib with RVR and pulmonary edema.  Subjective: No complaints today.    Assessment & Plan:   Principal Problem:   Atrial fibrillation with RVR - underwent TEE cardioversion on 1/8 - CHA2DS2-VASc Score 5 - being managed by cardiology with Amiodarone and Lopressor - has been on Coumadin but was put on hold for EGD -  resume NOAC per cardiology recommendations  Active Problems: Nausea/ vomiting- esophageal stricture - occasional dysphagia and regurgitation of food per daughter  - upper GI series shows severe impairment of esophageal motility, a narrowing at the GE junction, a hiatal hernia and possible GOO vs gastroparesis - she needed esophageal dilation about 15 yrs ago  - 1/11- EGD did not reveal any stricture- GI junction was "empirically dilated"  In case she has achalasia - her regurgitation symptoms are likely due to severe esophageal dysmotility and a hiatal hernia    Dyspnea/ acute resp distress- due to acute d CHF and or b/l lobar pneumonia - 1/9-  RR is in 20s on my exam and there are crackles at bases -  - per patient, she has had a cough for about 1 wk - CXR >>- is not clear on whether this is pneumonia or CHF  - started on lasix and Levaquin  - 1/10- mild improvement noted- she states cough is gone but she is still hypoxic- cont to diurese and treat with Levaquin and attempt to wean O2 1/11- symptoms appear to be improving- she is not dyspneic today- wean O2- cont diuretics and antibiotics 1/12- repeat CXR shows bilateral multifocal pneumonia which is quite  extensive in the right lung base- ? If she aspirated in relation to above esophageal dysmotility- will change Levaquin to Zosyn today- she still has extensive crackles on exam and is still requiring O2 - 1/13- cont current management with anibiotics- wean O2 as able  Severe constipation  - on Linzess - not much stool output after suppositories- she has not had much to eat in the hospital and may not have much stool to pass- follow    Chronic diastolic heart failure  - see above- now euvolemic, hold Lasix for today  CKD 3-4   - follow  Hypokalemia - replace    Diabetes type 2, uncontrolled - cont insulin - sugar was a little low yesterday evening- her oral intake must be erratic- we are being careful with insluin    Hypothyroidism - Synthroid     DVT prophylaxis: SCDs Code Status: Full code Family Communication: daughter Disposition Plan:  Consultants:   Cardiology  GI Procedures:  2 D ECHO Left ventricle: The cavity size was normal. Wall thickness was   normal. Systolic function was normal. The estimated ejection   fraction was in the range of 60% to 65%. Wall motion was normal;   there were no regional wall motion abnormalities. - Aortic valve: Mildly calcified annulus. Trileaflet; normal   thickness leaflets. Valve area (VTI): 1.87 cm^2. - Mitral valve: There was mild regurgitation. - Left atrium: The atrium was moderately dilated. - Right atrium: The atrium was mildly dilated. -  Atrial septum: No defect or patent foramen ovale was identified. - Pulmonary arteries: Systolic pressure was mildly to moderately   increased. PA peak pressure: 40 mm Hg (S).  TEE cardioversion  Antimicrobials:  Anti-infectives (From admission, onward)   Start     Dose/Rate Route Frequency Ordered Stop   05/16/17 1030  piperacillin-tazobactam (ZOSYN) IVPB 3.375 g     3.375 g 12.5 mL/hr over 240 Minutes Intravenous Every 8 hours 05/16/17 1008     05/15/17 1800  levofloxacin (LEVAQUIN)  IVPB 500 mg  Status:  Discontinued     500 mg 100 mL/hr over 60 Minutes Intravenous Every 48 hours 05/15/17 1204 05/16/17 0941   05/13/17 1800  levofloxacin (LEVAQUIN) IVPB 750 mg  Status:  Discontinued     750 mg 100 mL/hr over 90 Minutes Intravenous Every 48 hours 05/13/17 1610 05/15/17 1204       Objective: Vitals:   05/16/17 1510 05/16/17 2052 05/16/17 2110 05/17/17 0507  BP:   (!) 128/55 (!) 153/55  Pulse:  71 69 72  Resp:  20 20 20   Temp:   99 F (37.2 C) 99.3 F (37.4 C)  TempSrc:   Oral Oral  SpO2: 90% 93% 92% 97%  Weight:    51.9 kg (114 lb 6.7 oz)  Height:        Intake/Output Summary (Last 24 hours) at 05/17/2017 1152 Last data filed at 05/17/2017 1610 Gross per 24 hour  Intake 390 ml  Output 1050 ml  Net -660 ml   Filed Weights   05/15/17 0802 05/16/17 0340 05/17/17 0507  Weight: 55.2 kg (121 lb 12.8 oz) 55.5 kg (122 lb 6.4 oz) 51.9 kg (114 lb 6.7 oz)    Examination: General exam: Appears comfortable  HEENT: PERRLA, oral mucosa moist, no sclera icterus or thrush Respiratory system: persistent crackles in b/l lung fields and mostly in the right base today- not as severe as yesterday normal resp effort Cardiovascular system: S1 & S2 heard, RRR.  No murmurs  Gastrointestinal system: Abdomen soft, non-tender, nondistended. Normal bowel sound. No organomegaly Central nervous system: Alert and oriented. No focal neurological deficits. Extremities: No cyanosis, clubbing or edema Skin: No rashes or ulcers Psychiatry:  Mood & affect appropriate.     Data Reviewed: I have personally reviewed following labs and imaging studies  CBC: Recent Labs  Lab 05/12/17 0418 05/13/17 0428 05/17/17 0551  WBC 8.3 9.8 10.7*  HGB 11.2* 10.7* 10.7*  HCT 36.2 34.8* 34.8*  MCV 99.7 100.0 100.6*  PLT 223 254 310   Basic Metabolic Panel: Recent Labs  Lab 05/12/17 0418 05/13/17 0428 05/14/17 0830 05/15/17 1022 05/16/17 0555 05/17/17 0551  NA 137 141  --  144 144 140    K 3.7 3.5  --  2.8* 3.0* 3.1*  CL 99* 102  --  100* 99* 96*  CO2 24 26  --  28 28 28   GLUCOSE 175* 133* 441* 176* 94 217*  BUN 33* 34*  --  54* 49* 47*  CREATININE 1.58* 1.51*  --  1.90* 1.81* 1.83*  CALCIUM 8.0* 7.9*  --  8.2* 7.7* 7.4*   GFR: Estimated Creatinine Clearance: 19.3 mL/min (A) (by C-G formula based on SCr of 1.83 mg/dL (H)). Liver Function Tests: Recent Labs  Lab 05/11/17 0615 05/13/17 0428  AST 27 24  ALT 24 21  ALKPHOS 65 64  BILITOT 0.5 0.9  PROT 6.0* 6.2*  ALBUMIN 2.9* 2.8*   No results for input(s): LIPASE, AMYLASE in the last  168 hours. No results for input(s): AMMONIA in the last 168 hours. Coagulation Profile: Recent Labs  Lab 05/13/17 0934 05/14/17 0409 05/15/17 0525 05/16/17 0555 05/17/17 0511  INR 2.05 1.93 1.96 2.68 3.27   Cardiac Enzymes: No results for input(s): CKTOTAL, CKMB, CKMBINDEX, TROPONINI in the last 168 hours. BNP (last 3 results) No results for input(s): PROBNP in the last 8760 hours. HbA1C: No results for input(s): HGBA1C in the last 72 hours. CBG: Recent Labs  Lab 05/16/17 1054 05/16/17 1138 05/16/17 1638 05/17/17 0502 05/17/17 0738  GLUCAP 62* 80 167* 200* 210*   Lipid Profile: No results for input(s): CHOL, HDL, LDLCALC, TRIG, CHOLHDL, LDLDIRECT in the last 72 hours. Thyroid Function Tests: No results for input(s): TSH, T4TOTAL, FREET4, T3FREE, THYROIDAB in the last 72 hours. Anemia Panel: No results for input(s): VITAMINB12, FOLATE, FERRITIN, TIBC, IRON, RETICCTPCT in the last 72 hours. Urine analysis:    Component Value Date/Time   COLORURINE YELLOW 03/14/2014 1227   APPEARANCEUR CLEAR 03/14/2014 1227   LABSPEC 1.015 03/14/2014 1227   PHURINE 5.0 03/14/2014 1227   GLUCOSEU 100 (A) 03/14/2014 1227   HGBUR NEGATIVE 03/14/2014 1227   BILIRUBINUR neg 06/13/2015 1710   KETONESUR NEGATIVE 03/14/2014 1227   PROTEINUR neg 06/13/2015 1710   PROTEINUR NEGATIVE 03/14/2014 1227   UROBILINOGEN negative 06/13/2015  1710   UROBILINOGEN 0.2 03/14/2014 1227   NITRITE neg 06/13/2015 1710   NITRITE NEGATIVE 03/14/2014 1227   LEUKOCYTESUR Negative 06/13/2015 1710   Sepsis Labs: @LABRCNTIP (procalcitonin:4,lacticidven:4) ) No results found for this or any previous visit (from the past 240 hour(s)).       Radiology Studies: Dg Chest Port 1 View  Result Date: 05/16/2017 CLINICAL DATA:  Shortness of breath.  Hypoxia. EXAM: PORTABLE CHEST 1 VIEW COMPARISON:  Two-view chest x-ray 05/13/2016. FINDINGS: The heart is enlarged. Diffuse interstitial and airspace disease is again seen. Consolidation is worse on the right than left base. Bilateral effusions are suspected. IMPRESSION: 1. Bilateral interstitial and airspace disease compatible with multi lobar pneumonia. 2. Bilateral pleural effusions. 3. Underlying edema is likely present as well. Electronically Signed   By: Marin Robertshristopher  Mattern M.D.   On: 05/16/2017 08:34      Scheduled Meds: . amiodarone  200 mg Oral BID  . amLODipine  2.5 mg Oral Daily  . dextrose  25 mL Intravenous Once  . DULoxetine  30 mg Oral Daily  . feeding supplement (GLUCERNA SHAKE)  237 mL Oral TID BM  . insulin aspart  0-9 Units Subcutaneous TID WC  . insulin aspart  2 Units Subcutaneous TID WC  . insulin glargine  6 Units Subcutaneous Daily  . levothyroxine  50 mcg Oral QAC breakfast  . linaclotide  290 mcg Oral QAC breakfast  . metoCLOPramide (REGLAN) injection  5 mg Intravenous TID AC  . metoprolol tartrate  50 mg Oral BID  . pantoprazole  40 mg Oral BID AC  . rivaroxaban  15 mg Oral Q supper  . rosuvastatin  5 mg Oral Daily  . sodium chloride flush  3 mL Intravenous Q12H   Continuous Infusions: . sodium chloride    . piperacillin-tazobactam (ZOSYN)  IV Stopped (05/17/17 0828)     LOS: 12 days    Time spent in minutes: 45    Calvert CantorSaima Genevie Elman, MD [facility] Pager: [email or web address] Password [email or web address] 05/17/2017, 11:52 AM

## 2017-05-17 NOTE — Progress Notes (Signed)
Patient ambulated to doorway of room on 4L via nasal cannula and her oxygen level dropped to 84%. Patient returned to bed and place on 4L high flow and her oxygen level slowly increased to 96%.

## 2017-05-18 ENCOUNTER — Inpatient Hospital Stay (HOSPITAL_COMMUNITY): Payer: Medicare HMO

## 2017-05-18 ENCOUNTER — Telehealth: Payer: Self-pay | Admitting: Gastroenterology

## 2017-05-18 ENCOUNTER — Encounter (HOSPITAL_COMMUNITY): Payer: Self-pay

## 2017-05-18 DIAGNOSIS — R1310 Dysphagia, unspecified: Secondary | ICD-10-CM

## 2017-05-18 LAB — GRAM STAIN: Gram Stain: NONE SEEN

## 2017-05-18 LAB — GLUCOSE, CAPILLARY
GLUCOSE-CAPILLARY: 190 mg/dL — AB (ref 65–99)
GLUCOSE-CAPILLARY: 182 mg/dL — AB (ref 65–99)
GLUCOSE-CAPILLARY: 121 mg/dL — AB (ref 65–99)
GLUCOSE-CAPILLARY: 104 mg/dL — AB (ref 65–99)
Glucose-Capillary: 152 mg/dL — ABNORMAL HIGH (ref 65–99)

## 2017-05-18 LAB — BASIC METABOLIC PANEL
Anion gap: 12 (ref 5–15)
BUN: 51 mg/dL — ABNORMAL HIGH (ref 6–20)
CHLORIDE: 100 mmol/L — AB (ref 101–111)
CO2: 32 mmol/L (ref 22–32)
Calcium: 7.7 mg/dL — ABNORMAL LOW (ref 8.9–10.3)
Creatinine, Ser: 1.83 mg/dL — ABNORMAL HIGH (ref 0.44–1.00)
GFR calc non Af Amer: 26 mL/min — ABNORMAL LOW (ref >60)
GFR, EST AFRICAN AMERICAN: 30 mL/min — AB (ref >60)
Glucose, Bld: 184 mg/dL — ABNORMAL HIGH (ref 65–99)
POTASSIUM: 4.2 mmol/L (ref 3.5–5.1)
SODIUM: 144 mmol/L (ref 135–145)

## 2017-05-18 LAB — BODY FLUID CELL COUNT WITH DIFFERENTIAL
EOS FL: 0 %
LYMPHS FL: 34 %
MONOCYTE-MACROPHAGE-SEROUS FLUID: 50 % (ref 50–90)
NEUTROPHIL FLUID: 16 % (ref 0–25)
WBC FLUID: 219 uL (ref 0–1000)

## 2017-05-18 LAB — CBC
HEMATOCRIT: 35 % — AB (ref 36.0–46.0)
HEMOGLOBIN: 10.7 g/dL — AB (ref 12.0–15.0)
MCH: 30.6 pg (ref 26.0–34.0)
MCHC: 30.6 g/dL (ref 30.0–36.0)
MCV: 100 fL (ref 78.0–100.0)
Platelets: 333 10*3/uL (ref 150–400)
RBC: 3.5 MIL/uL — ABNORMAL LOW (ref 3.87–5.11)
RDW: 13.6 % (ref 11.5–15.5)
WBC: 12.9 10*3/uL — AB (ref 4.0–10.5)

## 2017-05-18 LAB — LACTATE DEHYDROGENASE, PLEURAL OR PERITONEAL FLUID: LD, Fluid: 109 U/L — ABNORMAL HIGH (ref 3–23)

## 2017-05-18 LAB — PROTEIN, PLEURAL OR PERITONEAL FLUID: Total protein, fluid: <3 g/dL

## 2017-05-18 LAB — STREP PNEUMONIAE URINARY ANTIGEN: Strep Pneumo Urinary Antigen: NEGATIVE

## 2017-05-18 LAB — GLUCOSE, PLEURAL OR PERITONEAL FLUID: GLUCOSE FL: 184 mg/dL

## 2017-05-18 LAB — MAGNESIUM: MAGNESIUM: 2 mg/dL (ref 1.7–2.4)

## 2017-05-18 LAB — BRAIN NATRIURETIC PEPTIDE: B NATRIURETIC PEPTIDE 5: 429 pg/mL — AB (ref 0.0–100.0)

## 2017-05-18 MED ORDER — LEVOFLOXACIN IN D5W 500 MG/100ML IV SOLN
500.0000 mg | INTRAVENOUS | Status: DC
  Administered 2017-05-18: 500 mg via INTRAVENOUS
  Filled 2017-05-18: qty 100

## 2017-05-18 NOTE — Procedures (Signed)
PreOperative Dx: RIGHT pleural effusion Postoperative Dx: RIGHT pleural effusion Procedure:   US guided RIGHT thoracentesis Radiologist:  Ario Mcdiarmid Anesthesia:  10 ml of 1% lidocaine Specimen:  530 mL of yellow colored fluid EBL:   < 1 ml Complications: None  

## 2017-05-18 NOTE — Progress Notes (Signed)
PROGRESS NOTE    Ellen Woods   Woods:[id number]:[id number]  DOB: 08/18/1940  DOA: 05/05/2017 PCP: Dettinger, Elige RadonJoshua A, MD   Brief Narrative:  Ellen Woods is a 77 y.o. female with medical history significant of COPD, coronary artery disease, diabetes mellitus type 2, dyspnea on exertion who presented to the emergency department with palpitations and facial swelling. In the ER she was found to have A-fib with RVR and pulmonary edema.  Subjective:  She states she was eating grits today and ended up regurgitating and vomited up breakfast. No complaint of dyspnea or cough.  Assessment & Plan:   Principal Problem:   Atrial fibrillation with RVR - underwent TEE cardioversion on 1/8 - CHA2DS2-VASc Score 5 - being managed by cardiology with Amiodarone and Lopressor - has been on Coumadin but was put on hold for EGD -  resume NOAC per cardiology recommendations  Active Problems: Nausea/ vomiting- esophageal stricture - occasional dysphagia and regurgitation of food per daughter  - upper GI series shows severe impairment of esophageal motility, a narrowing at the GE junction, a hiatal hernia and possible GOO vs gastroparesis - she needed esophageal dilation about 15 yrs ago  - 1/11- EGD did not reveal any stricture- GI junction was "empirically dilated"  In case she has achalasia - her regurgitation symptoms are likely due to severe esophageal dysmotility and a hiatal hernia- she had another episode of regurgitation and vomiting this AM    Dyspnea/ acute resp distress- due to acute d CHF and or b/l lobar pneumonia - 1/9-  RR is in 20s on my exam and there are crackles at bases -  - per patient, she has had a cough for about 1 wk - CXR >>- is not clear on whether this is pneumonia or CHF  - started on lasix and Levaquin  - 1/10- mild improvement noted- she states cough is gone but she is still hypoxic- cont to diurese and treat with Levaquin and attempt to wean O2 1/11-  symptoms appear to be improving- she is not dyspneic today- wean O2- cont diuretics and antibiotics 1/12- repeat CXR shows bilateral multifocal pneumonia which is quite extensive in the right lung base- ? If she aspirated in relation to above esophageal dysmotility- will change Levaquin to Zosyn today- she still has extensive crackles on exam and is still requiring O2 - 1/13- cont current management with anibiotics- wean O2 as able - 1/14- continues to be hypoxic- WBC getting worse- have obtained a CT of the chest which reveals a mod-large right sided pleural effusion which was not seen on the CXR - have asked for a thoracentesis- 530 cc of yellow fluid obtained - will give a dose of Levaquin in case we are missing an atypical organism with Zosyn- f/u pleural fluid results  Severe constipation  - on Linzess - not much stool output after suppositories- she has not had much to eat in the hospital and may not have much stool to pass- follow    Chronic diastolic heart failure  - see above - if pleural fluid looks transudative, will resume Lasix today- CT however is more consistent for infection rather than pulmonary edema  CKD 3-4   - follow  Hypokalemia - replace    Diabetes type 2, uncontrolled - cont insulin - sugar was a little low yesterday evening- her oral intake must be erratic- we are being careful with insluin    Hypothyroidism - Synthroid     DVT prophylaxis:  SCDs Code Status: Full code Family Communication: daughter Disposition Plan:  Consultants:   Cardiology  GI Procedures:  2 D ECHO Left ventricle: The cavity size was normal. Wall thickness was   normal. Systolic function was normal. The estimated ejection   fraction was in the range of 60% to 65%. Wall motion was normal;   there were no regional wall motion abnormalities. - Aortic valve: Mildly calcified annulus. Trileaflet; normal   thickness leaflets. Valve area (VTI): 1.87 cm^2. - Mitral valve: There was  mild regurgitation. - Left atrium: The atrium was moderately dilated. - Right atrium: The atrium was mildly dilated. - Atrial septum: No defect or patent foramen ovale was identified. - Pulmonary arteries: Systolic pressure was mildly to moderately   increased. PA peak pressure: 40 mm Hg (S).  TEE cardioversion  Antimicrobials:  Anti-infectives (From admission, onward)   Start     Dose/Rate Route Frequency Ordered Stop   05/18/17 0815  levofloxacin (LEVAQUIN) IVPB 500 mg  Status:  Discontinued     500 mg 100 mL/hr over 60 Minutes Intravenous Every 48 hours 05/18/17 0748 05/18/17 1048   05/16/17 1030  piperacillin-tazobactam (ZOSYN) IVPB 3.375 g     3.375 g 12.5 mL/hr over 240 Minutes Intravenous Every 8 hours 05/16/17 1008     05/15/17 1800  levofloxacin (LEVAQUIN) IVPB 500 mg  Status:  Discontinued     500 mg 100 mL/hr over 60 Minutes Intravenous Every 48 hours 05/15/17 1204 05/16/17 0941   05/13/17 1800  levofloxacin (LEVAQUIN) IVPB 750 mg  Status:  Discontinued     750 mg 100 mL/hr over 90 Minutes Intravenous Every 48 hours 05/13/17 1610 05/15/17 1204       Objective: Vitals:   05/18/17 0300 05/18/17 0400 05/18/17 1056 05/18/17 1117  BP:  (!) 126/47 (!) 120/48 119/64  Pulse:  65 66 66  Resp:  17 (!) 22 (!) 22  Temp:  98.4 F (36.9 C)    TempSrc:  Oral    SpO2:  94% (!) 88% 90%  Weight: 57.5 kg (126 lb 12.2 oz)     Height:        Intake/Output Summary (Last 24 hours) at 05/18/2017 1143 Last data filed at 05/18/2017 0900 Gross per 24 hour  Intake 753 ml  Output 500 ml  Net 253 ml   Filed Weights   05/16/17 0340 05/17/17 0507 05/18/17 0300  Weight: 55.5 kg (122 lb 6.4 oz) 51.9 kg (114 lb 6.7 oz) 57.5 kg (126 lb 12.2 oz)    Examination: General exam: Appears comfortable  HEENT: PERRLA, oral mucosa moist, no sclera icterus or thrush Respiratory system: persistent crackles in b/l lung fields and mostly in the right base today- not as severe as yesterday normal  resp effort Cardiovascular system: S1 & S2 heard, RRR.  No murmurs  Gastrointestinal system: Abdomen soft, non-tender, nondistended. Normal bowel sound. No organomegaly Central nervous system: Alert and oriented. No focal neurological deficits. Extremities: No cyanosis, clubbing or edema Skin: No rashes or ulcers Psychiatry:  Mood & affect appropriate.     Data Reviewed: I have personally reviewed following labs and imaging studies  CBC: Recent Labs  Lab 05/12/17 0418 05/13/17 0428 05/17/17 0551 05/18/17 0446  WBC 8.3 9.8 10.7* 12.9*  HGB 11.2* 10.7* 10.7* 10.7*  HCT 36.2 34.8* 34.8* 35.0*  MCV 99.7 100.0 100.6* 100.0  PLT 223 254 310 333   Basic Metabolic Panel: Recent Labs  Lab 05/13/17 0428 05/14/17 0830 05/15/17 1022 05/16/17 0555 05/17/17  4098 05/17/17 1230 05/18/17 0446  NA 141  --  144 144 140  --  144  K 3.5  --  2.8* 3.0* 3.1*  --  4.2  CL 102  --  100* 99* 96*  --  100*  CO2 26  --  28 28 28   --  32  GLUCOSE 133* 441* 176* 94 217*  --  184*  BUN 34*  --  54* 49* 47*  --  51*  CREATININE 1.51*  --  1.90* 1.81* 1.83*  --  1.83*  CALCIUM 7.9*  --  8.2* 7.7* 7.4*  --  7.7*  MG  --   --   --   --   --  2.2 2.0   GFR: Estimated Creatinine Clearance: 20.2 mL/min (A) (by C-G formula based on SCr of 1.83 mg/dL (H)). Liver Function Tests: Recent Labs  Lab 05/13/17 0428  AST 24  ALT 21  ALKPHOS 64  BILITOT 0.9  PROT 6.2*  ALBUMIN 2.8*   No results for input(s): LIPASE, AMYLASE in the last 168 hours. No results for input(s): AMMONIA in the last 168 hours. Coagulation Profile: Recent Labs  Lab 05/13/17 0934 05/14/17 0409 05/15/17 0525 05/16/17 0555 05/17/17 0511  INR 2.05 1.93 1.96 2.68 3.27   Cardiac Enzymes: No results for input(s): CKTOTAL, CKMB, CKMBINDEX, TROPONINI in the last 168 hours. BNP (last 3 results) No results for input(s): PROBNP in the last 8760 hours. HbA1C: No results for input(s): HGBA1C in the last 72 hours. CBG: Recent  Labs  Lab 05/17/17 0738 05/17/17 1201 05/17/17 1630 05/17/17 2123 05/18/17 0733  GLUCAP 210* 201* 191* 91 182*   Lipid Profile: No results for input(s): CHOL, HDL, LDLCALC, TRIG, CHOLHDL, LDLDIRECT in the last 72 hours. Thyroid Function Tests: No results for input(s): TSH, T4TOTAL, FREET4, T3FREE, THYROIDAB in the last 72 hours. Anemia Panel: No results for input(s): VITAMINB12, FOLATE, FERRITIN, TIBC, IRON, RETICCTPCT in the last 72 hours. Urine analysis:    Component Value Date/Time   COLORURINE YELLOW 03/14/2014 1227   APPEARANCEUR CLEAR 03/14/2014 1227   LABSPEC 1.015 03/14/2014 1227   PHURINE 5.0 03/14/2014 1227   GLUCOSEU 100 (A) 03/14/2014 1227   HGBUR NEGATIVE 03/14/2014 1227   BILIRUBINUR neg 06/13/2015 1710   KETONESUR NEGATIVE 03/14/2014 1227   PROTEINUR neg 06/13/2015 1710   PROTEINUR NEGATIVE 03/14/2014 1227   UROBILINOGEN negative 06/13/2015 1710   UROBILINOGEN 0.2 03/14/2014 1227   NITRITE neg 06/13/2015 1710   NITRITE NEGATIVE 03/14/2014 1227   LEUKOCYTESUR Negative 06/13/2015 1710   Sepsis Labs: @LABRCNTIP (procalcitonin:4,lacticidven:4) ) No results found for this or any previous visit (from the past 240 hour(s)).       Radiology Studies: Ct Chest Wo Contrast  Result Date: 05/18/2017 CLINICAL DATA:  77 year old female with hypoxia and respiratory failure of unknown etiology. COPD. Patient presented initially in atrial fibrillation with rapid ventricular response. Underwent cardioversion on 05/12/2017. Recent low-grade fever. CHF versus pneumonia. On empiric antibiotics. EXAM: CT CHEST WITHOUT CONTRAST TECHNIQUE: Multidetector CT imaging of the chest was performed following the standard protocol without IV contrast. COMPARISON:  Chest radiographs 05/16/2017 and earlier. CTA Abdomen and Pelvis 03/14/2014. FINDINGS: Cardiovascular: Chronic mild cardiomegaly. Areas of chronic pericardial thickening and confluent pericardial calcification with no pericardial  effusion. Calcified coronary artery atherosclerosis and/or stents. Vascular patency is not evaluated in the absence of IV contrast. Mediastinum/Nodes: Mildly fluid distended thoracic esophagus. Small to moderate size gastric hiatal hernia appears progressed since 2,015. Small bilateral mediastinal lymph nodes are  within normal limits. No mediastinal lymphadenopathy. Lungs/Pleura: Moderate to large layering right pleural effusion. Trace left pleural fluid. Bilateral scattered confluent pulmonary opacity is mostly ground-glass type and peribronchial, although areas of peripheral/subpleural upper lobe involvement are noted. No definite air bronchograms or consolidation. Atelectatic appearance of the trachea and central pulmonary airways. Small volume of retained secretions suspected in the trachea on series 4, image 33. Upper Abdomen: Retained barium type contrast in the large bowel flexures which results in streak artifact through the upper abdomen. Chronic calcified granulomas in the liver and spleen. Although there is a paucity of thoracic aortic calcified atherosclerosis there is moderate to extensive calcified plaque in the upper abdominal aorta and major mesenteric branches. Possible SMA stent. No upper abdominal free fluid. 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 Osteopenia. Mild pectus excavatum. No acute osseous abnormality identified. IMPRESSION: 1. Moderate to large layering right pleural effusion. 2. Diffuse bilateral confluent but nonspecific pulmonary opacity, mostly ground-glass type. The appearance more resembles widespread pulmonary infection or inflammation rather than asymmetric pulmonary edema. Consider viral/atypical pneumonia as well as noninfectious inflammatory processes - such as pulmonary vasculitis. 3. Chronic calcified pericarditis. No pericardial effusion. Mild  cardiomegaly. 4. Mild fluid distension of the thoracic esophagus with moderate hiatal hernia which has progressed since 2015. 5. Calcified coronary artery atherosclerosis and/or stents. Abdominal Aortic Atherosclerosis (ICD10-I70.0). Electronically Signed   By: Odessa Fleming M.D.   On: 05/18/2017 09:38      Scheduled Meds: . amiodarone  200 mg Oral BID  . amLODipine  2.5 mg Oral Daily  . dextrose  25 mL Intravenous Once  . DULoxetine  30 mg Oral Daily  . feeding supplement (GLUCERNA SHAKE)  237 mL Oral TID BM  . insulin aspart  0-9 Units Subcutaneous TID WC  . insulin aspart  2 Units Subcutaneous TID WC  . insulin glargine  6 Units Subcutaneous Daily  . levothyroxine  50 mcg Oral QAC breakfast  . linaclotide  290 mcg Oral QAC breakfast  . metoCLOPramide (REGLAN) injection  5 mg Intravenous TID AC  . metoprolol tartrate  50 mg Oral BID  . pantoprazole  40 mg Oral BID AC  . rosuvastatin  5 mg Oral Daily  . sodium chloride flush  3 mL Intravenous Q12H   Continuous Infusions: . sodium chloride    . piperacillin-tazobactam (ZOSYN)  IV Stopped (05/18/17 0940)     LOS: 13 days    Time spent in minutes: 35    Calvert Cantor, MD [facility] Pager: [email or web address] Password [facility] 05/18/2017, 11:43 AM

## 2017-05-18 NOTE — Progress Notes (Signed)
PT Cancellation Note  Patient Details Name: Ellen HaverGlenda J Woods MRN: [id number] DOB: 08/03/1940   Cancelled Treatment:    Reason Eval/Treat Not Completed: Medical issues which prohibited therapy. Patient presents with c/o nausea and vomiting and physical therapy held - RN notified.    3:10 PM, 05/18/17 Ocie BobJames Demitris Pokorny, MPT Physical Therapist with [facility] 336 [phone] office [phone] mobile phone

## 2017-05-18 NOTE — Telephone Encounter (Signed)
[facility] clinical pool: currently inpatient. [place]. Reason: dysphagia, concern for achalasia.

## 2017-05-18 NOTE — Telephone Encounter (Signed)
Referral placed to Dr. Elana AlmNandigam's office. They will contact patient to schedule.

## 2017-05-18 NOTE — Progress Notes (Signed)
Thoracentesis complete no signs of distress, yellow colored pleural fluid removed.

## 2017-05-18 NOTE — Progress Notes (Signed)
    Subjective: Ate 1/2 grits this morning and states she regurgitated this. Poor appetite.   Objective: Vital signs in last 24 hours: Temp:  [98 F (36.7 C)-98.7 F (37.1 C)] 98.4 F (36.9 C) (01/14 0400) Pulse Rate:  [61-87] 65 (01/14 0400) Resp:  [16-20] 17 (01/14 0400) BP: (117-126)/(45-62) 126/47 (01/14 0400) SpO2:  [94 %-98 %] 94 % (01/14 0400) Weight:  [126 lb 12.2 oz (57.5 kg)] 126 lb 12.2 oz (57.5 kg) (01/14 0300) Last BM Date: 05/13/17 General:   Alert and oriented, pleasant Head:  Normocephalic and atraumatic. Abdomen:  Bowel sounds present, soft, non-tender, non-distended. Neurologic:  Alert and  oriented x4 Psych:  Cooperative, flat affect.   Intake/Output from previous day: 01/13 0701 - 01/14 0700 In: 753 [P.O.:600; I.V.:3; IV Piggyback:150] Out: 800 [Urine:800] Intake/Output this shift: No intake/output data recorded.  Lab Results: Recent Labs    05/17/17 0551 05/18/17 0446  WBC 10.7* 12.9*  HGB 10.7* 10.7*  HCT 34.8* 35.0*  PLT 310 333   BMET Recent Labs    05/16/17 0555 05/17/17 0551 05/18/17 0446  NA 144 140 144  K 3.0* 3.1* 4.2  CL 99* 96* 100*  CO2 28 28 32  GLUCOSE 94 217* 184*  BUN 49* 47* 51*  CREATININE 1.81* 1.83* 1.83*  CALCIUM 7.7* 7.4* 7.7*   PT/INR Recent Labs    05/16/17 0555 05/17/17 0511  LABPROT 28.3* 33.1*  INR 2.68 3.27    Assessment: 77 year old female admitted with afib RVR,developing N/V during admission.TEEwith cardioversion 1/7 successful. Underwent EGD 1/11 with no mechanical esophageal obstruction, empiric dilation performed. Known diffuse impairment of esophageal motility, UGI on file this admission. Concern for achalasia. Notes regurgitation of grits this morning. Anticipate she will need manometry. In interim, continue dysphagia 2 diet and PPI.    Plan: Dysphagia 2 diet PPI BID Reglan low dose TID Continue feeding supplement Anticipate manometry   Gelene MinkAnna W. Keric Zehren, PhD, ANP-BC [facility]    LOS: 13 days    05/18/2017, 8:05 AM

## 2017-05-19 LAB — RESPIRATORY PANEL BY PCR
ADENOVIRUS-RVPPCR: NOT DETECTED
Bordetella pertussis: NOT DETECTED
CORONAVIRUS 229E-RVPPCR: NOT DETECTED
CORONAVIRUS HKU1-RVPPCR: NOT DETECTED
CORONAVIRUS NL63-RVPPCR: NOT DETECTED
CORONAVIRUS OC43-RVPPCR: NOT DETECTED
Chlamydophila pneumoniae: NOT DETECTED
Influenza A: NOT DETECTED
Influenza B: NOT DETECTED
METAPNEUMOVIRUS-RVPPCR: NOT DETECTED
Mycoplasma pneumoniae: NOT DETECTED
PARAINFLUENZA VIRUS 1-RVPPCR: NOT DETECTED
PARAINFLUENZA VIRUS 2-RVPPCR: NOT DETECTED
PARAINFLUENZA VIRUS 3-RVPPCR: NOT DETECTED
Parainfluenza Virus 4: NOT DETECTED
Respiratory Syncytial Virus: NOT DETECTED
Rhinovirus / Enterovirus: NOT DETECTED

## 2017-05-19 LAB — GLUCOSE, CAPILLARY
GLUCOSE-CAPILLARY: 170 mg/dL — AB (ref 65–99)
Glucose-Capillary: 146 mg/dL — ABNORMAL HIGH (ref 65–99)
Glucose-Capillary: 161 mg/dL — ABNORMAL HIGH (ref 65–99)
Glucose-Capillary: 71 mg/dL (ref 65–99)

## 2017-05-19 LAB — CBC
HEMATOCRIT: 32.4 % — AB (ref 36.0–46.0)
Hemoglobin: 9.9 g/dL — ABNORMAL LOW (ref 12.0–15.0)
MCH: 30.3 pg (ref 26.0–34.0)
MCHC: 30.6 g/dL (ref 30.0–36.0)
MCV: 99.1 fL (ref 78.0–100.0)
Platelets: 312 10*3/uL (ref 150–400)
RBC: 3.27 MIL/uL — ABNORMAL LOW (ref 3.87–5.11)
RDW: 13.8 % (ref 11.5–15.5)
WBC: 11.5 10*3/uL — AB (ref 4.0–10.5)

## 2017-05-19 LAB — BASIC METABOLIC PANEL
ANION GAP: 12 (ref 5–15)
BUN: 48 mg/dL — AB (ref 6–20)
CALCIUM: 7.2 mg/dL — AB (ref 8.9–10.3)
CO2: 30 mmol/L (ref 22–32)
Chloride: 99 mmol/L — ABNORMAL LOW (ref 101–111)
Creatinine, Ser: 1.79 mg/dL — ABNORMAL HIGH (ref 0.44–1.00)
GFR calc Af Amer: 31 mL/min — ABNORMAL LOW (ref >60)
GFR calc non Af Amer: 26 mL/min — ABNORMAL LOW (ref >60)
GLUCOSE: 159 mg/dL — AB (ref 65–99)
Potassium: 3.3 mmol/L — ABNORMAL LOW (ref 3.5–5.1)
Sodium: 141 mmol/L (ref 135–145)

## 2017-05-19 LAB — SEDIMENTATION RATE: SED RATE: 121 mm/h — AB (ref 0–22)

## 2017-05-19 LAB — ANTINUCLEAR ANTIBODIES, IFA: ANTINUCLEAR ANTIBODIES, IFA: NEGATIVE

## 2017-05-19 LAB — C-REACTIVE PROTEIN: CRP: 23.9 mg/dL — ABNORMAL HIGH (ref <1.0)

## 2017-05-19 MED ORDER — FUROSEMIDE 10 MG/ML IJ SOLN
40.0000 mg | Freq: Two times a day (BID) | INTRAMUSCULAR | Status: DC
  Administered 2017-05-19 – 2017-05-21 (×5): 40 mg via INTRAVENOUS
  Filled 2017-05-19 (×5): qty 4

## 2017-05-19 MED ORDER — BISACODYL 10 MG RE SUPP: 10.0000 mg | Freq: Every day | RECTAL | Status: DC | PRN

## 2017-05-19 MED ORDER — DOXYCYCLINE HYCLATE 100 MG IV SOLR
100.0000 mg | Freq: Two times a day (BID) | INTRAVENOUS | Status: DC
  Administered 2017-05-19 – 2017-05-20 (×3): 100 mg via INTRAVENOUS
  Filled 2017-05-19 (×5): qty 100

## 2017-05-19 NOTE — Progress Notes (Addendum)
PROGRESS NOTE    Ellen Woods   NTZ:[id number]  DOB: 1940/10/21  DOA: 05/05/2017 PCP: Dettinger, Fransisca Kaufmann, MD   Brief Narrative:  Ellen Woods is a 77 y.o. female with medical history significant of COPD, coronary artery disease, diabetes mellitus type 2, dyspnea on exertion who presented to the emergency department with palpitations and facial swelling. In the ER she was found to have A-fib with RVR and pulmonary edema. She has had a prolonged hospital stay. She underwent cardioversion on 1/8 and has been in NSR since.  She developed acute respiratory failure which we are still trying to resolve- see below.  She had complaints of dysphagia, regurgitation and vomiting which were evaluated with an EGD and an esophagram.  She continues to complain of vomiting and regurgitation. Please note that the patient is a poor historian and is difficult to get a full history out of her without asking multiple questions repeatedly.   Subjective: She has not eaten much because she is afraid the food will come back up. She states she did regurgitate on dinner yesterday and then decided to stop eating. She has no complaints of dyspnea or cough.   Assessment & Plan:   Principal Problem:   Atrial fibrillation with RVR - underwent TEE cardioversion on 1/8 - CHA2DS2-VASc Score 5 - being managed by cardiology with Amiodarone and Lopressor - has been on Coumadin but was put on hold for EGD -  on NOAC now  Active Problems: Nausea/ vomiting- esophageal stricture - occasional dysphagia and regurgitation of food per daughter  - upper GI series shows severe impairment of esophageal motility, a narrowing at the GE junction, a hiatal hernia and possible GOO vs gastroparesis - she needed esophageal dilation about 15 yrs ago  - 1/11- EGD did not reveal any stricture- GI junction was "empirically dilated"  In case she has achalasia - her regurgitation symptoms are likely due to severe  esophageal dysmotility and a hiatal hernia- she continues to have episodes of regurgitation and vomiting      Dyspnea/ acute resp distress- due to acute d CHF and or b/l lobar pneumonia - 1/9-  RR is in 20s on my exam and there are crackles at bases -  - per patient, she has had a cough for about 1 wk - CXR >>- is not clear on whether this is pneumonia or CHF  - started on lasix and Levaquin  - 1/10- mild improvement noted- she states cough is gone but she is still hypoxic- cont to diurese and treat with Levaquin and attempt to wean O2 1/11- symptoms appear to be improving- she is not dyspneic today- wean O2- cont diuretics and antibiotics 1/12- repeat CXR shows bilateral multifocal pneumonia which is quite extensive in the right lung base- ? If she aspirated in relation to above esophageal dysmotility- will change Levaquin to Zosyn today- she still has extensive crackles on exam and is still requiring O2- lasix held  - 1/13- cont current management with anibiotics- wean O2 as able - 1/14- continues to be hypoxic- WBC getting worse- have obtained a CT of the chest which reveals a mod-large right sided pleural effusion which was not seen on the CXR and multiple infiltrates- difficult to tell if this is CHF or an atypical infection or autoimmune - have asked for a thoracentesis - 1/14- given a dose of Levaquin in case we are missing an atypical organism with Zosyn -- 530 cc of yellow fluid obtained- pleural  fluid results reveal a transudate and lasix has been resumed - cont to follow- have asked for pulmonary opinion-  - in regards to an autoimmune etiology, ANA is pending - ESR noted to be high which is really nonspecific and can mean autoimmune disorder or infection - will place on Doxy to cover Atypicals - note: she is only able to pull about 200-250 cc with the incentive spirometer which I educated her on using regularly today  Severe constipation  - on Linzess - not much stool output after  suppositories- she has not had much to eat in the hospital and may not have much stool to pass- follow    Chronic diastolic heart failure  - see above -pleural fluid looks like transudate - Lasix resumed- follow I and O and daily weights  CKD 3-4   - follow  Hypokalemia - replace    Diabetes type 2, uncontrolled - cont insulin - sugar was a little low at one poing- her oral intake is erratic- we are being careful with insluin    Hypothyroidism - Synthroid     DVT prophylaxis: SCDs Code Status: Full code Family Communication: daughter Disposition Plan:  Consultants:   Cardiology  GI Procedures:  2 D ECHO Left ventricle: The cavity size was normal. Wall thickness was   normal. Systolic function was normal. The estimated ejection   fraction was in the range of 60% to 65%. Wall motion was normal;   there were no regional wall motion abnormalities. - Aortic valve: Mildly calcified annulus. Trileaflet; normal   thickness leaflets. Valve area (VTI): 1.87 cm^2. - Mitral valve: There was mild regurgitation. - Left atrium: The atrium was moderately dilated. - Right atrium: The atrium was mildly dilated. - Atrial septum: No defect or patent foramen ovale was identified. - Pulmonary arteries: Systolic pressure was mildly to moderately   increased. PA peak pressure: 40 mm Hg (S).  TEE cardioversion  Antimicrobials:  Anti-infectives (From admission, onward)   Start     Dose/Rate Route Frequency Ordered Stop   05/18/17 0815  levofloxacin (LEVAQUIN) IVPB 500 mg  Status:  Discontinued     500 mg 100 mL/hr over 60 Minutes Intravenous Every 48 hours 05/18/17 0748 05/18/17 1048   05/16/17 1030  piperacillin-tazobactam (ZOSYN) IVPB 3.375 g     3.375 g 12.5 mL/hr over 240 Minutes Intravenous Every 8 hours 05/16/17 1008     05/15/17 1800  levofloxacin (LEVAQUIN) IVPB 500 mg  Status:  Discontinued     500 mg 100 mL/hr over 60 Minutes Intravenous Every 48 hours 05/15/17 1204 05/16/17  0941   05/13/17 1800  levofloxacin (LEVAQUIN) IVPB 750 mg  Status:  Discontinued     750 mg 100 mL/hr over 90 Minutes Intravenous Every 48 hours 05/13/17 1610 05/15/17 1204       Objective: Vitals:   05/18/17 1500 05/18/17 2118 05/19/17 0511 05/19/17 0845  BP: (!) 97/42 (!) 122/48 (!) 127/47   Pulse: 65 64 62   Resp: 18 16 14    Temp: 98.2 F (36.8 C) 98.8 F (37.1 C) 98.3 F (36.8 C)   TempSrc: Oral Oral Oral   SpO2: 90% 92% 94% 95%  Weight:   59.8 kg (131 lb 13.4 oz)   Height:        Intake/Output Summary (Last 24 hours) at 05/19/2017 1247 Last data filed at 05/19/2017 1240 Gross per 24 hour  Intake 340 ml  Output 650 ml  Net -310 ml   [employer]  05/17/17 0507 05/18/17 0300 05/19/17 0511  Weight: 51.9 kg (114 lb 6.7 oz) 57.5 kg (126 lb 12.2 oz) 59.8 kg (131 lb 13.4 oz)    Examination: General exam: Appears comfortable  HEENT: PERRLA, oral mucosa moist, no sclera icterus or thrush Respiratory system: persistent crackles in b/l lung fields and mostly in the right base    Cardiovascular system: S1 & S2 heard, RRR.  No murmurs  Gastrointestinal system: Abdomen soft, non-tender, nondistended. Normal bowel sound. No organomegaly Central nervous system: Alert and oriented. No focal neurological deficits. Extremities: No cyanosis, clubbing or edema Skin: No rashes or ulcers Psychiatry:  Woods & affect appropriate.     Data Reviewed: I have personally reviewed following labs and imaging studies  CBC: Recent Labs  Lab 05/13/17 0428 05/17/17 0551 05/18/17 0446 05/19/17 0443  WBC 9.8 10.7* 12.9* 11.5*  HGB 10.7* 10.7* 10.7* 9.9*  HCT 34.8* 34.8* 35.0* 32.4*  MCV 100.0 100.6* 100.0 99.1  PLT 254 310 333 546   Basic Metabolic Panel: Recent Labs  Lab 05/15/17 1022 05/16/17 0555 05/17/17 0551 05/17/17 1230 05/18/17 0446 05/19/17 0443  NA 144 144 140  --  144 141  K 2.8* 3.0* 3.1*  --  4.2 3.3*  CL 100* 99* 96*  --  100* 99*  CO2 28 28 28   --  32 30    GLUCOSE 176* 94 217*  --  184* 159*  BUN 54* 49* 47*  --  51* 48*  CREATININE 1.90* 1.81* 1.83*  --  1.83* 1.79*  CALCIUM 8.2* 7.7* 7.4*  --  7.7* 7.2*  MG  --   --   --  2.2 2.0  --    GFR: Estimated Creatinine Clearance: 21 mL/min (A) (by C-G formula based on SCr of 1.79 mg/dL (H)). Liver Function Tests: Recent Labs  Lab 05/13/17 0428  AST 24  ALT 21  ALKPHOS 64  BILITOT 0.9  PROT 6.2*  ALBUMIN 2.8*   No results for input(s): LIPASE, AMYLASE in the last 168 hours. No results for input(s): AMMONIA in the last 168 hours. Coagulation Profile: Recent Labs  Lab 05/13/17 0934 05/14/17 0409 05/15/17 0525 05/16/17 0555 05/17/17 0511  INR 2.05 1.93 1.96 2.68 3.27   Cardiac Enzymes: No results for input(s): CKTOTAL, CKMB, CKMBINDEX, TROPONINI in the last 168 hours. BNP (last 3 results) No results for input(s): PROBNP in the last 8760 hours. HbA1C: No results for input(s): HGBA1C in the last 72 hours. CBG: Recent Labs  Lab 05/18/17 1148 05/18/17 1629 05/18/17 2116 05/19/17 0751 05/19/17 1203  GLUCAP 121* 104* 152* 146* 170*   Lipid Profile: No results for input(s): CHOL, HDL, LDLCALC, TRIG, CHOLHDL, LDLDIRECT in the last 72 hours. Thyroid Function Tests: No results for input(s): TSH, T4TOTAL, FREET4, T3FREE, THYROIDAB in the last 72 hours. Anemia Panel: No results for input(s): VITAMINB12, FOLATE, FERRITIN, TIBC, IRON, RETICCTPCT in the last 72 hours. Urine analysis:    Component Value Date/Time   COLORURINE YELLOW 03/14/2014 [place] 03/14/2014 1227   LABSPEC 1.015 03/14/2014 1227   PHURINE 5.0 03/14/2014 1227   GLUCOSEU 100 (A) 03/14/2014 1227   HGBUR NEGATIVE 03/14/2014 1227   BILIRUBINUR neg 06/13/2015 1710   KETONESUR NEGATIVE 03/14/2014 1227   PROTEINUR neg 06/13/2015 1710   PROTEINUR NEGATIVE 03/14/2014 1227   UROBILINOGEN negative 06/13/2015 1710   UROBILINOGEN 0.2 03/14/2014 1227   NITRITE neg 06/13/2015 1710   NITRITE NEGATIVE  03/14/2014 1227   LEUKOCYTESUR Negative 06/13/2015 1710  Sepsis Labs: @LABRCNTIP (procalcitonin:4,lacticidven:4) ) Recent Results (from the past 240 hour(s))  Respiratory Panel by PCR     Status: None   Collection Time: 05/18/17 10:12 AM  Result Value Ref Range Status   Adenovirus NOT DETECTED NOT DETECTED Final   Coronavirus 229E NOT DETECTED NOT DETECTED Final   Coronavirus HKU1 NOT DETECTED NOT DETECTED Final   Coronavirus NL63 NOT DETECTED NOT DETECTED Final   Coronavirus OC43 NOT DETECTED NOT DETECTED Final   Metapneumovirus NOT DETECTED NOT DETECTED Final   Rhinovirus / Enterovirus NOT DETECTED NOT DETECTED Final   Influenza A NOT DETECTED NOT DETECTED Final   Influenza B NOT DETECTED NOT DETECTED Final   Parainfluenza Virus 1 NOT DETECTED NOT DETECTED Final   Parainfluenza Virus 2 NOT DETECTED NOT DETECTED Final   Parainfluenza Virus 3 NOT DETECTED NOT DETECTED Final   Parainfluenza Virus 4 NOT DETECTED NOT DETECTED Final   Respiratory Syncytial Virus NOT DETECTED NOT DETECTED Final   Bordetella pertussis NOT DETECTED NOT DETECTED Final   Chlamydophila pneumoniae NOT DETECTED NOT DETECTED Final   Mycoplasma pneumoniae NOT DETECTED NOT DETECTED Final  Gram stain     Status: None   Collection Time: 05/18/17 10:50 AM  Result Value Ref Range Status   Specimen Description PLEURAL  Final   Special Requests NONE  Final   Gram Stain   Final    NO ORGANISMS SEEN WBC PRESENT,BOTH PMN AND MONONUCLEAR CYTOSPIN SMEAR    Report Status 05/18/2017 FINAL  Final         Radiology Studies: Dg Chest 1 View  Result Date: 05/18/2017 CLINICAL DATA:  RIGHT pleural effusion post thoracentesis EXAM: CHEST 1 VIEW COMPARISON:  Chest radiograph 05/16/2017, CT chest 05/18/2017 FINDINGS: Enlargement of cardiac silhouette with pulmonary vascular congestion. Diffuse BILATERAL pulmonary infiltrates, slightly improved in upper lobes. Decreased RIGHT pleural effusion and basilar atelectasis post  thoracentesis. No pneumothorax. Bones demineralized. Retained contrast in colon from recent upper GI exam. IMPRESSION: No pneumothorax following RIGHT thoracentesis. Persistent BILATERAL pulmonary infiltrates, improved in upper lobes since 05/16/2017. Electronically Signed   By: Lavonia Dana M.D.   On: 05/18/2017 12:01   Ct Chest Wo Contrast  Result Date: 05/18/2017 CLINICAL DATA:  77 year old female with hypoxia and respiratory failure of unknown etiology. COPD. Patient presented initially in atrial fibrillation with rapid ventricular response. Underwent cardioversion on 05/12/2017. Recent low-grade fever. CHF versus pneumonia. On empiric antibiotics. EXAM: CT CHEST WITHOUT CONTRAST TECHNIQUE: Multidetector CT imaging of the chest was performed following the standard protocol without IV contrast. COMPARISON:  Chest radiographs 05/16/2017 and earlier. CTA Abdomen and Pelvis 03/14/2014. FINDINGS: Cardiovascular: Chronic mild cardiomegaly. Areas of chronic pericardial thickening and confluent pericardial calcification with no pericardial effusion. Calcified coronary artery atherosclerosis and/or stents. Vascular patency is not evaluated in the absence of IV contrast. Mediastinum/Nodes: Mildly fluid distended thoracic esophagus. Small to moderate size gastric hiatal hernia appears progressed since 2,015. Small bilateral mediastinal lymph nodes are within normal limits. No mediastinal lymphadenopathy. Lungs/Pleura: Moderate to large layering right pleural effusion. Trace left pleural fluid. Bilateral scattered confluent pulmonary opacity is mostly ground-glass type and peribronchial, although areas of peripheral/subpleural upper lobe involvement are noted. No definite air bronchograms or consolidation. Atelectatic appearance of the trachea and central pulmonary airways. Small volume of retained secretions suspected in the trachea on series 4, image 33. Upper Abdomen: Retained barium type contrast in the large bowel  flexures which results in streak artifact through the upper abdomen. Chronic calcified granulomas in the liver and spleen.  Although there is a paucity of thoracic aortic calcified atherosclerosis there is moderate to extensive calcified plaque in the upper abdominal aorta and major mesenteric branches. Possible SMA stent. No upper abdominal free fluid. 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 Osteopenia. Mild pectus excavatum. No acute osseous abnormality identified. IMPRESSION: 1. Moderate to large layering right pleural effusion. 2. Diffuse bilateral confluent but nonspecific pulmonary opacity, mostly ground-glass type. The appearance more resembles widespread pulmonary infection or inflammation rather than asymmetric pulmonary edema. Consider viral/atypical pneumonia as well as noninfectious inflammatory processes - such as pulmonary vasculitis. 3. Chronic calcified pericarditis. No pericardial effusion. Mild cardiomegaly. 4. Mild fluid distension of the thoracic esophagus with moderate hiatal hernia which has progressed since 2015. 5. Calcified coronary artery atherosclerosis and/or stents. Abdominal Aortic Atherosclerosis (ICD10-I70.0). Electronically Signed   By: Genevie Ann M.D.   On: 05/18/2017 09:38   US Thoracentesis Asp Pleural Space W/img Guide  Result Date: 05/18/2017 INDICATION: RIGHT pleural effusion EXAM: ULTRASOUND GUIDED DIAGNOSTIC AND THERAPEUTIC RIGHT THORACENTESIS MEDICATIONS: None. COMPLICATIONS: None immediate. PROCEDURE: Procedure, benefits, and risks of procedure were discussed with patient. Written informed consent for procedure was obtained. Time out protocol followed. Pleural effusion localized by ultrasound at the posterior RIGHT hemithorax. Skin prepped and draped in usual sterile fashion. Skin and soft tissues anesthetized with 10 mL of 1% lidocaine. 8 French  thoracentesis catheter placed into the RIGHT pleural space. 530 mL of clear yellow fluid aspirated by syringe pump. Procedure tolerated well by patient without immediate complication. FINDINGS: A total of approximately 530 mL of RIGHT pleural fluid was removed. Samples were sent to the laboratory as requested by the clinical team. IMPRESSION: Successful ultrasound guided RIGHT thoracentesis yielding 530 mL of pleural fluid. Electronically Signed   By: Lavonia Dana M.D.   On: 05/18/2017 11:42      Scheduled Meds: . amiodarone  200 mg Oral BID  . amLODipine  2.5 mg Oral Daily  . dextrose  25 mL Intravenous Once  . DULoxetine  30 mg Oral Daily  . feeding supplement (GLUCERNA SHAKE)  237 mL Oral TID BM  . furosemide  40 mg Intravenous BID  . insulin aspart  0-9 Units Subcutaneous TID WC  . insulin aspart  2 Units Subcutaneous TID WC  . insulin glargine  6 Units Subcutaneous Daily  . levothyroxine  50 mcg Oral QAC breakfast  . linaclotide  290 mcg Oral QAC breakfast  . metoCLOPramide (REGLAN) injection  5 mg Intravenous TID AC  . metoprolol tartrate  50 mg Oral BID  . pantoprazole  40 mg Oral BID AC  . rosuvastatin  5 mg Oral Daily  . sodium chloride flush  3 mL Intravenous Q12H   Continuous Infusions: . sodium chloride    . piperacillin-tazobactam (ZOSYN)  IV Stopped (05/19/17 1052)     LOS: 14 days    Time spent in minutes: 35    Debbe Odea, MD [facility] Pager: [email or web address] Password [facility] 05/19/2017, 12:47 PM

## 2017-05-19 NOTE — Progress Notes (Signed)
Physical Therapy Treatment Patient Details Name: Ellen HaverGlenda J Zweber MRN: [id number] DOB: 06/03/1940 Today's Date: 05/19/2017    History of Present Illness Ellen Woods is a 77 y.o. female with medical history significant of COPD, coronary artery disease, diabetes mellitus type 2, dyspnea on exertion who presented to the emergency department with palpitations and facial swelling.  Patient's daughter and son are bedside and states that this morning patient began to have swelling on one side of her face and it appeared as though her heart was "beating out of her chest".  They used an at home blood pressure monitor and stated her blood pressure was reasonably controlled but her heart rate was in the 150s.  They gave patient a Xanax and stated that her heart rate came down and the swelling improved.  She presented to the emergency department for further evaluation of her tachycardia.  Patient reports that she has chronic dyspnea on exertion and has told this to her cardiologist who she saw in October 2018 but states that no further workup was ordered for her.  She does mention that she has a history of coronary artery disease with upon reviewing the note from the cardiologist in October she had a negative Lexiscan Myoview.  At that time her ejection fraction was noted to be 78%.  Patient reports no recent sick contacts except for her son and daughter who had gastroenteritis.  She voices that she occasionally has vomiting but this she attributes to her esophagus requiring stretching which she states [place] had [place].  She denies fevers or chills, denies diarrhea or constipation, and denies chest pain.  She states that she thinks her palpitations began last night before she went to bed but she did not measure her heart rate at that time.  According to her problem list patient has a history of paroxysmal atrial fibrillation from 2013 however she cannot recall this incident.  There is a  cardiology note from June 2017 that voices that she had a Holter monitor that demonstrated premature atrial contractions and atrial tachycardia.  She does have a history of a PDA which is small and has been managed medically.    PT Comments    Patient demonstrates increased endurance/tolerance for gait training using RW, no loss of balance, limited secondary to c/o fatigue, O2 sats dropped to 88% while on 3 LPM when ambulating, recovered to 98% while resting in chair.  Patient will benefit from continued physical therapy in hospital and recommended venue below to increase strength, balance, endurance for safe ADLs and gait.    Follow Up Recommendations  Home health PT;Supervision/Assistance - 24 hour     Equipment Recommendations  None recommended by PT    Recommendations for Other Services       Precautions / Restrictions Precautions Precautions: Fall Restrictions Weight Bearing Restrictions: No    Mobility  Bed Mobility Overal bed mobility: Modified Independent                Transfers Overall transfer level: Needs assistance Equipment used: Rolling walker (2 wheeled);Straight cane Transfers: Stand Pivot Transfers;Sit to/from Stand Sit to Stand: Supervision Stand pivot transfers: Supervision          Ambulation/Gait Ambulation/Gait assistance: Min guard Ambulation Distance (Feet): 60 Feet Assistive device: Rolling walker (2 wheeled) Gait Pattern/deviations: Decreased step length - right;Decreased step length - left;Decreased stride length   Gait velocity interpretation: Below normal speed for age/gender General Gait Details: walked about 10  feet using SPC with unsteady cadence, switched to RW and demonstrated improved balance/endurance for gait training, no loss of balance, on 3 LPM O2 with O2 sats drooping to 87%, increased to 98% when resting   Stairs            Wheelchair Mobility    Modified Rankin (Stroke Patients Only)       Balance  Overall balance assessment: Needs assistance Sitting-balance support: No upper extremity supported;Feet unsupported Sitting balance-Leahy Scale: Good     Standing balance support: Single extremity supported;During functional activity Standing balance-Leahy Scale: Fair Standing balance comment: fair/good using RW                            Cognition Arousal/Alertness: Awake/alert Behavior During Therapy: WFL for tasks assessed/performed Overall Cognitive Status: Within Functional Limits for tasks assessed                                        Exercises General Exercises - Lower Extremity Ankle Circles/Pumps: Seated;AROM;Both;10 reps Long Arc Quad: Seated;AROM;Both;10 reps Hip Flexion/Marching: Seated;AROM;Both;10 reps    General Comments        Pertinent Vitals/Pain Pain Assessment: No/denies pain    Home Living                      Prior Function            PT Goals (current goals can now be found in the care plan section) Acute Rehab PT Goals Patient Stated Goal: return home with family to assist PT Goal Formulation: With patient Time For Goal Achievement: 05/27/17 Potential to Achieve Goals: Good Progress towards PT goals: Progressing toward goals    Frequency    Min 3X/week      PT Plan Current plan remains appropriate    Co-evaluation              AM-PAC PT "6 Clicks" Daily Activity  Outcome Measure  Difficulty turning over in bed (including adjusting bedclothes, sheets and blankets)?: None Difficulty moving from lying on back to sitting on the side of the bed? : None Difficulty sitting down on and standing up from a chair with arms (e.g., wheelchair, bedside commode, etc,.)?: A Little Help needed moving to and from a bed to chair (including a wheelchair)?: A Little Help needed walking in hospital room?: A Little Help needed climbing 3-5 steps with a railing? : A Little 6 Click Score: 20    End of  Session Equipment Utilized During Treatment: Oxygen Activity Tolerance: Patient tolerated treatment well;Patient limited by fatigue Patient left: in chair;with call bell/phone within reach(RN aware patient left in chair) Nurse Communication: Mobility status PT Visit Diagnosis: Unsteadiness on feet (R26.81);Other abnormalities of gait and mobility (R26.89);Muscle weakness (generalized) (M62.81)     Time: 1610-9604 PT Time Calculation (min) (ACUTE ONLY): 32 min  Charges:  $Gait Training: 8-22 mins $Therapeutic Exercise: 8-22 mins                    G Codes:       11:11 AM, May 30, 2017 Ocie Bob, MPT Physical Therapist with [facility] [phone] office [phone] mobile phone

## 2017-05-19 NOTE — Care Management Note (Signed)
Case Management Note  Patient Details  Name: Ellen Woods MRN: [id number] Date of Birth: 12/04/1940  If discussed at Long Length of Stay Meetings, dates discussed:  1/15/219  Additional Comments: CM cont to follow. Pt still planning [facility] and home oxygen.  Malcolm Metrohildress, Kaziyah Parkison Demske, RN 05/19/2017, 12:29 PM

## 2017-05-19 NOTE — Telephone Encounter (Signed)
Called to schedule the patient. Person answering the phone states Ellen Woods is presently hospitalized.

## 2017-05-19 NOTE — Consult Note (Addendum)
Consult requested by: [facility] hospitalist, Dr. Butler Denmarkizwan Consult requested for: Respiratory failure abnormal chest CT  HPI: This is a 77 year old who was admitted on January 1 with acute on chronic heart failure and atrial fib with rapid ventricular response.  She has been treated for that and eventually underwent cardioversion during TEE.  She is known to have COPD, coronary disease, diabetes, chronic shortness of breath.  She had problem with vomiting and had significant cough so she may have aspirated.  She had chest CT that showed a pleural effusion that was at least moderate and groundglass opacities that could be from multiple etiologies including inflammatory or infectious lung disease and still could be from volume overload.  She says she feels short of breath.  She is a lifelong non-smoker she says exposures to anything that she is aware of.  She says she thinks her daughter has some pulmonary disease but she is not sure what that is.  She still feels nauseated. No chest pain now.  She is still coughing.  Her cough is nonproductive. Past Medical History:  Diagnosis Date  . Arthritis   . Back pain 1/14   Tx by orthopedics  . COPD (chronic obstructive pulmonary disease) (HCC)   . Coronary atherosclerosis of native coronary artery    50% LAD stenosis  . Encounter for long-term (current) use of insulin (HCC)   . Family history of anesthesia complication    Daughter has nausea  . PDA (patent ductus arteriosus)    Small PDA documented by CT angiography, no pulmonary hypertension  . Pericardial calcification    Ruled out for restrictive pericarditis  . Thyroid disease   . Type II or unspecified type diabetes mellitus without mention of complication, not stated as uncontrolled   . Unspecified essential hypertension      Family History  Problem Relation Age of Onset  . Stroke Mother   . Hypertension Mother   . Diabetes Brother   . Early death Brother   . Heart disease Daughter   . Stroke  Daughter   . Congenital heart disease Daughter   . Diabetes Son 679  . Stroke Brother   . Diabetes Brother 6       type 1     Social History   Socioeconomic History  . Marital status: Widowed    Spouse name: None  . Number of children: 2  . Years of education: None  . Highest education level: None  Social Needs  . Financial resource strain: None  . Food insecurity - worry: None  . Food insecurity - inability: None  . Transportation needs - medical: None  . Transportation needs - non-medical: None  Occupational History  . None  Tobacco Use  . Smoking status: Never Smoker  . Smokeless tobacco: Never Used  Substance and Sexual Activity  . Alcohol use: No  . Drug use: No  . Sexual activity: No  Other Topics Concern  . None  Social History Narrative  . None     ROS: Except as mentioned 10 point review of systems is negative    Objective: Vital signs in last 24 hours: Temp:  [98.2 F (36.8 C)-98.8 F (37.1 C)] 98.3 F (36.8 C) (01/15 0511) Pulse Rate:  [62-66] 62 (01/15 0511) Resp:  [14-22] 14 (01/15 0511) BP: (97-127)/(42-64) 127/47 (01/15 0511) SpO2:  [88 %-95 %] 95 % (01/15 0845) Weight:  [59.8 kg (131 lb 13.4 oz)] 59.8 kg (131 lb 13.4 oz) (01/15 0511) Weight change: 2.3 kg (  5 lb 1.1 oz) Last BM Date: 05/13/17  Intake/Output from previous day: 01/14 0701 - 01/15 0700 In: 560 [P.O.:360; IV Piggyback:200] Out: 250 [Urine:250]  PHYSICAL EXAM Constitutional: She is awake and alert and in no acute distress.  Eyes: Pupils react EOMI.  Ears nose mouth and throat: Mucous membranes are moist.  Throat is clear.  Cardiovascular: She seems to be in sinus rhythm now.  Trace edema.  Respiratory: She has some rhonchi bilaterally and some wheezing on the left.  Gastrointestinal: Her abdomen is soft with no masses.  Skin: Warm and dry.  Musculoskeletal: Grossly normal muscle strength.  Psychiatric: Normal mood and affect neurological: No focal abnormalities  Lab  Results: Basic Metabolic Panel: Recent Labs    05/17/17 1230 05/18/17 0446 05/19/17 0443  NA  --  144 141  K  --  4.2 3.3*  CL  --  100* 99*  CO2  --  32 30  GLUCOSE  --  184* 159*  BUN  --  51* 48*  CREATININE  --  1.83* 1.79*  CALCIUM  --  7.7* 7.2*  MG 2.2 2.0  --    Liver Function Tests: No results for input(s): AST, ALT, ALKPHOS, BILITOT, PROT, ALBUMIN in the last 72 hours. No results for input(s): LIPASE, AMYLASE in the last 72 hours. No results for input(s): AMMONIA in the last 72 hours. CBC: Recent Labs    05/18/17 0446 05/19/17 0443  WBC 12.9* 11.5*  HGB 10.7* 9.9*  HCT 35.0* 32.4*  MCV 100.0 99.1  PLT 333 312   Cardiac Enzymes: No results for input(s): CKTOTAL, CKMB, CKMBINDEX, TROPONINI in the last 72 hours. BNP: No results for input(s): PROBNP in the last 72 hours. D-Dimer: No results for input(s): DDIMER in the last 72 hours. CBG: Recent Labs    05/17/17 2123 05/18/17 0733 05/18/17 1148 05/18/17 1629 05/18/17 2116 05/19/17 0751  GLUCAP 91 182* 121* 104* 152* 146*   Hemoglobin A1C: No results for input(s): HGBA1C in the last 72 hours. Fasting Lipid Panel: No results for input(s): CHOL, HDL, LDLCALC, TRIG, CHOLHDL, LDLDIRECT in the last 72 hours. Thyroid Function Tests: No results for input(s): TSH, T4TOTAL, FREET4, T3FREE, THYROIDAB in the last 72 hours. Anemia Panel: No results for input(s): VITAMINB12, FOLATE, FERRITIN, TIBC, IRON, RETICCTPCT in the last 72 hours. Coagulation: Recent Labs    05/17/17 0511  LABPROT 33.1*  INR 3.27   Urine Drug Screen: Drugs of Abuse  No results found for: LABOPIA, COCAINSCRNUR, LABBENZ, AMPHETMU, THCU, LABBARB  Alcohol Level: No results for input(s): ETH in the last 72 hours. Urinalysis: No results for input(s): COLORURINE, LABSPEC, PHURINE, GLUCOSEU, HGBUR, BILIRUBINUR, KETONESUR, PROTEINUR, UROBILINOGEN, NITRITE, LEUKOCYTESUR in the last 72 hours.  Invalid input(s): APPERANCEUR Misc.  Labs:   ABGS: No results for input(s): PHART, PO2ART, TCO2, HCO3 in the last 72 hours.  Invalid input(s): PCO2   MICROBIOLOGY: Recent Results (from the past 240 hour(s))  Respiratory Panel by PCR     Status: None   Collection Time: 05/18/17 10:12 AM  Result Value Ref Range Status   Adenovirus NOT DETECTED NOT DETECTED Final   Coronavirus 229E NOT DETECTED NOT DETECTED Final   Coronavirus HKU1 NOT DETECTED NOT DETECTED Final   Coronavirus NL63 NOT DETECTED NOT DETECTED Final   Coronavirus OC43 NOT DETECTED NOT DETECTED Final   Metapneumovirus NOT DETECTED NOT DETECTED Final   Rhinovirus / Enterovirus NOT DETECTED NOT DETECTED Final   Influenza A NOT DETECTED NOT DETECTED Final   Influenza  B NOT DETECTED NOT DETECTED Final   Parainfluenza Virus 1 NOT DETECTED NOT DETECTED Final   Parainfluenza Virus 2 NOT DETECTED NOT DETECTED Final   Parainfluenza Virus 3 NOT DETECTED NOT DETECTED Final   Parainfluenza Virus 4 NOT DETECTED NOT DETECTED Final   Respiratory Syncytial Virus NOT DETECTED NOT DETECTED Final   Bordetella pertussis NOT DETECTED NOT DETECTED Final   Chlamydophila pneumoniae NOT DETECTED NOT DETECTED Final   Mycoplasma pneumoniae NOT DETECTED NOT DETECTED Final  Gram stain     Status: None   Collection Time: 05/18/17 10:50 AM  Result Value Ref Range Status   Specimen Description PLEURAL  Final   Special Requests NONE  Final   Gram Stain   Final    NO ORGANISMS SEEN WBC PRESENT,BOTH PMN AND MONONUCLEAR CYTOSPIN SMEAR    Report Status 05/18/2017 FINAL  Final    Studies/Results: Dg Chest 1 View  Result Date: 05/18/2017 CLINICAL DATA:  RIGHT pleural effusion post thoracentesis EXAM: CHEST 1 VIEW COMPARISON:  Chest radiograph 05/16/2017, CT chest 05/18/2017 FINDINGS: Enlargement of cardiac silhouette with pulmonary vascular congestion. Diffuse BILATERAL pulmonary infiltrates, slightly improved in upper lobes. Decreased RIGHT pleural effusion and basilar atelectasis  post thoracentesis. No pneumothorax. Bones demineralized. Retained contrast in colon from recent upper GI exam. IMPRESSION: No pneumothorax following RIGHT thoracentesis. Persistent BILATERAL pulmonary infiltrates, improved in upper lobes since 05/16/2017. Electronically Signed   By: Ulyses Southward M.D.   On: 05/18/2017 12:01   Ct Chest Wo Contrast  Result Date: 05/18/2017 CLINICAL DATA:  77 year old female with hypoxia and respiratory failure of unknown etiology. COPD. Patient presented initially in atrial fibrillation with rapid ventricular response. Underwent cardioversion on 05/12/2017. Recent low-grade fever. CHF versus pneumonia. On empiric antibiotics. EXAM: CT CHEST WITHOUT CONTRAST TECHNIQUE: Multidetector CT imaging of the chest was performed following the standard protocol without IV contrast. COMPARISON:  Chest radiographs 05/16/2017 and earlier. CTA Abdomen and Pelvis 03/14/2014. FINDINGS: Cardiovascular: Chronic mild cardiomegaly. Areas of chronic pericardial thickening and confluent pericardial calcification with no pericardial effusion. Calcified coronary artery atherosclerosis and/or stents. Vascular patency is not evaluated in the absence of IV contrast. Mediastinum/Nodes: Mildly fluid distended thoracic esophagus. Small to moderate size gastric hiatal hernia appears progressed since 2,015. Small bilateral mediastinal lymph nodes are within normal limits. No mediastinal lymphadenopathy. Lungs/Pleura: Moderate to large layering right pleural effusion. Trace left pleural fluid. Bilateral scattered confluent pulmonary opacity is mostly ground-glass type and peribronchial, although areas of peripheral/subpleural upper lobe involvement are noted. No definite air bronchograms or consolidation. Atelectatic appearance of the trachea and central pulmonary airways. Small volume of retained secretions suspected in the trachea on series 4, image 33. Upper Abdomen: Retained barium type contrast in the large  bowel flexures which results in streak artifact through the upper abdomen. Chronic calcified granulomas in the liver and spleen. Although there is a paucity of thoracic aortic calcified atherosclerosis there is moderate to extensive calcified plaque in the upper abdominal aorta and major mesenteric branches. Possible SMA stent. No upper abdominal free fluid. 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 Osteopenia. Mild pectus excavatum. No acute osseous abnormality identified. IMPRESSION: 1. Moderate to large layering right pleural effusion. 2. Diffuse bilateral confluent but nonspecific pulmonary opacity, mostly ground-glass type. The appearance more resembles widespread pulmonary infection or inflammation rather than asymmetric pulmonary edema. Consider viral/atypical pneumonia as well as noninfectious inflammatory processes - such as pulmonary vasculitis. 3. Chronic  calcified pericarditis. No pericardial effusion. Mild cardiomegaly. 4. Mild fluid distension of the thoracic esophagus with moderate hiatal hernia which has progressed since 2015. 5. Calcified coronary artery atherosclerosis and/or stents. Abdominal Aortic Atherosclerosis (ICD10-I70.0). Electronically Signed   By: Odessa Fleming M.D.   On: 05/18/2017 09:38   US Thoracentesis Asp Pleural Space W/img Guide  Result Date: 05/18/2017 INDICATION: RIGHT pleural effusion EXAM: ULTRASOUND GUIDED DIAGNOSTIC AND THERAPEUTIC RIGHT THORACENTESIS MEDICATIONS: None. COMPLICATIONS: None immediate. PROCEDURE: Procedure, benefits, and risks of procedure were discussed with patient. Written informed consent for procedure was obtained. Time out protocol followed. Pleural effusion localized by ultrasound at the posterior RIGHT hemithorax. Skin prepped and draped in usual sterile fashion. Skin and soft tissues anesthetized with 10 mL of 1% lidocaine. 8  French thoracentesis catheter placed into the RIGHT pleural space. 530 mL of clear yellow fluid aspirated by syringe pump. Procedure tolerated well by patient without immediate complication. FINDINGS: A total of approximately 530 mL of RIGHT pleural fluid was removed. Samples were sent to the laboratory as requested by the clinical team. IMPRESSION: Successful ultrasound guided RIGHT thoracentesis yielding 530 mL of pleural fluid. Electronically Signed   By: Ulyses Southward M.D.   On: 05/18/2017 11:42    Medications:  Prior to Admission:  Medications Prior to Admission  Medication Sig Dispense Refill Last Dose  . acetaminophen (TYLENOL) 325 MG tablet Take 2 tablets (650 mg total) by mouth every 6 (six) hours as needed.   unknown  . ALPRAZolam (XANAX) 0.5 MG tablet Take 1 tablet (0.5 mg total) by mouth 2 (two) times daily as needed. 160 tablet 0 05/05/2017 at Unknown time  . amLODipine (NORVASC) 5 MG tablet Take 1 tablet (5 mg total) daily by mouth. For blood pressure 90 tablet 1 05/04/2017 at Unknown time  . aspirin 81 MG tablet Take 81 mg by mouth daily.   unknown  . Calcium Carb-Cholecalciferol (CALCIUM 500 + D3) 500-600 MG-UNIT TABS Take 1 tablet by mouth daily. 60 tablet  05/04/2017 at Unknown time  . DULoxetine (CYMBALTA) 30 MG capsule Take 1 capsule (30 mg total) by mouth daily. 90 capsule 1 05/04/2017 at Unknown time  . furosemide (LASIX) 40 MG tablet 20 mg. Take one tablet by mouth as needed   05/05/2017 at Unknown time  . Insulin Glargine (LANTUS SOLOSTAR) 100 UNIT/ML Solostar Pen Inject 15 Units into the skin daily with breakfast. (Patient taking differently: Inject 14 Units into the skin daily with breakfast. ) 15 mL 2 05/05/2017 at Unknown time  . KLOR-CON M20 20 MEQ tablet TAKE 1 TABLET EVERY DAY 90 tablet 1 05/04/2017 at Unknown time  . levothyroxine (SYNTHROID, LEVOTHROID) 50 MCG tablet TAKE 1 TABLET EVERY DAY BEFORE BREAKFAST 90 tablet 1 05/05/2017 at Unknown time  . lisinopril  (PRINIVIL,ZESTRIL) 40 MG tablet Take 1 tablet (40 mg total) by mouth daily. 30 tablet 6 05/05/2017 at Unknown time  . metoprolol tartrate (LOPRESSOR) 25 MG tablet TAKE 1 TABLET TWICE DAILY 180 tablet 1 05/05/2017 at 0900  . NITROSTAT 0.4 MG SL tablet DISSOLVE  1 TABLET  UNDER THE TONGUE EVERY 5 MINUTES AS NEEDED FOR CHEST PAIN 25 tablet 0 unknown  . NOVOLOG FLEXPEN 100 UNIT/ML FlexPen INJECT 3 TO 5 UNITS SUBCUTANEOUSLY THREE TIMES DAILY WITH MEALS 15 mL 3 05/05/2017 at Unknown time  . PROLIA 60 MG/ML SOLN injection Inject 60 mg into the skin every 6 (six) months. To be administered 08/20/2016 1 each 0 unkmown  . rosuvastatin (CRESTOR) 5 MG tablet Take  1 tablet (5 mg total) daily by mouth. 90 tablet 1 05/05/2017 at Unknown time   Scheduled: . amiodarone  200 mg Oral BID  . amLODipine  2.5 mg Oral Daily  . dextrose  25 mL Intravenous Once  . DULoxetine  30 mg Oral Daily  . feeding supplement (GLUCERNA SHAKE)  237 mL Oral TID BM  . furosemide  40 mg Intravenous BID  . insulin aspart  0-9 Units Subcutaneous TID WC  . insulin aspart  2 Units Subcutaneous TID WC  . insulin glargine  6 Units Subcutaneous Daily  . levothyroxine  50 mcg Oral QAC breakfast  . linaclotide  290 mcg Oral QAC breakfast  . metoCLOPramide (REGLAN) injection  5 mg Intravenous TID AC  . metoprolol tartrate  50 mg Oral BID  . pantoprazole  40 mg Oral BID AC  . rosuvastatin  5 mg Oral Daily  . sodium chloride flush  3 mL Intravenous Q12H   Continuous: . sodium chloride    . piperacillin-tazobactam (ZOSYN)  IV 3.375 g (05/19/17 0981)   [id number], ALPRAZolam, alum & mag hydroxide-simeth, HYDROcodone-acetaminophen, promethazine, sodium chloride, sodium chloride flush  Assesment: She was admitted with atrial fib with RVR.  She has had TEE cardioversion.  She had heart failure acute on chronic.  She had a right pleural effusion and that was tapped yesterday.  She is having trouble with nausea and vomiting and has abnormal  chest CT and that could be from aspiration.  She also could have inflammatory lung disease or it could be still from volume overload.  She has chronic kidney disease which seems fairly stable.  She has diabetes which does not seem to be very well controlled.  She complains of dysphagia and had abnormal upper GI. Principal Problem:   Atrial fibrillation with RVR (HCC) Active Problems:   Mixed hyperlipidemia   Essential hypertension   Coronary artery disease involving native coronary artery of native heart with angina pectoris (HCC)   Chronic diastolic heart failure (HCC)   Diabetes type 2, uncontrolled (HCC)   Hypothyroidism   Uncontrolled type 2 diabetes mellitus with hyperglycemia (HCC)   Atrial fibrillation, new onset (HCC)   Congestive heart failure (HCC)   Intractable cyclical vomiting with nausea   Dysphagia   Esophageal pain   Hyperglycemia   Hypoglycemia   N&V (nausea and vomiting)    Plan: Check sedimentation rate and CRP.  These may be elevated because of her other medical problems but if these are exceptionally high over 70 or so then she may need to be treated for inflammatory lung disease.  She may need speech consultation.  Continue Zosyn for presumed aspiration  Thanks for allowing me to see her with you    LOS: 14 days   Jaylene Schrom L 05/19/2017, 9:11 AM

## 2017-05-20 ENCOUNTER — Encounter: Payer: Self-pay | Admitting: Internal Medicine

## 2017-05-20 ENCOUNTER — Inpatient Hospital Stay (HOSPITAL_COMMUNITY): Payer: Medicare HMO

## 2017-05-20 LAB — CBC
HCT: 33.2 % — ABNORMAL LOW (ref 36.0–46.0)
HEMOGLOBIN: 10.4 g/dL — AB (ref 12.0–15.0)
MCH: 31 pg (ref 26.0–34.0)
MCHC: 31.3 g/dL (ref 30.0–36.0)
MCV: 99.1 fL (ref 78.0–100.0)
Platelets: 294 10*3/uL (ref 150–400)
RBC: 3.35 MIL/uL — ABNORMAL LOW (ref 3.87–5.11)
RDW: 13.8 % (ref 11.5–15.5)
WBC: 10.1 10*3/uL (ref 4.0–10.5)

## 2017-05-20 LAB — GLUCOSE, CAPILLARY
GLUCOSE-CAPILLARY: 161 mg/dL — AB (ref 65–99)
Glucose-Capillary: 263 mg/dL — ABNORMAL HIGH (ref 65–99)
Glucose-Capillary: 215 mg/dL — ABNORMAL HIGH (ref 65–99)
Glucose-Capillary: 233 mg/dL — ABNORMAL HIGH (ref 65–99)

## 2017-05-20 LAB — BASIC METABOLIC PANEL
ANION GAP: 15 (ref 5–15)
BUN: 45 mg/dL — ABNORMAL HIGH (ref 6–20)
CALCIUM: 7.1 mg/dL — AB (ref 8.9–10.3)
CHLORIDE: 95 mmol/L — AB (ref 101–111)
CO2: 29 mmol/L (ref 22–32)
Creatinine, Ser: 1.99 mg/dL — ABNORMAL HIGH (ref 0.44–1.00)
GFR calc Af Amer: 27 mL/min — ABNORMAL LOW (ref >60)
GFR calc non Af Amer: 23 mL/min — ABNORMAL LOW (ref >60)
Glucose, Bld: 255 mg/dL — ABNORMAL HIGH (ref 65–99)
Potassium: 3 mmol/L — ABNORMAL LOW (ref 3.5–5.1)
Sodium: 139 mmol/L (ref 135–145)

## 2017-05-20 LAB — PH, BODY FLUID: pH, Body Fluid: 7.5

## 2017-05-20 MED ORDER — RIVAROXABAN 15 MG PO TABS
15.0000 mg | ORAL_TABLET | Freq: Every day | ORAL | Status: DC
  Administered 2017-05-20 – 2017-05-21 (×2): 15 mg via ORAL
  Filled 2017-05-20 (×2): qty 1

## 2017-05-20 MED ORDER — POTASSIUM CHLORIDE CRYS ER 20 MEQ PO TBCR
40.0000 meq | EXTENDED_RELEASE_TABLET | Freq: Once | ORAL | Status: AC
  Administered 2017-05-20: 40 meq via ORAL
  Filled 2017-05-20: qty 2

## 2017-05-20 MED ORDER — METHYLPREDNISOLONE SODIUM SUCC 40 MG IJ SOLR
40.0000 mg | Freq: Two times a day (BID) | INTRAMUSCULAR | Status: DC
  Administered 2017-05-20 – 2017-05-21 (×4): 40 mg via INTRAVENOUS
  Filled 2017-05-20 (×5): qty 1

## 2017-05-20 NOTE — Progress Notes (Addendum)
PROGRESS NOTE    Ellen Woods   WUJ:[id number]  DOB: April 12, 1941  DOA: 05/05/2017 PCP: Dettinger, Elige Radon, MD   Brief Narrative:  Ellen Woods is a 77 y.o. female with medical history significant of COPD, coronary artery disease, diabetes mellitus type 2, dyspnea on exertion who presented to the emergency department with palpitations and facial swelling. In the ER she was found to have A-fib with RVR and pulmonary edema. She has had a prolonged hospital stay. She underwent cardioversion on 1/8 and has been in NSR since.  She developed acute respiratory failure which we are still trying to resolve- see below.  She had complaints of dysphagia, regurgitation and vomiting which were evaluated with an EGD and an esophagram.  She continues to complain of vomiting and regurgitation. Please note that the patient is a poor historian and is difficult to get a full history out of her without asking multiple questions repeatedly.   Subjective: Pt says that she feels nauseated this morning.  She did eat some of her breakfast, She did not throw it up.  She is being given nausea medication by RN.   Assessment & Plan:   Atrial fibrillation with RVR - underwent TEE cardioversion on 1/8 - CHA2DS2-VASc Score 5 - being managed by cardiology with Amiodarone and Lopressor - has been on Coumadin but was put on hold for EGD -  on NOAC now - HR has been controlled and stable.  Cardioversion was successful.   Nausea/ vomiting- esophageal stricture - occasional dysphagia and regurgitation of food per daughter  - upper GI series shows severe impairment of esophageal motility, a narrowing at the GE junction, a hiatal hernia and possible GOO vs gastroparesis - she needed esophageal dilation about 15 yrs ago  - 1/11- EGD did not reveal any stricture- GI junction was "empirically dilated"  In case she has achalasia - her regurgitation symptoms are likely due to severe esophageal dysmotility  and a hiatal hernia- she continues to have episodes of regurgitation and vomiting   GI is recommending outpatient manometry study.    Dyspnea/ acute resp distress- due to acute d CHF and or b/l lobar pneumonia - 1/9-  RR is in 20s on my exam and there are crackles at bases -  - per patient, she has had a cough for about 1 wk - CXR >>- is not clear on whether this is pneumonia or CHF  - started on lasix and Levaquin  - 1/10- mild improvement noted- she states cough is gone but she is still hypoxic- cont to diurese and treat with Levaquin and attempt to wean O2 1/11- symptoms appear to be improving- she is not dyspneic today- wean O2- cont diuretics and antibiotics 1/12- repeat CXR shows bilateral multifocal pneumonia which is quite extensive in the right lung base- ? If she aspirated in relation to above esophageal dysmotility- will change Levaquin to Zosyn today- she still has extensive crackles on exam and is still requiring O2- lasix held  - 1/13- cont current management with anibiotics- wean O2 as able - 1/14- continues to be hypoxic- WBC improving- have obtained a CT of the chest which reveals a mod-large right sided pleural effusion which was not seen on the CXR and multiple infiltrates- difficult to tell if this is CHF or an atypical infection or autoimmune - s/p thoracentesis ...530 cc of yellow fluid obtained- pleural fluid results reveal a transudate and lasix has been resumed - 1/14- given a dose of  Levaquin in case we are missing an atypical organism with Zosyn -- cont to follow- have asked for pulmonary consult  - continue antibiotics  Severe constipation  - on Linzess - not much stool output after suppositories- she has not had much to eat in the hospital and may not have much stool to pass- follow    Chronic diastolic heart failure  - see above -pleural fluid looks like transudate - Lasix resumed- follow I and O and daily weights  CKD 3-4   - stable, following.    Hypokalemia - ordered replacement, follow.  Check magnesium.    Brittle, insulin dependent Diabetes type 2 - continue to monitor closely as her diet intake is fluctuating, high risk for hypoglycemia CBG (last 3)  Recent Labs    05/19/17 1648 05/19/17 2202 05/20/17 0805  GLUCAP 161* 71 263*     Hypothyroidism - continue home dose Synthroid    DVT prophylaxis: SCDs Code Status: Full code Family Communication: daughter Disposition Plan:  Consultants:   Cardiology  GI Procedures:  2 D ECHO Left ventricle: The cavity size was normal. Wall thickness was  normal. Systolic function was normal. The estimated ejection   fraction was in the range of 60% to 65%. Wall motion was normal;  there were no regional wall motion abnormalities. - Aortic valve: Mildly calcified annulus. Trileaflet; normal thickness leaflets. Valve area (VTI): 1.87 cm^2. - Mitral valve: There was mild regurgitation. - Left atrium: The atrium was moderately dilated. - Right atrium: The atrium was mildly dilated. - Atrial septum: No defect or patent foramen ovale was identified. - Pulmonary arteries: Systolic pressure was mildly to moderately  increased. PA peak pressure: 40 mm Hg (S).  TEE cardioversion  Antimicrobials:  Anti-infectives (From admission, onward)   Start     Dose/Rate Route Frequency Ordered Stop   05/19/17 1300  doxycycline (VIBRAMYCIN) 100 mg in dextrose 5 % 250 mL IVPB     100 mg 125 mL/hr over 120 Minutes Intravenous Every 12 hours 05/19/17 1254     05/18/17 0815  levofloxacin (LEVAQUIN) IVPB 500 mg  Status:  Discontinued     500 mg 100 mL/hr over 60 Minutes Intravenous Every 48 hours 05/18/17 0748 05/18/17 1048   05/16/17 1030  piperacillin-tazobactam (ZOSYN) IVPB 3.375 g     3.375 g 12.5 mL/hr over 240 Minutes Intravenous Every 8 hours 05/16/17 1008     05/15/17 1800  levofloxacin (LEVAQUIN) IVPB 500 mg  Status:  Discontinued     500 mg 100 mL/hr over 60 Minutes Intravenous Every  48 hours 05/15/17 1204 05/16/17 0941   05/13/17 1800  levofloxacin (LEVAQUIN) IVPB 750 mg  Status:  Discontinued     750 mg 100 mL/hr over 90 Minutes Intravenous Every 48 hours 05/13/17 1610 05/15/17 1204     Objective: Vitals:   05/19/17 1442 05/19/17 2213 05/20/17 0521 05/20/17 1103  BP: (!) 111/42 (!) 110/49 (!) 115/48 (!) 121/46  Pulse: 65 63 (!) 59 64  Resp: 18 20 16    Temp: 97.6 F (36.4 C) 98.5 F (36.9 C) 97.9 F (36.6 C)   TempSrc: Oral Oral Oral   SpO2: 95% 94% 96% 97%  Weight:   59.3 kg (130 lb 11.7 oz)   Height:        Intake/Output Summary (Last 24 hours) at 05/20/2017 1151 Last data filed at 05/20/2017 1128 Gross per 24 hour  Intake 815 ml  Output 1800 ml  Net -985 ml   [employer]  05/18/17 0300 05/19/17 0511 05/20/17 0521  Weight: 57.5 kg (126 lb 12.2 oz) 59.8 kg (131 lb 13.4 oz) 59.3 kg (130 lb 11.7 oz)    Examination: General exam: Appears comfortable, sitting up in chair.  NAD.  HEENT: PERRLA, oral mucosa moist, no sclera icterus or thrush Respiratory system: BBS fairly clear.  Cardiovascular system: S1 & S2 heard.  No murmurs, rubs, gallops.  Gastrointestinal system: Abdomen soft, non-tender, nondistended. Normal bowel sound. No organomegaly Central nervous system: Alert and oriented. No focal neurological deficits. Extremities: No cyanosis, clubbing or edema Skin: No rashes or ulcers Psychiatry:  Mood & affect appropriate.   Data Reviewed: I have personally reviewed following labs and imaging studies  CBC: Recent Labs  Lab 05/17/17 0551 05/18/17 0446 05/19/17 0443 05/20/17 0622  WBC 10.7* 12.9* 11.5* 10.1  HGB 10.7* 10.7* 9.9* 10.4*  HCT 34.8* 35.0* 32.4* 33.2*  MCV 100.6* 100.0 99.1 99.1  PLT 310 333 312 294   Basic Metabolic Panel: Recent Labs  Lab 05/16/17 0555 05/17/17 0551 05/17/17 1230 05/18/17 0446 05/19/17 0443 05/20/17 0622  NA 144 140  --  144 141 139  K 3.0* 3.1*  --  4.2 3.3* 3.0*  CL 99* 96*  --  100* 99* 95*   CO2 28 28  --  32 30 29  GLUCOSE 94 217*  --  184* 159* 255*  BUN 49* 47*  --  51* 48* 45*  CREATININE 1.81* 1.83*  --  1.83* 1.79* 1.99*  CALCIUM 7.7* 7.4*  --  7.7* 7.2* 7.1*  MG  --   --  2.2 2.0  --   --    GFR: Estimated Creatinine Clearance: 18.8 mL/min (A) (by C-G formula based on SCr of 1.99 mg/dL (H)). Liver Function Tests: No results for input(s): AST, ALT, ALKPHOS, BILITOT, PROT, ALBUMIN in the last 168 hours. No results for input(s): LIPASE, AMYLASE in the last 168 hours. No results for input(s): AMMONIA in the last 168 hours. Coagulation Profile: Recent Labs  Lab 05/14/17 0409 05/15/17 0525 05/16/17 0555 05/17/17 0511  INR 1.93 1.96 2.68 3.27   Cardiac Enzymes: No results for input(s): CKTOTAL, CKMB, CKMBINDEX, TROPONINI in the last 168 hours. BNP (last 3 results) No results for input(s): PROBNP in the last 8760 hours. HbA1C: No results for input(s): HGBA1C in the last 72 hours. CBG: Recent Labs  Lab 05/19/17 0751 05/19/17 1203 05/19/17 1648 05/19/17 2202 05/20/17 0805  GLUCAP 146* 170* 161* 71 263*   Lipid Profile: No results for input(s): CHOL, HDL, LDLCALC, TRIG, CHOLHDL, LDLDIRECT in the last 72 hours. Thyroid Function Tests: No results for input(s): TSH, T4TOTAL, FREET4, T3FREE, THYROIDAB in the last 72 hours. Anemia Panel: No results for input(s): VITAMINB12, FOLATE, FERRITIN, TIBC, IRON, RETICCTPCT in the last 72 hours. Urine analysis:    Component Value Date/Time   COLORURINE YELLOW 03/14/2014 1227   APPEARANCEUR CLEAR 03/14/2014 1227   LABSPEC 1.015 03/14/2014 1227   PHURINE 5.0 03/14/2014 1227   GLUCOSEU 100 (A) 03/14/2014 1227   HGBUR NEGATIVE 03/14/2014 1227   BILIRUBINUR neg 06/13/2015 1710   KETONESUR NEGATIVE 03/14/2014 1227   PROTEINUR neg 06/13/2015 1710   PROTEINUR NEGATIVE 03/14/2014 1227   UROBILINOGEN negative 06/13/2015 1710   UROBILINOGEN 0.2 03/14/2014 1227   NITRITE neg 06/13/2015 1710   NITRITE NEGATIVE 03/14/2014  1227   LEUKOCYTESUR Negative 06/13/2015 1710   Recent Results (from the past 240 hour(s))  Respiratory Panel by PCR     Status: None   Collection  Time: 05/18/17 10:12 AM  Result Value Ref Range Status   Adenovirus NOT DETECTED NOT DETECTED Final   Coronavirus 229E NOT DETECTED NOT DETECTED Final   Coronavirus HKU1 NOT DETECTED NOT DETECTED Final   Coronavirus NL63 NOT DETECTED NOT DETECTED Final   Coronavirus OC43 NOT DETECTED NOT DETECTED Final   Metapneumovirus NOT DETECTED NOT DETECTED Final   Rhinovirus / Enterovirus NOT DETECTED NOT DETECTED Final   Influenza A NOT DETECTED NOT DETECTED Final   Influenza B NOT DETECTED NOT DETECTED Final   Parainfluenza Virus 1 NOT DETECTED NOT DETECTED Final   Parainfluenza Virus 2 NOT DETECTED NOT DETECTED Final   Parainfluenza Virus 3 NOT DETECTED NOT DETECTED Final   Parainfluenza Virus 4 NOT DETECTED NOT DETECTED Final   Respiratory Syncytial Virus NOT DETECTED NOT DETECTED Final   Bordetella pertussis NOT DETECTED NOT DETECTED Final   Chlamydophila pneumoniae NOT DETECTED NOT DETECTED Final   Mycoplasma pneumoniae NOT DETECTED NOT DETECTED Final  Gram stain     Status: None   Collection Time: 05/18/17 10:50 AM  Result Value Ref Range Status   Specimen Description PLEURAL  Final   Special Requests NONE  Final   Gram Stain   Final    NO ORGANISMS SEEN WBC PRESENT,BOTH PMN AND MONONUCLEAR CYTOSPIN SMEAR    Report Status 05/18/2017 FINAL  Final    Radiology Studies: No results found.  Scheduled Meds: . amiodarone  200 mg Oral BID  . amLODipine  2.5 mg Oral Daily  . dextrose  25 mL Intravenous Once  . DULoxetine  30 mg Oral Daily  . feeding supplement (GLUCERNA SHAKE)  237 mL Oral TID BM  . furosemide  40 mg Intravenous BID  . insulin aspart  0-9 Units Subcutaneous TID WC  . insulin aspart  2 Units Subcutaneous TID WC  . insulin glargine  6 Units Subcutaneous Daily  . levothyroxine  50 mcg Oral QAC breakfast  . linaclotide  290  mcg Oral QAC breakfast  . methylPREDNISolone (SOLU-MEDROL) injection  40 mg Intravenous Q12H  . metoCLOPramide (REGLAN) injection  5 mg Intravenous TID AC  . metoprolol tartrate  50 mg Oral BID  . pantoprazole  40 mg Oral BID AC  . rosuvastatin  5 mg Oral Daily  . sodium chloride flush  3 mL Intravenous Q12H   Continuous Infusions: . sodium chloride    . doxycycline (VIBRAMYCIN) IV Stopped (05/20/17 0549)  . piperacillin-tazobactam (ZOSYN)  IV Stopped (05/20/17 0949)    LOS: 15 days   Standley Dakins, MD [facility] Pager: [phone] [email or web address] Password [facility] 05/20/2017, 11:51 AM

## 2017-05-20 NOTE — Progress Notes (Signed)
Physical Therapy Treatment Patient Details Name: Ellen HaverGlenda J Woods MRN: [id number] DOB: 01/20/1941 Today's Date: 05/20/2017    History of Present Illness Ellen Woods is a 77 y.o. female with medical history significant of COPD, coronary artery disease, diabetes mellitus type 2, dyspnea on exertion who presented to the emergency department with palpitations and facial swelling.  Patient's daughter and son are bedside and states that this morning patient began to have swelling on one side of her face and it appeared as though her heart was "beating out of her chest".  They used an at home blood pressure monitor and stated her blood pressure was reasonably controlled but her heart rate was in the 150s.  They gave patient a Xanax and stated that her heart rate came down and the swelling improved.  She presented to the emergency department for further evaluation of her tachycardia.  Patient reports that she has chronic dyspnea on exertion and has told this to her cardiologist who she saw in October 2018 but states that no further workup was ordered for her.  She does mention that she has a history of coronary artery disease with upon reviewing the note from the cardiologist in October she had a negative Lexiscan Myoview.  At that time her ejection fraction was noted to be 78%.  Patient reports no recent sick contacts except for her son and daughter who had gastroenteritis.  She voices that she occasionally has vomiting but this she attributes to her esophagus requiring stretching which she [place]  She denies fevers or chills, denies diarrhea or constipation, and denies chest pain.  She states that she thinks her palpitations began last night before she went to bed but she did not measure her heart rate at that time.  According to her problem list patient has a history of paroxysmal atrial fibrillation from 2013 however she cannot recall this incident.  There is a  cardiology note from June 2017 that voices that she had a Holter monitor that demonstrated premature atrial contractions and atrial tachycardia.  She does have a history of a PDA which is small and has been managed medically.    PT Comments    Pt sitting in chair upon therapist entrance, friendly and willing to participate.  Gait training complete with RW, pt steady with gait with no LOB episodes through session.  Pt able to keep O2 sat between 92-94% during gait and at 95% upon rest with 2L O2 assistance.  Pt left in chair with call bell within reach and RN aware of status.     Follow Up Recommendations  Home health PT;Supervision/Assistance - 24 hour     Equipment Recommendations  None recommended by PT    Recommendations for Other Services       Precautions / Restrictions Precautions Precautions: Fall Restrictions Weight Bearing Restrictions: No    Mobility  Bed Mobility               General bed mobility comments: pt in chair upon PTA entrance  Transfers Overall transfer level: Modified independent Equipment used: Rolling walker (2 wheeled) Transfers: Sit to/from Stand Sit to Stand: Supervision Stand pivot transfers: Supervision          Ambulation/Gait Ambulation/Gait assistance: Min guard Ambulation Distance (Feet): 65 Feet Assistive device: Rolling walker (2 wheeled) Gait Pattern/deviations: Decreased step length - right;Decreased step length - left;Decreased stride length   Gait velocity interpretation: Below normal speed for age/gender  General Gait Details: Used RW and 2 LPM O2 with O2 sats remaining 92-94% during gait   Stairs            Wheelchair Mobility    Modified Rankin (Stroke Patients Only)       Balance                                            Cognition Arousal/Alertness: Awake/alert Behavior During Therapy: WFL for tasks assessed/performed Overall Cognitive Status: Within Functional Limits for tasks  assessed                                        Exercises      General Comments        Pertinent Vitals/Pain Pain Assessment: No/denies pain    Home Living                      Prior Function            PT Goals (current goals can now be found in the care plan section)      Frequency    Min 3X/week      PT Plan Current plan remains appropriate    Co-evaluation              AM-PAC PT "6 Clicks" Daily Activity  Outcome Measure  Difficulty turning over in bed (including adjusting bedclothes, sheets and blankets)?: None Difficulty moving from lying on back to sitting on the side of the bed? : None Difficulty sitting down on and standing up from a chair with arms (e.g., wheelchair, bedside commode, etc,.)?: A Little Help needed moving to and from a bed to chair (including a wheelchair)?: A Little Help needed walking in hospital room?: A Little Help needed climbing 3-5 steps with a railing? : A Little 6 Click Score: 20    End of Session Equipment Utilized During Treatment: Oxygen Activity Tolerance: Patient tolerated treatment well;Patient limited by fatigue Patient left: in chair;with call bell/phone within reach(RW aware of status in chair) Nurse Communication: Mobility status PT Visit Diagnosis: Unsteadiness on feet (R26.81);Other abnormalities of gait and mobility (R26.89);Muscle weakness (generalized) (M62.81)     Time: 4098-1191 PT Time Calculation (min) (ACUTE ONLY): 27 min  Charges:  $Therapeutic Activity: 23-37 mins                    G Codes:       Becky Sax, LPTA; CBIS [phone]  Juel Burrow 05/20/2017, 3:49 PM

## 2017-05-20 NOTE — Progress Notes (Signed)
Pharmacy Antibiotic Note  Ellen HaverGlenda J Woods is a 77 y.o. female admitted on 05/05/2017 with aspiration pneumonia/CAP.  Pharmacy has been consulted for Zosyn dosing. Doxycycline also ordered for patient  Plan: Doxycycline 100 mg IV Q12 hours Zosyn 3.375 G IV Q8 hours Monitor labs, vitals   Height: 4\' 11"  (149.9 cm) Weight: 130 lb 11.7 oz (59.3 kg) IBW/kg (Calculated) : 43.2  Temp (24hrs), Avg:98 F (36.7 C), Min:97.6 F (36.4 C), Max:98.5 F (36.9 C)  Recent Labs  Lab 05/16/17 0555 05/17/17 0551 05/18/17 0446 05/19/17 0443 05/20/17 0622  WBC  --  10.7* 12.9* 11.5* 10.1  CREATININE 1.81* 1.83* 1.83* 1.79* 1.99*    Estimated Creatinine Clearance: 18.8 mL/min (A) (by C-G formula based on SCr of 1.99 mg/dL (H)).    Allergies  Allergen Reactions  . Codeine     REACTION: nausea  . Invokana [Canagliflozin]   . Naproxen Other (See Comments)    Abdominal pain  . Azithromycin Other (See Comments)    Hospital reaction    Antimicrobials this admission: Doxycycline 1/15 >>  Zosyn 1/12 >>  Levaquin 1/9>>1/4  Dose adjustments this admission: Monitor renal function for Zosyn adjustments  Microbiology results: Respiratory panel by PCR: not detected Pleural fluid: none  MRSA PCR: negative  Thank you for allowing pharmacy to be a part of this patient's care.   Judeth CornfieldSteven Atilla Zollner, PharmD Clinical Pharmacist 05/20/2017 2:39 PM

## 2017-05-20 NOTE — Evaluation (Signed)
Clinical/Bedside Swallow Evaluation Patient Details  Name: Ellen Woods MRN: [id number] Date of Birth: 01/17/1941  Today's Date: 05/20/2017 Time: SLP Start Time (ACUTE ONLY): 0910 SLP Stop Time (ACUTE ONLY): 0938 SLP Time Calculation (min) (ACUTE ONLY): 28 min  Past Medical History:  Past Medical History:  Diagnosis Date  . Arthritis   . Back pain 1/14   Tx by orthopedics  . COPD (chronic obstructive pulmonary disease) (HCC)   . Coronary atherosclerosis of native coronary artery    50% LAD stenosis  . Encounter for long-term (current) use of insulin (HCC)   . Family history of anesthesia complication    Daughter has nausea  . PDA (patent ductus arteriosus)    Small PDA documented by CT angiography, no pulmonary hypertension  . Pericardial calcification    Ruled out for restrictive pericarditis  . Thyroid disease   . Type II or unspecified type diabetes mellitus without mention of complication, not stated as uncontrolled   . Unspecified essential hypertension    Past Surgical History:  Past Surgical History:  Procedure Laterality Date  . ABDOMINAL HYSTERECTOMY     partial  . BIOPSY  05/15/2017   Procedure: BIOPSY;  Surgeon: Corbin Adeourk, Robert M, MD;  Location: [facility];  Service: Endoscopy;;  gastric   . CARDIOVERSION N/A 05/11/2017   Procedure: CARDIOVERSION;  Surgeon: Jonelle SidleMcDowell, Samuel G, MD;  Location: [facility];  Service: Cardiovascular;  Laterality: N/A;  . ESOPHAGEAL DILATION N/A 05/15/2017   Procedure: ESOPHAGEAL DILATION;  Surgeon: Corbin Adeourk, Robert M, MD;  Location: [facility];  Service: Endoscopy;  Laterality: N/A;  . ESOPHAGOGASTRODUODENOSCOPY N/A 05/15/2017   Procedure: ESOPHAGOGASTRODUODENOSCOPY (EGD);  Surgeon: Corbin Adeourk, Robert M, MD;  Location: [facility];  Service: Endoscopy;  Laterality: N/A;  . INCISIONAL HERNIA REPAIR    . INTRAMEDULLARY (IM) NAIL INTERTROCHANTERIC Left 11/13/2012   Procedure: INTRAMEDULLARY (IM) NAIL INTERTROCHANTRIC LEFT HIP;  Surgeon:  Velna OchsPeter G Dalldorf, MD;  Location: [facility];  Service: Orthopedics;  Laterality: Left;  . TEE WITHOUT CARDIOVERSION N/A 05/11/2017   Procedure: TRANSESOPHAGEAL ECHOCARDIOGRAM (TEE) WITH PROPOFOL;  Surgeon: Jonelle SidleMcDowell, Samuel G, MD;  Location: [facility];  Service: Cardiovascular;  Laterality: N/A;  . TUBAL LIGATION     HPI:  She was admitted on the first of this month with palpitations and swelling.  She was found to have atrial fib with rapid ventricular response and pulmonary edema.  She had TEE cardioversion on 1 8 and has remained in sinus rhythm.  Since that time she has had increasing problems with hypoxic respiratory failure and her CT of the chest shows groundglass opacities.  It is felt that she probably aspirated.  She is been complaining of dysphagia and underwent EGD with dilatation although there was no definite stricture.  Her barium swallow showed that she has diffuse esophageal motility disorder.  There is concerned that she may have some sort of inflammatory lung disease as well as the aspiration and her sedimentation rate was elevated at 102.   Assessment / Plan / Recommendation Clinical Impression  Pt assessed at bedside, however she had just completed breakfast meal so intake was limited. Epic notes reviewed and discussed recent assessments with patient. She reports h/o esophageal dilation over 15 years ago. Oral motor examination WFL, mild lingual coating. Pt consumed eggs and sausage prior to SLP arrival and reported that she tolerated that well, but that when she drank some coffee some came "back up". SLP explained importance of eating more frequent, smaller meals at  a slow rate given suspicion of achalasia/ esophageal dysmotility. Pt cued to cough by SLP and when she did, she regurgitated what appeared to be sausage. Do not feel that MBSS would add to clinical picture at this time, but do recommend D2/chopped and thin liquids via small sips with standard aspiration and reflux precautions.  Pt is at risk for post prandial aspiration given esophageal dysmotility and regurgitation. SLP will follow in acute setting.   SLP Visit Diagnosis: Dysphagia, unspecified (R13.10)    Aspiration Risk  Mild aspiration risk    Diet Recommendation Dysphagia 2 (Fine chop);Thin liquid   Liquid Administration via: Cup;Straw Medication Administration: Whole meds with liquid Supervision: Patient able to self feed;Intermittent supervision to cue for compensatory strategies Compensations: Slow rate;Small sips/bites Postural Changes: Seated upright at 90 degrees;Remain upright for at least 30 minutes after po intake    Other  Recommendations Oral Care Recommendations: Oral care BID;Staff/trained caregiver to provide oral care Other Recommendations: Clarify dietary restrictions   Follow up Recommendations None      Frequency and Duration min 2x/week  1 week       Prognosis Prognosis for Safe Diet Advancement: Fair Barriers to Reach Goals: Severity of deficits      Swallow Study   General Date of Onset: 05/05/17 HPI: She was admitted on the first of this month with palpitations and swelling.  She was found to have atrial fib with rapid ventricular response and pulmonary edema.  She had TEE cardioversion on 1 8 and has remained in sinus rhythm.  Since that time she has had increasing problems with hypoxic respiratory failure and her CT of the chest shows groundglass opacities.  It is felt that she probably aspirated.  She is been complaining of dysphagia and underwent EGD with dilatation although there was no definite stricture.  Her barium swallow showed that she has diffuse esophageal motility disorder.  There is concerned that she may have some sort of inflammatory lung disease as well as the aspiration and her sedimentation rate was elevated at 102. Type of Study: Bedside Swallow Evaluation Previous Swallow Assessment: Pt had UGI and EGD this admission Diet Prior to this Study: Dysphagia 2  (chopped);Thin liquids Temperature Spikes Noted: No Respiratory Status: Nasal cannula History of Recent Intubation: No Behavior/Cognition: Alert;Cooperative;Pleasant mood Oral Cavity Assessment: Within Functional Limits(lingual coating) Oral Care Completed by SLP: Yes Oral Cavity - Dentition: Dentures, top;Dentures, bottom Vision: Functional for self-feeding Self-Feeding Abilities: Able to feed self;Needs set up Patient Positioning: Upright in bed Baseline Vocal Quality: Low vocal intensity Volitional Cough: Weak Volitional Swallow: Able to elicit    Oral/Motor/Sensory Function Overall Oral Motor/Sensory Function: Within functional limits   Ice Chips Ice chips: Not tested   Thin Liquid Thin Liquid: Within functional limits Presentation: Cup;Self Fed;Straw    Nectar Thick Nectar Thick Liquid: Not tested   Honey Thick Honey Thick Liquid: Not tested   Puree Puree: Within functional limits Presentation: Spoon   Solid   Thank you,  Havery Moros, CCC-SLP [phone]    Solid: (not observed see comments)        Reggie Welge 05/20/2017,2:59 PM

## 2017-05-20 NOTE — Progress Notes (Addendum)
Subjective: She was admitted on the first of this month with palpitations and swelling.  She was found to have atrial fib with rapid ventricular response and pulmonary edema.  She had TEE cardioversion on 1 8 and has remained in sinus rhythm.  Since that time she has had increasing problems with hypoxic respiratory failure and her CT of the chest shows groundglass opacities.  It is felt that she probably aspirated.  She is been complaining of dysphagia and underwent EGD with dilatation although there was no definite stricture.  Her barium swallow showed that she has diffuse esophageal motility disorder.  There is concerned that she may have some sort of inflammatory lung disease as well as the aspiration and her sedimentation rate was elevated at 102.  Objective: Vital signs in last 24 hours: Temp:  [97.6 F (36.4 C)-98.5 F (36.9 C)] 97.9 F (36.6 C) (01/16 0521) Pulse Rate:  [59-65] 59 (01/16 0521) Resp:  [16-20] 16 (01/16 0521) BP: (110-115)/(42-49) 115/48 (01/16 0521) SpO2:  [94 %-96 %] 96 % (01/16 0521) Weight:  [59.3 kg (130 lb 11.7 oz)] 59.3 kg (130 lb 11.7 oz) (01/16 0521) Weight change: -0.5 kg (-1.6 oz) Last BM Date: 05/13/17  Intake/Output from previous day: 01/15 0701 - 01/16 0700 In: 765 [P.O.:120; IV Piggyback:600] Out: 1800 [Urine:1800]  PHYSICAL EXAM General appearance: alert, cooperative and no distress Resp: She has rales bilaterally scattered throughout her chest but predominantly lower lobes Cardio: To be in sinus rhythm GI: soft, non-tender; bowel sounds normal; no masses,  no organomegaly Extremities: extremities normal, atraumatic, no cyanosis or edema Skin warm and dry  Lab Results:  Results for orders placed or performed during the hospital encounter of 05/05/17 (from the past 48 hour(s))  Strep pneumoniae urinary antigen     Status: None   Collection Time: 05/18/17 10:10 AM  Result Value Ref Range   Strep Pneumo Urinary Antigen NEGATIVE NEGATIVE     Comment:        Infection due to S. pneumoniae cannot be absolutely ruled out since the antigen present may be below the detection limit of the test.   Respiratory Panel by PCR     Status: None   Collection Time: 05/18/17 10:12 AM  Result Value Ref Range   Adenovirus NOT DETECTED NOT DETECTED   Coronavirus 229E NOT DETECTED NOT DETECTED   Coronavirus HKU1 NOT DETECTED NOT DETECTED   Coronavirus NL63 NOT DETECTED NOT DETECTED   Coronavirus OC43 NOT DETECTED NOT DETECTED   Metapneumovirus NOT DETECTED NOT DETECTED   Rhinovirus / Enterovirus NOT DETECTED NOT DETECTED   Influenza A NOT DETECTED NOT DETECTED   Influenza B NOT DETECTED NOT DETECTED   Parainfluenza Virus 1 NOT DETECTED NOT DETECTED   Parainfluenza Virus 2 NOT DETECTED NOT DETECTED   Parainfluenza Virus 3 NOT DETECTED NOT DETECTED   Parainfluenza Virus 4 NOT DETECTED NOT DETECTED   Respiratory Syncytial Virus NOT DETECTED NOT DETECTED   Bordetella pertussis NOT DETECTED NOT DETECTED   Chlamydophila pneumoniae NOT DETECTED NOT DETECTED   Mycoplasma pneumoniae NOT DETECTED NOT DETECTED  ANA, IFA (with reflex)     Status: None   Collection Time: 05/18/17 10:24 AM  Result Value Ref Range   ANA Ab, IFA Negative     Comment: (NOTE)                                     Negative   <  1:80                                     Borderline  1:80                                     Positive   >1:80 Performed At: [facility] [place], [place] [id number] Rush Farmer MD [id number]   C-reactive protein     Status: Abnormal   Collection Time: 05/18/17 10:24 AM  Result Value Ref Range   CRP 23.9 (H) <1.0 mg/dL    Comment: Performed at [facility] [address], [place], [place] [address]  Lactate dehydrogenase (pleural or peritoneal fluid)     Status: Abnormal   Collection Time: 05/18/17 10:50 AM  Result Value Ref Range   LD, Fluid 109 (H) 3 - 23 U/L    Comment: (NOTE) Results should be  evaluated in conjunction with serum values    Fluid Type-FLDH CYTOPLEU   Body fluid cell count with differential     Status: Abnormal   Collection Time: 05/18/17 10:50 AM  Result Value Ref Range   Fluid Type-FCT CYTOPLEU    Color, Fluid YELLOW YELLOW   Appearance, Fluid HAZY (A) CLEAR   WBC, Fluid 219 0 - 1,000 cu mm   Neutrophil Count, Fluid 16 0 - 25 %   Lymphs, Fluid 34 %   Monocyte-Macrophage-Serous Fluid 50 50 - 90 %   Eos, Fluid 0 %   Other Cells, Fluid MESOTHELIAL CELLS %  Gram stain     Status: None   Collection Time: 05/18/17 10:50 AM  Result Value Ref Range   Specimen Description PLEURAL    Special Requests NONE    Gram Stain      NO ORGANISMS SEEN WBC PRESENT,BOTH PMN AND MONONUCLEAR CYTOSPIN SMEAR    Report Status 05/18/2017 FINAL   Protein, pleural or peritoneal fluid     Status: None   Collection Time: 05/18/17 10:50 AM  Result Value Ref Range   Total protein, fluid <3.0 g/dL    Comment: (NOTE) No normal range established for this test Results should be evaluated in conjunction with serum values    Fluid Type-FTP CYTOPLEU   Glucose, pleural or peritoneal fluid     Status: None   Collection Time: 05/18/17 10:50 AM  Result Value Ref Range   Glucose, Fluid 184 mg/dL    Comment: (NOTE) No normal range established for this test Results should be evaluated in conjunction with serum values    Fluid Type-FGLU CYTOPLEU   Glucose, capillary     Status: Abnormal   Collection Time: 05/18/17 11:48 AM  Result Value Ref Range   Glucose-Capillary 121 (H) 65 - 99 mg/dL  Glucose, capillary     Status: Abnormal   Collection Time: 05/18/17  4:29 PM  Result Value Ref Range   Glucose-Capillary 104 (H) 65 - 99 mg/dL  Glucose, capillary     Status: Abnormal   Collection Time: 05/18/17  9:16 PM  Result Value Ref Range   Glucose-Capillary 152 (H) 65 - 99 mg/dL  CBC     Status: Abnormal   Collection Time: 05/19/17  4:43 AM  Result Value Ref Range   WBC 11.5 (H) 4.0 -  10.5 K/uL   RBC 3.27 (L) 3.87 - 5.11 MIL/uL  Hemoglobin 9.9 (L) 12.0 - 15.0 g/dL   HCT 32.4 (L) 36.0 - 46.0 %   MCV 99.1 78.0 - 100.0 fL   MCH 30.3 26.0 - 34.0 pg   MCHC 30.6 30.0 - 36.0 g/dL   RDW 13.8 11.5 - 15.5 %   Platelets 312 150 - 400 K/uL  Basic metabolic panel     Status: Abnormal   Collection Time: 05/19/17  4:43 AM  Result Value Ref Range   Sodium 141 135 - 145 mmol/L   Potassium 3.3 (L) 3.5 - 5.1 mmol/L    Comment: DELTA CHECK NOTED   Chloride 99 (L) 101 - 111 mmol/L   CO2 30 22 - 32 mmol/L   Glucose, Bld 159 (H) 65 - 99 mg/dL   BUN 48 (H) 6 - 20 mg/dL   Creatinine, Ser 1.79 (H) 0.44 - 1.00 mg/dL   Calcium 7.2 (L) 8.9 - 10.3 mg/dL   GFR calc non Af Amer 26 (L) >60 mL/min   GFR calc Af Amer 31 (L) >60 mL/min    Comment: (NOTE) The eGFR has been calculated using the CKD EPI equation. This calculation has not been validated in all clinical situations. eGFR's persistently <60 mL/min signify possible Chronic Kidney Disease.    Anion gap 12 5 - 15  Glucose, capillary     Status: Abnormal   Collection Time: 05/19/17  7:51 AM  Result Value Ref Range   Glucose-Capillary 146 (H) 65 - 99 mg/dL   Comment 1 Notify RN    Comment 2 Document in Chart   Sedimentation rate     Status: Abnormal   Collection Time: 05/19/17  9:30 AM  Result Value Ref Range   Sed Rate 121 (H) 0 - 22 mm/hr  Glucose, capillary     Status: Abnormal   Collection Time: 05/19/17 12:03 PM  Result Value Ref Range   Glucose-Capillary 170 (H) 65 - 99 mg/dL   Comment 1 Notify RN    Comment 2 Document in Chart   Glucose, capillary     Status: Abnormal   Collection Time: 05/19/17  4:48 PM  Result Value Ref Range   Glucose-Capillary 161 (H) 65 - 99 mg/dL   Comment 1 Notify RN    Comment 2 Document in Chart   Glucose, capillary     Status: None   Collection Time: 05/19/17 10:02 PM  Result Value Ref Range   Glucose-Capillary 71 65 - 99 mg/dL   Comment 1 Notify RN    Comment 2 Document in Chart    Basic metabolic panel     Status: Abnormal   Collection Time: 05/20/17  6:22 AM  Result Value Ref Range   Sodium 139 135 - 145 mmol/L   Potassium 3.0 (L) 3.5 - 5.1 mmol/L   Chloride 95 (L) 101 - 111 mmol/L   CO2 29 22 - 32 mmol/L   Glucose, Bld 255 (H) 65 - 99 mg/dL   BUN 45 (H) 6 - 20 mg/dL   Creatinine, Ser 1.99 (H) 0.44 - 1.00 mg/dL   Calcium 7.1 (L) 8.9 - 10.3 mg/dL   GFR calc non Af Amer 23 (L) >60 mL/min   GFR calc Af Amer 27 (L) >60 mL/min    Comment: (NOTE) The eGFR has been calculated using the CKD EPI equation. This calculation has not been validated in all clinical situations. eGFR's persistently <60 mL/min signify possible Chronic Kidney Disease.    Anion gap 15 5 - 15  CBC  Status: Abnormal   Collection Time: 05/20/17  6:22 AM  Result Value Ref Range   WBC 10.1 4.0 - 10.5 K/uL   RBC 3.35 (L) 3.87 - 5.11 MIL/uL   Hemoglobin 10.4 (L) 12.0 - 15.0 g/dL   HCT 33.2 (L) 36.0 - 46.0 %   MCV 99.1 78.0 - 100.0 fL   MCH 31.0 26.0 - 34.0 pg   MCHC 31.3 30.0 - 36.0 g/dL   RDW 13.8 11.5 - 15.5 %   Platelets 294 150 - 400 K/uL  Glucose, capillary     Status: Abnormal   Collection Time: 05/20/17  8:05 AM  Result Value Ref Range   Glucose-Capillary 263 (H) 65 - 99 mg/dL   Comment 1 Notify RN    Comment 2 Document in Chart     ABGS No results for input(s): PHART, PO2ART, TCO2, HCO3 in the last 72 hours.  Invalid input(s): PCO2 CULTURES Recent Results (from the past 240 hour(s))  Respiratory Panel by PCR     Status: None   Collection Time: 05/18/17 10:12 AM  Result Value Ref Range Status   Adenovirus NOT DETECTED NOT DETECTED Final   Coronavirus 229E NOT DETECTED NOT DETECTED Final   Coronavirus HKU1 NOT DETECTED NOT DETECTED Final   Coronavirus NL63 NOT DETECTED NOT DETECTED Final   Coronavirus OC43 NOT DETECTED NOT DETECTED Final   Metapneumovirus NOT DETECTED NOT DETECTED Final   Rhinovirus / Enterovirus NOT DETECTED NOT DETECTED Final   Influenza A NOT  DETECTED NOT DETECTED Final   Influenza B NOT DETECTED NOT DETECTED Final   Parainfluenza Virus 1 NOT DETECTED NOT DETECTED Final   Parainfluenza Virus 2 NOT DETECTED NOT DETECTED Final   Parainfluenza Virus 3 NOT DETECTED NOT DETECTED Final   Parainfluenza Virus 4 NOT DETECTED NOT DETECTED Final   Respiratory Syncytial Virus NOT DETECTED NOT DETECTED Final   Bordetella pertussis NOT DETECTED NOT DETECTED Final   Chlamydophila pneumoniae NOT DETECTED NOT DETECTED Final   Mycoplasma pneumoniae NOT DETECTED NOT DETECTED Final  Gram stain     Status: None   Collection Time: 05/18/17 10:50 AM  Result Value Ref Range Status   Specimen Description PLEURAL  Final   Special Requests NONE  Final   Gram Stain   Final    NO ORGANISMS SEEN WBC PRESENT,BOTH PMN AND MONONUCLEAR CYTOSPIN SMEAR    Report Status 05/18/2017 FINAL  Final   Studies/Results: Dg Chest 1 View  Result Date: 05/18/2017 CLINICAL DATA:  RIGHT pleural effusion post thoracentesis EXAM: CHEST 1 VIEW COMPARISON:  Chest radiograph 05/16/2017, CT chest 05/18/2017 FINDINGS: Enlargement of cardiac silhouette with pulmonary vascular congestion. Diffuse BILATERAL pulmonary infiltrates, slightly improved in upper lobes. Decreased RIGHT pleural effusion and basilar atelectasis post thoracentesis. No pneumothorax. Bones demineralized. Retained contrast in colon from recent upper GI exam. IMPRESSION: No pneumothorax following RIGHT thoracentesis. Persistent BILATERAL pulmonary infiltrates, improved in upper lobes since 05/16/2017. Electronically Signed   By: Lavonia Dana M.D.   On: 05/18/2017 12:01   Ct Chest Wo Contrast  Result Date: 05/18/2017 CLINICAL DATA:  77 year old female with hypoxia and respiratory failure of unknown etiology. COPD. Patient presented initially in atrial fibrillation with rapid ventricular response. Underwent cardioversion on 05/12/2017. Recent low-grade fever. CHF versus pneumonia. On empiric antibiotics. EXAM: CT  CHEST WITHOUT CONTRAST TECHNIQUE: Multidetector CT imaging of the chest was performed following the standard protocol without IV contrast. COMPARISON:  Chest radiographs 05/16/2017 and earlier. CTA Abdomen and Pelvis 03/14/2014. FINDINGS: Cardiovascular: Chronic mild cardiomegaly. Areas  of chronic pericardial thickening and confluent pericardial calcification with no pericardial effusion. Calcified coronary artery atherosclerosis and/or stents. Vascular patency is not evaluated in the absence of IV contrast. Mediastinum/Nodes: Mildly fluid distended thoracic esophagus. Small to moderate size gastric hiatal hernia appears progressed since 2,015. Small bilateral mediastinal lymph nodes are within normal limits. No mediastinal lymphadenopathy. Lungs/Pleura: Moderate to large layering right pleural effusion. Trace left pleural fluid. Bilateral scattered confluent pulmonary opacity is mostly ground-glass type and peribronchial, although areas of peripheral/subpleural upper lobe involvement are noted. No definite air bronchograms or consolidation. Atelectatic appearance of the trachea and central pulmonary airways. Small volume of retained secretions suspected in the trachea on series 4, image 33. Upper Abdomen: Retained barium type contrast in the large bowel flexures which results in streak artifact through the upper abdomen. Chronic calcified granulomas in the liver and spleen. Although there is a paucity of thoracic aortic calcified atherosclerosis there is moderate to extensive calcified plaque in the upper abdominal aorta and major mesenteric branches. Possible SMA stent. No upper abdominal free fluid. 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 Osteopenia. Mild pectus excavatum. No acute osseous abnormality identified. IMPRESSION: 1. Moderate to large layering right pleural effusion. 2.  Diffuse bilateral confluent but nonspecific pulmonary opacity, mostly ground-glass type. The appearance more resembles widespread pulmonary infection or inflammation rather than asymmetric pulmonary edema. Consider viral/atypical pneumonia as well as noninfectious inflammatory processes - such as pulmonary vasculitis. 3. Chronic calcified pericarditis. No pericardial effusion. Mild cardiomegaly. 4. Mild fluid distension of the thoracic esophagus with moderate hiatal hernia which has progressed since 2015. 5. Calcified coronary artery atherosclerosis and/or stents. Abdominal Aortic Atherosclerosis (ICD10-I70.0). Electronically Signed   By: Genevie Ann M.D.   On: 05/18/2017 09:38   US Thoracentesis Asp Pleural Space W/img Guide  Result Date: 05/18/2017 INDICATION: RIGHT pleural effusion EXAM: ULTRASOUND GUIDED DIAGNOSTIC AND THERAPEUTIC RIGHT THORACENTESIS MEDICATIONS: None. COMPLICATIONS: None immediate. PROCEDURE: Procedure, benefits, and risks of procedure were discussed with patient. Written informed consent for procedure was obtained. Time out protocol followed. Pleural effusion localized by ultrasound at the posterior RIGHT hemithorax. Skin prepped and draped in usual sterile fashion. Skin and soft tissues anesthetized with 10 mL of 1% lidocaine. 8 French thoracentesis catheter placed into the RIGHT pleural space. 530 mL of clear yellow fluid aspirated by syringe pump. Procedure tolerated well by patient without immediate complication. FINDINGS: A total of approximately 530 mL of RIGHT pleural fluid was removed. Samples were sent to the laboratory as requested by the clinical team. IMPRESSION: Successful ultrasound guided RIGHT thoracentesis yielding 530 mL of pleural fluid. Electronically Signed   By: Lavonia Dana M.D.   On: 05/18/2017 11:42    Medications:  Prior to Admission:  Medications Prior to Admission  Medication Sig Dispense Refill Last Dose  . acetaminophen (TYLENOL) 325 MG tablet Take 2 tablets  (650 mg total) by mouth every 6 (six) hours as needed.   unknown  . ALPRAZolam (XANAX) 0.5 MG tablet Take 1 tablet (0.5 mg total) by mouth 2 (two) times daily as needed. 160 tablet 0 05/05/2017 at Unknown time  . amLODipine (NORVASC) 5 MG tablet Take 1 tablet (5 mg total) daily by mouth. For blood pressure 90 tablet 1 05/04/2017 at Unknown time  . aspirin 81 MG tablet Take 81 mg by mouth daily.   unknown  . Calcium Carb-Cholecalciferol (CALCIUM 500 + D3) 500-600 MG-UNIT TABS Take 1 tablet  by mouth daily. 60 tablet  05/04/2017 at Unknown time  . DULoxetine (CYMBALTA) 30 MG capsule Take 1 capsule (30 mg total) by mouth daily. 90 capsule 1 05/04/2017 at Unknown time  . furosemide (LASIX) 40 MG tablet 20 mg. Take one tablet by mouth as needed   05/05/2017 at Unknown time  . Insulin Glargine (LANTUS SOLOSTAR) 100 UNIT/ML Solostar Pen Inject 15 Units into the skin daily with breakfast. (Patient taking differently: Inject 14 Units into the skin daily with breakfast. ) 15 mL 2 05/05/2017 at Unknown time  . KLOR-CON M20 20 MEQ tablet TAKE 1 TABLET EVERY DAY 90 tablet 1 05/04/2017 at Unknown time  . levothyroxine (SYNTHROID, LEVOTHROID) 50 MCG tablet TAKE 1 TABLET EVERY DAY BEFORE BREAKFAST 90 tablet 1 05/05/2017 at Unknown time  . lisinopril (PRINIVIL,ZESTRIL) 40 MG tablet Take 1 tablet (40 mg total) by mouth daily. 30 tablet 6 05/05/2017 at Unknown time  . metoprolol tartrate (LOPRESSOR) 25 MG tablet TAKE 1 TABLET TWICE DAILY 180 tablet 1 05/05/2017 at 0900  . NITROSTAT 0.4 MG SL tablet DISSOLVE  1 TABLET  UNDER THE TONGUE EVERY 5 MINUTES AS NEEDED FOR CHEST PAIN 25 tablet 0 unknown  . NOVOLOG FLEXPEN 100 UNIT/ML FlexPen INJECT 3 TO 5 UNITS SUBCUTANEOUSLY THREE TIMES DAILY WITH MEALS 15 mL 3 05/05/2017 at Unknown time  . PROLIA 60 MG/ML SOLN injection Inject 60 mg into the skin every 6 (six) months. To be administered 08/20/2016 1 each 0 unkmown  . rosuvastatin (CRESTOR) 5 MG tablet Take 1 tablet (5 mg total) daily by  mouth. 90 tablet 1 05/05/2017 at Unknown time   Scheduled: . amiodarone  200 mg Oral BID  . amLODipine  2.5 mg Oral Daily  . dextrose  25 mL Intravenous Once  . DULoxetine  30 mg Oral Daily  . feeding supplement (GLUCERNA SHAKE)  237 mL Oral TID BM  . furosemide  40 mg Intravenous BID  . insulin aspart  0-9 Units Subcutaneous TID WC  . insulin aspart  2 Units Subcutaneous TID WC  . insulin glargine  6 Units Subcutaneous Daily  . levothyroxine  50 mcg Oral QAC breakfast  . linaclotide  290 mcg Oral QAC breakfast  . methylPREDNISolone (SOLU-MEDROL) injection  40 mg Intravenous Q12H  . metoCLOPramide (REGLAN) injection  5 mg Intravenous TID AC  . metoprolol tartrate  50 mg Oral BID  . pantoprazole  40 mg Oral BID AC  . rosuvastatin  5 mg Oral Daily  . sodium chloride flush  3 mL Intravenous Q12H   Continuous: . sodium chloride    . doxycycline (VIBRAMYCIN) IV Stopped (05/20/17 0549)  . piperacillin-tazobactam (ZOSYN)  IV 3.375 g (05/20/17 0549)   GUY:[id number], ALPRAZolam, alum & mag hydroxide-simeth, bisacodyl, HYDROcodone-acetaminophen, promethazine, sodium chloride, sodium chloride flush  Assesment: She has acute respiratory failure with hypoxemia.  This seems to be related to aspiration but she may also have some inflammatory lung disease such as chronic organizing pneumonia formerly known as [place].  I have started steroids .  She is on amiodarone as well which can cause interstitial lung disease.  She has COPD at baseline which of course is undoubtedly contributing to her respiratory failure and the steroid should help with her COPD as well  She has heart failure and some of the findings on her CT could be related to heart failure also.  She has dysphagia with esophageal motility disorder which makes her more likely to have aspiration. Principal Problem:   Atrial fibrillation  with RVR ([place]) Active Problems:   Mixed hyperlipidemia   Essential hypertension   Coronary artery  disease involving native coronary artery of native heart with angina pectoris (HCC)   Chronic diastolic heart failure ([place])   Diabetes type 2, uncontrolled ([place])   Hypothyroidism   Uncontrolled type 2 diabetes mellitus with hyperglycemia (HCC)   Atrial fibrillation, new onset (HCC)   Congestive heart failure (HCC)   Intractable cyclical vomiting with nausea   Dysphagia   Esophageal pain   Hyperglycemia   Hypoglycemia   N&V (nausea and vomiting)    Plan: Continue treatments.  She does say she feels a little better today.    LOS: 15 days   Ellen Woods L 05/20/2017, 8:15 AM

## 2017-05-21 ENCOUNTER — Inpatient Hospital Stay (HOSPITAL_COMMUNITY): Payer: Medicare HMO

## 2017-05-21 LAB — GLUCOSE, CAPILLARY
GLUCOSE-CAPILLARY: 202 mg/dL — AB (ref 65–99)
Glucose-Capillary: 257 mg/dL — ABNORMAL HIGH (ref 65–99)
Glucose-Capillary: 361 mg/dL — ABNORMAL HIGH (ref 65–99)
Glucose-Capillary: 341 mg/dL — ABNORMAL HIGH (ref 65–99)

## 2017-05-21 LAB — CBC
HCT: 33.4 % — ABNORMAL LOW (ref 36.0–46.0)
Hemoglobin: 10.7 g/dL — ABNORMAL LOW (ref 12.0–15.0)
MCH: 31.2 pg (ref 26.0–34.0)
MCHC: 32 g/dL (ref 30.0–36.0)
MCV: 97.4 fL (ref 78.0–100.0)
PLATELETS: 328 10*3/uL (ref 150–400)
RBC: 3.43 MIL/uL — ABNORMAL LOW (ref 3.87–5.11)
RDW: 13.8 % (ref 11.5–15.5)
WBC: 11 10*3/uL — AB (ref 4.0–10.5)

## 2017-05-21 LAB — BASIC METABOLIC PANEL
ANION GAP: 14 (ref 5–15)
BUN: 55 mg/dL — ABNORMAL HIGH (ref 6–20)
CALCIUM: 7.3 mg/dL — AB (ref 8.9–10.3)
CO2: 29 mmol/L (ref 22–32)
CREATININE: 2.33 mg/dL — AB (ref 0.44–1.00)
Chloride: 95 mmol/L — ABNORMAL LOW (ref 101–111)
GFR, EST AFRICAN AMERICAN: 22 mL/min — AB (ref >60)
GFR, EST NON AFRICAN AMERICAN: 19 mL/min — AB (ref >60)
GLUCOSE: 240 mg/dL — AB (ref 65–99)
Potassium: 3.7 mmol/L (ref 3.5–5.1)
Sodium: 138 mmol/L (ref 135–145)

## 2017-05-21 LAB — MAGNESIUM: Magnesium: 1.9 mg/dL (ref 1.7–2.4)

## 2017-05-21 MED ORDER — GLUCERNA SHAKE PO LIQD
237.0000 mL | Freq: Two times a day (BID) | ORAL | Status: DC
  Administered 2017-05-21 – 2017-05-24 (×7): 237 mL via ORAL

## 2017-05-21 MED ORDER — PIPERACILLIN SOD-TAZOBACTAM SO 2.25 (2-0.25) G IV SOLR
2.2500 g | Freq: Three times a day (TID) | INTRAVENOUS | Status: DC
  Administered 2017-05-21 – 2017-05-22 (×3): 2.25 g via INTRAVENOUS
  Filled 2017-05-21 (×12): qty 2.25

## 2017-05-21 MED ORDER — SODIUM CHLORIDE 0.9 % IV BOLUS (SEPSIS)
750.0000 mL | Freq: Once | INTRAVENOUS | Status: AC
  Administered 2017-05-21: 750 mL via INTRAVENOUS

## 2017-05-21 MED ORDER — PIPERACILLIN-TAZOBACTAM IN DEX 2-0.25 GM/50ML IV SOLN
2.2500 g | Freq: Three times a day (TID) | INTRAVENOUS | Status: DC
Start: 2017-05-21 — End: 2017-05-21
  Filled 2017-05-21 (×3): qty 50

## 2017-05-21 MED ORDER — METOPROLOL TARTRATE 25 MG PO TABS
25.0000 mg | ORAL_TABLET | Freq: Two times a day (BID) | ORAL | Status: DC
  Administered 2017-05-21 – 2017-05-25 (×5): 25 mg via ORAL
  Filled 2017-05-21 (×6): qty 1

## 2017-05-21 MED ORDER — SODIUM CHLORIDE 0.9 % IV BOLUS (SEPSIS): 750.0000 mL | Freq: Once | INTRAVENOUS | Status: DC

## 2017-05-21 NOTE — Progress Notes (Signed)
Pharmacy Antibiotic Note  Melody HaverGlenda J Dimascio is a 77 y.o. female admitted on 05/05/2017 with aspiration pneumonia.  Pharmacy consulted for zosyn dosing on 05/16/2017.  Worsening renal function.  Plan: Decrease Zosyn 2.255g IV q8h Monitor labs, progress, c/s  Height: 4\' 11"  (149.9 cm) Weight: 131 lb 6.3 oz (59.6 kg) IBW/kg (Calculated) : 43.2  Temp (24hrs), Avg:98 F (36.7 C), Min:97.4 F (36.3 C), Max:98.4 F (36.9 C)  Recent Labs  Lab 05/17/17 0551 05/18/17 0446 05/19/17 0443 05/20/17 0622 05/21/17 0504  WBC 10.7* 12.9* 11.5* 10.1 11.0*  CREATININE 1.83* 1.83* 1.79* 1.99* 2.33*    Estimated Creatinine Clearance: 16.1 mL/min (A) (by C-G formula based on SCr of 2.33 mg/dL (H)).    Allergies  Allergen Reactions  . Codeine     REACTION: nausea  . Invokana [Canagliflozin]   . Naproxen Other (See Comments)    Abdominal pain  . Azithromycin Other (See Comments)    Hospital reaction   Thank you for allowing pharmacy to be a part of this patient's care.  Mady GemmaHayes, Sequoyah Ramone R, [facility] Inc[facility]PH 05/21/2017 2:32 PM

## 2017-05-21 NOTE — Progress Notes (Signed)
Subjective: She says she feels a little bit better.  She has no new complaints.  She is coughing up a little bit of sputum.  Speech evaluation noted and I agree she is at risk of aspiration and I think she did aspirate.  However her sedimentation rate was elevated so she is going to be treated for inflammatory pulmonary disease as well.  She seems to be improving with current treatment  Objective: Vital signs in last 24 hours: Temp:  [97.4 F (36.3 C)-98.2 F (36.8 C)] 97.4 F (36.3 C) (01/17 0603) Pulse Rate:  [58-68] 68 (01/17 0818) Resp:  [16-20] 18 (01/17 0818) BP: (106-121)/(39-48) 117/40 (01/17 0818) SpO2:  [92 %-99 %] 98 % (01/17 0818) Weight:  [59.6 kg (131 lb 6.3 oz)] 59.6 kg (131 lb 6.3 oz) (01/17 0603) Weight change: 0.3 kg (10.6 oz) Last BM Date: 05/13/17  Intake/Output from previous day: 01/16 0701 - 01/17 0700 In: 229 [P.O.:120; I.V.:9; IV Piggyback:100] Out: 100 [Urine:100]  PHYSICAL EXAM General appearance: alert, cooperative and no distress Resp: She has significantly less rales than she did yesterday Cardio: regular rate and rhythm, S1, S2 normal, no murmur, click, rub or gallop GI: soft, non-tender; bowel sounds normal; no masses,  no organomegaly Extremities: extremities normal, atraumatic, no cyanosis or edema Skin warm and dry  Lab Results:  Results for orders placed or performed during the hospital encounter of 05/05/17 (from the past 48 hour(s))  Sedimentation rate     Status: Abnormal   Collection Time: 05/19/17  9:30 AM  Result Value Ref Range   Sed Rate 121 (H) 0 - 22 mm/hr  Glucose, capillary     Status: Abnormal   Collection Time: 05/19/17 12:03 PM  Result Value Ref Range   Glucose-Capillary 170 (H) 65 - 99 mg/dL   Comment 1 Notify RN    Comment 2 Document in Chart   Glucose, capillary     Status: Abnormal   Collection Time: 05/19/17  4:48 PM  Result Value Ref Range   Glucose-Capillary 161 (H) 65 - 99 mg/dL   Comment 1 Notify RN    Comment  2 Document in Chart   Glucose, capillary     Status: None   Collection Time: 05/19/17 10:02 PM  Result Value Ref Range   Glucose-Capillary 71 65 - 99 mg/dL   Comment 1 Notify RN    Comment 2 Document in Chart   Basic metabolic panel     Status: Abnormal   Collection Time: 05/20/17  6:22 AM  Result Value Ref Range   Sodium 139 135 - 145 mmol/L   Potassium 3.0 (L) 3.5 - 5.1 mmol/L   Chloride 95 (L) 101 - 111 mmol/L   CO2 29 22 - 32 mmol/L   Glucose, Bld 255 (H) 65 - 99 mg/dL   BUN 45 (H) 6 - 20 mg/dL   Creatinine, Ser 1.99 (H) 0.44 - 1.00 mg/dL   Calcium 7.1 (L) 8.9 - 10.3 mg/dL   GFR calc non Af Amer 23 (L) >60 mL/min   GFR calc Af Amer 27 (L) >60 mL/min    Comment: (NOTE) The eGFR has been calculated using the CKD EPI equation. This calculation has not been validated in all clinical situations. eGFR's persistently <60 mL/min signify possible Chronic Kidney Disease.    Anion gap 15 5 - 15  CBC     Status: Abnormal   Collection Time: 05/20/17  6:22 AM  Result Value Ref Range   WBC 10.1 4.0 -  10.5 K/uL   RBC 3.35 (L) 3.87 - 5.11 MIL/uL   Hemoglobin 10.4 (L) 12.0 - 15.0 g/dL   HCT 33.2 (L) 36.0 - 46.0 %   MCV 99.1 78.0 - 100.0 fL   MCH 31.0 26.0 - 34.0 pg   MCHC 31.3 30.0 - 36.0 g/dL   RDW 13.8 11.5 - 15.5 %   Platelets 294 150 - 400 K/uL  Glucose, capillary     Status: Abnormal   Collection Time: 05/20/17  8:05 AM  Result Value Ref Range   Glucose-Capillary 263 (H) 65 - 99 mg/dL   Comment 1 Notify RN    Comment 2 Document in Chart   Glucose, capillary     Status: Abnormal   Collection Time: 05/20/17 11:59 AM  Result Value Ref Range   Glucose-Capillary 215 (H) 65 - 99 mg/dL   Comment 1 Notify RN    Comment 2 Document in Chart   Glucose, capillary     Status: Abnormal   Collection Time: 05/20/17  4:39 PM  Result Value Ref Range   Glucose-Capillary 233 (H) 65 - 99 mg/dL   Comment 1 Notify RN    Comment 2 Document in Chart   Glucose, capillary     Status: Abnormal    Collection Time: 05/20/17  9:20 PM  Result Value Ref Range   Glucose-Capillary 161 (H) 65 - 99 mg/dL   Comment 1 Notify RN    Comment 2 Document in Chart   CBC     Status: Abnormal   Collection Time: 05/21/17  5:04 AM  Result Value Ref Range   WBC 11.0 (H) 4.0 - 10.5 K/uL   RBC 3.43 (L) 3.87 - 5.11 MIL/uL   Hemoglobin 10.7 (L) 12.0 - 15.0 g/dL   HCT 33.4 (L) 36.0 - 46.0 %   MCV 97.4 78.0 - 100.0 fL   MCH 31.2 26.0 - 34.0 pg   MCHC 32.0 30.0 - 36.0 g/dL   RDW 13.8 11.5 - 15.5 %   Platelets 328 150 - 400 K/uL  Basic metabolic panel     Status: Abnormal   Collection Time: 05/21/17  5:04 AM  Result Value Ref Range   Sodium 138 135 - 145 mmol/L   Potassium 3.7 3.5 - 5.1 mmol/L    Comment: DELTA CHECK NOTED   Chloride 95 (L) 101 - 111 mmol/L   CO2 29 22 - 32 mmol/L   Glucose, Bld 240 (H) 65 - 99 mg/dL   BUN 55 (H) 6 - 20 mg/dL   Creatinine, Ser 2.33 (H) 0.44 - 1.00 mg/dL   Calcium 7.3 (L) 8.9 - 10.3 mg/dL   GFR calc non Af Amer 19 (L) >60 mL/min   GFR calc Af Amer 22 (L) >60 mL/min    Comment: (NOTE) The eGFR has been calculated using the CKD EPI equation. This calculation has not been validated in all clinical situations. eGFR's persistently <60 mL/min signify possible Chronic Kidney Disease.    Anion gap 14 5 - 15  Magnesium     Status: None   Collection Time: 05/21/17  5:04 AM  Result Value Ref Range   Magnesium 1.9 1.7 - 2.4 mg/dL  Glucose, capillary     Status: Abnormal   Collection Time: 05/21/17  7:46 AM  Result Value Ref Range   Glucose-Capillary 257 (H) 65 - 99 mg/dL   Comment 1 Notify RN    Comment 2 Document in Chart     ABGS No results for input(s): PHART, PO2ART,  TCO2, HCO3 in the last 72 hours.  Invalid input(s): PCO2 CULTURES Recent Results (from the past 240 hour(s))  Respiratory Panel by PCR     Status: None   Collection Time: 05/18/17 10:12 AM  Result Value Ref Range Status   Adenovirus NOT DETECTED NOT DETECTED Final   Coronavirus 229E NOT  DETECTED NOT DETECTED Final   Coronavirus HKU1 NOT DETECTED NOT DETECTED Final   Coronavirus NL63 NOT DETECTED NOT DETECTED Final   Coronavirus OC43 NOT DETECTED NOT DETECTED Final   Metapneumovirus NOT DETECTED NOT DETECTED Final   Rhinovirus / Enterovirus NOT DETECTED NOT DETECTED Final   Influenza A NOT DETECTED NOT DETECTED Final   Influenza B NOT DETECTED NOT DETECTED Final   Parainfluenza Virus 1 NOT DETECTED NOT DETECTED Final   Parainfluenza Virus 2 NOT DETECTED NOT DETECTED Final   Parainfluenza Virus 3 NOT DETECTED NOT DETECTED Final   Parainfluenza Virus 4 NOT DETECTED NOT DETECTED Final   Respiratory Syncytial Virus NOT DETECTED NOT DETECTED Final   Bordetella pertussis NOT DETECTED NOT DETECTED Final   Chlamydophila pneumoniae NOT DETECTED NOT DETECTED Final   Mycoplasma pneumoniae NOT DETECTED NOT DETECTED Final  Gram stain     Status: None   Collection Time: 05/18/17 10:50 AM  Result Value Ref Range Status   Specimen Description PLEURAL  Final   Special Requests NONE  Final   Gram Stain   Final    NO ORGANISMS SEEN WBC PRESENT,BOTH PMN AND MONONUCLEAR CYTOSPIN SMEAR    Report Status 05/18/2017 FINAL  Final   Studies/Results: No results found.  Medications:  Prior to Admission:  Medications Prior to Admission  Medication Sig Dispense Refill Last Dose  . acetaminophen (TYLENOL) 325 MG tablet Take 2 tablets (650 mg total) by mouth every 6 (six) hours as needed.   unknown  . ALPRAZolam (XANAX) 0.5 MG tablet Take 1 tablet (0.5 mg total) by mouth 2 (two) times daily as needed. 160 tablet 0 05/05/2017 at Unknown time  . amLODipine (NORVASC) 5 MG tablet Take 1 tablet (5 mg total) daily by mouth. For blood pressure 90 tablet 1 05/04/2017 at Unknown time  . aspirin 81 MG tablet Take 81 mg by mouth daily.   unknown  . Calcium Carb-Cholecalciferol (CALCIUM 500 + D3) 500-600 MG-UNIT TABS Take 1 tablet by mouth daily. 60 tablet  05/04/2017 at Unknown time  . DULoxetine  (CYMBALTA) 30 MG capsule Take 1 capsule (30 mg total) by mouth daily. 90 capsule 1 05/04/2017 at Unknown time  . furosemide (LASIX) 40 MG tablet 20 mg. Take one tablet by mouth as needed   05/05/2017 at Unknown time  . Insulin Glargine (LANTUS SOLOSTAR) 100 UNIT/ML Solostar Pen Inject 15 Units into the skin daily with breakfast. (Patient taking differently: Inject 14 Units into the skin daily with breakfast. ) 15 mL 2 05/05/2017 at Unknown time  . KLOR-CON M20 20 MEQ tablet TAKE 1 TABLET EVERY DAY 90 tablet 1 05/04/2017 at Unknown time  . levothyroxine (SYNTHROID, LEVOTHROID) 50 MCG tablet TAKE 1 TABLET EVERY DAY BEFORE BREAKFAST 90 tablet 1 05/05/2017 at Unknown time  . lisinopril (PRINIVIL,ZESTRIL) 40 MG tablet Take 1 tablet (40 mg total) by mouth daily. 30 tablet 6 05/05/2017 at Unknown time  . metoprolol tartrate (LOPRESSOR) 25 MG tablet TAKE 1 TABLET TWICE DAILY 180 tablet 1 05/05/2017 at 0900  . NITROSTAT 0.4 MG SL tablet DISSOLVE  1 TABLET  UNDER THE TONGUE EVERY 5 MINUTES AS NEEDED FOR CHEST PAIN 25 tablet  0 unknown  . NOVOLOG FLEXPEN 100 UNIT/ML FlexPen INJECT 3 TO 5 UNITS SUBCUTANEOUSLY THREE TIMES DAILY WITH MEALS 15 mL 3 05/05/2017 at Unknown time  . PROLIA 60 MG/ML SOLN injection Inject 60 mg into the skin every 6 (six) months. To be administered 08/20/2016 1 each 0 unkmown  . rosuvastatin (CRESTOR) 5 MG tablet Take 1 tablet (5 mg total) daily by mouth. 90 tablet 1 05/05/2017 at Unknown time   Scheduled: . amiodarone  200 mg Oral BID  . amLODipine  2.5 mg Oral Daily  . dextrose  25 mL Intravenous Once  . DULoxetine  30 mg Oral Daily  . feeding supplement (GLUCERNA SHAKE)  237 mL Oral TID BM  . furosemide  40 mg Intravenous BID  . insulin aspart  0-9 Units Subcutaneous TID WC  . insulin aspart  2 Units Subcutaneous TID WC  . insulin glargine  6 Units Subcutaneous Daily  . levothyroxine  50 mcg Oral QAC breakfast  . linaclotide  290 mcg Oral QAC breakfast  . methylPREDNISolone (SOLU-MEDROL)  injection  40 mg Intravenous Q12H  . metoCLOPramide (REGLAN) injection  5 mg Intravenous TID AC  . metoprolol tartrate  50 mg Oral BID  . pantoprazole  40 mg Oral BID AC  . rivaroxaban  15 mg Oral Q supper  . rosuvastatin  5 mg Oral Daily  . sodium chloride flush  3 mL Intravenous Q12H   Continuous: . sodium chloride    . piperacillin-tazobactam (ZOSYN)  IV 3.375 g (05/21/17 0656)   SVX:[id number], ALPRAZolam, alum & mag hydroxide-simeth, bisacodyl, HYDROcodone-acetaminophen, promethazine, sodium chloride, sodium chloride flush  Assesment: She has abnormal chest CT that I think is likely multifactorial.  I believe she did aspirate and has aspiration pneumonia for which she is being treated.  She also may have inflammatory lung disease such as chronic organizing pneumonia.  She is being treated with steroids which will help with her known COPD and is the treatment for the inflammatory lung disease.  When she was admitted she had atrial fib with rapid ventricular response and acute on chronic diastolic heart failure with pulmonary edema.  The abnormalities on her chest x-ray could still be related to fluid in her chest as well.  She has had cardioversion and seems to be in sinus rhythm  She has significant dysphagia related to esophageal motility disorder and as mentioned is high risk for aspiration.  She has had trouble with vomiting after she eats.  She overall looks substantially better than when I first saw her Principal Problem:   Atrial fibrillation with RVR ([place]) Active Problems:   Mixed hyperlipidemia   Essential hypertension   Coronary artery disease involving native coronary artery of native heart with angina pectoris (HCC)   Chronic diastolic heart failure (HCC)   Diabetes type 2, uncontrolled ([place])   Hypothyroidism   Uncontrolled type 2 diabetes mellitus with hyperglycemia (HCC)   Atrial fibrillation, new onset (HCC)   Congestive heart failure (HCC)   Intractable  cyclical vomiting with nausea   Dysphagia   Esophageal pain   Hyperglycemia   Hypoglycemia   N&V (nausea and vomiting)    Plan: Continue steroids.  Continue antibiotics.    LOS: 16 days   Meredyth Hornung L 05/21/2017, 8:31 AM

## 2017-05-21 NOTE — Progress Notes (Signed)
PROGRESS NOTE    Ellen Woods   ZOX:[id number]  DOB: 12/10/1940  DOA: 05/05/2017 PCP: Dettinger, Elige Radon, MD   Brief Narrative:  Ellen Woods is a 77 y.o. female with medical history significant of COPD, coronary artery disease, diabetes mellitus type 2, dyspnea on exertion who presented to the emergency department with palpitations and facial swelling. In the ER she was found to have A-fib with RVR and pulmonary edema. She has had a prolonged hospital stay. She underwent cardioversion on 1/8 and has been in NSR since.  She developed acute respiratory failure which we are still trying to resolve- see below.  She had complaints of dysphagia, regurgitation and vomiting which were evaluated with an EGD and an esophagram.  She continues to complain of vomiting and regurgitation. Please note that the patient is a poor historian and is difficult to get a full history out of her without asking multiple questions repeatedly.   Subjective: Pt says that she feels nauseated this morning but she did eat her oatmeal completely.  No SOB.  No palpitations.    Assessment & Plan:   Atrial fibrillation with RVR - underwent TEE cardioversion on 1/8 - CHA2DS2-VASc Score 5 - being managed by cardiology with Amiodarone and Lopressor - has been on Coumadin but was put on hold for EGD -  on NOAC now - HR has been controlled and stable.  Cardioversion was successful.   Nausea/ vomiting- esophageal stricture - occasional dysphagia and regurgitation of food per daughter  - upper GI series shows severe impairment of esophageal motility, a narrowing at the GE junction, a hiatal hernia and possible GOO vs gastroparesis - she needed esophageal dilation about 15 yrs ago  - 1/11- EGD did not reveal any stricture- GI junction was "empirically dilated"  In case she has achalasia - her regurgitation symptoms are likely due to severe esophageal dysmotility and a hiatal hernia- she continues to  have episodes of regurgitation and vomiting   GI is recommending outpatient manometry study.    Dyspnea/ acute resp distress- due to acute d CHF and or b/l lobar pneumonia - 1/9-  RR is in 20s on my exam and there are crackles at bases -  - per patient, she has had a cough for about 1 wk - CXR >>- is not clear on whether this is pneumonia or CHF  - started on lasix and Levaquin  - 1/10- mild improvement noted- she states cough is gone but she is still hypoxic- cont to diurese and treat with Levaquin and attempt to wean O2 1/11- symptoms appear to be improving- she is not dyspneic today- wean O2- cont diuretics and antibiotics 1/12- repeat CXR shows bilateral multifocal pneumonia which is quite extensive in the right lung base- ? If she aspirated in relation to above esophageal dysmotility- will change Levaquin to Zosyn today- she still has extensive crackles on exam and is still requiring O2- lasix held  - 1/13- cont current management with anibiotics- wean O2 as able - 1/14- continues to be hypoxic- WBC improving- have obtained a CT of the chest which reveals a mod-large right sided pleural effusion which was not seen on the CXR and multiple infiltrates- difficult to tell if this is CHF or an atypical infection or autoimmune - s/p thoracentesis ...530 cc of yellow fluid obtained- pleural fluid results reveal a transudate and lasix has been resumed - 1/14- given a dose of Levaquin in case we are missing an atypical organism  with Zosyn -- cont to follow- have asked for pulmonary consult  - continue antibiotics  Severe constipation  - on Linzess - not much stool output after suppositories- she has not had much to eat in the hospital and may not have much stool to pass- follow    Chronic diastolic heart failure  - see above -pleural fluid looks like transudate - Lasix resumed- follow I and O and daily weights  CKD 3-4   - stable, following.   Hypokalemia - ordered replacement, follow.   Check magnesium.    Brittle, insulin dependent Diabetes type 2 - continue to monitor closely as her diet intake is fluctuating, high risk for hypoglycemia CBG (last 3)  Recent Labs    05/20/17 1639 05/20/17 2120 05/21/17 0746  GLUCAP 233* 161* 257*     Hypothyroidism - continue home dose Synthroid    DVT prophylaxis: SCDs Code Status: Full code Family Communication: daughter Disposition Plan:  Consultants:   Cardiology  GI Procedures:  2 D ECHO Left ventricle: The cavity size was normal. Wall thickness was  normal. Systolic function was normal. The estimated ejection   fraction was in the range of 60% to 65%. Wall motion was normal;  there were no regional wall motion abnormalities. - Aortic valve: Mildly calcified annulus. Trileaflet; normal thickness leaflets. Valve area (VTI): 1.87 cm^2. - Mitral valve: There was mild regurgitation. - Left atrium: The atrium was moderately dilated. - Right atrium: The atrium was mildly dilated. - Atrial septum: No defect or patent foramen ovale was identified. - Pulmonary arteries: Systolic pressure was mildly to moderately  increased. PA peak pressure: 40 mm Hg (S).  TEE cardioversion  Antimicrobials:  Anti-infectives (From admission, onward)   Start     Dose/Rate Route Frequency Ordered Stop   05/19/17 1300  doxycycline (VIBRAMYCIN) 100 mg in dextrose 5 % 250 mL IVPB  Status:  Discontinued     100 mg 125 mL/hr over 120 Minutes Intravenous Every 12 hours 05/19/17 1254 05/20/17 1520   05/18/17 0815  levofloxacin (LEVAQUIN) IVPB 500 mg  Status:  Discontinued     500 mg 100 mL/hr over 60 Minutes Intravenous Every 48 hours 05/18/17 0748 05/18/17 1048   05/16/17 1030  piperacillin-tazobactam (ZOSYN) IVPB 3.375 g     3.375 g 12.5 mL/hr over 240 Minutes Intravenous Every 8 hours 05/16/17 1008     05/15/17 1800  levofloxacin (LEVAQUIN) IVPB 500 mg  Status:  Discontinued     500 mg 100 mL/hr over 60 Minutes Intravenous Every 48 hours  05/15/17 1204 05/16/17 0941   05/13/17 1800  levofloxacin (LEVAQUIN) IVPB 750 mg  Status:  Discontinued     750 mg 100 mL/hr over 90 Minutes Intravenous Every 48 hours 05/13/17 1610 05/15/17 1204     Objective: Vitals:   05/20/17 2059 05/20/17 2300 05/21/17 0603 05/21/17 0818  BP:  (!) 106/39 (!) 106/46 (!) 117/40  Pulse:  (!) 58 (!) 58 68  Resp:   16 18  Temp:  97.9 F (36.6 C) (!) 97.4 F (36.3 C) 98.4 F (36.9 C)  TempSrc:   Oral Oral  SpO2: 92% 95% 99% 98%  Weight:   59.6 kg (131 lb 6.3 oz)   Height:        Intake/Output Summary (Last 24 hours) at 05/21/2017 1222 Last data filed at 05/21/2017 1056 Gross per 24 hour  Intake 229 ml  Output 100 ml  Net 129 ml   Filed Weights   05/19/17 0511 05/20/17 0521  05/21/17 0603  Weight: 59.8 kg (131 lb 13.4 oz) 59.3 kg (130 lb 11.7 oz) 59.6 kg (131 lb 6.3 oz)   Examination: General exam: Appears comfortable, sitting up in chair.  NAD.  HEENT: PERRLA, oral mucosa moist, no sclera icterus or thrush Respiratory system: BBS fairly clear.  Cardiovascular system: S1 & S2 heard.  No murmurs, rubs, gallops.  Gastrointestinal system: Abdomen soft, non-tender, nondistended. Normal bowel sound. No organomegaly. Central nervous system: Alert and oriented. No focal neurological deficits. Extremities: No cyanosis, clubbing or edema. Skin: No rashes or ulcers. Psychiatry:  Mood & affect appropriate.   Data Reviewed: I have personally reviewed following labs and imaging studies  CBC: Recent Labs  Lab 05/17/17 0551 05/18/17 0446 05/19/17 0443 05/20/17 0622 05/21/17 0504  WBC 10.7* 12.9* 11.5* 10.1 11.0*  HGB 10.7* 10.7* 9.9* 10.4* 10.7*  HCT 34.8* 35.0* 32.4* 33.2* 33.4*  MCV 100.6* 100.0 99.1 99.1 97.4  PLT 310 333 312 294 328   Basic Metabolic Panel: Recent Labs  Lab 05/17/17 0551 05/17/17 1230 05/18/17 0446 05/19/17 0443 05/20/17 0622 05/21/17 0504  NA 140  --  144 141 139 138  K 3.1*  --  4.2 3.3* 3.0* 3.7  CL 96*  --   100* 99* 95* 95*  CO2 28  --  32 30 29 29   GLUCOSE 217*  --  184* 159* 255* 240*  BUN 47*  --  51* 48* 45* 55*  CREATININE 1.83*  --  1.83* 1.79* 1.99* 2.33*  CALCIUM 7.4*  --  7.7* 7.2* 7.1* 7.3*  MG  --  2.2 2.0  --   --  1.9   GFR: Estimated Creatinine Clearance: 16.1 mL/min (A) (by C-G formula based on SCr of 2.33 mg/dL (H)). Liver Function Tests: No results for input(s): AST, ALT, ALKPHOS, BILITOT, PROT, ALBUMIN in the last 168 hours. No results for input(s): LIPASE, AMYLASE in the last 168 hours. No results for input(s): AMMONIA in the last 168 hours. Coagulation Profile: Recent Labs  Lab 05/15/17 0525 05/16/17 0555 05/17/17 0511  INR 1.96 2.68 3.27   Cardiac Enzymes: No results for input(s): CKTOTAL, CKMB, CKMBINDEX, TROPONINI in the last 168 hours. BNP (last 3 results) No results for input(s): PROBNP in the last 8760 hours. HbA1C: No results for input(s): HGBA1C in the last 72 hours. CBG: Recent Labs  Lab 05/20/17 0805 05/20/17 1159 05/20/17 1639 05/20/17 2120 05/21/17 0746  GLUCAP 263* 215* 233* 161* 257*   Lipid Profile: No results for input(s): CHOL, HDL, LDLCALC, TRIG, CHOLHDL, LDLDIRECT in the last 72 hours. Thyroid Function Tests: No results for input(s): TSH, T4TOTAL, FREET4, T3FREE, THYROIDAB in the last 72 hours. Anemia Panel: No results for input(s): VITAMINB12, FOLATE, FERRITIN, TIBC, IRON, RETICCTPCT in the last 72 hours. Urine analysis:    Component Value Date/Time   COLORURINE YELLOW 03/14/2014 1227   APPEARANCEUR CLEAR 03/14/2014 1227   LABSPEC 1.015 03/14/2014 1227   PHURINE 5.0 03/14/2014 1227   GLUCOSEU 100 (A) 03/14/2014 1227   HGBUR NEGATIVE 03/14/2014 1227   BILIRUBINUR neg 06/13/2015 1710   KETONESUR NEGATIVE 03/14/2014 1227   PROTEINUR neg 06/13/2015 1710   PROTEINUR NEGATIVE 03/14/2014 1227   UROBILINOGEN negative 06/13/2015 1710   UROBILINOGEN 0.2 03/14/2014 1227   NITRITE neg 06/13/2015 1710   NITRITE NEGATIVE 03/14/2014  1227   LEUKOCYTESUR Negative 06/13/2015 1710   Recent Results (from the past 240 hour(s))  Respiratory Panel by PCR     Status: None   Collection Time: 05/18/17 10:12  AM  Result Value Ref Range Status   Adenovirus NOT DETECTED NOT DETECTED Final   Coronavirus 229E NOT DETECTED NOT DETECTED Final   Coronavirus HKU1 NOT DETECTED NOT DETECTED Final   Coronavirus NL63 NOT DETECTED NOT DETECTED Final   Coronavirus OC43 NOT DETECTED NOT DETECTED Final   Metapneumovirus NOT DETECTED NOT DETECTED Final   Rhinovirus / Enterovirus NOT DETECTED NOT DETECTED Final   Influenza A NOT DETECTED NOT DETECTED Final   Influenza B NOT DETECTED NOT DETECTED Final   Parainfluenza Virus 1 NOT DETECTED NOT DETECTED Final   Parainfluenza Virus 2 NOT DETECTED NOT DETECTED Final   Parainfluenza Virus 3 NOT DETECTED NOT DETECTED Final   Parainfluenza Virus 4 NOT DETECTED NOT DETECTED Final   Respiratory Syncytial Virus NOT DETECTED NOT DETECTED Final   Bordetella pertussis NOT DETECTED NOT DETECTED Final   Chlamydophila pneumoniae NOT DETECTED NOT DETECTED Final   Mycoplasma pneumoniae NOT DETECTED NOT DETECTED Final  Gram stain     Status: None   Collection Time: 05/18/17 10:50 AM  Result Value Ref Range Status   Specimen Description PLEURAL  Final   Special Requests NONE  Final   Gram Stain   Final    NO ORGANISMS SEEN WBC PRESENT,BOTH PMN AND MONONUCLEAR CYTOSPIN SMEAR    Report Status 05/18/2017 FINAL  Final    Radiology Studies: Dg Chest Port 1 View  Result Date: 05/21/2017 CLINICAL DATA:  Acute on chronic respiratory failure EXAM: PORTABLE CHEST 1 VIEW COMPARISON:  05/18/2017 FINDINGS: Diffuse bilateral airspace disease unchanged. Small bilateral effusions and bibasilar atelectasis unchanged. Cardiac enlargement. IMPRESSION: Diffuse bilateral airspace disease and small bilateral effusions unchanged. Electronically Signed   By: Marlan Palau M.D.   On: 05/21/2017 08:59   Scheduled Meds: .  amiodarone  200 mg Oral BID  . amLODipine  2.5 mg Oral Daily  . dextrose  25 mL Intravenous Once  . DULoxetine  30 mg Oral Daily  . feeding supplement (GLUCERNA SHAKE)  237 mL Oral TID BM  . furosemide  40 mg Intravenous BID  . insulin aspart  0-9 Units Subcutaneous TID WC  . insulin aspart  2 Units Subcutaneous TID WC  . insulin glargine  6 Units Subcutaneous Daily  . levothyroxine  50 mcg Oral QAC breakfast  . linaclotide  290 mcg Oral QAC breakfast  . methylPREDNISolone (SOLU-MEDROL) injection  40 mg Intravenous Q12H  . metoCLOPramide (REGLAN) injection  5 mg Intravenous TID AC  . metoprolol tartrate  50 mg Oral BID  . pantoprazole  40 mg Oral BID AC  . rivaroxaban  15 mg Oral Q supper  . rosuvastatin  5 mg Oral Daily  . sodium chloride flush  3 mL Intravenous Q12H   Continuous Infusions: . sodium chloride    . piperacillin-tazobactam (ZOSYN)  IV Stopped (05/21/17 1056)    LOS: 16 days   Standley Dakins, MD [facility] Pager: [phone] [email or web address] Password [facility] 05/21/2017, 12:22 PM

## 2017-05-21 NOTE — Progress Notes (Signed)
Initial Nutrition Assessment  DOCUMENTATION CODES:  Not applicable  INTERVENTION:  Changed admin times of Glucerna per pt request  Took list of items patient preferred or thought she would do well with- pass to dietary  Confirm diet order- Dysphagia 2 textures  NUTRITION DIAGNOSIS:  Inadequate oral intake related to dysphagia, nausea, poor appetite as evidenced by meal completion < 50%.  GOAL:  Patient will meet greater than or equal to 90% of their needs  MONITOR:  PO intake, Supplement acceptance, Labs, Weight trends, I & O's  REASON FOR ASSESSMENT:  LOS    ASSESSMENT:  77 y/o female PMHx Depression, Anxiety, COPD, HF, CAD, DM2. Originally presented to ED w/ facial swelling and heart palpitations. Pt also reported sime vomiting thought due to throat requiring stretching. She was found to be in Afib s/p cardioversion 1/7. Hospitalization prolonged due to dysphagia (EGD 1/11-suspected underlying dysmoltility), persistent n/v and persistent respiratory distress.   Reviewed intake documentation. Patient has eaten poorly (</=50%) for all meals over last 10 days, mostly has been eating ~25% of meals. She has supplements ordered (Glucerna), appears to refuse more often than except this- has refused that last 7. Her wt has slowly increased. Admitted at ~ 123 lbs and now over 131 lbs.   Patient seen today at [facility] time. Eating quite gingerly. Likely only ate a total of a few spoonfuls while I was present. She appears quite weak and required assistance unwrapping/moving items around.  Noted her meals are not chopped up as per SLP recommendations, will try to clarify diet order w/ dietary ambassadors. Patient says she doesn't think she would be able to eat the chicken anyway, even if it was chopped.   She is a poor historian and cannot give much information on barriers to intake. She still notes trouble swallowing/chewing.   She does say that she has been refusing the oral supplements  because they arrive too quickly after meals and if she drinks them, she gets nauseated.Will alter delivery times.   RD also mentioned many different foods and tried to determine what sounded good to her and what she thought she could eat well. Obtained list and will pass to dietary.   Physical Exam: WDL  Labs: Glu: 160-260, Creat/BUN:2.33/55 (trending up), WBC:11.0 Meds: Lasix, Glucerna, Insuiln, Linzess. Methylprednisolone, Reglan, PPI, IV abx  Recent Labs  Lab 05/17/17 1230 05/18/17 0446 05/19/17 0443 05/20/17 0622 05/21/17 0504  NA  --  144 141 139 138  K  --  4.2 3.3* 3.0* 3.7  CL  --  100* 99* 95* 95*  CO2  --  32 30 29 29   BUN  --  51* 48* 45* 55*  CREATININE  --  1.83* 1.79* 1.99* 2.33*  CALCIUM  --  7.7* 7.2* 7.1* 7.3*  MG 2.2 2.0  --   --  1.9  GLUCOSE  --  184* 159* 255* 240*   NUTRITION - FOCUSED PHYSICAL EXAM:   Most Recent Value  Orbital Region  No depletion  Upper Arm Region  No depletion  Thoracic and Lumbar Region  No depletion  Buccal Region  No depletion  Temple Region  No depletion  Clavicle Bone Region  No depletion  Clavicle and Acromion Bone Region  No depletion  Scapular Bone Region  No depletion  Dorsal Hand  No depletion  Patellar Region  No depletion  Anterior Thigh Region  No depletion  Posterior Calf Region  No depletion  Edema (RD Assessment)  Unable to assess  Diet Order:  Diet Carb Modified Fluid consistency: Thin; Room service appropriate? Yes  EDUCATION NEEDS:  No education needs have been identified at this time  Skin:  Skin Assessment: Reviewed RN Assessment  Last BM:  1/16  Height:  Ht Readings from Last 1 Encounters:  05/05/17 4\' 11"  (1.499 m)   Weight:  Wt Readings from Last 1 Encounters:  05/21/17 131 lb 6.3 oz (59.6 kg)   Wt Readings from Last 10 Encounters:  05/21/17 131 lb 6.3 oz (59.6 kg)  03/25/17 118 lb (53.5 kg)  03/16/17 121 lb (54.9 kg)  02/26/17 123 lb (55.8 kg)  02/23/17 124 lb (56.2 kg)   02/04/17 120 lb (54.4 kg)  01/21/17 120 lb (54.4 kg)  12/11/16 113 lb (51.3 kg)  12/08/16 114 lb (51.7 kg)  11/04/16 116 lb (52.6 kg)   Ideal Body Weight:  44.7 kg  BMI:  Body mass index is 24.8 kg/m.  Using Dry wt as doing (55.8 kg) Estimated Nutritional Needs:  Kcal:  1450-1650 (26-30 kcal/kg dbw) Protein:  67-78g Pro (1.2-1.4 g/kg dbw) Fluid:  1.4-1.6 L Fluid ( 631ml/kcal)  Christophe LouisNathan Sujey Gundry RD, LDN, CNSC Clinical Nutrition Pager: [phone] 05/21/2017 1:27 PM

## 2017-05-21 NOTE — Progress Notes (Signed)
  Speech Language Pathology Treatment: Dysphagia  Patient Details Name: Ellen HaverGlenda J Woods MRN: [id number] DOB: 03/29/1941 Today's Date: 05/21/2017 Time: [id number] SLP Time Calculation (min) (ACUTE ONLY): 12 min  Assessment / Plan / Recommendation Clinical Impression  Pt seen at bedside after RD notified SLP that Pt was receiving regular diet textures and not D2. Pt confirms visit with RD and reports modest intake today with no reports of regurgitation. SLP reinforced strategies (frequent, small meals in upright position) and changed diet order to D2 due to suspected esophageal dysmotility. Pt agreeable to change. SLP will sign off at this time. Pt to have outpatient GI f/u for possible achalasia per chart notes. Reconsult if indicated.    HPI HPI: She was admitted on the first of this month with palpitations and swelling.  She was found to have atrial fib with rapid ventricular response and pulmonary edema.  She had TEE cardioversion on 1 8 and has remained in sinus rhythm.  Since that time she has had increasing problems with hypoxic respiratory failure and her CT of the chest shows groundglass opacities.  It is felt that she probably aspirated.  She is been complaining of dysphagia and underwent EGD with dilatation although there was no definite stricture.  Her barium swallow showed that she has diffuse esophageal motility disorder.  There is concerned that she may have some sort of inflammatory lung disease as well as the aspiration and her sedimentation rate was elevated at 102.      SLP Plan  Discharge SLP treatment due to (comment)       Recommendations  Diet recommendations: Dysphagia 2 (fine chop);Thin liquid Liquids provided via: Cup;Straw Medication Administration: Whole meds with liquid Supervision: Patient able to self feed Compensations: Slow rate;Small sips/bites Postural Changes and/or Swallow Maneuvers: Seated upright 90 degrees;Upright 30-60 min after meal                 Oral Care Recommendations: Oral care BID;Staff/trained caregiver to provide oral care Follow up Recommendations: None SLP Visit Diagnosis: Dysphagia, unspecified (R13.10) Plan: Discharge SLP treatment due to (comment)       Thank you,  Havery MorosDabney Liane Tribbey, CCC-SLP [phone]                 Gennaro Lizotte 05/21/2017, 2:55 PM

## 2017-05-22 LAB — BASIC METABOLIC PANEL
Anion gap: 12 (ref 5–15)
BUN: 74 mg/dL — ABNORMAL HIGH (ref 6–20)
CHLORIDE: 100 mmol/L — AB (ref 101–111)
CO2: 26 mmol/L (ref 22–32)
CREATININE: 2.93 mg/dL — AB (ref 0.44–1.00)
Calcium: 6.9 mg/dL — ABNORMAL LOW (ref 8.9–10.3)
GFR calc Af Amer: 17 mL/min — ABNORMAL LOW (ref >60)
GFR calc non Af Amer: 15 mL/min — ABNORMAL LOW (ref >60)
Glucose, Bld: 238 mg/dL — ABNORMAL HIGH (ref 65–99)
Potassium: 3.7 mmol/L (ref 3.5–5.1)
Sodium: 138 mmol/L (ref 135–145)

## 2017-05-22 LAB — GLUCOSE, CAPILLARY
GLUCOSE-CAPILLARY: 271 mg/dL — AB (ref 65–99)
Glucose-Capillary: 373 mg/dL — ABNORMAL HIGH (ref 65–99)
Glucose-Capillary: 192 mg/dL — ABNORMAL HIGH (ref 65–99)
Glucose-Capillary: 204 mg/dL — ABNORMAL HIGH (ref 65–99)

## 2017-05-22 MED ORDER — PIPERACILLIN-TAZOBACTAM 3.375 G IVPB
3.3750 g | Freq: Two times a day (BID) | INTRAVENOUS | Status: DC
  Administered 2017-05-23 (×2): 3.375 g via INTRAVENOUS
  Filled 2017-05-22: qty 50

## 2017-05-22 MED ORDER — APIXABAN 2.5 MG PO TABS
2.5000 mg | ORAL_TABLET | Freq: Two times a day (BID) | ORAL | Status: DC
  Administered 2017-05-22 – 2017-05-25 (×5): 2.5 mg via ORAL
  Filled 2017-05-22 (×5): qty 1

## 2017-05-22 MED ORDER — INSULIN GLARGINE 100 UNIT/ML ~~LOC~~ SOLN
7.0000 [IU] | Freq: Every day | SUBCUTANEOUS | Status: DC
  Administered 2017-05-22 – 2017-05-23 (×2): 7 [IU] via SUBCUTANEOUS
  Filled 2017-05-22 (×4): qty 0.07

## 2017-05-22 MED ORDER — INSULIN ASPART 100 UNIT/ML ~~LOC~~ SOLN
3.0000 [IU] | Freq: Three times a day (TID) | SUBCUTANEOUS | Status: DC
  Administered 2017-05-22 – 2017-05-25 (×9): 3 [IU] via SUBCUTANEOUS

## 2017-05-22 MED ORDER — PREDNISONE 20 MG PO TABS
40.0000 mg | ORAL_TABLET | Freq: Every day | ORAL | Status: DC
  Administered 2017-05-22 – 2017-05-25 (×4): 40 mg via ORAL
  Filled 2017-05-22 (×4): qty 2

## 2017-05-22 NOTE — Care Management Important Message (Signed)
Important Message  Patient Details  Name: Ellen Woods MRN: [id number] Date of Birth: 07/14/1940   Medicare Important Message Given:  Yes    Malcolm MetroChildress, Donyel Nester Demske, RN 05/22/2017, 1:33 PM

## 2017-05-22 NOTE — [place] FL2 (Signed)
[place] MEDICAID FL2 LEVEL OF CARE SCREENING TOOL     IDENTIFICATION  Patient Name: Ellen HaverGlenda J Woods Birthdate: 10/04/1940 Sex: female Admission Date (Current Location): 05/05/2017  [facility] [place]   Facility and Address:  [facility],  [address], [place] [id number]      Provider Number: [id number]  Attending Physician Name and Address:  Cleora FleetJohnson, Clanford L, MD  Relative Name and Phone Number:  Geannie Risenammy Lux [phone]    Current Level of Care: Hospital Recommended Level of Care: Skilled Nursing Facility Prior Approval Number: [id number]  Date Approved/Denied: 11/15/12 PASRR Number:    Discharge Plan: SNF    Current Diagnoses: Patient Active Problem List   Diagnosis Date Noted  . N&V (nausea and vomiting)   . Hypoglycemia 05/10/2017  . Hyperglycemia   . Dysphagia   . Esophageal pain   . Congestive heart failure (HCC)   . Intractable cyclical vomiting with nausea   . Atrial fibrillation with RVR (HCC) 05/05/2017  . Atrial fibrillation, new onset (HCC) 05/05/2017  . Uncontrolled type 2 diabetes mellitus with hyperglycemia (HCC) 03/16/2017  . Depression, recurrent (HCC) 02/26/2017  . Pathological fracture of left femur due to age-related osteoporosis with routine healing 06/06/2015  . Hypokalemia 07/21/2014  . Other dietary vitamin B12 deficiency anemia 07/21/2014  . Other seasonal allergic rhinitis 07/21/2014  . Hypothyroidism 07/21/2014  . Transaminitis   . Diabetes type 2, uncontrolled (HCC)   . Metabolic encephalopathy   . Constipation 03/15/2014  . SIRS (systemic inflammatory response syndrome) (HCC) 03/14/2014  . Chronic diastolic heart failure (HCC) 03/14/2014  . Osteoporosis 10/25/2012  . PAF (paroxysmal atrial fibrillation) (HCC) 03/15/2012  . Carotid bruit 03/15/2012  . Pericardial calcification   . PDA (patent ductus arteriosus)   . Coronary artery disease involving native coronary artery of native heart  with angina pectoris (HCC)   . Mixed hyperlipidemia 03/18/2010  . Essential hypertension 03/18/2010    Orientation RESPIRATION BLADDER Height & Weight     Self, Time, Situation, Place  O2(3L) Continent Weight: 131 lb 6.3 oz (59.6 kg) Height:  4\' 11"  (149.9 cm)  BEHAVIORAL SYMPTOMS/MOOD NEUROLOGICAL BOWEL NUTRITION STATUS      Continent Diet(Dysphagia 2, carb modified, no concentrated sweets or fruit juices except to treat low blood glucose)  AMBULATORY STATUS COMMUNICATION OF NEEDS Skin   Extensive Assist Verbally Normal                       Personal Care Assistance Level of Assistance  Bathing, Feeding, Dressing Bathing Assistance: Limited assistance Feeding assistance: Independent Dressing Assistance: Limited assistance     Functional Limitations Info  Sight, Hearing, Speech Sight Info: Adequate Hearing Info: Adequate Speech Info: Adequate    SPECIAL CARE FACTORS FREQUENCY  PT (By licensed PT)     PT Frequency: 5 times week              Contractures Contractures Info: Not present    Additional Factors Info  Code Status, Allergies Code Status Info: full Allergies Info: codeine, invokana, naproxen, azithromycin           Current Medications (05/22/2017):  This is the current hospital active medication list Current Facility-Administered Medications  Medication Dose Route Frequency Provider Last Rate Last Dose  . 0.9 %  sodium chloride infusion  250 mL Intravenous Continuous Jonelle SidleMcDowell, Samuel G, MD      . acetaminophen (TYLENOL) tablet 650 mg  650 mg Oral Q6H PRN  Leda Gauze, NP   650 mg at 05/09/17 0549  . ALPRAZolam Prudy Feeler) tablet 0.5 mg  0.5 mg Oral BID PRN Filbert Schilder, MD   0.5 mg at 05/21/17 4401  . alum & mag hydroxide-simeth (MAALOX/MYLANTA) 200-200-20 MG/5ML suspension 30 mL  30 mL Oral Q4H PRN Philip Aspen, Limmie Patricia, MD   30 mL at 05/06/17 1301  . amiodarone (PACERONE) tablet 200 mg  200 mg Oral BID Jonelle Sidle, MD    200 mg at 05/22/17 0840  . amLODipine (NORVASC) tablet 2.5 mg  2.5 mg Oral Daily Jonelle Sidle, MD   2.5 mg at 05/22/17 0839  . bisacodyl (DULCOLAX) suppository 10 mg  10 mg Rectal Daily PRN Calvert Cantor, MD      . dextrose 50 % solution 25 mL  25 mL Intravenous Once Johnson, Clanford L, MD      . DULoxetine (CYMBALTA) DR capsule 30 mg  30 mg Oral Daily Filbert Schilder, MD   30 mg at 05/22/17 0840  . feeding supplement (GLUCERNA SHAKE) (GLUCERNA SHAKE) liquid 237 mL  237 mL Oral BID BM Johnson, Clanford L, MD   237 mL at 05/22/17 0841  . HYDROcodone-acetaminophen (NORCO/VICODIN) 5-325 MG per tablet 1 tablet  1 tablet Oral Q6H PRN Kirby-Graham, Beather Arbour, NP      . insulin aspart (novoLOG) injection 0-9 Units  0-9 Units Subcutaneous TID WC Johnson, Clanford L, MD   9 Units at 05/22/17 1149  . insulin aspart (novoLOG) injection 3 Units  3 Units Subcutaneous TID WC Johnson, Clanford L, MD   3 Units at 05/22/17 1149  . insulin glargine (LANTUS) injection 7 Units  7 Units Subcutaneous Daily Laural Benes, Clanford L, MD   7 Units at 05/22/17 1148  . levothyroxine (SYNTHROID, LEVOTHROID) tablet 50 mcg  50 mcg Oral QAC breakfast Filbert Schilder, MD   50 mcg at 05/22/17 0717  . linaclotide (LINZESS) capsule 290 mcg  290 mcg Oral QAC breakfast West Bali, MD   290 mcg at 05/22/17 0717  . metoCLOPramide (REGLAN) injection 5 mg  5 mg Intravenous TID AC Rourk, Gerrit Friends, MD   5 mg at 05/22/17 1149  . metoprolol tartrate (LOPRESSOR) tablet 25 mg  25 mg Oral BID Johnson, Clanford L, MD   25 mg at 05/22/17 0840  . pantoprazole (PROTONIX) EC tablet 40 mg  40 mg Oral BID AC Calvert Cantor, MD   40 mg at 05/22/17 0839  . piperacillin-tazobactam (ZOSYN) 2.25 g in dextrose 5 % 50 mL IVPB  2.25 g Intravenous Q8H Johnson, Clanford L, MD   Stopped at 05/22/17 0600  . predniSONE (DELTASONE) tablet 40 mg  40 mg Oral Q breakfast Kari Baars, MD   40 mg at 05/22/17 0841  . promethazine (PHENERGAN) injection  12.5 mg  12.5 mg Intravenous Q6H PRN Bodenheimer, Charles A, NP   12.5 mg at 05/20/17 1545  . Rivaroxaban (XARELTO) tablet 15 mg  15 mg Oral Q supper Laural Benes, Clanford L, MD   15 mg at 05/21/17 1745  . rosuvastatin (CRESTOR) tablet 5 mg  5 mg Oral Daily Filbert Schilder, MD   5 mg at 05/22/17 0840  . sodium chloride (OCEAN) 0.65 % nasal spray 1 spray  1 spray Each Nare PRN Philip Aspen, Limmie Patricia, MD      . sodium chloride flush (NS) 0.9 % injection 3 mL  3 mL Intravenous Q12H Jonelle Sidle, MD   3 mL at 05/22/17 0841  .  sodium chloride flush (NS) 0.9 % injection 3 mL  3 mL Intravenous PRN Jonelle Sidle, MD   3 mL at 05/20/17 1052     Discharge Medications: Please see discharge summary for a list of discharge medications.  Relevant Imaging Results:  Relevant Lab Results:   Additional Information SSN: [id number] LCSW

## 2017-05-22 NOTE — Discharge Instructions (Addendum)
PREDNISONE INSTRUCTIONS:  40 mg of prednisone daily for 3 MORE DAYS then 30 mg daily for 7 DAYS then 20 mg daily for 7 DAYS and then stop and she should have repeat chest x-ray or CT CHEST DONE.          Information on my medicine - ELIQUIS (apixaban)  This medication education was reviewed with me or my healthcare representative as part of my discharge preparation.   Why was Eliquis prescribed for you? Eliquis was prescribed for you to reduce the risk of a blood clot forming that can cause a stroke if you have a medical condition called atrial fibrillation (a type of irregular heartbeat).  What do You need to know about Eliquis ? Take your Eliquis TWICE DAILY - one tablet in the morning and one tablet in the evening with or without food. If you have difficulty swallowing the tablet whole please discuss with your pharmacist how to take the medication safely.  Take Eliquis exactly as prescribed by your doctor and DO NOT stop taking Eliquis without talking to the doctor who prescribed the medication.  Stopping may increase your risk of developing a stroke.  Refill your prescription before you run out.  After discharge, you should have regular check-up appointments with your healthcare provider that is prescribing your Eliquis.  In the future your dose may need to be changed if your kidney function or weight changes by a significant amount or as you get older.  What do you do if you miss a dose? If you miss a dose, take it as soon as you remember on the same day and resume taking twice daily.  Do not take more than one dose of ELIQUIS at the same time to make up a missed dose.  Important Safety Information A possible side effect of Eliquis is bleeding. You should call your healthcare provider right away if you experience any of the following: ? Bleeding from an injury or your nose that does not stop. ? Unusual colored urine (red or dark brown) or unusual colored stools (red or  black). ? Unusual bruising for unknown reasons. ? A serious fall or if you hit your head (even if there is no bleeding).  Some medicines may interact with Eliquis and might increase your risk of bleeding or clotting while on Eliquis. To help avoid this, consult your healthcare provider or pharmacist prior to using any new prescription or non-prescription medications, including herbals, vitamins, non-steroidal anti-inflammatory drugs (NSAIDs) and supplements.  This website has more information on Eliquis (apixaban): http://www.eliquis.com/eliquis/home

## 2017-05-22 NOTE — Clinical Social Work Note (Signed)
Clinical Social Work Assessment  Patient Details  Name: Ellen Woods MRN: [id number] Date of Birth: 12/14/1940  Date of referral:  05/22/17               Reason for consult:  Facility Placement                Permission sought to share information with:    Permission granted to share information::     Name::        Agency::     Relationship::     Contact Information:     Housing/Transportation Living arrangements for the past 2 months:  Single Family Home Source of Information:  Patient Patient Interpreter Needed:  None Criminal Activity/Legal Involvement Pertinent to Current Situation/Hospitalization:  No - Comment as needed Significant Relationships:  Adult Children Lives with:  Self Do you feel safe going back to the place where you live?  Yes Need for family participation in patient care:  Yes (Comment)  Care giving concerns:  None identified at baseline.    Social Worker assessment / plan:  Patient lives with her daughter. She ambulates independently and completes ADLs independently at baseline. Patient uses a cane when she goes out shopping. She drives. Patient is agreeable to short term SNF.    Employment status:  Retired Database administratornsurance information:  Managed Medicare PT Recommendations:  Skilled Nursing Facility Information / Referral to community resources:  Skilled Nursing Facility  Patient/Family's Response to care:  Patient is agreeable to SNF.   Patient/Family's Understanding of and Emotional Response to Diagnosis, Current Treatment, and Prognosis:  Patient understands her diagnosis, treatment and prognosis and feels she can best be served in short term SNF while she builds her strength.   Emotional Assessment Appearance:  Appears stated age Attitude/Demeanor/Rapport:    Affect (typically observed):  Accepting Orientation:  Oriented to Situation, Oriented to  Time, Oriented to Place, Oriented to Self Alcohol / Substance use:  Not Applicable Psych involvement  (Current and /or in the community):  No (Comment)  Discharge Needs  Concerns to be addressed:  Discharge Planning Concerns Readmission within the last 30 days:  No Current discharge risk:  None Barriers to Discharge:  Insurance Authorization   Annice NeedySettle, [name], LCSW 05/22/2017, 4:06 PM

## 2017-05-22 NOTE — Progress Notes (Signed)
SATURATION QUALIFICATIONS: (This note is used to comply with regulatory documentation for home oxygen)  Patient Saturations on Room Air at Rest = 87%  Patient Saturations on 3 Liters of oxygen = 94%  Please briefly explain why patient needs home oxygen: Patient's oxygen saturation fell to below 90% at rest.

## 2017-05-22 NOTE — Care Management (Signed)
CM aware of change to DC plan. Will update [facility].

## 2017-05-22 NOTE — Clinical Social Work Note (Signed)
[facility] reference number [id number].  Facility cannot accept patient until she receives authorization.    Miklos Bidinger, Juleen ChinaHeather D, LCSW

## 2017-05-22 NOTE — Progress Notes (Signed)
Inpatient Diabetes Program Recommendations  AACE/ADA: New Consensus Statement on Inpatient Glycemic Control (2015)  Target Ranges:  Prepandial:   less than 140 mg/dL      Peak postprandial:   less than 180 mg/dL (1-2 hours)      Critically ill patients:  140 - 180 mg/dL  Results for [name], Arnette J (MRN [id number]) as of 05/22/2017 07:38  Ref. Range 05/22/2017 05:12  Glucose Latest Ref Range: 65 - 99 mg/dL 782238 (H)   Results for [name], Chrissa J (MRN [id number]) as of 05/22/2017 07:38  Ref. Range 05/21/2017 07:46 05/21/2017 12:13 05/21/2017 16:03 05/21/2017 22:22  Glucose-Capillary Latest Ref Range: 65 - 99 mg/dL 578257 (H) 469361 (H) 629341 (H) 202 (H)   Review of Glycemic Control  Outpatient Diabetes medications: Lantus 14 units QAM, Novolog 3-5 units TID with meals Current orders for Inpatient glycemic control:Lantus 7 units daily, Novolog 0-9 units TID with meals, Novolog 2 units TID with meals for meal coverage; Solumedrol 40 mg Q12H   Inpatient Diabetes Program Recommendations: Insulin - Basal: Noted Lantus increased to 7 units today. Correction (SSI): Please consider ordering Novolog 0-5 units QHS for bedtime correction scale. Insulin - Meal Coverage: If steroids are continued, please consider increasing meal coverage to Novolog 3 units TID with meals.  Thanks, Orlando PennerMarie Emmilee Reamer, RN, MSN, CDE Diabetes Coordinator Inpatient Diabetes Program [phone] (Team Pager from 8am to 5pm)

## 2017-05-22 NOTE — Progress Notes (Signed)
Subjective: She says she feels better.  She has no new complaints.  She is coughing some but mostly nonproductively.  She says her breathing feels okay.  Objective: Vital signs in last 24 hours: Temp:  [98.2 F (36.8 C)-98.4 F (36.9 C)] 98.4 F (36.9 C) (01/18 0359) Pulse Rate:  [55-68] 55 (01/18 0359) Resp:  [12-19] 16 (01/18 0359) BP: (82-117)/(36-50) 100/50 (01/18 0359) SpO2:  [94 %-98 %] 94 % (01/18 0359) Weight change:  Last BM Date: 05/21/17  Intake/Output from previous day: 01/17 0701 - 01/18 0700 In: 356 [P.O.:231; IV Piggyback:125] Out: 400 [Urine:400]  PHYSICAL EXAM General appearance: alert, cooperative and no distress Resp: She still has some rales in the bases anteriorly and posterior Cardio: regular rate and rhythm, S1, S2 normal, no murmur, click, rub or gallop GI: soft, non-tender; bowel sounds normal; no masses,  no organomegaly Extremities: extremities normal, atraumatic, no cyanosis or edema Skin warm and dry  Lab Results:  Results for orders placed or performed during the hospital encounter of 05/05/17 (from the past 48 hour(s))  Glucose, capillary     Status: Abnormal   Collection Time: 05/20/17  8:05 AM  Result Value Ref Range   Glucose-Capillary 263 (H) 65 - 99 mg/dL   Comment 1 Notify RN    Comment 2 Document in Chart   Glucose, capillary     Status: Abnormal   Collection Time: 05/20/17 11:59 AM  Result Value Ref Range   Glucose-Capillary 215 (H) 65 - 99 mg/dL   Comment 1 Notify RN    Comment 2 Document in Chart   Glucose, capillary     Status: Abnormal   Collection Time: 05/20/17  4:39 PM  Result Value Ref Range   Glucose-Capillary 233 (H) 65 - 99 mg/dL   Comment 1 Notify RN    Comment 2 Document in Chart   Glucose, capillary     Status: Abnormal   Collection Time: 05/20/17  9:20 PM  Result Value Ref Range   Glucose-Capillary 161 (H) 65 - 99 mg/dL   Comment 1 Notify RN    Comment 2 Document in Chart   CBC     Status: Abnormal   Collection Time: 05/21/17  5:04 AM  Result Value Ref Range   WBC 11.0 (H) 4.0 - 10.5 K/uL   RBC 3.43 (L) 3.87 - 5.11 MIL/uL   Hemoglobin 10.7 (L) 12.0 - 15.0 g/dL   HCT 33.4 (L) 36.0 - 46.0 %   MCV 97.4 78.0 - 100.0 fL   MCH 31.2 26.0 - 34.0 pg   MCHC 32.0 30.0 - 36.0 g/dL   RDW 13.8 11.5 - 15.5 %   Platelets 328 150 - 400 K/uL  Basic metabolic panel     Status: Abnormal   Collection Time: 05/21/17  5:04 AM  Result Value Ref Range   Sodium 138 135 - 145 mmol/L   Potassium 3.7 3.5 - 5.1 mmol/L    Comment: DELTA CHECK NOTED   Chloride 95 (L) 101 - 111 mmol/L   CO2 29 22 - 32 mmol/L   Glucose, Bld 240 (H) 65 - 99 mg/dL   BUN 55 (H) 6 - 20 mg/dL   Creatinine, Ser 2.33 (H) 0.44 - 1.00 mg/dL   Calcium 7.3 (L) 8.9 - 10.3 mg/dL   GFR calc non Af Amer 19 (L) >60 mL/min   GFR calc Af Amer 22 (L) >60 mL/min    Comment: (NOTE) The eGFR has been calculated using the CKD EPI equation. This  calculation has not been validated in all clinical situations. eGFR's persistently <60 mL/min signify possible Chronic Kidney Disease.    Anion gap 14 5 - 15  Magnesium     Status: None   Collection Time: 05/21/17  5:04 AM  Result Value Ref Range   Magnesium 1.9 1.7 - 2.4 mg/dL  Glucose, capillary     Status: Abnormal   Collection Time: 05/21/17  7:46 AM  Result Value Ref Range   Glucose-Capillary 257 (H) 65 - 99 mg/dL   Comment 1 Notify RN    Comment 2 Document in Chart   Glucose, capillary     Status: Abnormal   Collection Time: 05/21/17 12:13 PM  Result Value Ref Range   Glucose-Capillary 361 (H) 65 - 99 mg/dL   Comment 1 Notify RN    Comment 2 Document in Chart   Glucose, capillary     Status: Abnormal   Collection Time: 05/21/17  4:03 PM  Result Value Ref Range   Glucose-Capillary 341 (H) 65 - 99 mg/dL   Comment 1 Notify RN    Comment 2 Document in Chart   Glucose, capillary     Status: Abnormal   Collection Time: 05/21/17 10:22 PM  Result Value Ref Range   Glucose-Capillary 202 (H)  65 - 99 mg/dL  Basic metabolic panel     Status: Abnormal   Collection Time: 05/22/17  5:12 AM  Result Value Ref Range   Sodium 138 135 - 145 mmol/L   Potassium 3.7 3.5 - 5.1 mmol/L   Chloride 100 (L) 101 - 111 mmol/L   CO2 26 22 - 32 mmol/L   Glucose, Bld 238 (H) 65 - 99 mg/dL   BUN 74 (H) 6 - 20 mg/dL   Creatinine, Ser 2.93 (H) 0.44 - 1.00 mg/dL   Calcium 6.9 (L) 8.9 - 10.3 mg/dL   GFR calc non Af Amer 15 (L) >60 mL/min   GFR calc Af Amer 17 (L) >60 mL/min    Comment: (NOTE) The eGFR has been calculated using the CKD EPI equation. This calculation has not been validated in all clinical situations. eGFR's persistently <60 mL/min signify possible Chronic Kidney Disease.    Anion gap 12 5 - 15  Glucose, capillary     Status: Abnormal   Collection Time: 05/22/17  7:49 AM  Result Value Ref Range   Glucose-Capillary 271 (H) 65 - 99 mg/dL    ABGS No results for input(s): PHART, PO2ART, TCO2, HCO3 in the last 72 hours.  Invalid input(s): PCO2 CULTURES Recent Results (from the past 240 hour(s))  Respiratory Panel by PCR     Status: None   Collection Time: 05/18/17 10:12 AM  Result Value Ref Range Status   Adenovirus NOT DETECTED NOT DETECTED Final   Coronavirus 229E NOT DETECTED NOT DETECTED Final   Coronavirus HKU1 NOT DETECTED NOT DETECTED Final   Coronavirus NL63 NOT DETECTED NOT DETECTED Final   Coronavirus OC43 NOT DETECTED NOT DETECTED Final   Metapneumovirus NOT DETECTED NOT DETECTED Final   Rhinovirus / Enterovirus NOT DETECTED NOT DETECTED Final   Influenza A NOT DETECTED NOT DETECTED Final   Influenza B NOT DETECTED NOT DETECTED Final   Parainfluenza Virus 1 NOT DETECTED NOT DETECTED Final   Parainfluenza Virus 2 NOT DETECTED NOT DETECTED Final   Parainfluenza Virus 3 NOT DETECTED NOT DETECTED Final   Parainfluenza Virus 4 NOT DETECTED NOT DETECTED Final   Respiratory Syncytial Virus NOT DETECTED NOT DETECTED Final   Bordetella pertussis  NOT DETECTED NOT DETECTED  Final   Chlamydophila pneumoniae NOT DETECTED NOT DETECTED Final   Mycoplasma pneumoniae NOT DETECTED NOT DETECTED Final  Gram stain     Status: None   Collection Time: 05/18/17 10:50 AM  Result Value Ref Range Status   Specimen Description PLEURAL  Final   Special Requests NONE  Final   Gram Stain   Final    NO ORGANISMS SEEN WBC PRESENT,BOTH PMN AND MONONUCLEAR CYTOSPIN SMEAR    Report Status 05/18/2017 FINAL  Final   Studies/Results: Dg Chest Port 1 View  Result Date: 05/21/2017 CLINICAL DATA:  Acute on chronic respiratory failure EXAM: PORTABLE CHEST 1 VIEW COMPARISON:  05/18/2017 FINDINGS: Diffuse bilateral airspace disease unchanged. Small bilateral effusions and bibasilar atelectasis unchanged. Cardiac enlargement. IMPRESSION: Diffuse bilateral airspace disease and small bilateral effusions unchanged. Electronically Signed   By: Franchot Gallo M.D.   On: 05/21/2017 08:59    Medications:  Prior to Admission:  Medications Prior to Admission  Medication Sig Dispense Refill Last Dose  . acetaminophen (TYLENOL) 325 MG tablet Take 2 tablets (650 mg total) by mouth every 6 (six) hours as needed.   unknown  . ALPRAZolam (XANAX) 0.5 MG tablet Take 1 tablet (0.5 mg total) by mouth 2 (two) times daily as needed. 160 tablet 0 05/05/2017 at Unknown time  . amLODipine (NORVASC) 5 MG tablet Take 1 tablet (5 mg total) daily by mouth. For blood pressure 90 tablet 1 05/04/2017 at Unknown time  . aspirin 81 MG tablet Take 81 mg by mouth daily.   unknown  . Calcium Carb-Cholecalciferol (CALCIUM 500 + D3) 500-600 MG-UNIT TABS Take 1 tablet by mouth daily. 60 tablet  05/04/2017 at Unknown time  . DULoxetine (CYMBALTA) 30 MG capsule Take 1 capsule (30 mg total) by mouth daily. 90 capsule 1 05/04/2017 at Unknown time  . furosemide (LASIX) 40 MG tablet 20 mg. Take one tablet by mouth as needed   05/05/2017 at Unknown time  . Insulin Glargine (LANTUS SOLOSTAR) 100 UNIT/ML Solostar Pen Inject 15 Units into  the skin daily with breakfast. (Patient taking differently: Inject 14 Units into the skin daily with breakfast. ) 15 mL 2 05/05/2017 at Unknown time  . KLOR-CON M20 20 MEQ tablet TAKE 1 TABLET EVERY DAY 90 tablet 1 05/04/2017 at Unknown time  . levothyroxine (SYNTHROID, LEVOTHROID) 50 MCG tablet TAKE 1 TABLET EVERY DAY BEFORE BREAKFAST 90 tablet 1 05/05/2017 at Unknown time  . lisinopril (PRINIVIL,ZESTRIL) 40 MG tablet Take 1 tablet (40 mg total) by mouth daily. 30 tablet 6 05/05/2017 at Unknown time  . metoprolol tartrate (LOPRESSOR) 25 MG tablet TAKE 1 TABLET TWICE DAILY 180 tablet 1 05/05/2017 at 0900  . NITROSTAT 0.4 MG SL tablet DISSOLVE  1 TABLET  UNDER THE TONGUE EVERY 5 MINUTES AS NEEDED FOR CHEST PAIN 25 tablet 0 unknown  . NOVOLOG FLEXPEN 100 UNIT/ML FlexPen INJECT 3 TO 5 UNITS SUBCUTANEOUSLY THREE TIMES DAILY WITH MEALS 15 mL 3 05/05/2017 at Unknown time  . PROLIA 60 MG/ML SOLN injection Inject 60 mg into the skin every 6 (six) months. To be administered 08/20/2016 1 each 0 unkmown  . rosuvastatin (CRESTOR) 5 MG tablet Take 1 tablet (5 mg total) daily by mouth. 90 tablet 1 05/05/2017 at Unknown time   Scheduled: . amiodarone  200 mg Oral BID  . amLODipine  2.5 mg Oral Daily  . dextrose  25 mL Intravenous Once  . DULoxetine  30 mg Oral Daily  . feeding supplement (GLUCERNA  SHAKE)  237 mL Oral BID BM  . insulin aspart  0-9 Units Subcutaneous TID WC  . insulin aspart  2 Units Subcutaneous TID WC  . insulin glargine  7 Units Subcutaneous Daily  . levothyroxine  50 mcg Oral QAC breakfast  . linaclotide  290 mcg Oral QAC breakfast  . methylPREDNISolone (SOLU-MEDROL) injection  40 mg Intravenous Q12H  . metoCLOPramide (REGLAN) injection  5 mg Intravenous TID AC  . metoprolol tartrate  25 mg Oral BID  . pantoprazole  40 mg Oral BID AC  . rivaroxaban  15 mg Oral Q supper  . rosuvastatin  5 mg Oral Daily  . sodium chloride flush  3 mL Intravenous Q12H   Continuous: . sodium chloride    .  piperacillin-tazobactam (ZOSYN)  IV Stopped (05/22/17 0600)   GNF:AOZHYQMVHQION, ALPRAZolam, alum & mag hydroxide-simeth, bisacodyl, HYDROcodone-acetaminophen, promethazine, sodium chloride, sodium chloride flush  Assesment: She came to the hospital with increasing shortness of breath and was found to have atrial fib with rapid ventricular response and congestive heart failure.  She has been treated and eventually underwent cardioversion for her atrial fib and has remained in sinus rhythm.  Her heart failure seems better  She has abnormal chest CT with diffuse groundglass abnormalities which could be still from heart failure, could be from aspiration pneumonia which I believe she has but also could be from inflammatory lung disease and she does have sedimentation rate of 102.  She is being treated for inflammatory lung disease with steroids and I think we could switch her to p.o. now.  She is on amiodarone and if her CT does not improve with steroids that will need to be evaluated again she is still on Zosyn for aspiration. Principal Problem:   Atrial fibrillation with RVR (HCC) Active Problems:   Mixed hyperlipidemia   Essential hypertension   Coronary artery disease involving native coronary artery of native heart with angina pectoris (HCC)   Chronic diastolic heart failure (HCC)   Diabetes type 2, uncontrolled ([place])   Hypothyroidism   Uncontrolled type 2 diabetes mellitus with hyperglycemia (HCC)   Atrial fibrillation, new onset (HCC)   Congestive heart failure (HCC)   Intractable cyclical vomiting with nausea   Dysphagia   Esophageal pain   Hyperglycemia   Hypoglycemia   N&V (nausea and vomiting)    Plan: Continue current treatments.  Switch to p.o. prednisone    LOS: 17 days   Davionne Mastrangelo L 05/22/2017, 8:04 AM

## 2017-05-22 NOTE — Progress Notes (Signed)
PROGRESS NOTE    Ellen Woods   ZOX:[id number]  DOB: Apr 11, 1941  DOA: 05/05/2017 PCP: Dettinger, Elige Radon, MD   Brief Narrative:  Ellen Woods is a 77 y.o. female with medical history significant of COPD, coronary artery disease, diabetes mellitus type 2, dyspnea on exertion who presented to the emergency department with palpitations and facial swelling. In the ER she was found to have A-fib with RVR and pulmonary edema. She has had a prolonged hospital stay. She underwent cardioversion on 1/8 and has been in NSR since.  She developed acute respiratory failure which we are still trying to resolve- see below.  She had complaints of dysphagia, nausea which were evaluated with an EGD and an esophagram.  Subjective: Pt describes being weak, she has agreed to go to acute rehab.  She is sitting up in chair, she says she is motivated to participate in rehab.   Assessment & Plan:   Atrial fibrillation with RVR - underwent TEE cardioversion on 1/8 - CHA2DS2-VASc Score 5 - being managed by cardiology with Amiodarone and Lopressor - has been on Coumadin but was put on hold for EGD -  on NOAC now - HR has been controlled and stable.  Cardioversion was successful.   Nausea/ vomiting- esophageal stricture - occasional dysphagia and regurgitation of food per daughter  - upper GI series shows severe impairment of esophageal motility, a narrowing at the GE junction, a hiatal hernia and possible GOO vs gastroparesis - she needed esophageal dilation about 15 yrs ago  - 1/11- EGD did not reveal any stricture- GI junction was "empirically dilated"  In case she has achalasia - her regurgitation symptoms are likely due to severe esophageal dysmotility and a hiatal hernia- she continues to have episodes of regurgitation and vomiting   GI is recommending outpatient manometry study.    Dyspnea/ acute resp distress- due to acute d CHF and or b/l lobar pneumonia - 1/9-  RR is in 20s  on my exam and there are crackles at bases -  - per patient, she has had a cough for about 1 wk - CXR >>- is not clear on whether this is pneumonia or CHF  - started on lasix and Levaquin  - 1/10- mild improvement noted- she states cough is gone but she is still hypoxic- cont to diurese and treat with Levaquin and attempt to wean O2 1/11- symptoms appear to be improving- she is not dyspneic today- wean O2- cont diuretics and antibiotics 1/12- repeat CXR shows bilateral multifocal pneumonia which is quite extensive in the right lung base- ? If she aspirated in relation to above esophageal dysmotility- will change Levaquin to Zosyn today- she still has extensive crackles on exam and is still requiring O2- lasix held  - 1/13- cont current management with anibiotics- wean O2 as able - 1/14- continues to be hypoxic- WBC improving- have obtained a CT of the chest which reveals a mod-large right sided pleural effusion which was not seen on the CXR and multiple infiltrates- difficult to tell if this is CHF or an atypical infection or autoimmune - s/p thoracentesis ...530 cc of yellow fluid obtained- pleural fluid results reveal a transudate and lasix has been resumed - 1/14- given a dose of Levaquin in case we are missing an atypical organism with Zosyn -- cont to follow- have asked for pulmonary consult  - continue antibiotics  Hypotension - Pt was given bolus of IVFs and BP has improved.  DC'd lasix.  Severe constipation  - on Linzess -  not much stool output after suppositories- she has not had much to eat in the hospital and may not have much stool to pass- follow    Chronic diastolic heart failure  - see above -pleural fluid looks like transudate - Lasix resumed- follow I and O and daily weights  CKD 3-4   - stable, following.   Hypokalemia - ordered replacement, follow.  Check magnesium.    Brittle, insulin dependent Diabetes type 2 - continue to monitor closely as her diet intake is  fluctuating, high risk for hypoglycemia CBG (last 3)  Recent Labs    05/21/17 2222 05/22/17 0749 05/22/17 1110  GLUCAP 202* 271* 373*     Hypothyroidism - continue home dose Synthroid    DVT prophylaxis: SCDs Code Status: Full code Family Communication: daughter Disposition Plan:  Consultants:   Cardiology  GI Procedures:  2 D ECHO Left ventricle: The cavity size was normal. Wall thickness was  normal. Systolic function was normal. The estimated ejection   fraction was in the range of 60% to 65%. Wall motion was normal;  there were no regional wall motion abnormalities. - Aortic valve: Mildly calcified annulus. Trileaflet; normal thickness leaflets. Valve area (VTI): 1.87 cm^2. - Mitral valve: There was mild regurgitation. - Left atrium: The atrium was moderately dilated. - Right atrium: The atrium was mildly dilated. - Atrial septum: No defect or patent foramen ovale was identified. - Pulmonary arteries: Systolic pressure was mildly to moderately  increased. PA peak pressure: 40 mm Hg (S).  TEE cardioversion  Antimicrobials:  Anti-infectives (From admission, onward)   Start     Dose/Rate Route Frequency Ordered Stop   05/21/17 2200  piperacillin-tazobactam (ZOSYN) IVPB 2.25 g  Status:  Discontinued     2.25 g 100 mL/hr over 30 Minutes Intravenous Every 8 hours 05/21/17 1437 05/21/17 1502   05/21/17 2200  piperacillin-tazobactam (ZOSYN) 2.25 g in dextrose 5 % 50 mL IVPB     2.25 g 100 mL/hr over 30 Minutes Intravenous Every 8 hours 05/21/17 1502     05/19/17 1300  doxycycline (VIBRAMYCIN) 100 mg in dextrose 5 % 250 mL IVPB  Status:  Discontinued     100 mg 125 mL/hr over 120 Minutes Intravenous Every 12 hours 05/19/17 1254 05/20/17 1520   05/18/17 0815  levofloxacin (LEVAQUIN) IVPB 500 mg  Status:  Discontinued     500 mg 100 mL/hr over 60 Minutes Intravenous Every 48 hours 05/18/17 0748 05/18/17 1048   05/16/17 1030  piperacillin-tazobactam (ZOSYN) IVPB 3.375 g   Status:  Discontinued     3.375 g 12.5 mL/hr over 240 Minutes Intravenous Every 8 hours 05/16/17 1008 05/21/17 1437   05/15/17 1800  levofloxacin (LEVAQUIN) IVPB 500 mg  Status:  Discontinued     500 mg 100 mL/hr over 60 Minutes Intravenous Every 48 hours 05/15/17 1204 05/16/17 0941   05/13/17 1800  levofloxacin (LEVAQUIN) IVPB 750 mg  Status:  Discontinued     750 mg 100 mL/hr over 90 Minutes Intravenous Every 48 hours 05/13/17 1610 05/15/17 1204     Objective: Vitals:   05/21/17 2114 05/21/17 2222 05/22/17 0359 05/22/17 0839  BP:  (!) 91/46 (!) 100/50   Pulse:  66 (!) 55 68  Resp:  16 16   Temp:  98.2 F (36.8 C) 98.4 F (36.9 C)   TempSrc:  Oral Oral   SpO2: 95% 95% 94%   Weight:      Height:  Intake/Output Summary (Last 24 hours) at 05/22/2017 1121 Last data filed at 05/22/2017 0900 Gross per 24 hour  Intake 186 ml  Output 550 ml  Net -364 ml   Filed Weights   05/19/17 0511 05/20/17 0521 05/21/17 0603  Weight: 59.8 kg (131 lb 13.4 oz) 59.3 kg (130 lb 11.7 oz) 59.6 kg (131 lb 6.3 oz)   Examination: General exam: Appears comfortable, sitting up in chair.  NAD.  HEENT: PERRLA, oral mucosa moist, no sclera icterus or thrush Respiratory system: BBS fairly clear.  Cardiovascular system: S1 & S2 heard.  No murmurs, rubs, gallops.  Gastrointestinal system: Abdomen soft, non-tender, nondistended. Normal bowel sound. No organomegaly. Central nervous system: Alert and oriented. No focal neurological deficits. Extremities: No cyanosis, clubbing or edema. Skin: No rashes or ulcers. Psychiatry:  Mood & affect appropriate.   Data Reviewed: I have personally reviewed following labs and imaging studies  CBC: Recent Labs  Lab 05/17/17 0551 05/18/17 0446 05/19/17 0443 05/20/17 0622 05/21/17 0504  WBC 10.7* 12.9* 11.5* 10.1 11.0*  HGB 10.7* 10.7* 9.9* 10.4* 10.7*  HCT 34.8* 35.0* 32.4* 33.2* 33.4*  MCV 100.6* 100.0 99.1 99.1 97.4  PLT 310 333 312 294 328   Basic  Metabolic Panel: Recent Labs  Lab 05/17/17 1230 05/18/17 0446 05/19/17 0443 05/20/17 0622 05/21/17 0504 05/22/17 0512  NA  --  144 141 139 138 138  K  --  4.2 3.3* 3.0* 3.7 3.7  CL  --  100* 99* 95* 95* 100*  CO2  --  32 30 29 29 26   GLUCOSE  --  184* 159* 255* 240* 238*  BUN  --  51* 48* 45* 55* 74*  CREATININE  --  1.83* 1.79* 1.99* 2.33* 2.93*  CALCIUM  --  7.7* 7.2* 7.1* 7.3* 6.9*  MG 2.2 2.0  --   --  1.9  --    GFR: Estimated Creatinine Clearance: 12.8 mL/min (A) (by C-G formula based on SCr of 2.93 mg/dL (H)). Liver Function Tests: No results for input(s): AST, ALT, ALKPHOS, BILITOT, PROT, ALBUMIN in the last 168 hours. No results for input(s): LIPASE, AMYLASE in the last 168 hours. No results for input(s): AMMONIA in the last 168 hours. Coagulation Profile: Recent Labs  Lab 05/16/17 0555 05/17/17 0511  INR 2.68 3.27   Cardiac Enzymes: No results for input(s): CKTOTAL, CKMB, CKMBINDEX, TROPONINI in the last 168 hours. BNP (last 3 results) No results for input(s): PROBNP in the last 8760 hours. HbA1C: No results for input(s): HGBA1C in the last 72 hours. CBG: Recent Labs  Lab 05/21/17 1213 05/21/17 1603 05/21/17 2222 05/22/17 0749 05/22/17 1110  GLUCAP 361* 341* 202* 271* 373*   Lipid Profile: No results for input(s): CHOL, HDL, LDLCALC, TRIG, CHOLHDL, LDLDIRECT in the last 72 hours. Thyroid Function Tests: No results for input(s): TSH, T4TOTAL, FREET4, T3FREE, THYROIDAB in the last 72 hours. Anemia Panel: No results for input(s): VITAMINB12, FOLATE, FERRITIN, TIBC, IRON, RETICCTPCT in the last 72 hours. Urine analysis:    Component Value Date/Time   COLORURINE YELLOW 03/14/2014 1227   APPEARANCEUR CLEAR 03/14/2014 1227   LABSPEC 1.015 03/14/2014 1227   PHURINE 5.0 03/14/2014 1227   GLUCOSEU 100 (A) 03/14/2014 1227   HGBUR NEGATIVE 03/14/2014 1227   BILIRUBINUR neg 06/13/2015 1710   KETONESUR NEGATIVE 03/14/2014 1227   PROTEINUR neg 06/13/2015  1710   PROTEINUR NEGATIVE 03/14/2014 1227   UROBILINOGEN negative 06/13/2015 1710   UROBILINOGEN 0.2 03/14/2014 1227   NITRITE neg 06/13/2015 1710  NITRITE NEGATIVE 03/14/2014 1227   LEUKOCYTESUR Negative 06/13/2015 1710   Recent Results (from the past 240 hour(s))  Respiratory Panel by PCR     Status: None   Collection Time: 05/18/17 10:12 AM  Result Value Ref Range Status   Adenovirus NOT DETECTED NOT DETECTED Final   Coronavirus 229E NOT DETECTED NOT DETECTED Final   Coronavirus HKU1 NOT DETECTED NOT DETECTED Final   Coronavirus NL63 NOT DETECTED NOT DETECTED Final   Coronavirus OC43 NOT DETECTED NOT DETECTED Final   Metapneumovirus NOT DETECTED NOT DETECTED Final   Rhinovirus / Enterovirus NOT DETECTED NOT DETECTED Final   Influenza A NOT DETECTED NOT DETECTED Final   Influenza B NOT DETECTED NOT DETECTED Final   Parainfluenza Virus 1 NOT DETECTED NOT DETECTED Final   Parainfluenza Virus 2 NOT DETECTED NOT DETECTED Final   Parainfluenza Virus 3 NOT DETECTED NOT DETECTED Final   Parainfluenza Virus 4 NOT DETECTED NOT DETECTED Final   Respiratory Syncytial Virus NOT DETECTED NOT DETECTED Final   Bordetella pertussis NOT DETECTED NOT DETECTED Final   Chlamydophila pneumoniae NOT DETECTED NOT DETECTED Final   Mycoplasma pneumoniae NOT DETECTED NOT DETECTED Final  Gram stain     Status: None   Collection Time: 05/18/17 10:50 AM  Result Value Ref Range Status   Specimen Description PLEURAL  Final   Special Requests NONE  Final   Gram Stain   Final    NO ORGANISMS SEEN WBC PRESENT,BOTH PMN AND MONONUCLEAR CYTOSPIN SMEAR    Report Status 05/18/2017 FINAL  Final    Radiology Studies: Dg Chest Port 1 View  Result Date: 05/21/2017 CLINICAL DATA:  Acute on chronic respiratory failure EXAM: PORTABLE CHEST 1 VIEW COMPARISON:  05/18/2017 FINDINGS: Diffuse bilateral airspace disease unchanged. Small bilateral effusions and bibasilar atelectasis unchanged. Cardiac enlargement.  IMPRESSION: Diffuse bilateral airspace disease and small bilateral effusions unchanged. Electronically Signed   By: Marlan Palauharles  Clark M.D.   On: 05/21/2017 08:59   Scheduled Meds: . amiodarone  200 mg Oral BID  . amLODipine  2.5 mg Oral Daily  . dextrose  25 mL Intravenous Once  . DULoxetine  30 mg Oral Daily  . feeding supplement (GLUCERNA SHAKE)  237 mL Oral BID BM  . insulin aspart  0-9 Units Subcutaneous TID WC  . insulin aspart  3 Units Subcutaneous TID WC  . insulin glargine  7 Units Subcutaneous Daily  . levothyroxine  50 mcg Oral QAC breakfast  . linaclotide  290 mcg Oral QAC breakfast  . metoCLOPramide (REGLAN) injection  5 mg Intravenous TID AC  . metoprolol tartrate  25 mg Oral BID  . pantoprazole  40 mg Oral BID AC  . predniSONE  40 mg Oral Q breakfast  . rivaroxaban  15 mg Oral Q supper  . rosuvastatin  5 mg Oral Daily  . sodium chloride flush  3 mL Intravenous Q12H   Continuous Infusions: . sodium chloride    . piperacillin-tazobactam (ZOSYN)  IV Stopped (05/22/17 0600)    LOS: 17 days   Standley Dakinslanford Johnson, MD [facility] Pager: [phone] [email or web address] Password [email or web address] 05/22/2017, 11:21 AM

## 2017-05-22 NOTE — Progress Notes (Signed)
ANTICOAGULATION CONSULT NOTE - Initial Consult  Pharmacy Consult for Apixaban Indication: atrial fibrillation  Allergies  Allergen Reactions  . Codeine     REACTION: nausea  . Invokana [Canagliflozin]   . Naproxen Other (See Comments)    Abdominal pain  . Azithromycin Other (See Comments)    Hospital reaction    Patient Measurements: Height: 4\' 11"  (149.9 cm) Weight: 131 lb 6.3 oz (59.6 kg) IBW/kg (Calculated) : 43.2   Vital Signs: Temp: 98.4 F (36.9 C) (01/18 0359) Temp Source: Oral (01/18 0359) BP: 100/50 (01/18 0359) Pulse Rate: 68 (01/18 0839)  Labs: Recent Labs    05/20/17 0622 05/21/17 0504 05/22/17 0512  HGB 10.4* 10.7*  --   HCT 33.2* 33.4*  --   PLT 294 328  --   CREATININE 1.99* 2.33* 2.93*    Estimated Creatinine Clearance: 12.8 mL/min (A) (by C-G formula based on SCr of 2.93 mg/dL (H)).   Medical History: Past Medical History:  Diagnosis Date  . Arthritis   . Back pain 1/14   Tx by orthopedics  . COPD (chronic obstructive pulmonary disease) (HCC)   . Coronary atherosclerosis of native coronary artery    50% LAD stenosis  . Encounter for long-term (current) use of insulin (HCC)   . Family history of anesthesia complication    Daughter has nausea  . PDA (patent ductus arteriosus)    Small PDA documented by CT angiography, no pulmonary hypertension  . Pericardial calcification    Ruled out for restrictive pericarditis  . Thyroid disease   . Type II or unspecified type diabetes mellitus without mention of complication, not stated as uncontrolled   . Unspecified essential hypertension     Medications:  Medications Prior to Admission  Medication Sig Dispense Refill Last Dose  . acetaminophen (TYLENOL) 325 MG tablet Take 2 tablets (650 mg total) by mouth every 6 (six) hours as needed.   unknown  . ALPRAZolam (XANAX) 0.5 MG tablet Take 1 tablet (0.5 mg total) by mouth 2 (two) times daily as needed. 160 tablet 0 05/05/2017 at Unknown time  .  amLODipine (NORVASC) 5 MG tablet Take 1 tablet (5 mg total) daily by mouth. For blood pressure 90 tablet 1 05/04/2017 at Unknown time  . aspirin 81 MG tablet Take 81 mg by mouth daily.   unknown  . Calcium Carb-Cholecalciferol (CALCIUM 500 + D3) 500-600 MG-UNIT TABS Take 1 tablet by mouth daily. 60 tablet  05/04/2017 at Unknown time  . DULoxetine (CYMBALTA) 30 MG capsule Take 1 capsule (30 mg total) by mouth daily. 90 capsule 1 05/04/2017 at Unknown time  . furosemide (LASIX) 40 MG tablet 20 mg. Take one tablet by mouth as needed   05/05/2017 at Unknown time  . Insulin Glargine (LANTUS SOLOSTAR) 100 UNIT/ML Solostar Pen Inject 15 Units into the skin daily with breakfast. (Patient taking differently: Inject 14 Units into the skin daily with breakfast. ) 15 mL 2 05/05/2017 at Unknown time  . KLOR-CON M20 20 MEQ tablet TAKE 1 TABLET EVERY DAY 90 tablet 1 05/04/2017 at Unknown time  . levothyroxine (SYNTHROID, LEVOTHROID) 50 MCG tablet TAKE 1 TABLET EVERY DAY BEFORE BREAKFAST 90 tablet 1 05/05/2017 at Unknown time  . lisinopril (PRINIVIL,ZESTRIL) 40 MG tablet Take 1 tablet (40 mg total) by mouth daily. 30 tablet 6 05/05/2017 at Unknown time  . metoprolol tartrate (LOPRESSOR) 25 MG tablet TAKE 1 TABLET TWICE DAILY 180 tablet 1 05/05/2017 at 0900  . NITROSTAT 0.4 MG SL tablet DISSOLVE  1  TABLET  UNDER THE TONGUE EVERY 5 MINUTES AS NEEDED FOR CHEST PAIN 25 tablet 0 unknown  . NOVOLOG FLEXPEN 100 UNIT/ML FlexPen INJECT 3 TO 5 UNITS SUBCUTANEOUSLY THREE TIMES DAILY WITH MEALS 15 mL 3 05/05/2017 at Unknown time  . PROLIA 60 MG/ML SOLN injection Inject 60 mg into the skin every 6 (six) months. To be administered 08/20/2016 1 each 0 unkmown  . rosuvastatin (CRESTOR) 5 MG tablet Take 1 tablet (5 mg total) daily by mouth. 90 tablet 1 05/05/2017 at Unknown time    Assessment: Changing xarelto to apixaban due to decreased renal function with current CrCl of 12.8.   Goal of Therapy:   Monitor platelets by anticoagulation  protocol: Yes   Plan:  Apixaban 2.5 mg twice daily. Patient  Scr 2.93 and weight less than 60 kg.  Continue to monitor renal function and s/s of bleeding  Tad Moore 05/22/2017,12:44 PM

## 2017-05-22 NOTE — Clinical Social Work Note (Signed)
LCSW contacted by MD indicating pt will need SNF rehab at dc due to weakness from prolonged hospitalization. Spoke with Fayrene FearingJames from PT who will re-evaluate pt after lunch. Pt informed SW that she is agreeable to SNF rehab and [facility], and [facility]. Referrals started. Per MD, pt may be stable for dc over the weekend but more likely Monday.   LCSW Tretha SciaraHeather Settle will follow and continue to assist with pt's dc needs.

## 2017-05-22 NOTE — Progress Notes (Signed)
Physical Therapy Treatment Patient Details Name: Ellen Woods MRN: [id number] DOB: 06/04/1940 Today's Date: 05/22/2017    History of Present Illness Ellen Woods is a 77 y.o. female with medical history significant of COPD, coronary artery disease, diabetes mellitus type 2, dyspnea on exertion who presented to the emergency department with palpitations and facial swelling.  Patient's daughter and son are bedside and states that this morning patient began to have swelling on one side of her face and it appeared as though her heart was "beating out of her chest".  They used an at home blood pressure monitor and stated her blood pressure was reasonably controlled but her heart rate was in the 150s.  They gave patient a Xanax and stated that her heart rate came down and the swelling improved.  She presented to the emergency department for further evaluation of her tachycardia.  Patient reports that she has chronic dyspnea on exertion and has told this to her cardiologist who she saw in October 2018 but states that no further workup was ordered for her.  She does mention that she has a history of coronary artery disease with upon reviewing the note from the cardiologist in October she had a negative Lexiscan Myoview.  At that time her ejection fraction was noted to be 78%.  Patient reports no recent sick contacts except for her son and daughter who had gastroenteritis.  She voices that she occasionally has vomiting but this she attributes to her esophagus requiring stretching which she states she had done at least [place] years ago in [place].  She denies fevers or chills, denies diarrhea or constipation, and denies chest pain.  She states that she thinks her palpitations began last night before she went to bed but she did not measure her heart rate at that time.  According to her problem list patient has a history of paroxysmal atrial fibrillation from 2013 however she cannot recall this incident.  There is a  cardiology note from June 2017 that voices that she had a Holter monitor that demonstrated premature atrial contractions and atrial tachycardia.  She does have a history of a PDA which is small and has been managed medically.    PT Comments    Patient presents up in chair and agreeable for therapy, has most difficulty with sit to stands due to BLE weakness, demonstrates slightly unsteady labored cadence without loss of balance, limited secondary to c/o fatigue and tolerated staying up in chair after therapy.  Patient will benefit from continued physical therapy in hospital and recommended venue below to increase strength, balance, endurance for safe ADLs and gait.    Follow Up Recommendations  SNF;Supervision/Assistance - 24 hour     Equipment Recommendations  None recommended by PT    Recommendations for Other Services       Precautions / Restrictions Precautions Precautions: Fall Restrictions Weight Bearing Restrictions: No    Mobility  Bed Mobility               General bed mobility comments: Patient present up in chair (assisted by nsg staff  Transfers Overall transfer level: Needs assistance Equipment used: Rolling walker (2 wheeled) Transfers: Sit to/from [employer]Stand;Stand Pivot Transfers Sit to Stand: Min assist Stand pivot transfers: Min guard       General transfer comment: requires verbal cues for proper hand placement for sit to stands  Ambulation/Gait Ambulation/Gait assistance: Min guard Ambulation Distance (Feet): 35 Feet Assistive device: Rolling walker (2 wheeled) Gait Pattern/deviations: Decreased step  length - right;Decreased step length - left;Decreased stride length   Gait velocity interpretation: Below normal speed for age/gender General Gait Details: demonstrates slightly labored cadence, limited secondary to c/o fatigue/SOB while on 3 LPM O2, O2 sats at 96-98%   Stairs            Wheelchair Mobility    Modified Rankin (Stroke Patients  Only)       Balance Overall balance assessment: Needs assistance Sitting-balance support: Feet unsupported;No upper extremity supported Sitting balance-Leahy Scale: Good     Standing balance support: Bilateral upper extremity supported;During functional activity Standing balance-Leahy Scale: Fair                              Cognition Arousal/Alertness: Awake/alert Behavior During Therapy: WFL for tasks assessed/performed Overall Cognitive Status: Within Functional Limits for tasks assessed                                        Exercises General Exercises - Lower Extremity Ankle Circles/Pumps: Seated;AROM;Both;10 reps Long Arc Quad: Seated;AROM;Both;10 reps Hip Flexion/Marching: Seated;AROM;Both;10 reps    General Comments        Pertinent Vitals/Pain Pain Assessment: No/denies pain    Home Living                      Prior Function            PT Goals (current goals can now be found in the care plan section) Acute Rehab PT Goals Patient Stated Goal: return home after rehab PT Goal Formulation: With patient Time For Goal Achievement: 05/27/17 Potential to Achieve Goals: Good Progress towards PT goals: Progressing toward goals    Frequency    Min 3X/week      PT Plan Current plan remains appropriate    Co-evaluation              AM-PAC PT "6 Clicks" Daily Activity  Outcome Measure  Difficulty turning over in bed (including adjusting bedclothes, sheets and blankets)?: None Difficulty moving from lying on back to sitting on the side of the bed? : None Difficulty sitting down on and standing up from a chair with arms (e.g., wheelchair, bedside commode, etc,.)?: A Little Help needed moving to and from a bed to chair (including a wheelchair)?: A Little Help needed walking in hospital room?: A Little Help needed climbing 3-5 steps with a railing? : A Little 6 Click Score: 20    End of Session Equipment  Utilized During Treatment: Oxygen;Gait belt Activity Tolerance: Patient tolerated treatment well;Patient limited by fatigue Patient left: in chair;with call bell/phone within reach Nurse Communication: Mobility status PT Visit Diagnosis: Unsteadiness on feet (R26.81);Other abnormalities of gait and mobility (R26.89);Muscle weakness (generalized) (M62.81)     Time: 1610-9604 PT Time Calculation (min) (ACUTE ONLY): 23 min  Charges:  $Therapeutic Activity: 23-37 mins                    G Codes:       2:31 PM, 05-31-17 Ocie Bob, MPT Physical Therapist with [facility] [phone] office [phone] mobile phone

## 2017-05-23 LAB — GLUCOSE, CAPILLARY
GLUCOSE-CAPILLARY: 230 mg/dL — AB (ref 65–99)
Glucose-Capillary: 235 mg/dL — ABNORMAL HIGH (ref 65–99)
Glucose-Capillary: 173 mg/dL — ABNORMAL HIGH (ref 65–99)
Glucose-Capillary: 136 mg/dL — ABNORMAL HIGH (ref 65–99)

## 2017-05-23 MED ORDER — AMOXICILLIN-POT CLAVULANATE 500-125 MG PO TABS
1.0000 | ORAL_TABLET | Freq: Three times a day (TID) | ORAL | Status: DC
  Administered 2017-05-23 – 2017-05-25 (×5): 500 mg via ORAL
  Filled 2017-05-23 (×5): qty 1

## 2017-05-23 MED ORDER — INSULIN GLARGINE 100 UNIT/ML ~~LOC~~ SOLN
8.0000 [IU] | Freq: Every day | SUBCUTANEOUS | Status: DC
  Administered 2017-05-24 – 2017-05-25 (×2): 8 [IU] via SUBCUTANEOUS
  Filled 2017-05-23 (×3): qty 0.08

## 2017-05-23 NOTE — Progress Notes (Signed)
PROGRESS NOTE    Ellen Woods   ZOX:[id number]  DOB: 05/29/1940  DOA: 05/05/2017 PCP: Dettinger, Elige RadonJoshua A, MD   Brief Narrative:  Ellen Woods is a 77 y.o. female with medical history significant of COPD, coronary artery disease, diabetes mellitus type 2, dyspnea on exertion who presented to the emergency department with palpitations and facial swelling. In the ER she was found to have A-fib with RVR and pulmonary edema. She has had a prolonged hospital stay. She underwent cardioversion on 1/8 and has been in NSR since.  She developed acute respiratory failure which we are still trying to resolve- see below.  She had complaints of dysphagia, nausea which were evaluated with an EGD and an esophagram.  Subjective: Pt reports that she is breathing ok and wants to eat some breakfast.   Assessment & Plan:   Atrial fibrillation with RVR - underwent TEE cardioversion on 1/8 - CHA2DS2-VASc Score 5 - being managed by cardiology with Amiodarone and Lopressor - has been on Coumadin but was put on hold for EGD -  on NOAC now Pt is on eliquis BID for anticoagulation, monitoring for bleeding.  - HR has been controlled and stable.  Cardioversion was successful.  - Pt is on metoprolol 25 mg BID for rate control.   Nausea/ vomiting- esophageal stricture - occasional dysphagia and regurgitation of food per daughter  - upper GI series shows severe impairment of esophageal motility, a narrowing at the GE junction, a hiatal hernia and possible GOO vs gastroparesis - she needed esophageal dilation about 15 yrs ago  - 1/11- EGD did not reveal any stricture- GI junction was "empirically dilated"  In case she has achalasia - her regurgitation symptoms are likely due to severe esophageal dysmotility and a hiatal hernia- she continues to have episodes of regurgitation and vomiting   GI is recommending outpatient manometry study.    Dyspnea/ acute resp distress- due to acute d CHF  and or b/l lobar pneumonia - 1/9-  RR is in 20s on my exam and there are crackles at bases -  - per patient, she has had a cough for about 1 wk - CXR >>- is not clear on whether this is pneumonia or CHF  - started on lasix and Levaquin  - 1/10- mild improvement noted- she states cough is gone but she is still hypoxic- cont to diurese and treat with Levaquin and attempt to wean O2 1/11- symptoms appear to be improving- she is not dyspneic today- wean O2- cont diuretics and antibiotics 1/12- repeat CXR shows bilateral multifocal pneumonia which is quite extensive in the right lung base- ? If she aspirated in relation to above esophageal dysmotility- will change Levaquin to Zosyn today- she still has extensive crackles on exam and is still requiring O2- lasix held  - 1/13- cont current management with anibiotics- wean O2 as able - 1/14- continues to be hypoxic- WBC improving- have obtained a CT of the chest which reveals a mod-large right sided pleural effusion which was not seen on the CXR and multiple infiltrates- difficult to tell if this is CHF or an atypical infection or autoimmune - s/p thoracentesis ...530 cc of yellow fluid obtained- pleural fluid results reveal a transudate and lasix has been resumed - 1/14- given a dose of Levaquin in case we are missing an atypical organism with Zosyn -- cont to follow- have asked for pulmonary consult  - continue antibiotics, prednisone 40 mg daily x 1 week, 30  mg daily x 1 week, 20 mg daily x 1 week.  Awaiting for insurance authorization for discharge to SNF bed.  Hypotension - Resolved now.   Pt was given bolus of IVFs and BP has improved.  DC'd lasix.   Severe constipation  - on Linzess    Chronic diastolic heart failure  - see above -pleural fluid looks like transudate - Lasix resumed- follow I and O and daily weights  CKD 3-4   - stable, following.   Hypokalemia - ordered replacement, follow. Mg 1.9    Brittle, insulin dependent Diabetes  type 2 - continue to monitor closely as her diet intake is fluctuating, high risk for hypoglycemia CBG (last 3)  Recent Labs    05/22/17 1621 05/22/17 2100 05/23/17 0736  GLUCAP 192* 204* 230*     Hypothyroidism - continue home dose Synthroid    DVT prophylaxis: SCDs Code Status: Full code Family Communication: daughter Disposition Plan: Awaiting for insurance authorization for discharge to SNF bed. Consultants:   Cardiology  GI Procedures:  2 D ECHO Left ventricle: The cavity size was normal. Wall thickness was  normal. Systolic function was normal. The estimated ejection   fraction was in the range of 60% to 65%. Wall motion was normal;  there were no regional wall motion abnormalities. - Aortic valve: Mildly calcified annulus. Trileaflet; normal thickness leaflets. Valve area (VTI): 1.87 cm^2. - Mitral valve: There was mild regurgitation. - Left atrium: The atrium was moderately dilated. - Right atrium: The atrium was mildly dilated. - Atrial septum: No defect or patent foramen ovale was identified. - Pulmonary arteries: Systolic pressure was mildly to moderately  increased. PA peak pressure: 40 mm Hg (S).  TEE cardioversion  Antimicrobials:  Anti-infectives (From admission, onward)   Start     Dose/Rate Route Frequency Ordered Stop   05/23/17 0000  piperacillin-tazobactam (ZOSYN) IVPB 3.375 g     3.375 g 12.5 mL/hr over 240 Minutes Intravenous Every 12 hours 05/22/17 1415     05/21/17 2200  piperacillin-tazobactam (ZOSYN) IVPB 2.25 g  Status:  Discontinued     2.25 g 100 mL/hr over 30 Minutes Intravenous Every 8 hours 05/21/17 1437 05/21/17 1502   05/21/17 2200  piperacillin-tazobactam (ZOSYN) 2.25 g in dextrose 5 % 50 mL IVPB  Status:  Discontinued     2.25 g 100 mL/hr over 30 Minutes Intravenous Every 8 hours 05/21/17 1502 05/22/17 1415   05/19/17 1300  doxycycline (VIBRAMYCIN) 100 mg in dextrose 5 % 250 mL IVPB  Status:  Discontinued     100 mg 125 mL/hr over  120 Minutes Intravenous Every 12 hours 05/19/17 1254 05/20/17 1520   05/18/17 0815  levofloxacin (LEVAQUIN) IVPB 500 mg  Status:  Discontinued     500 mg 100 mL/hr over 60 Minutes Intravenous Every 48 hours 05/18/17 0748 05/18/17 1048   05/16/17 1030  piperacillin-tazobactam (ZOSYN) IVPB 3.375 g  Status:  Discontinued     3.375 g 12.5 mL/hr over 240 Minutes Intravenous Every 8 hours 05/16/17 1008 05/21/17 1437   05/15/17 1800  levofloxacin (LEVAQUIN) IVPB 500 mg  Status:  Discontinued     500 mg 100 mL/hr over 60 Minutes Intravenous Every 48 hours 05/15/17 1204 05/16/17 0941   05/13/17 1800  levofloxacin (LEVAQUIN) IVPB 750 mg  Status:  Discontinued     750 mg 100 mL/hr over 90 Minutes Intravenous Every 48 hours 05/13/17 1610 05/15/17 1204     Objective: Vitals:   05/22/17 0839 05/22/17 1500 05/22/17 2046  05/23/17 0600  BP:  (!) 96/43 (!) 106/41 107/74  Pulse: 68 64 (!) 52 (!) 58  Resp:  16 16 16   Temp:  98.2 F (36.8 C) 98.3 F (36.8 C) 97.9 F (36.6 C)  TempSrc:  Oral Oral Oral  SpO2:  98% 97% 100%  Weight:    65.6 kg (144 lb 10 oz)  Height:        Intake/Output Summary (Last 24 hours) at 05/23/2017 1011 Last data filed at 05/23/2017 0601 Gross per 24 hour  Intake 800 ml  Output 400 ml  Net 400 ml   Filed Weights   05/20/17 0521 05/21/17 0603 05/23/17 0600  Weight: 59.3 kg (130 lb 11.7 oz) 59.6 kg (131 lb 6.3 oz) 65.6 kg (144 lb 10 oz)   Examination: General exam: Appears comfortable, sitting up in chair.  NAD.  HEENT: PERRLA, oral mucosa moist, no sclera icterus or thrush Respiratory system: BBS clear to auscultation.  Cardiovascular system: S1 & S2 heard.  No murmurs, rubs, gallops.  Gastrointestinal system: Abdomen soft, non-tender, nondistended. Normal bowel sound. No organomegaly. Central nervous system: Alert and oriented. No focal neurological deficits. Extremities: No cyanosis, clubbing or edema. Skin: No rashes or ulcers. Psychiatry:  Mood & affect  appropriate.   Data Reviewed: I have personally reviewed following labs and imaging studies  CBC: Recent Labs  Lab 05/17/17 0551 05/18/17 0446 05/19/17 0443 05/20/17 0622 05/21/17 0504  WBC 10.7* 12.9* 11.5* 10.1 11.0*  HGB 10.7* 10.7* 9.9* 10.4* 10.7*  HCT 34.8* 35.0* 32.4* 33.2* 33.4*  MCV 100.6* 100.0 99.1 99.1 97.4  PLT 310 333 312 294 328   Basic Metabolic Panel: Recent Labs  Lab 05/17/17 1230 05/18/17 0446 05/19/17 0443 05/20/17 0622 05/21/17 0504 05/22/17 0512  NA  --  144 141 139 138 138  K  --  4.2 3.3* 3.0* 3.7 3.7  CL  --  100* 99* 95* 95* 100*  CO2  --  32 30 29 29 26   GLUCOSE  --  184* 159* 255* 240* 238*  BUN  --  51* 48* 45* 55* 74*  CREATININE  --  1.83* 1.79* 1.99* 2.33* 2.93*  CALCIUM  --  7.7* 7.2* 7.1* 7.3* 6.9*  MG 2.2 2.0  --   --  1.9  --    GFR: Estimated Creatinine Clearance: 13.5 mL/min (A) (by C-G formula based on SCr of 2.93 mg/dL (H)). Liver Function Tests: No results for input(s): AST, ALT, ALKPHOS, BILITOT, PROT, ALBUMIN in the last 168 hours. No results for input(s): LIPASE, AMYLASE in the last 168 hours. No results for input(s): AMMONIA in the last 168 hours. Coagulation Profile: Recent Labs  Lab 05/17/17 0511  INR 3.27   Cardiac Enzymes: No results for input(s): CKTOTAL, CKMB, CKMBINDEX, TROPONINI in the last 168 hours. BNP (last 3 results) No results for input(s): PROBNP in the last 8760 hours. HbA1C: No results for input(s): HGBA1C in the last 72 hours. CBG: Recent Labs  Lab 05/22/17 0749 05/22/17 1110 05/22/17 1621 05/22/17 2100 05/23/17 0736  GLUCAP 271* 373* 192* 204* 230*   Lipid Profile: No results for input(s): CHOL, HDL, LDLCALC, TRIG, CHOLHDL, LDLDIRECT in the last 72 hours. Thyroid Function Tests: No results for input(s): TSH, T4TOTAL, FREET4, T3FREE, THYROIDAB in the last 72 hours. Anemia Panel: No results for input(s): VITAMINB12, FOLATE, FERRITIN, TIBC, IRON, RETICCTPCT in the last 72 hours. Urine  analysis:    Component Value Date/Time   COLORURINE YELLOW 03/14/2014 1227   APPEARANCEUR CLEAR 03/14/2014  1227   LABSPEC 1.015 03/14/2014 1227   PHURINE 5.0 03/14/2014 1227   GLUCOSEU 100 (A) 03/14/2014 1227   HGBUR NEGATIVE 03/14/2014 1227   BILIRUBINUR neg 06/13/2015 1710   KETONESUR NEGATIVE 03/14/2014 1227   PROTEINUR neg 06/13/2015 1710   PROTEINUR NEGATIVE 03/14/2014 1227   UROBILINOGEN negative 06/13/2015 1710   UROBILINOGEN 0.2 03/14/2014 1227   NITRITE neg 06/13/2015 1710   NITRITE NEGATIVE 03/14/2014 1227   LEUKOCYTESUR Negative 06/13/2015 1710   Recent Results (from the past 240 hour(s))  Respiratory Panel by PCR     Status: None   Collection Time: 05/18/17 10:12 AM  Result Value Ref Range Status   Adenovirus NOT DETECTED NOT DETECTED Final   Coronavirus 229E NOT DETECTED NOT DETECTED Final   Coronavirus HKU1 NOT DETECTED NOT DETECTED Final   Coronavirus NL63 NOT DETECTED NOT DETECTED Final   Coronavirus OC43 NOT DETECTED NOT DETECTED Final   Metapneumovirus NOT DETECTED NOT DETECTED Final   Rhinovirus / Enterovirus NOT DETECTED NOT DETECTED Final   Influenza A NOT DETECTED NOT DETECTED Final   Influenza B NOT DETECTED NOT DETECTED Final   Parainfluenza Virus 1 NOT DETECTED NOT DETECTED Final   Parainfluenza Virus 2 NOT DETECTED NOT DETECTED Final   Parainfluenza Virus 3 NOT DETECTED NOT DETECTED Final   Parainfluenza Virus 4 NOT DETECTED NOT DETECTED Final   Respiratory Syncytial Virus NOT DETECTED NOT DETECTED Final   Bordetella pertussis NOT DETECTED NOT DETECTED Final   Chlamydophila pneumoniae NOT DETECTED NOT DETECTED Final   Mycoplasma pneumoniae NOT DETECTED NOT DETECTED Final  Gram stain     Status: None   Collection Time: 05/18/17 10:50 AM  Result Value Ref Range Status   Specimen Description PLEURAL  Final   Special Requests NONE  Final   Gram Stain   Final    NO ORGANISMS SEEN WBC PRESENT,BOTH PMN AND MONONUCLEAR CYTOSPIN SMEAR    Report  Status 05/18/2017 FINAL  Final    Radiology Studies: No results found. Scheduled Meds: . amiodarone  200 mg Oral BID  . amLODipine  2.5 mg Oral Daily  . apixaban  2.5 mg Oral BID  . dextrose  25 mL Intravenous Once  . DULoxetine  30 mg Oral Daily  . feeding supplement (GLUCERNA SHAKE)  237 mL Oral BID BM  . insulin aspart  0-9 Units Subcutaneous TID WC  . insulin aspart  3 Units Subcutaneous TID WC  . [START ON 05/24/2017] insulin glargine  8 Units Subcutaneous Daily  . levothyroxine  50 mcg Oral QAC breakfast  . linaclotide  290 mcg Oral QAC breakfast  . metoCLOPramide (REGLAN) injection  5 mg Intravenous TID AC  . metoprolol tartrate  25 mg Oral BID  . pantoprazole  40 mg Oral BID AC  . predniSONE  40 mg Oral Q breakfast  . rosuvastatin  5 mg Oral Daily  . sodium chloride flush  3 mL Intravenous Q12H   Continuous Infusions: . sodium chloride    . piperacillin-tazobactam (ZOSYN)  IV 3.375 g (05/23/17 0913)    LOS: 18 days   Standley Dakins, MD [facility] Pager: [phone] [email or web address] Password [email or web address] 05/23/2017, 10:11 AM

## 2017-05-23 NOTE — Progress Notes (Signed)
Subjective: She says she feels better.  She has no new complaints.  She still does not feel like she is able to eat as much as she would like but she is not vomiting.  Her breathing is doing okay.  Objective: Vital signs in last 24 hours: Temp:  [97.9 F (36.6 C)-98.3 F (36.8 C)] 97.9 F (36.6 C) (01/19 0600) Pulse Rate:  [52-64] 58 (01/19 0600) Resp:  [16] 16 (01/19 0600) BP: (96-107)/(41-74) 107/74 (01/19 0600) SpO2:  [97 %-100 %] 100 % (01/19 0600) Weight:  [65.6 kg (144 lb 10 oz)] 65.6 kg (144 lb 10 oz) (01/19 0600) Weight change:  Last BM Date: 05/21/17  Intake/Output from previous day: 01/18 0701 - 01/19 0700 In: 920 [P.O.:720; IV Piggyback:200] Out: 550 [Urine:550]  PHYSICAL EXAM General appearance: alert, cooperative and no distress Resp: Her chest is clear today Cardio: regular rate and rhythm, S1, S2 normal, no murmur, click, rub or gallop GI: soft, non-tender; bowel sounds normal; no masses,  no organomegaly Extremities: extremities normal, atraumatic, no cyanosis or edema Skin warm and dry  Lab Results:  Results for orders placed or performed during the hospital encounter of 05/05/17 (from the past 48 hour(s))  Glucose, capillary     Status: Abnormal   Collection Time: 05/21/17 12:13 PM  Result Value Ref Range   Glucose-Capillary 361 (H) 65 - 99 mg/dL   Comment 1 Notify RN    Comment 2 Document in Chart   Glucose, capillary     Status: Abnormal   Collection Time: 05/21/17  4:03 PM  Result Value Ref Range   Glucose-Capillary 341 (H) 65 - 99 mg/dL   Comment 1 Notify RN    Comment 2 Document in Chart   Glucose, capillary     Status: Abnormal   Collection Time: 05/21/17 10:22 PM  Result Value Ref Range   Glucose-Capillary 202 (H) 65 - 99 mg/dL  Basic metabolic panel     Status: Abnormal   Collection Time: 05/22/17  5:12 AM  Result Value Ref Range   Sodium 138 135 - 145 mmol/L   Potassium 3.7 3.5 - 5.1 mmol/L   Chloride 100 (L) 101 - 111 mmol/L   CO2 26  22 - 32 mmol/L   Glucose, Bld 238 (H) 65 - 99 mg/dL   BUN 74 (H) 6 - 20 mg/dL   Creatinine, Ser 2.93 (H) 0.44 - 1.00 mg/dL   Calcium 6.9 (L) 8.9 - 10.3 mg/dL   GFR calc non Af Amer 15 (L) >60 mL/min   GFR calc Af Amer 17 (L) >60 mL/min    Comment: (NOTE) The eGFR has been calculated using the CKD EPI equation. This calculation has not been validated in all clinical situations. eGFR's persistently <60 mL/min signify possible Chronic Kidney Disease.    Anion gap 12 5 - 15  Glucose, capillary     Status: Abnormal   Collection Time: 05/22/17  7:49 AM  Result Value Ref Range   Glucose-Capillary 271 (H) 65 - 99 mg/dL  Glucose, capillary     Status: Abnormal   Collection Time: 05/22/17 11:10 AM  Result Value Ref Range   Glucose-Capillary 373 (H) 65 - 99 mg/dL  Glucose, capillary     Status: Abnormal   Collection Time: 05/22/17  4:21 PM  Result Value Ref Range   Glucose-Capillary 192 (H) 65 - 99 mg/dL  Glucose, capillary     Status: Abnormal   Collection Time: 05/22/17  9:00 PM  Result Value Ref Range  Glucose-Capillary 204 (H) 65 - 99 mg/dL   Comment 1 Notify RN    Comment 2 Document in Chart   Glucose, capillary     Status: Abnormal   Collection Time: 05/23/17  7:36 AM  Result Value Ref Range   Glucose-Capillary 230 (H) 65 - 99 mg/dL    ABGS No results for input(s): PHART, PO2ART, TCO2, HCO3 in the last 72 hours.  Invalid input(s): PCO2 CULTURES Recent Results (from the past 240 hour(s))  Respiratory Panel by PCR     Status: None   Collection Time: 05/18/17 10:12 AM  Result Value Ref Range Status   Adenovirus NOT DETECTED NOT DETECTED Final   Coronavirus 229E NOT DETECTED NOT DETECTED Final   Coronavirus HKU1 NOT DETECTED NOT DETECTED Final   Coronavirus NL63 NOT DETECTED NOT DETECTED Final   Coronavirus OC43 NOT DETECTED NOT DETECTED Final   Metapneumovirus NOT DETECTED NOT DETECTED Final   Rhinovirus / Enterovirus NOT DETECTED NOT DETECTED Final   Influenza A NOT  DETECTED NOT DETECTED Final   Influenza B NOT DETECTED NOT DETECTED Final   Parainfluenza Virus 1 NOT DETECTED NOT DETECTED Final   Parainfluenza Virus 2 NOT DETECTED NOT DETECTED Final   Parainfluenza Virus 3 NOT DETECTED NOT DETECTED Final   Parainfluenza Virus 4 NOT DETECTED NOT DETECTED Final   Respiratory Syncytial Virus NOT DETECTED NOT DETECTED Final   Bordetella pertussis NOT DETECTED NOT DETECTED Final   Chlamydophila pneumoniae NOT DETECTED NOT DETECTED Final   Mycoplasma pneumoniae NOT DETECTED NOT DETECTED Final  Gram stain     Status: None   Collection Time: 05/18/17 10:50 AM  Result Value Ref Range Status   Specimen Description PLEURAL  Final   Special Requests NONE  Final   Gram Stain   Final    NO ORGANISMS SEEN WBC PRESENT,BOTH PMN AND MONONUCLEAR CYTOSPIN SMEAR    Report Status 05/18/2017 FINAL  Final   Studies/Results: No results found.  Medications:  Prior to Admission:  Medications Prior to Admission  Medication Sig Dispense Refill Last Dose  . acetaminophen (TYLENOL) 325 MG tablet Take 2 tablets (650 mg total) by mouth every 6 (six) hours as needed.   unknown  . ALPRAZolam (XANAX) 0.5 MG tablet Take 1 tablet (0.5 mg total) by mouth 2 (two) times daily as needed. 160 tablet 0 05/05/2017 at Unknown time  . amLODipine (NORVASC) 5 MG tablet Take 1 tablet (5 mg total) daily by mouth. For blood pressure 90 tablet 1 05/04/2017 at Unknown time  . aspirin 81 MG tablet Take 81 mg by mouth daily.   unknown  . Calcium Carb-Cholecalciferol (CALCIUM 500 + D3) 500-600 MG-UNIT TABS Take 1 tablet by mouth daily. 60 tablet  05/04/2017 at Unknown time  . DULoxetine (CYMBALTA) 30 MG capsule Take 1 capsule (30 mg total) by mouth daily. 90 capsule 1 05/04/2017 at Unknown time  . furosemide (LASIX) 40 MG tablet 20 mg. Take one tablet by mouth as needed   05/05/2017 at Unknown time  . Insulin Glargine (LANTUS SOLOSTAR) 100 UNIT/ML Solostar Pen Inject 15 Units into the skin daily with  breakfast. (Patient taking differently: Inject 14 Units into the skin daily with breakfast. ) 15 mL 2 05/05/2017 at Unknown time  . KLOR-CON M20 20 MEQ tablet TAKE 1 TABLET EVERY DAY 90 tablet 1 05/04/2017 at Unknown time  . levothyroxine (SYNTHROID, LEVOTHROID) 50 MCG tablet TAKE 1 TABLET EVERY DAY BEFORE BREAKFAST 90 tablet 1 05/05/2017 at Unknown time  . lisinopril (PRINIVIL,ZESTRIL)  40 MG tablet Take 1 tablet (40 mg total) by mouth daily. 30 tablet 6 05/05/2017 at Unknown time  . metoprolol tartrate (LOPRESSOR) 25 MG tablet TAKE 1 TABLET TWICE DAILY 180 tablet 1 05/05/2017 at 0900  . NITROSTAT 0.4 MG SL tablet DISSOLVE  1 TABLET  UNDER THE TONGUE EVERY 5 MINUTES AS NEEDED FOR CHEST PAIN 25 tablet 0 unknown  . NOVOLOG FLEXPEN 100 UNIT/ML FlexPen INJECT 3 TO 5 UNITS SUBCUTANEOUSLY THREE TIMES DAILY WITH MEALS 15 mL 3 05/05/2017 at Unknown time  . PROLIA 60 MG/ML SOLN injection Inject 60 mg into the skin every 6 (six) months. To be administered 08/20/2016 1 each 0 unkmown  . rosuvastatin (CRESTOR) 5 MG tablet Take 1 tablet (5 mg total) daily by mouth. 90 tablet 1 05/05/2017 at Unknown time   Scheduled: . amiodarone  200 mg Oral BID  . amLODipine  2.5 mg Oral Daily  . apixaban  2.5 mg Oral BID  . dextrose  25 mL Intravenous Once  . DULoxetine  30 mg Oral Daily  . feeding supplement (GLUCERNA SHAKE)  237 mL Oral BID BM  . insulin aspart  0-9 Units Subcutaneous TID WC  . insulin aspart  3 Units Subcutaneous TID WC  . insulin glargine  7 Units Subcutaneous Daily  . levothyroxine  50 mcg Oral QAC breakfast  . linaclotide  290 mcg Oral QAC breakfast  . metoCLOPramide (REGLAN) injection  5 mg Intravenous TID AC  . metoprolol tartrate  25 mg Oral BID  . pantoprazole  40 mg Oral BID AC  . predniSONE  40 mg Oral Q breakfast  . rosuvastatin  5 mg Oral Daily  . sodium chloride flush  3 mL Intravenous Q12H   Continuous: . sodium chloride    . piperacillin-tazobactam (ZOSYN)  IV 3.375 g (05/23/17 0913)    YJE:[id number], ALPRAZolam, alum & mag hydroxide-simeth, bisacodyl, HYDROcodone-acetaminophen, promethazine, sodium chloride, sodium chloride flush  Assesment: She was admitted with shortness of breath and atrial fibrillation with rapid and pulmonary edema.  She is markedly improved.  She has diffuse bilateral infiltrates presumed to be related to aspiration but there is some possibility that this is related to inflammatory lung disease based on her elevated sedimentation rate she has definitely improved on steroids.  Apparently plan now is for her to go to skilled care facility which I think is appropriate after her prolonged hospitalization and generalized weakness. Principal Problem:   Atrial fibrillation with RVR (HCC) Active Problems:   Mixed hyperlipidemia   Essential hypertension   Coronary artery disease involving native coronary artery of native heart with angina pectoris (HCC)   Chronic diastolic heart failure (HCC)   Diabetes type 2, uncontrolled ([place])   Hypothyroidism   Uncontrolled type 2 diabetes mellitus with hyperglycemia (HCC)   Atrial fibrillation, new onset (HCC)   Congestive heart failure (HCC)   Intractable cyclical vomiting with nausea   Dysphagia   Esophageal pain   Hyperglycemia   Hypoglycemia   N&V (nausea and vomiting)    Plan: I will plan to follow more peripherally now.  When she goes to the skilled care facility she will need to be on 40 mg of prednisone daily for a week then 30 mg daily for a week then 20 mg daily for a week and then stop and she should have repeat chest x-ray or CT.  Thanks for allowing me to participate in her care    LOS: 18 days   Jarome Trull L 05/23/2017, 10:05 AM

## 2017-05-24 LAB — GLUCOSE, CAPILLARY
GLUCOSE-CAPILLARY: 289 mg/dL — AB (ref 65–99)
GLUCOSE-CAPILLARY: 156 mg/dL — AB (ref 65–99)
Glucose-Capillary: 326 mg/dL — ABNORMAL HIGH (ref 65–99)

## 2017-05-24 MED ORDER — BISACODYL 10 MG RE SUPP
10.0000 mg | Freq: Once | RECTAL | Status: AC
  Administered 2017-05-24: 10 mg via RECTAL
  Filled 2017-05-24: qty 1

## 2017-05-24 MED ORDER — MINERAL OIL RE ENEM: 1.0000 | ENEMA | Freq: Once | RECTAL | Status: DC | PRN

## 2017-05-24 MED ORDER — POLYETHYLENE GLYCOL 3350 17 G PO PACK
17.0000 g | PACK | Freq: Every day | ORAL | Status: DC
  Administered 2017-05-24 – 2017-05-25 (×2): 17 g via ORAL
  Filled 2017-05-24 (×2): qty 1

## 2017-05-24 NOTE — Progress Notes (Signed)
PROGRESS NOTE    Ellen Woods   ZOX:[id number]  DOB: 19-Apr-1941  DOA: 05/05/2017 PCP: Dettinger, Elige Radon, MD   Brief Narrative:  Ellen Woods is a 77 y.o. female with medical history significant of COPD, coronary artery disease, diabetes mellitus type 2, dyspnea on exertion who presented to the emergency department with palpitations and facial swelling. In the ER she was found to have A-fib with RVR and pulmonary edema. She has had a prolonged hospital stay. She underwent cardioversion on 1/8 and has been in NSR since.  She developed acute respiratory failure which we are still trying to resolve- see below.  She had complaints of dysphagia, nausea which were evaluated with an EGD and an esophagram.  Subjective: Pt starting to change her mind about SNF, I strongly advised her to go to SNF, I feel that she has become too deconditioned to go home now.  She verbalized understanding, otherwise she is feeling better.     Assessment & Plan:   Atrial fibrillation with RVR - underwent TEE cardioversion on 1/8 - CHA2DS2-VASc Score 5 - being managed by cardiology with Amiodarone and Lopressor - has been on Coumadin but was put on hold for EGD -  on NOAC now Pt is on eliquis BID for anticoagulation, monitoring for bleeding.  - HR has been controlled and stable.  Cardioversion was successful.  - Pt is on metoprolol 25 mg BID for rate control.   Nausea/ vomiting- esophageal stricture - occasional dysphagia and regurgitation of food per daughter  - upper GI series shows severe impairment of esophageal motility, a narrowing at the GE junction, a hiatal hernia and possible GOO vs gastroparesis - she needed esophageal dilation about 15 yrs ago  - 1/11- EGD did not reveal any stricture- GI junction was "empirically dilated"  In case she has achalasia  GI is recommending outpatient manometry study.  Arrange outpatient GI follow up.   Dyspnea/ acute resp distress- due to  acute d CHF and or b/l lobar pneumonia - 1/9-  RR is in 20s on my exam and there are crackles at bases -  - per patient, she has had a cough for about 1 wk - CXR >>- is not clear on whether this is pneumonia or CHF  - started on lasix and Levaquin  - 1/10- mild improvement noted- she states cough is gone but she is still hypoxic- cont to diurese and treat with Levaquin and attempt to wean O2 1/11- symptoms appear to be improving- she is not dyspneic today- wean O2- cont diuretics and antibiotics 1/12- repeat CXR shows bilateral multifocal pneumonia which is quite extensive in the right lung base- ? If she aspirated in relation to above esophageal dysmotility- will change Levaquin to Zosyn today- she still has extensive crackles on exam and is still requiring O2- lasix held  - 1/13- cont current management with anibiotics- wean O2 as able - 1/14- continues to be hypoxic- WBC improving- have obtained a CT of the chest which reveals a mod-large right sided pleural effusion which was not seen on the CXR and multiple infiltrates- difficult to tell if this is CHF or an atypical infection or autoimmune - s/p thoracentesis ...530 cc of yellow fluid obtained- pleural fluid results reveal a transudate and lasix has been resumed - 1/14- given a dose of Levaquin in case we are missing an atypical organism with Zosyn -- cont to follow- have asked for pulmonary consult  - continue antibiotics, prednisone 40  mg daily x 1 week, 30 mg daily x 1 week, 20 mg daily x 1 week.  Awaiting for insurance authorization for discharge to SNF bed.  Hypotension - Resolved now.   Pt was given bolus of IVFs and BP has improved.  DC'd lasix.   Severe constipation  - on Linzess, last BM 1/17    Chronic diastolic heart failure  - see above -pleural fluid looks like transudate - Lasix resumed- follow I and O and daily weights  CKD 3-4   - stable, following.   Hypokalemia - ordered replacement, follow. Mg 1.9    Brittle,  insulin dependent Diabetes type 2 - continue to monitor closely as her diet intake is fluctuating, high risk for hypoglycemia CBG (last 3)  Recent Labs    05/23/17 1631 05/23/17 2038 05/24/17 0744  GLUCAP 173* 136* 326*     Hypothyroidism - continue home dose Synthroid    DVT prophylaxis: SCDs Code Status: Full code Family Communication: daughter Disposition Plan: Awaiting for insurance authorization for discharge to SNF bed.  Hopefully can discharge 1/21 Consultants:   Cardiology  GI Procedures:  2 D ECHO Left ventricle: The cavity size was normal. Wall thickness was  normal. Systolic function was normal. The estimated ejection   fraction was in the range of 60% to 65%. Wall motion was normal;  there were no regional wall motion abnormalities. - Aortic valve: Mildly calcified annulus. Trileaflet; normal thickness leaflets. Valve area (VTI): 1.87 cm^2. - Mitral valve: There was mild regurgitation. - Left atrium: The atrium was moderately dilated. - Right atrium: The atrium was mildly dilated. - Atrial septum: No defect or patent foramen ovale was identified. - Pulmonary arteries: Systolic pressure was mildly to moderately  increased. PA peak pressure: 40 mm Hg (S).  TEE cardioversion  Antimicrobials:  Anti-infectives (From admission, onward)   Start     Dose/Rate Route Frequency Ordered Stop   05/23/17 2200  amoxicillin-clavulanate (AUGMENTIN) 500-125 MG per tablet 500 mg     1 tablet Oral 3 times daily 05/23/17 1013     05/23/17 0000  piperacillin-tazobactam (ZOSYN) IVPB 3.375 g  Status:  Discontinued     3.375 g 12.5 mL/hr over 240 Minutes Intravenous Every 12 hours 05/22/17 1415 05/23/17 1013   05/21/17 2200  piperacillin-tazobactam (ZOSYN) IVPB 2.25 g  Status:  Discontinued     2.25 g 100 mL/hr over 30 Minutes Intravenous Every 8 hours 05/21/17 1437 05/21/17 1502   05/21/17 2200  piperacillin-tazobactam (ZOSYN) 2.25 g in dextrose 5 % 50 mL IVPB  Status:   Discontinued     2.25 g 100 mL/hr over 30 Minutes Intravenous Every 8 hours 05/21/17 1502 05/22/17 1415   05/19/17 1300  doxycycline (VIBRAMYCIN) 100 mg in dextrose 5 % 250 mL IVPB  Status:  Discontinued     100 mg 125 mL/hr over 120 Minutes Intravenous Every 12 hours 05/19/17 1254 05/20/17 1520   05/18/17 0815  levofloxacin (LEVAQUIN) IVPB 500 mg  Status:  Discontinued     500 mg 100 mL/hr over 60 Minutes Intravenous Every 48 hours 05/18/17 0748 05/18/17 1048   05/16/17 1030  piperacillin-tazobactam (ZOSYN) IVPB 3.375 g  Status:  Discontinued     3.375 g 12.5 mL/hr over 240 Minutes Intravenous Every 8 hours 05/16/17 1008 05/21/17 1437   05/15/17 1800  levofloxacin (LEVAQUIN) IVPB 500 mg  Status:  Discontinued     500 mg 100 mL/hr over 60 Minutes Intravenous Every 48 hours 05/15/17 1204 05/16/17 0941  05/13/17 1800  levofloxacin (LEVAQUIN) IVPB 750 mg  Status:  Discontinued     750 mg 100 mL/hr over 90 Minutes Intravenous Every 48 hours 05/13/17 1610 05/15/17 1204     Objective: Vitals:   05/23/17 1451 05/23/17 1936 05/24/17 0620 05/24/17 0823  BP: (!) 98/44 (!) 109/52 (!) 118/55   Pulse: 60 (!) 56 60 78  Resp: 16 16 16    Temp: 97.7 F (36.5 C) 98.3 F (36.8 C) 98.4 F (36.9 C)   TempSrc: Oral Oral Oral   SpO2: 100% 97% 100%   Weight:   66 kg (145 lb 8.1 oz)   Height:        Intake/Output Summary (Last 24 hours) at 05/24/2017 1058 Last data filed at 05/24/2017 0600 Gross per 24 hour  Intake 600 ml  Output 900 ml  Net -300 ml   Filed Weights   05/21/17 0603 05/23/17 0600 05/24/17 0620  Weight: 59.6 kg (131 lb 6.3 oz) 65.6 kg (144 lb 10 oz) 66 kg (145 lb 8.1 oz)   Examination: General exam: Appears comfortable, sitting up in chair.  NAD.  HEENT: PERRLA, oral mucosa moist, no sclera icterus or thrush Respiratory system: BBS clear to auscultation.  Cardiovascular system: S1 & S2 heard.  No murmurs, rubs, gallops.  Gastrointestinal system: Abdomen soft, non-tender,  nondistended. Normal bowel sound. No organomegaly. Central nervous system: Alert and oriented. No focal neurological deficits. Extremities: No cyanosis, clubbing or edema. Skin: No rashes or ulcers. Psychiatry:  Mood & affect appropriate.   Data Reviewed: I have personally reviewed following labs and imaging studies  CBC: Recent Labs  Lab 05/18/17 0446 05/19/17 0443 05/20/17 0622 05/21/17 0504  WBC 12.9* 11.5* 10.1 11.0*  HGB 10.7* 9.9* 10.4* 10.7*  HCT 35.0* 32.4* 33.2* 33.4*  MCV 100.0 99.1 99.1 97.4  PLT 333 312 294 328   Basic Metabolic Panel: Recent Labs  Lab 05/17/17 1230 05/18/17 0446 05/19/17 0443 05/20/17 0622 05/21/17 0504 05/22/17 0512  NA  --  144 141 139 138 138  K  --  4.2 3.3* 3.0* 3.7 3.7  CL  --  100* 99* 95* 95* 100*  CO2  --  32 30 29 29 26   GLUCOSE  --  184* 159* 255* 240* 238*  BUN  --  51* 48* 45* 55* 74*  CREATININE  --  1.83* 1.79* 1.99* 2.33* 2.93*  CALCIUM  --  7.7* 7.2* 7.1* 7.3* 6.9*  MG 2.2 2.0  --   --  1.9  --    GFR: Estimated Creatinine Clearance: 13.5 mL/min (A) (by C-G formula based on SCr of 2.93 mg/dL (H)). Liver Function Tests: No results for input(s): AST, ALT, ALKPHOS, BILITOT, PROT, ALBUMIN in the last 168 hours. No results for input(s): LIPASE, AMYLASE in the last 168 hours. No results for input(s): AMMONIA in the last 168 hours. Coagulation Profile: No results for input(s): INR, PROTIME in the last 168 hours. Cardiac Enzymes: No results for input(s): CKTOTAL, CKMB, CKMBINDEX, TROPONINI in the last 168 hours. BNP (last 3 results) No results for input(s): PROBNP in the last 8760 hours. HbA1C: No results for input(s): HGBA1C in the last 72 hours. CBG: Recent Labs  Lab 05/23/17 0736 05/23/17 1126 05/23/17 1631 05/23/17 2038 05/24/17 0744  GLUCAP 230* 235* 173* 136* 326*   Lipid Profile: No results for input(s): CHOL, HDL, LDLCALC, TRIG, CHOLHDL, LDLDIRECT in the last 72 hours. Thyroid Function Tests: No results  for input(s): TSH, T4TOTAL, FREET4, T3FREE, THYROIDAB in the last  72 hours. Anemia Panel: No results for input(s): VITAMINB12, FOLATE, FERRITIN, TIBC, IRON, RETICCTPCT in the last 72 hours. Urine analysis:    Component Value Date/Time   COLORURINE YELLOW 03/14/2014 1227   APPEARANCEUR CLEAR 03/14/2014 1227   LABSPEC 1.015 03/14/2014 1227   PHURINE 5.0 03/14/2014 1227   GLUCOSEU 100 (A) 03/14/2014 1227   HGBUR NEGATIVE 03/14/2014 1227   BILIRUBINUR neg 06/13/2015 1710   KETONESUR NEGATIVE 03/14/2014 1227   PROTEINUR neg 06/13/2015 1710   PROTEINUR NEGATIVE 03/14/2014 1227   UROBILINOGEN negative 06/13/2015 1710   UROBILINOGEN 0.2 03/14/2014 1227   NITRITE neg 06/13/2015 1710   NITRITE NEGATIVE 03/14/2014 1227   LEUKOCYTESUR Negative 06/13/2015 1710   Recent Results (from the past 240 hour(s))  Respiratory Panel by PCR     Status: None   Collection Time: 05/18/17 10:12 AM  Result Value Ref Range Status   Adenovirus NOT DETECTED NOT DETECTED Final   Coronavirus 229E NOT DETECTED NOT DETECTED Final   Coronavirus HKU1 NOT DETECTED NOT DETECTED Final   Coronavirus NL63 NOT DETECTED NOT DETECTED Final   Coronavirus OC43 NOT DETECTED NOT DETECTED Final   Metapneumovirus NOT DETECTED NOT DETECTED Final   Rhinovirus / Enterovirus NOT DETECTED NOT DETECTED Final   Influenza A NOT DETECTED NOT DETECTED Final   Influenza B NOT DETECTED NOT DETECTED Final   Parainfluenza Virus 1 NOT DETECTED NOT DETECTED Final   Parainfluenza Virus 2 NOT DETECTED NOT DETECTED Final   Parainfluenza Virus 3 NOT DETECTED NOT DETECTED Final   Parainfluenza Virus 4 NOT DETECTED NOT DETECTED Final   Respiratory Syncytial Virus NOT DETECTED NOT DETECTED Final   Bordetella pertussis NOT DETECTED NOT DETECTED Final   Chlamydophila pneumoniae NOT DETECTED NOT DETECTED Final   Mycoplasma pneumoniae NOT DETECTED NOT DETECTED Final  Gram stain     Status: None   Collection Time: 05/18/17 10:50 AM  Result Value Ref  Range Status   Specimen Description PLEURAL  Final   Special Requests NONE  Final   Gram Stain   Final    NO ORGANISMS SEEN WBC PRESENT,BOTH PMN AND MONONUCLEAR CYTOSPIN SMEAR    Report Status 05/18/2017 FINAL  Final    Radiology Studies: No results found. Scheduled Meds: . amiodarone  200 mg Oral BID  . amLODipine  2.5 mg Oral Daily  . amoxicillin-clavulanate  1 tablet Oral TID  . apixaban  2.5 mg Oral BID  . dextrose  25 mL Intravenous Once  . DULoxetine  30 mg Oral Daily  . feeding supplement (GLUCERNA SHAKE)  237 mL Oral BID BM  . insulin aspart  0-9 Units Subcutaneous TID WC  . insulin aspart  3 Units Subcutaneous TID WC  . insulin glargine  8 Units Subcutaneous Daily  . levothyroxine  50 mcg Oral QAC breakfast  . linaclotide  290 mcg Oral QAC breakfast  . metoCLOPramide (REGLAN) injection  5 mg Intravenous TID AC  . metoprolol tartrate  25 mg Oral BID  . pantoprazole  40 mg Oral BID AC  . predniSONE  40 mg Oral Q breakfast  . rosuvastatin  5 mg Oral Daily  . sodium chloride flush  3 mL Intravenous Q12H   Continuous Infusions: . sodium chloride      LOS: 19 days   Standley Dakinslanford Larnie Heart, MD [facility] Pager: [phone] [email or web address] Password [email or web address] 05/24/2017, 10:58 AM

## 2017-05-25 DIAGNOSIS — J90 Pleural effusion, not elsewhere classified: Secondary | ICD-10-CM | POA: Diagnosis not present

## 2017-05-25 DIAGNOSIS — R069 Unspecified abnormalities of breathing: Secondary | ICD-10-CM | POA: Diagnosis not present

## 2017-05-25 DIAGNOSIS — J9601 Acute respiratory failure with hypoxia: Secondary | ICD-10-CM | POA: Diagnosis not present

## 2017-05-25 DIAGNOSIS — I4891 Unspecified atrial fibrillation: Secondary | ICD-10-CM | POA: Diagnosis not present

## 2017-05-25 DIAGNOSIS — I10 Essential (primary) hypertension: Secondary | ICD-10-CM | POA: Diagnosis not present

## 2017-05-25 DIAGNOSIS — J9621 Acute and chronic respiratory failure with hypoxia: Secondary | ICD-10-CM | POA: Diagnosis not present

## 2017-05-25 DIAGNOSIS — I110 Hypertensive heart disease with heart failure: Secondary | ICD-10-CM | POA: Diagnosis not present

## 2017-05-25 DIAGNOSIS — K228 Other specified diseases of esophagus: Secondary | ICD-10-CM | POA: Diagnosis not present

## 2017-05-25 DIAGNOSIS — J449 Chronic obstructive pulmonary disease, unspecified: Secondary | ICD-10-CM | POA: Diagnosis not present

## 2017-05-25 DIAGNOSIS — B370 Candidal stomatitis: Secondary | ICD-10-CM | POA: Diagnosis not present

## 2017-05-25 DIAGNOSIS — M6281 Muscle weakness (generalized): Secondary | ICD-10-CM | POA: Diagnosis not present

## 2017-05-25 DIAGNOSIS — A419 Sepsis, unspecified organism: Secondary | ICD-10-CM | POA: Diagnosis not present

## 2017-05-25 DIAGNOSIS — I2510 Atherosclerotic heart disease of native coronary artery without angina pectoris: Secondary | ICD-10-CM | POA: Diagnosis not present

## 2017-05-25 DIAGNOSIS — E119 Type 2 diabetes mellitus without complications: Secondary | ICD-10-CM | POA: Diagnosis not present

## 2017-05-25 DIAGNOSIS — J189 Pneumonia, unspecified organism: Secondary | ICD-10-CM | POA: Diagnosis not present

## 2017-05-25 DIAGNOSIS — R1310 Dysphagia, unspecified: Secondary | ICD-10-CM | POA: Diagnosis not present

## 2017-05-25 DIAGNOSIS — I482 Chronic atrial fibrillation: Secondary | ICD-10-CM | POA: Diagnosis not present

## 2017-05-25 DIAGNOSIS — R739 Hyperglycemia, unspecified: Secondary | ICD-10-CM | POA: Diagnosis not present

## 2017-05-25 DIAGNOSIS — E11649 Type 2 diabetes mellitus with hypoglycemia without coma: Secondary | ICD-10-CM | POA: Diagnosis not present

## 2017-05-25 DIAGNOSIS — I5041 Acute combined systolic (congestive) and diastolic (congestive) heart failure: Secondary | ICD-10-CM | POA: Diagnosis not present

## 2017-05-25 DIAGNOSIS — N289 Disorder of kidney and ureter, unspecified: Secondary | ICD-10-CM | POA: Diagnosis not present

## 2017-05-25 DIAGNOSIS — I509 Heart failure, unspecified: Secondary | ICD-10-CM | POA: Diagnosis not present

## 2017-05-25 DIAGNOSIS — N19 Unspecified kidney failure: Secondary | ICD-10-CM | POA: Diagnosis not present

## 2017-05-25 DIAGNOSIS — R0602 Shortness of breath: Secondary | ICD-10-CM | POA: Diagnosis not present

## 2017-05-25 DIAGNOSIS — R0902 Hypoxemia: Secondary | ICD-10-CM | POA: Diagnosis not present

## 2017-05-25 DIAGNOSIS — E162 Hypoglycemia, unspecified: Secondary | ICD-10-CM | POA: Diagnosis not present

## 2017-05-25 DIAGNOSIS — K5909 Other constipation: Secondary | ICD-10-CM | POA: Diagnosis not present

## 2017-05-25 DIAGNOSIS — I25119 Atherosclerotic heart disease of native coronary artery with unspecified angina pectoris: Secondary | ICD-10-CM | POA: Diagnosis not present

## 2017-05-25 DIAGNOSIS — M797 Fibromyalgia: Secondary | ICD-10-CM | POA: Diagnosis not present

## 2017-05-25 DIAGNOSIS — Z7401 Bed confinement status: Secondary | ICD-10-CM | POA: Diagnosis not present

## 2017-05-25 DIAGNOSIS — R279 Unspecified lack of coordination: Secondary | ICD-10-CM | POA: Diagnosis not present

## 2017-05-25 DIAGNOSIS — I5032 Chronic diastolic (congestive) heart failure: Secondary | ICD-10-CM | POA: Diagnosis not present

## 2017-05-25 DIAGNOSIS — J15212 Pneumonia due to Methicillin resistant Staphylococcus aureus: Secondary | ICD-10-CM | POA: Diagnosis not present

## 2017-05-25 LAB — GLUCOSE, CAPILLARY
Glucose-Capillary: 190 mg/dL — ABNORMAL HIGH (ref 65–99)
Glucose-Capillary: 199 mg/dL — ABNORMAL HIGH (ref 65–99)

## 2017-05-25 MED ORDER — LINACLOTIDE 290 MCG PO CAPS: 290.0000 ug | ORAL_CAPSULE | Freq: Every day | ORAL | Status: AC

## 2017-05-25 MED ORDER — APIXABAN 2.5 MG PO TABS: 2.5000 mg | ORAL_TABLET | Freq: Two times a day (BID) | ORAL | Status: AC

## 2017-05-25 MED ORDER — GLUCERNA SHAKE PO LIQD: 237.0000 mL | Freq: Two times a day (BID) | ORAL | 0 refills | Status: AC

## 2017-05-25 MED ORDER — AMIODARONE HCL 200 MG PO TABS: 200.0000 mg | ORAL_TABLET | Freq: Every day | ORAL | Status: AC

## 2017-05-25 MED ORDER — ONDANSETRON HCL 4 MG PO TABS: 4.0000 mg | ORAL_TABLET | Freq: Three times a day (TID) | ORAL | 1 refills | Status: AC | PRN

## 2017-05-25 MED ORDER — ALPRAZOLAM 0.5 MG PO TABS: 0.5000 mg | ORAL_TABLET | Freq: Two times a day (BID) | ORAL | 0 refills | Status: AC | PRN

## 2017-05-25 MED ORDER — INSULIN ASPART 100 UNIT/ML FLEXPEN: 3.0000 [IU] | PEN_INJECTOR | Freq: Three times a day (TID) | SUBCUTANEOUS | 3 refills | Status: AC

## 2017-05-25 MED ORDER — AMLODIPINE BESYLATE 2.5 MG PO TABS: 2.5000 mg | ORAL_TABLET | Freq: Every day | ORAL | Status: AC

## 2017-05-25 MED ORDER — PREDNISONE 10 MG PO TABS: ORAL_TABLET | ORAL | Status: AC

## 2017-05-25 MED ORDER — POLYETHYLENE GLYCOL 3350 17 G PO PACK: 17.0000 g | PACK | Freq: Every day | ORAL | 0 refills | Status: AC | PRN

## 2017-05-25 MED ORDER — METOCLOPRAMIDE HCL 5 MG PO TABS: 5.0000 mg | ORAL_TABLET | Freq: Three times a day (TID) | ORAL | Status: AC

## 2017-05-25 MED ORDER — INSULIN GLARGINE 100 UNIT/ML SOLOSTAR PEN: 8.0000 [IU] | PEN_INJECTOR | Freq: Every day | SUBCUTANEOUS | 2 refills | Status: AC

## 2017-05-25 MED ORDER — POTASSIUM CHLORIDE CRYS ER 20 MEQ PO TBCR: 20.0000 meq | EXTENDED_RELEASE_TABLET | ORAL | 1 refills | Status: AC

## 2017-05-25 MED ORDER — PANTOPRAZOLE SODIUM 40 MG PO TBEC: 40.0000 mg | DELAYED_RELEASE_TABLET | Freq: Two times a day (BID) | ORAL | Status: AC

## 2017-05-25 MED ORDER — FUROSEMIDE 20 MG PO TABS: 20.0000 mg | ORAL_TABLET | ORAL | Status: AC

## 2017-05-25 NOTE — Care Management Important Message (Signed)
Important Message  Patient Details  Name: Ellen HaverGlenda J Merriweather MRN: [id number] Date of Birth: 10/16/1940   Medicare Important Message Given:  Yes    Malcolm MetroChildress, Talor Desrosiers Demske, RN 05/25/2017, 9:41 AM

## 2017-05-25 NOTE — ACP (Advance Care Planning) (Signed)
Assisted Ms Ellen Woods in completing her Advance Directives.  A copy was also placed in her chart.

## 2017-05-25 NOTE — Progress Notes (Signed)
Physical Therapy Treatment Patient Details Name: Ellen Woods Rice MRN: [id number] DOB: 04/08/1941 Today's Date: 05/25/2017    History of Present Illness Ellen Woods Conrey is a 77 y.o. female with medical history significant of COPD, coronary artery disease, diabetes mellitus type 2, dyspnea on exertion who presented to the emergency department with palpitations and facial swelling.  Patient's daughter and son are bedside and states that this morning patient began to have swelling on one side of her face and it appeared as though her heart was "beating out of her chest".  They used an at home blood pressure monitor and stated her blood pressure was reasonably controlled but her heart rate was in the 150s.  They gave patient a Xanax and stated that her heart rate came down and the swelling improved.  She presented to the emergency department for further evaluation of her tachycardia.  Patient reports that she has chronic dyspnea on exertion and has told this to her cardiologist who she saw in October 2018 but states that no further workup was ordered for her.  She does mention that she has a history of coronary artery disease with upon reviewing the note from the cardiologist in October she had a negative Lexiscan Myoview.  At that time her ejection fraction was noted to be 78%.  Patient reports no recent sick contacts except for her son and daughter who had gastroenteritis.  She voices that she occasionally has vomiting but this she attributes to her esophagus requiring stretching which she states [place] had done [place].  She denies fevers or chills, denies diarrhea or constipation, and denies chest pain.  She states that she thinks her palpitations began last night before she went to bed but she did not measure her heart rate at that time.  According to her problem list patient has a history of paroxysmal atrial fibrillation from 2013 however she cannot recall this incident.  There is a  cardiology note from June 2017 that voices that she had a Holter monitor that demonstrated premature atrial contractions and atrial tachycardia.  She does have a history of a PDA which is small and has been managed medically.    PT Comments    Patient demonstrates increased endurance/distance for gait training without loss of balance, on 2 LPM O2 with O2 sats between 96-97% when walking and tolerated sitting up in chair after therapy.  Patient will benefit from continued physical therapy in hospital and recommended venue below to increase strength, balance, endurance for safe ADLs and gait.   Follow Up Recommendations  SNF;Supervision/Assistance - 24 hour     Equipment Recommendations  None recommended by PT    Recommendations for Other Services       Precautions / Restrictions Precautions Precautions: Fall Restrictions Weight Bearing Restrictions: No    Mobility  Bed Mobility Overal bed mobility: Modified Independent Bed Mobility: Supine to Sit     Supine to sit: Modified independent (Device/Increase time)        Transfers Overall transfer level: Needs assistance Equipment used: Rolling walker (2 wheeled) Transfers: Sit to/from [employer];Stand Pivot Transfers Sit to Stand: Min guard Stand pivot transfers: Min guard          Ambulation/Gait Ambulation/Gait assistance: Min guard Ambulation Distance (Feet): 45 Feet Assistive device: Rolling walker (2 wheeled) Gait Pattern/deviations: Decreased step length - right;Decreased step length - left;Decreased stride length   Gait velocity interpretation: Below normal speed for age/gender General Gait Details: slightly labored cadence  without loss of balance, limited secondary to fatigue, on 2 LPM with O2 sats at 96-97%   Stairs            Wheelchair Mobility    Modified Rankin (Stroke Patients Only)       Balance Overall balance assessment: Needs assistance Sitting-balance support: Feet unsupported;No upper  extremity supported Sitting balance-Leahy Scale: Good     Standing balance support: Bilateral upper extremity supported;During functional activity Standing balance-Leahy Scale: Fair Standing balance comment: fair/good using RW                            Cognition Arousal/Alertness: Awake/alert Behavior During Therapy: WFL for tasks assessed/performed Overall Cognitive Status: Within Functional Limits for tasks assessed                                        Exercises General Exercises - Lower Extremity Ankle Circles/Pumps: Seated;AROM;Both;10 reps;Strengthening Long Arc Quad: Seated;AROM;Both;10 reps;Strengthening Hip Flexion/Marching: Seated;AROM;Both;10 reps;Strengthening    General Comments        Pertinent Vitals/Pain Pain Assessment: No/denies pain    Home Living                      Prior Function            PT Goals (current goals can now be found in the care plan section) Acute Rehab PT Goals Patient Stated Goal: return home after rehab PT Goal Formulation: With patient Time For Goal Achievement: 05/27/17 Potential to Achieve Goals: Good Progress towards PT goals: Progressing toward goals    Frequency    Min 3X/week      PT Plan Current plan remains appropriate    Co-evaluation              AM-PAC PT "6 Clicks" Daily Activity  Outcome Measure  Difficulty turning over in bed (including adjusting bedclothes, sheets and blankets)?: None Difficulty moving from lying on back to sitting on the side of the bed? : None Difficulty sitting down on and standing up from a chair with arms (e.g., wheelchair, bedside commode, etc,.)?: A Little Help needed moving to and from a bed to chair (including a wheelchair)?: A Little Help needed walking in hospital room?: A Little Help needed climbing 3-5 steps with a railing? : A Little 6 Click Score: 20    End of Session Equipment Utilized During Treatment: Oxygen Activity  Tolerance: Patient tolerated treatment well;Patient limited by fatigue Patient left: in chair;with call bell/phone within reach Nurse Communication: Mobility status PT Visit Diagnosis: Unsteadiness on feet (R26.81);Other abnormalities of gait and mobility (R26.89);Muscle weakness (generalized) (M62.81)     Time: 2841-3244 PT Time Calculation (min) (ACUTE ONLY): 27 min  Charges:  $Therapeutic Activity: 23-37 mins                    G Codes:       11:30 AM, Jun 19, 2017 Ocie Bob, MPT Physical Therapist with [facility] [phone] office [phone] mobile phone

## 2017-05-25 NOTE — Clinical Social Work Placement (Signed)
   CLINICAL SOCIAL WORK PLACEMENT  NOTE  Date:  05/25/2017  Patient Details  Name: Ellen Woods MRN: [id number] Date of Birth: 08/13/1940  Clinical Social Work is seeking post-discharge placement for this patient at the Skilled  Nursing Facility level of care (*CSW will initial, date and re-position this form in  chart as items are completed):  Yes   Patient/family provided with [facility] Clinical Social Work Department's list of facilities offering this level of care within the geographic area requested by the patient (or if unable, by the patient's family).  Yes   Patient/family informed of their freedom to choose among providers that offer the needed level of care, that participate in Medicare, Medicaid or managed care program needed by the patient, have an available bed and are willing to accept the patient.  Yes   Patient/family informed of [facility]'s ownership interest in [facility] and [facility], as well as of the fact that they are under no obligation to receive care at these facilities.  PASRR submitted to EDS on       PASRR number received on       Existing PASRR number confirmed on 05/22/17     FL2 transmitted to all facilities in geographic area requested by pt/family on       FL2 transmitted to all facilities within larger geographic area on 05/22/17     Patient informed that his/her managed care company has contracts with or will negotiate with certain facilities, including the following:        Yes   Patient/family informed of bed offers received.  Patient [facility] bed [facility]     Physician recommends and patient chooses bed at      [facility] on 05/25/17.  Patient to be transferred to facility by [facility]     Patient family notified on 05/25/17 of transfer.  Name of family member notified:  [name], daughter     PHYSICIAN       Additional Comment:  Facility notified. Discharge  clinicals sent.  SNF authorization received. Auth #[id number].  _______________________________________________ Annice NeedySettle, Doyle Kunath D, LCSW 05/25/2017, 12:28 PM

## 2017-05-25 NOTE — Progress Notes (Signed)
Pt discharged to [facility] today per Dr. Wallene HuhJohson. Pt's IV site D/C'd and WDL. Pt's VSS. [facility]. Verbalized understanding. Pt currently awaiting arrival of EMS to transport. Pt in stable condition.

## 2017-05-25 NOTE — Discharge Summary (Addendum)
Physician Discharge Summary  Ellen Woods:[id number] DOB: 1941-03-09 DOA: 05/05/2017  PCP: Dettinger, Elige Radon, MD  Admit date: 05/05/2017 Discharge date: 05/25/2017  Admitted From: Home  Disposition: SNF  Recommendations for Outpatient Follow-up:  1. Follow up with GI in 2 weeks for recheck and to arrange outpatient manometry studies 2. Please check blood sugar 4 times per day and As needed when not feeling well.  Please hold prandial insulin doses if patient does not eat at least 50% of meal.  3. SOFT FOODS DIET RECOMMENDED, no concentrated sweets or fruit juices except to treat a low blood glucose.  4. Please obtain BMP/CBC in one week 5. Please follow up on the following pending results: final culture data  PREDNISONE INSTRUCTIONS:  40 mg of prednisone daily for 3 MORE DAYS then 30 mg daily for 7 DAYS then 20 mg daily for 7 DAYS and then stop and she should have repeat chest x-ray or CT CHEST DONE in 3-4 weeks.   REDUCE INSULIN DOSES AS THE PREDNISONE IS WEANED DOWN.   Discharge Condition: STABLE   CODE STATUS: FULL    Brief Hospitalization Summary: Please see all hospital notes, images, labs for full details of the hospitalization.  HPI: Ellen Woods is a 77 y.o. female with medical history significant of COPD, coronary artery disease, diabetes mellitus type 2, dyspnea on exertion who presented to the emergency department with palpitations and facial swelling.  Patient's daughter and son are bedside and states that this morning patient began to have swelling on one side of her face and it appeared as though her heart was "beating out of her chest".  They used an at home blood pressure monitor and stated her blood pressure was reasonably controlled but her heart rate was in the 150s.  They gave patient a Xanax and stated that her heart rate came down and the swelling improved.  She presented to the emergency department for further evaluation of her tachycardia.  Patient reports  that she has chronic dyspnea on exertion and has told this to her cardiologist who she saw in October 2018 but states that no further workup was ordered for her.  She does mention that she has a history of coronary artery disease with upon reviewing the note from the cardiologist in October she had a negative Lexiscan Myoview.  At that time her ejection fraction was noted to be 78%.  Patient reports no recent sick contacts except for her son and daughter who had gastroenteritis.  She voices that she occasionally has vomiting but this she attributes to her esophagus requiring stretching which she states she had done at least 15 years ago in [place].  She denies fevers or chills, denies diarrhea or constipation, and denies chest pain.  She states that she thinks her palpitations began last night before she went to bed but she did not measure her heart rate at that time.  According to her problem list patient has a history of paroxysmal atrial fibrillation from 2013 however she cannot recall this incident.  There is a cardiology note from June 2017 that voices that she had a Holter monitor that demonstrated premature atrial contractions and atrial tachycardia.  She does have a history of a PDA which is small and has been managed medically.  ED Course: Patient was found to be in atrial fibrillation with rapid ventricular response.  Diltiazem IV was started.  Creatinine was 1.16 which is slightly elevated above her baseline and her blood glucose was  found to be 314.  All other laboratory data was within normal limit limits including a negative troponin and complete blood count.  Chest x-ray showed aortic calcifications vascular congestion with interstitial edema and bilateral pleural effusions right greater than left.She was also given a dose of IV Lasix in the emergency department.  Brief Narrative:  Ellen Woods a 77 y.o.femalewith medical history significant of COPD, coronary artery  disease, diabetes mellitus type 2, dyspnea on exertion who presented to the emergency department with palpitations and facial swelling. In the ER she was found to have A-fib with RVR and pulmonary edema. She has had a prolonged hospital stay. She underwent cardioversion on 1/8 and has been in NSR since.  She developed acute respiratory failure which we are still trying to resolve- see below.  She had complaints of dysphagia, nausea which were evaluated with an EGD and an esophagram.  Assessment & Plan:   Atrial fibrillation with RVR - underwent TEE cardioversion on 1/8 - CHA2DS2-VASc Score 5 - being managed by cardiology with Amiodarone and Lopressor - has been on Coumadin but was put on hold for EGD -  on NOAC now Pt is on eliquis BID for anticoagulation, monitoring for bleeding.  - HR has been controlled and stable.  Cardioversion was successful.  - Pt is on metoprolol 25 mg BID for rate control.   Nausea/ vomiting- esophageal stricture - occasional dysphagia and regurgitation of food per daughter  - upper GI series shows severe impairment of esophageal motility, a narrowing at the GE junction, a hiatal hernia and possible GOO vs gastroparesis - she needed esophageal dilation about 15 yrs ago  - 1/11- EGD did not reveal any stricture- GI junction was "empirically dilated"  In case she has achalasia  GI is recommending outpatient manometry study.  Arrange outpatient GI follow up.   Dyspnea/ acute resp distress- due to acute d CHF and or b/l lobar pneumonia - 1/9-  RR is in 20s on my exam and there are crackles at bases -  - per patient, she has had a cough for about 1 wk - CXR >>- is not clear on whether this is pneumonia or CHF  - started on lasix and Levaquin  - 1/10- mild improvement noted- she states cough is gone but she is still hypoxic- cont to diurese and treat with Levaquin and attempt to wean O2 1/11- symptoms appear to be improving- she is not dyspneic today- wean O2-  cont diuretics and antibiotics 1/12- repeat CXR shows bilateral multifocal pneumonia which is quite extensive in the right lung base- ? If she aspirated in relation to above esophageal dysmotility- will change Levaquin to Zosyn today- she still has extensive crackles on exam and is still requiring O2- lasix held  - 1/13- cont current management with anibiotics- wean O2 as able - 1/14- continues to be hypoxic- WBC improving- have obtained a CT of the chest which reveals a mod-large right sided pleural effusion which was not seen on the CXR and multiple infiltrates- difficult to tell if this is CHF or an atypical infection or autoimmune - s/p thoracentesis ...530 cc of yellow fluid obtained- pleural fluid results reveal a transudate and lasix has been resumed - 1/14- given a dose of Levaquin in case we are missing an atypical organism with Zosyn -- cont to follow- have asked for pulmonary consult  - continue antibiotics, prednisone 40 mg daily x 1 week, 30 mg daily x 1 week, 20 mg daily  x 1 week.  Repeat CXR or CT chest in 3-4 weeks recommended.  Awaiting for insurance authorization for discharge to SNF bed.  Hypotension - Resolved now.   Pt was given bolus of IVFs and BP has improved.    Severe constipation  - on Linzess, last BM 1/20    Chronic diastolic heart failure  - stable,  Lasix resumed. Adjust outpatient as needed.   CKD 3  - stable, following.   Hypokalemia - ordered replacement, follow. Mg 1.9    Brittle, insulin dependent Diabetes type 2  - Diabetes if very Brittle.   - continue to monitor closely as her diet intake is fluctuating, high risk for hypoglycemia CBG (last 3)  RecentLabs(last2labs)   CBG (last 3)  Recent Labs    05/24/17 1121 05/24/17 1640 05/25/17 0751  GLUCAP 289* 156* 190*      Hypothyroidism - continue home dose Synthroid    DVT prophylaxis: SCDs Code Status: Full code Family Communication: daughter Disposition Plan: SNF  PLACEMENT Consultants:   Cardiology  GI Procedures:  2 D ECHO Left ventricle: The cavity size was normal. Wall thickness wasnormal. Systolic function was normal. The estimated ejection fraction was in the range of 60% to 65%. Wall motion was normal;there were no regional wall motion abnormalities. - Aortic valve: Mildly calcified annulus. Trileaflet; normal thickness leaflets. Valve area (VTI): 1.87 cm^2. - Mitral valve: There was mild regurgitation. - Left atrium: The atrium was moderately dilated. - Right atrium: The atrium was mildly dilated. - Atrial septum: No defect or patent foramen ovale was identified. - Pulmonary arteries: Systolic pressure was mildly to moderatelyincreased. PA peak pressure: 40 mm Hg (S).  TEE cardioversion  Discharge Diagnoses:  Principal Problem:   Atrial fibrillation with RVR (HCC) Active Problems:   Mixed hyperlipidemia   Essential hypertension   Coronary artery disease involving native coronary artery of native heart with angina pectoris (HCC)   Chronic diastolic heart failure (HCC)   Diabetes type 2, uncontrolled (HCC)   Hypothyroidism   Uncontrolled type 2 diabetes mellitus with hyperglycemia (HCC)   Atrial fibrillation, new onset (HCC)   Congestive heart failure (HCC)   Intractable cyclical vomiting with nausea   Dysphagia   Esophageal pain   Hyperglycemia   Hypoglycemia   N&V (nausea and vomiting)  Discharge Instructions:  Allergies as of 05/25/2017      Reactions   Codeine    REACTION: nausea   Invokana [canagliflozin]    Naproxen Other (See Comments)   Abdominal pain   Azithromycin Other (See Comments)   Hospital reaction      Medication List    STOP taking these medications   aspirin 81 MG tablet   lisinopril 40 MG tablet Commonly known as:  PRINIVIL,ZESTRIL     TAKE these medications   acetaminophen 325 MG tablet Commonly known as:  TYLENOL Take 2 tablets (650 mg total) by mouth every 6 (six) hours as  needed.   ALPRAZolam 0.5 MG tablet Commonly known as:  XANAX Take 1 tablet (0.5 mg total) by mouth 2 (two) times daily as needed.   amiodarone 200 MG tablet Commonly known as:  PACERONE Take 1 tablet (200 mg total) by mouth daily.   amLODipine 2.5 MG tablet Commonly known as:  NORVASC Take 1 tablet (2.5 mg total) by mouth daily. For blood pressure What changed:    medication strength  how much to take   apixaban 2.5 MG Tabs tablet Commonly known as:  ELIQUIS Take 1 tablet (  2.5 mg total) by mouth 2 (two) times daily.   Calcium Carb-Cholecalciferol 500-600 MG-UNIT Tabs Commonly known as:  CALCIUM 500 + D3 Take 1 tablet by mouth daily.   DULoxetine 30 MG capsule Commonly known as:  CYMBALTA Take 1 capsule (30 mg total) by mouth daily.   feeding supplement (GLUCERNA SHAKE) Liqd Take 237 mLs by mouth 2 (two) times daily between meals.   furosemide 20 MG tablet Commonly known as:  LASIX Take 1 tablet (20 mg total) by mouth every 3 (three) days. Take one tablet by mouth as needed Start taking on:  05/28/2017 What changed:    medication strength  how to take this  when to take this   insulin aspart 100 UNIT/ML FlexPen Commonly known as:  NOVOLOG FLEXPEN Inject 3 Units into the skin 3 (three) times daily before meals. ONLY GIVE IF EATS 50% OR MORE OF MEAL. What changed:  See the new instructions.   Insulin Glargine 100 UNIT/ML Solostar Pen Commonly known as:  LANTUS SOLOSTAR Inject 8 Units into the skin daily with breakfast. What changed:  how much to take   levothyroxine 50 MCG tablet Commonly known as:  SYNTHROID, LEVOTHROID TAKE 1 TABLET EVERY DAY BEFORE BREAKFAST   linaclotide 290 MCG Caps capsule Commonly known as:  LINZESS Take 1 capsule (290 mcg total) by mouth daily before breakfast. Start taking on:  05/26/2017   metoCLOPramide 5 MG tablet Commonly known as:  REGLAN Take 1 tablet (5 mg total) by mouth 3 (three) times daily before meals.   metoprolol  tartrate 25 MG tablet Commonly known as:  LOPRESSOR TAKE 1 TABLET TWICE DAILY   NITROSTAT 0.4 MG SL tablet Generic drug:  nitroGLYCERIN DISSOLVE  1 TABLET  UNDER THE TONGUE EVERY 5 MINUTES AS NEEDED FOR CHEST PAIN   ondansetron 4 MG tablet Commonly known as:  ZOFRAN Take 1 tablet (4 mg total) by mouth every 8 (eight) hours as needed for nausea or vomiting.   pantoprazole 40 MG tablet Commonly known as:  PROTONIX Take 1 tablet (40 mg total) by mouth 2 (two) times daily before a meal.   polyethylene glycol packet Commonly known as:  MIRALAX / GLYCOLAX Take 17 g by mouth daily as needed for mild constipation.   potassium chloride SA 20 MEQ tablet Commonly known as:  KLOR-CON M20 Take 1 tablet (20 mEq total) by mouth every 3 (three) days. Start taking on:  05/28/2017 What changed:    how much to take  when to take this   predniSONE 10 MG tablet Commonly known as:  DELTASONE TAKE 40 MG DAILY FOR 3 DAYS, THEN 30 MG DAILY FOR 7 DAYS, THEN 20 MG DAILY FOR 7 DAYS THEN STOP Start taking on:  05/26/2017   PROLIA 60 MG/ML Soln injection Generic drug:  denosumab Inject 60 mg into the skin every 6 (six) months. To be administered 08/20/2016   rosuvastatin 5 MG tablet Commonly known as:  CRESTOR Take 1 tablet (5 mg total) daily by mouth.            Durable Medical Equipment  (From admission, onward)        Start     Ordered   05/15/17 1203  For home use only DME oxygen  Once    Question Answer Comment  Mode or (Route) Nasal cannula   Liters per Minute 4   Frequency Continuous (stationary and portable oxygen unit needed)   Oxygen delivery system Gas      05/15/17 1203  Contact information for follow-up providers    [facility] Follow up.   Specialty:  Home Health Services Contact information: [address] [place] [address] [phone]        Corbin Adeourk, Robert M, MD. Schedule an appointment as soon as possible for a visit in 2  week(s).   Specialty:  Gastroenterology Why:  HOSPITAL FOLLOW UP  Contact information: [address] [place] [address] [phone]            Contact information for after-discharge care    [facility].   Service:  Skilled Nursing Contact information: [address] [place] [id number] [phone]                 Allergies  Allergen Reactions  . Codeine     REACTION: nausea  . Invokana [Canagliflozin]   . Naproxen Other (See Comments)    Abdominal pain  . Azithromycin Other (See Comments)    Hospital reaction   Allergies as of 05/25/2017      Reactions   Codeine    REACTION: nausea   Invokana [canagliflozin]    Naproxen Other (See Comments)   Abdominal pain   Azithromycin Other (See Comments)   Hospital reaction      Medication List    STOP taking these medications   aspirin 81 MG tablet   lisinopril 40 MG tablet Commonly known as:  PRINIVIL,ZESTRIL     TAKE these medications   acetaminophen 325 MG tablet Commonly known as:  TYLENOL Take 2 tablets (650 mg total) by mouth every 6 (six) hours as needed.   ALPRAZolam 0.5 MG tablet Commonly known as:  XANAX Take 1 tablet (0.5 mg total) by mouth 2 (two) times daily as needed.   amiodarone 200 MG tablet Commonly known as:  PACERONE Take 1 tablet (200 mg total) by mouth daily.   amLODipine 2.5 MG tablet Commonly known as:  NORVASC Take 1 tablet (2.5 mg total) by mouth daily. For blood pressure What changed:    medication strength  how much to take   apixaban 2.5 MG Tabs tablet Commonly known as:  ELIQUIS Take 1 tablet (2.5 mg total) by mouth 2 (two) times daily.   Calcium Carb-Cholecalciferol 500-600 MG-UNIT Tabs Commonly known as:  CALCIUM 500 + D3 Take 1 tablet by mouth daily.   DULoxetine 30 MG capsule Commonly known as:  CYMBALTA Take 1 capsule (30 mg total) by mouth daily.   feeding supplement (GLUCERNA  SHAKE) Liqd Take 237 mLs by mouth 2 (two) times daily between meals.   furosemide 20 MG tablet Commonly known as:  LASIX Take 1 tablet (20 mg total) by mouth every 3 (three) days. Take one tablet by mouth as needed Start taking on:  05/28/2017 What changed:    medication strength  how to take this  when to take this   insulin aspart 100 UNIT/ML FlexPen Commonly known as:  NOVOLOG FLEXPEN Inject 3 Units into the skin 3 (three) times daily before meals. ONLY GIVE IF EATS 50% OR MORE OF MEAL. What changed:  See the new instructions.   Insulin Glargine 100 UNIT/ML Solostar Pen Commonly known as:  LANTUS SOLOSTAR Inject 8 Units into the skin daily with breakfast. What changed:  how much to take   levothyroxine 50 MCG tablet Commonly known as:  SYNTHROID, LEVOTHROID TAKE 1 TABLET EVERY DAY BEFORE BREAKFAST   linaclotide 290 MCG Caps capsule  Commonly known as:  LINZESS Take 1 capsule (290 mcg total) by mouth daily before breakfast. Start taking on:  05/26/2017   metoCLOPramide 5 MG tablet Commonly known as:  REGLAN Take 1 tablet (5 mg total) by mouth 3 (three) times daily before meals.   metoprolol tartrate 25 MG tablet Commonly known as:  LOPRESSOR TAKE 1 TABLET TWICE DAILY   NITROSTAT 0.4 MG SL tablet Generic drug:  nitroGLYCERIN DISSOLVE  1 TABLET  UNDER THE TONGUE EVERY 5 MINUTES AS NEEDED FOR CHEST PAIN   ondansetron 4 MG tablet Commonly known as:  ZOFRAN Take 1 tablet (4 mg total) by mouth every 8 (eight) hours as needed for nausea or vomiting.   pantoprazole 40 MG tablet Commonly known as:  PROTONIX Take 1 tablet (40 mg total) by mouth 2 (two) times daily before a meal.   polyethylene glycol packet Commonly known as:  MIRALAX / GLYCOLAX Take 17 g by mouth daily as needed for mild constipation.   potassium chloride SA 20 MEQ tablet Commonly known as:  KLOR-CON M20 Take 1 tablet (20 mEq total) by mouth every 3 (three) days. Start taking on:  05/28/2017 What  changed:    how much to take  when to take this   predniSONE 10 MG tablet Commonly known as:  DELTASONE TAKE 40 MG DAILY FOR 3 DAYS, THEN 30 MG DAILY FOR 7 DAYS, THEN 20 MG DAILY FOR 7 DAYS THEN STOP Start taking on:  05/26/2017   PROLIA 60 MG/ML Soln injection Generic drug:  denosumab Inject 60 mg into the skin every 6 (six) months. To be administered 08/20/2016   rosuvastatin 5 MG tablet Commonly known as:  CRESTOR Take 1 tablet (5 mg total) daily by mouth.            Durable Medical Equipment  (From admission, onward)        Start     Ordered   05/15/17 1203  For home use only DME oxygen  Once    Question Answer Comment  Mode or (Route) Nasal cannula   Liters per Minute 4   Frequency Continuous (stationary and portable oxygen unit needed)   Oxygen delivery system Gas      05/15/17 1203      Procedures/Studies: Dg Chest 1 View  Result Date: 05/18/2017 CLINICAL DATA:  RIGHT pleural effusion post thoracentesis EXAM: CHEST 1 VIEW COMPARISON:  Chest radiograph 05/16/2017, CT chest 05/18/2017 FINDINGS: Enlargement of cardiac silhouette with pulmonary vascular congestion. Diffuse BILATERAL pulmonary infiltrates, slightly improved in upper lobes. Decreased RIGHT pleural effusion and basilar atelectasis post thoracentesis. No pneumothorax. Bones demineralized. Retained contrast in colon from recent upper GI exam. IMPRESSION: No pneumothorax following RIGHT thoracentesis. Persistent BILATERAL pulmonary infiltrates, improved in upper lobes since 05/16/2017. Electronically Signed   By: Ulyses Southward M.D.   On: 05/18/2017 12:01   Dg Chest 2 View  Result Date: 05/13/2017 CLINICAL DATA:  Shortness of Breath EXAM: CHEST  2 VIEW COMPARISON:  May 09, 2017 FINDINGS: There is stable cardiomegaly with mild pulmonary venous hypertension. There is aortic atherosclerosis. There is a loculated right pleural effusion with a somewhat equivocal left pleural effusion. There is patchy airspace  opacity bilaterally, overall increased from recent study. There does not appear to be appreciable interstitial edema. No adenopathy evident.  No bone lesions. IMPRESSION: There is evidence of underlying pulmonary vascular congestion. Opacification bilaterally has an appearance more suggestive of multifocal pneumonia than pulmonary edema, although pulmonary edema could present in this manner. Both  pulmonary edema and pneumonia may present concurrently. There is aortic atherosclerosis. No adenopathy evident. Partially loculated right pleural effusion with equivocal left pleural effusion noted. Aortic Atherosclerosis (ICD10-I70.0). Electronically Signed   By: Bretta Bang III M.D.   On: 05/13/2017 11:39   Ct Chest Wo Contrast  Result Date: 05/18/2017 CLINICAL DATA:  77 year old female with hypoxia and respiratory failure of unknown etiology. COPD. Patient presented initially in atrial fibrillation with rapid ventricular response. Underwent cardioversion on 05/12/2017. Recent low-grade fever. CHF versus pneumonia. On empiric antibiotics. EXAM: CT CHEST WITHOUT CONTRAST TECHNIQUE: Multidetector CT imaging of the chest was performed following the standard protocol without IV contrast. COMPARISON:  Chest radiographs 05/16/2017 and earlier. CTA Abdomen and Pelvis 03/14/2014. FINDINGS: Cardiovascular: Chronic mild cardiomegaly. Areas of chronic pericardial thickening and confluent pericardial calcification with no pericardial effusion. Calcified coronary artery atherosclerosis and/or stents. Vascular patency is not evaluated in the absence of IV contrast. Mediastinum/Nodes: Mildly fluid distended thoracic esophagus. Small to moderate size gastric hiatal hernia appears progressed since 2,015. Small bilateral mediastinal lymph nodes are within normal limits. No mediastinal lymphadenopathy. Lungs/Pleura: Moderate to large layering right pleural effusion. Trace left pleural fluid. Bilateral scattered confluent pulmonary  opacity is mostly ground-glass type and peribronchial, although areas of peripheral/subpleural upper lobe involvement are noted. No definite air bronchograms or consolidation. Atelectatic appearance of the trachea and central pulmonary airways. Small volume of retained secretions suspected in the trachea on series 4, image 33. Upper Abdomen: Retained barium type contrast in the large bowel flexures which results in streak artifact through the upper abdomen. Chronic calcified granulomas in the liver and spleen. Although there is a paucity of thoracic aortic calcified atherosclerosis there is moderate to extensive calcified plaque in the upper abdominal aorta and major mesenteric branches. Possible SMA stent. No upper abdominal free fluid. 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 Osteopenia. Mild pectus excavatum. No acute osseous abnormality identified. IMPRESSION: 1. Moderate to large layering right pleural effusion. 2. Diffuse bilateral confluent but nonspecific pulmonary opacity, mostly ground-glass type. The appearance more resembles widespread pulmonary infection or inflammation rather than asymmetric pulmonary edema. Consider viral/atypical pneumonia as well as noninfectious inflammatory processes - such as pulmonary vasculitis. 3. Chronic calcified pericarditis. No pericardial effusion. Mild cardiomegaly. 4. Mild fluid distension of the thoracic esophagus with moderate hiatal hernia which has progressed since 2015. 5. Calcified coronary artery atherosclerosis and/or stents. Abdominal Aortic Atherosclerosis (ICD10-I70.0). Electronically Signed   By: Odessa Fleming M.D.   On: 05/18/2017 09:38   Dg Chest Port 1 View  Result Date: 05/21/2017 CLINICAL DATA:  Acute on chronic respiratory failure EXAM: PORTABLE CHEST 1 VIEW COMPARISON:  05/18/2017 FINDINGS: Diffuse bilateral airspace disease  unchanged. Small bilateral effusions and bibasilar atelectasis unchanged. Cardiac enlargement. IMPRESSION: Diffuse bilateral airspace disease and small bilateral effusions unchanged. Electronically Signed   By: Marlan Palau M.D.   On: 05/21/2017 08:59   Dg Chest Port 1 View  Result Date: 05/16/2017 CLINICAL DATA:  Shortness of breath.  Hypoxia. EXAM: PORTABLE CHEST 1 VIEW COMPARISON:  Two-view chest x-ray 05/13/2016. FINDINGS: The heart is enlarged. Diffuse interstitial and airspace disease is again seen. Consolidation is worse on the right than left base. Bilateral effusions are suspected. IMPRESSION: 1. Bilateral interstitial and airspace disease compatible with multi lobar pneumonia. 2. Bilateral pleural effusions. 3. Underlying edema is likely present as well. Electronically Signed   By: Marin Roberts M.D.   On:  05/16/2017 08:34   Dg Chest Port 1 View  Result Date: 05/09/2017 CLINICAL DATA:  Injured fibrillation, cough, fever EXAM: PORTABLE CHEST 1 VIEW COMPARISON:  05/05/2017 FINDINGS: Bilateral diffuse mild interstitial thickening, right greater than left. Patchy right lung alveolar airspace opacities. Small right pleural effusion. Small left pleural effusion. No pneumothorax. Stable cardiomegaly. No acute osseous abnormality. IMPRESSION: Persistent CHF without significant interval change compared with 05/05/2017. Electronically Signed   By: Elige Ko   On: 05/09/2017 14:00   Dg Chest Port 1 View  Result Date: 05/05/2017 CLINICAL DATA:  Chest pain EXAM: PORTABLE CHEST 1 VIEW COMPARISON:  10/12/2016 FINDINGS: Cardiac shadow remains enlarged. Aortic calcifications are noted. Vascular congestion with interstitial edema and bilateral pleural effusions right greater than left is noted. No bony abnormality is seen. IMPRESSION: Changes of CHF. Electronically Signed   By: Alcide Clever M.D.   On: 05/05/2017 13:54   Dg Ugi W/high Density W/kub  Result Date: 05/14/2017 CLINICAL DATA:  Dysphagia,  feels like something is stuck in her throat when she tries to eat, nausea, vomiting; history diabetes mellitus, COPD, coronary artery disease, essential hypertension EXAM: UPPER GI SERIES WITH KUB TECHNIQUE: After obtaining a scout radiograph a routine upper GI series was performed using thin and high density barium. Patient also swallowed a 12.5 mm diameter barium tablet FLUOROSCOPY TIME:  Fluoroscopy Time:  3 minutes 0 seconds Radiation Exposure Index (if provided by the fluoroscopic device): 55.2 mGy Number of Acquired Spot Images: 3 plus multiple fluoroscopic screen captures COMPARISON:  None FINDINGS: Normal bowel gas pattern on scout image. Diffuse osseous demineralization with orthopedic hardware proximal LEFT femur. Scattered atherosclerotic calcifications. Calcified granulomata within spleen. RIGHT upper quadrant calcification likely a 10 mm diameter gallstone. Diffuse impairment of esophageal motility with incomplete clearance of barium by primary peristaltic waves. Prolonged thoracic retention of contrast in the thoracic esophagus. Numerous secondary and tertiary waves. 12.5 mm diameter barium tablet obstructed at the gastroesophageal junction and could not pass into the stomach. Smooth appearance of esophageal walls without irregularity or ulceration. Stomach distends normally. Atrophic rugal folds. No discrete gastric mass or ulceration. Small to moderate-sized hiatal hernia. Slow emptying of contrast from the stomach. Duodenal bulb and sweep grossly normal appearance. Incidentally noted diverticula at the second and third portions of the duodenum. Visualized proximal jejunum unremarkable. IMPRESSION: Significant diffuse impairment of esophageal motility. Stricture at the gastroesophageal junction which obstructed the 12.5 mm diameter barium tablet. Small to moderate-sized hiatal hernia. Incidentally noted duodenal diverticula. Slow emptying of contrast from the stomach without outlet obstruction,  question component of diabetic gastroparesis. Electronically Signed   By: Ulyses Southward M.D.   On: 05/14/2017 14:18   US Thoracentesis Asp Pleural Space W/img Guide  Result Date: 05/18/2017 INDICATION: RIGHT pleural effusion EXAM: ULTRASOUND GUIDED DIAGNOSTIC AND THERAPEUTIC RIGHT THORACENTESIS MEDICATIONS: None. COMPLICATIONS: None immediate. PROCEDURE: Procedure, benefits, and risks of procedure were discussed with patient. Written informed consent for procedure was obtained. Time out protocol followed. Pleural effusion localized by ultrasound at the posterior RIGHT hemithorax. Skin prepped and draped in usual sterile fashion. Skin and soft tissues anesthetized with 10 mL of 1% lidocaine. 8 French thoracentesis catheter placed into the RIGHT pleural space. 530 mL of clear yellow fluid aspirated by syringe pump. Procedure tolerated well by patient without immediate complication. FINDINGS: A total of approximately 530 mL of RIGHT pleural fluid was removed. Samples were sent to the laboratory as requested by the clinical team. IMPRESSION: Successful ultrasound guided RIGHT thoracentesis yielding 530 mL  of pleural fluid. Electronically Signed   By: Ulyses Southward M.D.   On: 05/18/2017 11:42      Subjective: PT READY TO DISCHARGE, WANTS TO EAT BREAKFAST, NO COMPLAINTS. AGREES TO SNF REHAB.   Discharge Exam: Vitals:   05/24/17 2109 05/25/17 0510  BP: (!) 104/41 (!) 123/52  Pulse: 63 (!) 59  Resp: 18 18  Temp: 98.3 F (36.8 C) 97.8 F (36.6 C)  SpO2: 99% 97%   Vitals:   05/24/17 1532 05/24/17 2109 05/25/17 0510 05/25/17 0600  BP: (!) 106/41 (!) 104/41 (!) 123/52   Pulse: 64 63 (!) 59   Resp: 18 18 18    Temp: 98.4 F (36.9 C) 98.3 F (36.8 C) 97.8 F (36.6 C)   TempSrc: Oral Oral Oral   SpO2: 97% 99% 97%   Weight:    63.9 kg (140 lb 14 oz)  Height:       General exam: Appears comfortable, sitting up in chair.  NAD.  HEENT: PERRLA, oral mucosa moist, no sclera icterus or thrush Respiratory  system: BBS clear to auscultation.  Cardiovascular system: S1 & S2 heard.  No murmurs, rubs, gallops.  Gastrointestinal system: Abdomen soft, non-tender, nondistended. Normal bowel sound. No organomegaly. Central nervous system: Alert and oriented. No focal neurological deficits. Extremities: No cyanosis, clubbing or edema. Skin: No rashes or ulcers. Psychiatry:  Flat affect.    The results of significant diagnostics from this hospitalization (including imaging, microbiology, ancillary and laboratory) are listed below for reference.     Microbiology: Recent Results (from the past 240 hour(s))  Respiratory Panel by PCR     Status: None   Collection Time: 05/18/17 10:12 AM  Result Value Ref Range Status   Adenovirus NOT DETECTED NOT DETECTED Final   Coronavirus 229E NOT DETECTED NOT DETECTED Final   Coronavirus HKU1 NOT DETECTED NOT DETECTED Final   Coronavirus NL63 NOT DETECTED NOT DETECTED Final   Coronavirus OC43 NOT DETECTED NOT DETECTED Final   Metapneumovirus NOT DETECTED NOT DETECTED Final   Rhinovirus / Enterovirus NOT DETECTED NOT DETECTED Final   Influenza A NOT DETECTED NOT DETECTED Final   Influenza B NOT DETECTED NOT DETECTED Final   Parainfluenza Virus 1 NOT DETECTED NOT DETECTED Final   Parainfluenza Virus 2 NOT DETECTED NOT DETECTED Final   Parainfluenza Virus 3 NOT DETECTED NOT DETECTED Final   Parainfluenza Virus 4 NOT DETECTED NOT DETECTED Final   Respiratory Syncytial Virus NOT DETECTED NOT DETECTED Final   Bordetella pertussis NOT DETECTED NOT DETECTED Final   Chlamydophila pneumoniae NOT DETECTED NOT DETECTED Final   Mycoplasma pneumoniae NOT DETECTED NOT DETECTED Final  Gram stain     Status: None   Collection Time: 05/18/17 10:50 AM  Result Value Ref Range Status   Specimen Description PLEURAL  Final   Special Requests NONE  Final   Gram Stain   Final    NO ORGANISMS SEEN WBC PRESENT,BOTH PMN AND MONONUCLEAR CYTOSPIN SMEAR    Report Status 05/18/2017  FINAL  Final     Labs: BNP (last 3 results) Recent Labs    05/13/17 0428 05/18/17 0446  BNP 373.0* 429.0*   Basic Metabolic Panel: Recent Labs  Lab 05/19/17 0443 05/20/17 0622 05/21/17 0504 05/22/17 0512  NA 141 139 138 138  K 3.3* 3.0* 3.7 3.7  CL 99* 95* 95* 100*  CO2 30 29 29 26   GLUCOSE 159* 255* 240* 238*  BUN 48* 45* 55* 74*  CREATININE 1.79* 1.99* 2.33* 2.93*  CALCIUM 7.2*  7.1* 7.3* 6.9*  MG  --   --  1.9  --    Liver Function Tests: No results for input(s): AST, ALT, ALKPHOS, BILITOT, PROT, ALBUMIN in the last 168 hours. No results for input(s): LIPASE, AMYLASE in the last 168 hours. No results for input(s): AMMONIA in the last 168 hours. CBC: Recent Labs  Lab 05/19/17 0443 05/20/17 0622 05/21/17 0504  WBC 11.5* 10.1 11.0*  HGB 9.9* 10.4* 10.7*  HCT 32.4* 33.2* 33.4*  MCV 99.1 99.1 97.4  PLT 312 294 328   Cardiac Enzymes: No results for input(s): CKTOTAL, CKMB, CKMBINDEX, TROPONINI in the last 168 hours. BNP: Invalid input(s): POCBNP CBG: Recent Labs  Lab 05/23/17 2038 05/24/17 0744 05/24/17 1121 05/24/17 1640 05/25/17 0751  GLUCAP 136* 326* 289* 156* 190*   D-Dimer No results for input(s): DDIMER in the last 72 hours. Hgb A1c No results for input(s): HGBA1C in the last 72 hours. Lipid Profile No results for input(s): CHOL, HDL, LDLCALC, TRIG, CHOLHDL, LDLDIRECT in the last 72 hours. Thyroid function studies No results for input(s): TSH, T4TOTAL, T3FREE, THYROIDAB in the last 72 hours.  Invalid input(s): FREET3 Anemia work up No results for input(s): VITAMINB12, FOLATE, FERRITIN, TIBC, IRON, RETICCTPCT in the last 72 hours. Urinalysis    Component Value Date/Time   COLORURINE YELLOW 03/14/2014 1227   APPEARANCEUR CLEAR 03/14/2014 1227   LABSPEC 1.015 03/14/2014 1227   PHURINE 5.0 03/14/2014 1227   GLUCOSEU 100 (A) 03/14/2014 1227   HGBUR NEGATIVE 03/14/2014 1227   BILIRUBINUR neg 06/13/2015 1710   KETONESUR NEGATIVE 03/14/2014  1227   PROTEINUR neg 06/13/2015 1710   PROTEINUR NEGATIVE 03/14/2014 1227   UROBILINOGEN negative 06/13/2015 1710   UROBILINOGEN 0.2 03/14/2014 1227   NITRITE neg 06/13/2015 1710   NITRITE NEGATIVE 03/14/2014 1227   LEUKOCYTESUR Negative 06/13/2015 1710   Sepsis Labs Invalid input(s): PROCALCITONIN,  WBC,  LACTICIDVEN Microbiology Recent Results (from the past 240 hour(s))  Respiratory Panel by PCR     Status: None   Collection Time: 05/18/17 10:12 AM  Result Value Ref Range Status   Adenovirus NOT DETECTED NOT DETECTED Final   Coronavirus 229E NOT DETECTED NOT DETECTED Final   Coronavirus HKU1 NOT DETECTED NOT DETECTED Final   Coronavirus NL63 NOT DETECTED NOT DETECTED Final   Coronavirus OC43 NOT DETECTED NOT DETECTED Final   Metapneumovirus NOT DETECTED NOT DETECTED Final   Rhinovirus / Enterovirus NOT DETECTED NOT DETECTED Final   Influenza A NOT DETECTED NOT DETECTED Final   Influenza B NOT DETECTED NOT DETECTED Final   Parainfluenza Virus 1 NOT DETECTED NOT DETECTED Final   Parainfluenza Virus 2 NOT DETECTED NOT DETECTED Final   Parainfluenza Virus 3 NOT DETECTED NOT DETECTED Final   Parainfluenza Virus 4 NOT DETECTED NOT DETECTED Final   Respiratory Syncytial Virus NOT DETECTED NOT DETECTED Final   Bordetella pertussis NOT DETECTED NOT DETECTED Final   Chlamydophila pneumoniae NOT DETECTED NOT DETECTED Final   Mycoplasma pneumoniae NOT DETECTED NOT DETECTED Final  Gram stain     Status: None   Collection Time: 05/18/17 10:50 AM  Result Value Ref Range Status   Specimen Description PLEURAL  Final   Special Requests NONE  Final   Gram Stain   Final    NO ORGANISMS SEEN WBC PRESENT,BOTH PMN AND MONONUCLEAR CYTOSPIN SMEAR    Report Status 05/18/2017 FINAL  Final   Time coordinating discharge: 45 mins  SIGNED:  Standley Dakins, MD  [facility] 05/25/2017, 9:41 AM Pager [phone]  If 7PM-7AM, please contact night-coverage [email or web address] Password  [email or web address]

## 2017-05-29 NOTE — Telephone Encounter (Signed)
The patient was discharged on 05/25/17 to a nursing home. Family member declines to schedule. The patient is recovering from pneumonia.

## 2017-05-29 NOTE — Telephone Encounter (Signed)
Noted. Thanks! We will be seeing her in March and decide where to go from here. Appreciate your help.

## 2017-05-30 DIAGNOSIS — I5041 Acute combined systolic (congestive) and diastolic (congestive) heart failure: Secondary | ICD-10-CM | POA: Diagnosis not present

## 2017-05-30 DIAGNOSIS — M797 Fibromyalgia: Secondary | ICD-10-CM | POA: Diagnosis not present

## 2017-05-30 DIAGNOSIS — J189 Pneumonia, unspecified organism: Secondary | ICD-10-CM | POA: Diagnosis not present

## 2017-05-30 DIAGNOSIS — J9601 Acute respiratory failure with hypoxia: Secondary | ICD-10-CM | POA: Diagnosis not present

## 2017-05-30 DIAGNOSIS — I482 Chronic atrial fibrillation: Secondary | ICD-10-CM | POA: Diagnosis not present

## 2017-05-30 DIAGNOSIS — R069 Unspecified abnormalities of breathing: Secondary | ICD-10-CM | POA: Diagnosis not present

## 2017-05-30 DIAGNOSIS — K5909 Other constipation: Secondary | ICD-10-CM | POA: Diagnosis not present

## 2017-05-30 DIAGNOSIS — B370 Candidal stomatitis: Secondary | ICD-10-CM | POA: Diagnosis not present

## 2017-05-30 DIAGNOSIS — R0902 Hypoxemia: Secondary | ICD-10-CM | POA: Diagnosis not present

## 2017-05-30 DIAGNOSIS — R0602 Shortness of breath: Secondary | ICD-10-CM | POA: Diagnosis not present

## 2017-05-30 DIAGNOSIS — N19 Unspecified kidney failure: Secondary | ICD-10-CM | POA: Diagnosis not present

## 2017-05-30 DIAGNOSIS — I110 Hypertensive heart disease with heart failure: Secondary | ICD-10-CM | POA: Diagnosis not present

## 2017-05-30 DIAGNOSIS — N289 Disorder of kidney and ureter, unspecified: Secondary | ICD-10-CM | POA: Diagnosis not present

## 2017-05-30 DIAGNOSIS — J181 Lobar pneumonia, unspecified organism: Secondary | ICD-10-CM | POA: Diagnosis not present

## 2017-05-30 DIAGNOSIS — J15212 Pneumonia due to Methicillin resistant Staphylococcus aureus: Secondary | ICD-10-CM | POA: Diagnosis not present

## 2017-05-30 DIAGNOSIS — A419 Sepsis, unspecified organism: Secondary | ICD-10-CM | POA: Diagnosis not present

## 2017-07-03 DEATH — deceased

## 2017-07-10 ENCOUNTER — Ambulatory Visit: Payer: Medicare HMO | Admitting: Gastroenterology

## 2017-07-15 ENCOUNTER — Ambulatory Visit: Payer: Medicare HMO | Admitting: "Endocrinology

## 2018-09-04 IMAGING — CT CT CHEST W/O CM
2 of 3 series · 15 of 36 positions shown, 18 images · non-contrast
Comparison: Chest radiographs 05/16/2017 and earlier. CTA Abdomen
and Pelvis 03/14/2014.

CLINICAL DATA: 76-year-old female with hypoxia and respiratory
failure of unknown etiology. COPD. Patient presented initially in
atrial fibrillation with rapid ventricular response. Underwent
cardioversion on 05/12/2017. Recent low-grade fever. CHF versus
pneumonia. On empiric antibiotics.

EXAM:
CT CHEST WITHOUT CONTRAST
TECHNIQUE: Multidetector CT imaging of the chest was performed following the
standard protocol without IV contrast.

[Series 2: thorax · axial · 0.59mm/px · z∈[-226,+26]mm · 12 of 148 slices shown, 15 images]
[im 11/148  mediastinal]
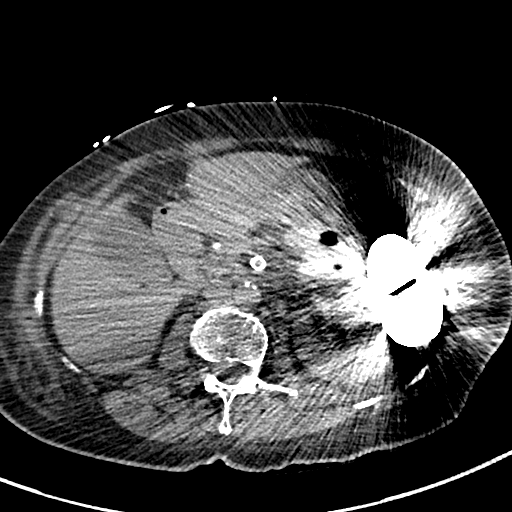
[im 11/148  lung]
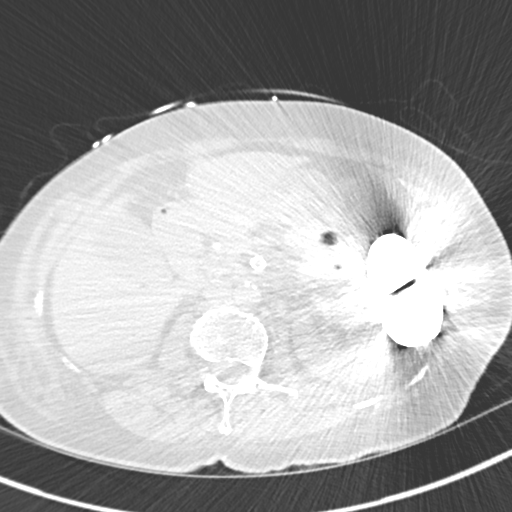
[im 22/148  lung]
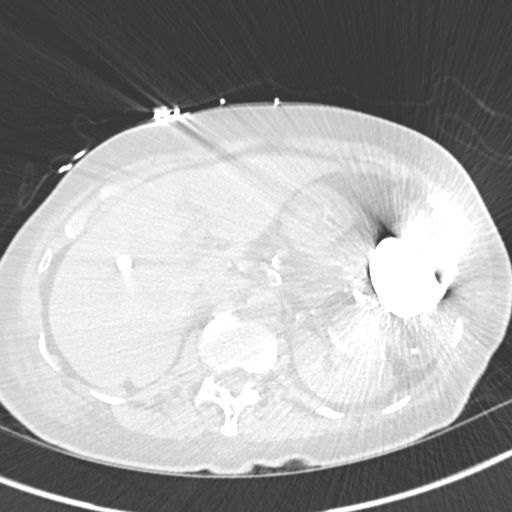
[im 33/148  lung]
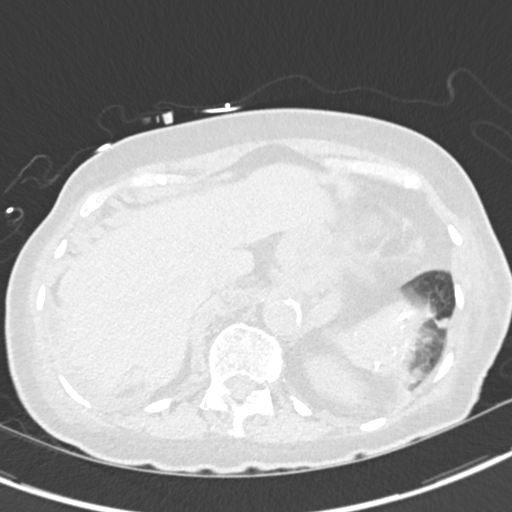
[im 44/148  lung]
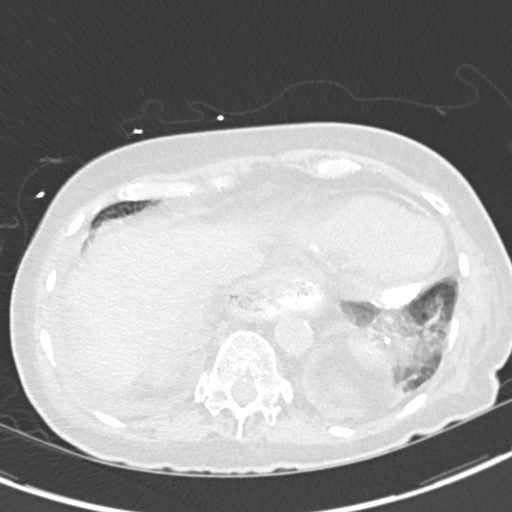
[im 55/148  mediastinal]
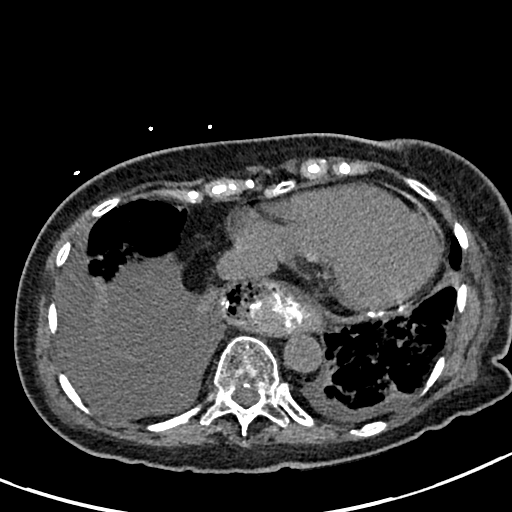
[im 55/148  lung]
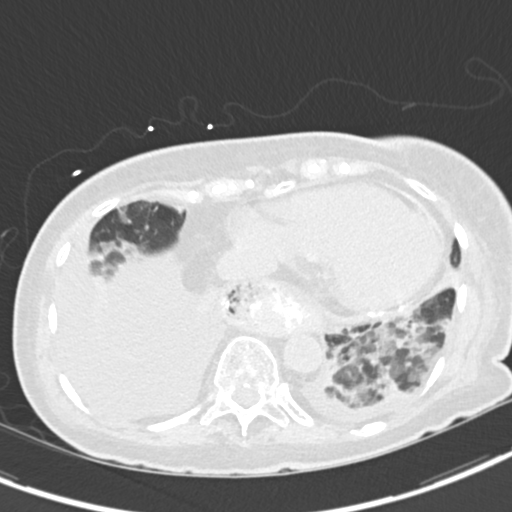
[im 66/148  lung]
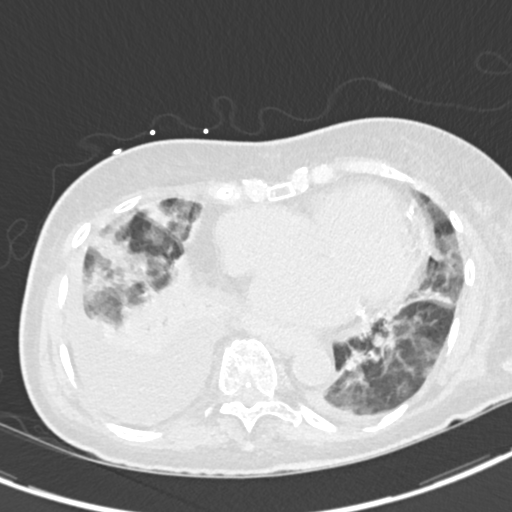
[im 82/148  lung]
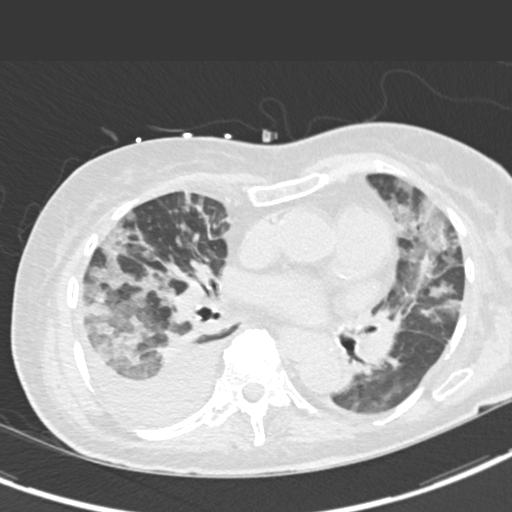
[im 93/148  lung]
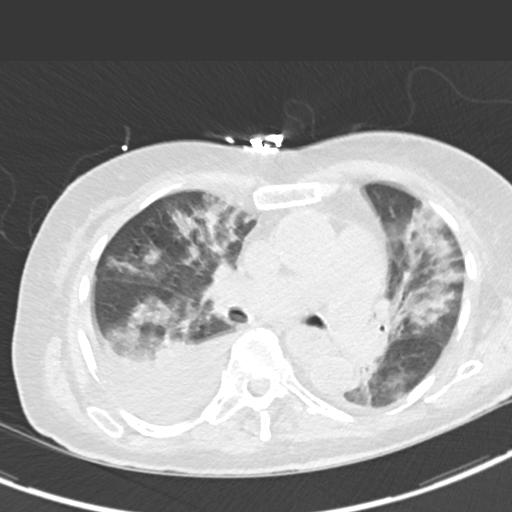
[im 104/148  mediastinal]
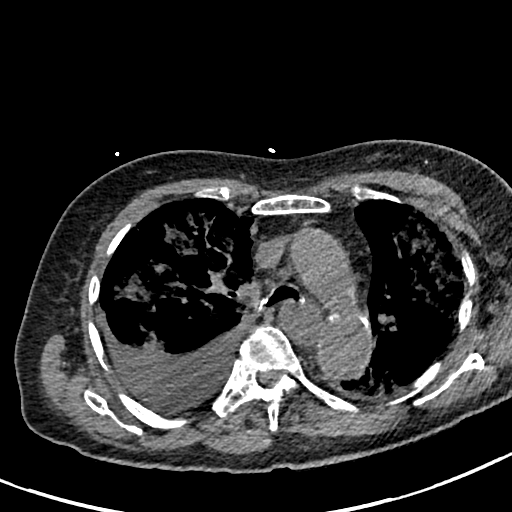
[im 104/148  lung]
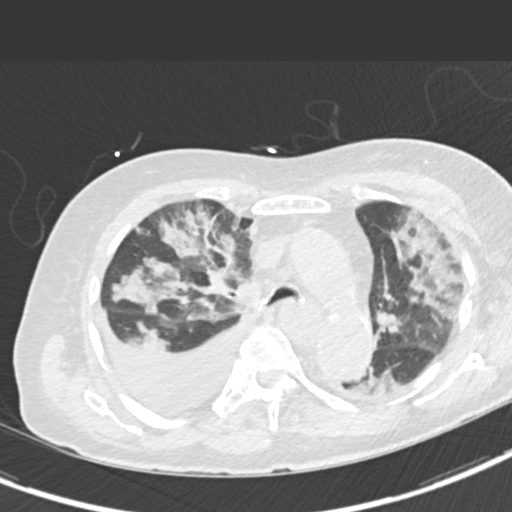
[im 115/148  lung]
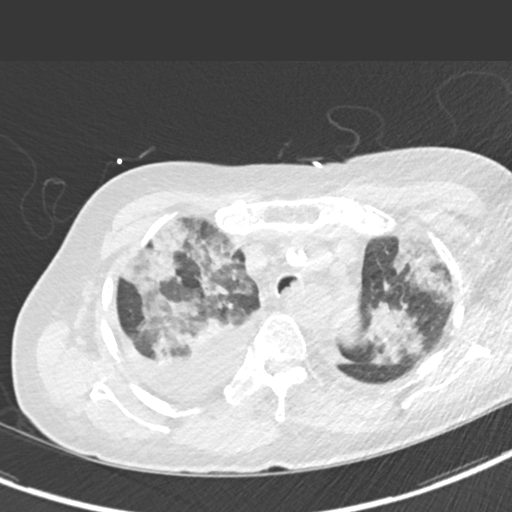
[im 126/148  lung]
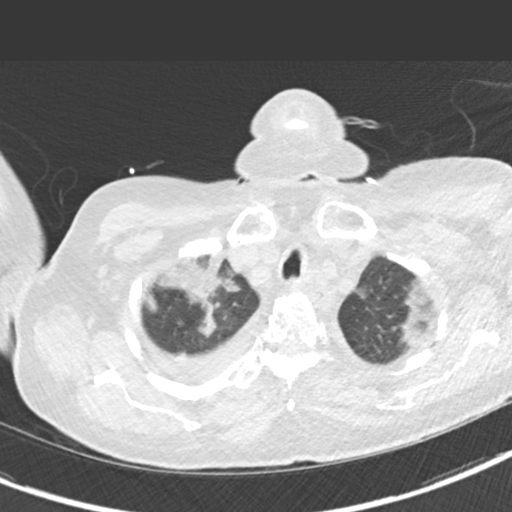
[im 137/148  lung]
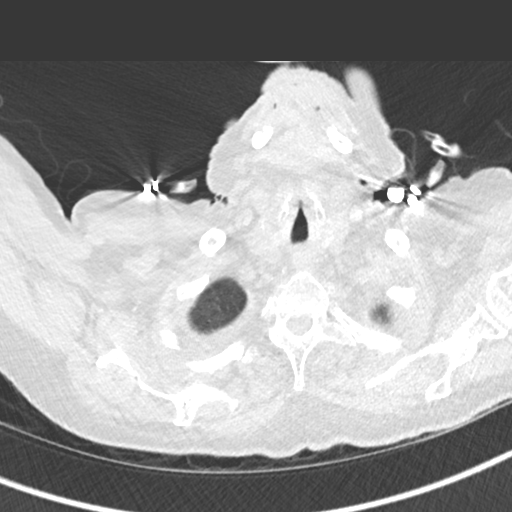

[Series 5: coronal · coronal · 0.59mm/px · 3 of 102 slices shown]
[im 21/102  lung]
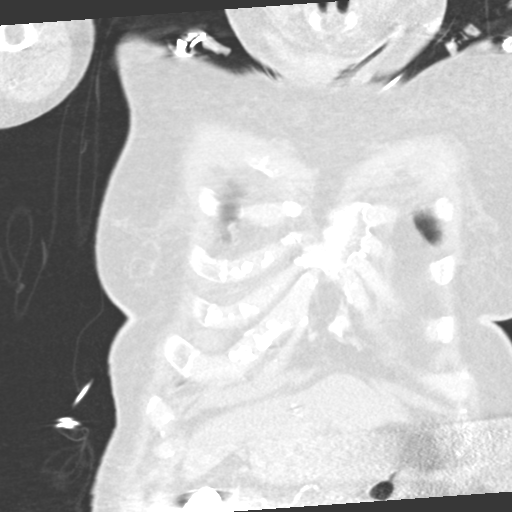
[im 41/102  lung]
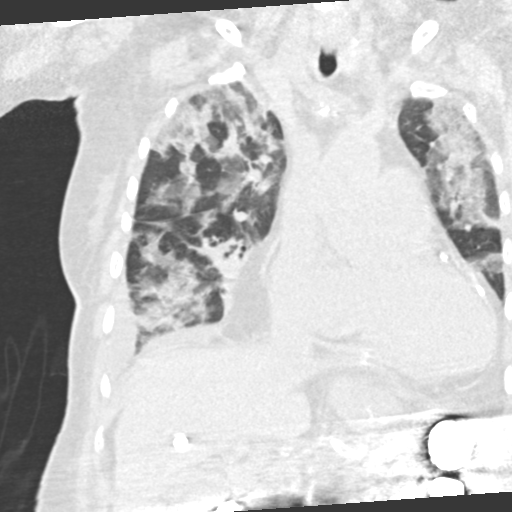
[im 61/102  lung]
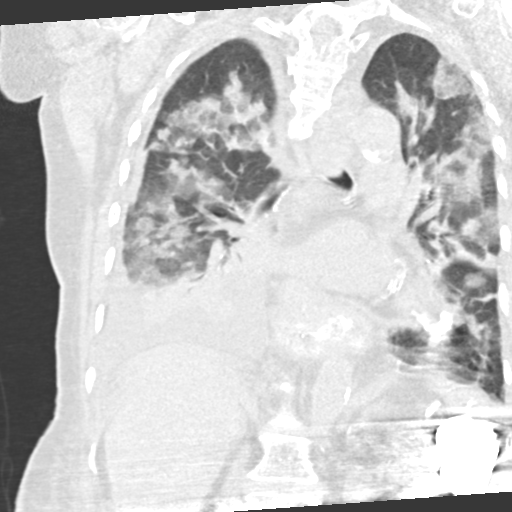

[15 of 36 positions shown; findings below may reference images not displayed]

FINDINGS: Cardiovascular: Chronic mild cardiomegaly. Areas of chronic
pericardial thickening and confluent pericardial calcification with
no pericardial effusion. Calcified coronary artery atherosclerosis
and/or stents. Vascular patency is not evaluated in the absence of
IV contrast.

Mediastinum/Nodes: Mildly fluid distended thoracic esophagus. Small
to moderate size gastric hiatal hernia appears progressed since
[DATE]. Small bilateral mediastinal lymph nodes are within normal
limits. No mediastinal lymphadenopathy.

Lungs/Pleura: Moderate to large layering right pleural effusion.
Trace left pleural fluid. Bilateral scattered confluent pulmonary
opacity is mostly ground-glass type and peribronchial, although
areas of peripheral/subpleural upper lobe involvement are noted. No
definite air bronchograms or consolidation.

Atelectatic appearance of the trachea and central pulmonary airways.
Small volume of retained secretions suspected in the trachea on
series 4, image 33.

Upper Abdomen: Retained barium type contrast in the large bowel
flexures which results in streak artifact through the upper abdomen.
Chronic calcified granulomas in the liver and spleen. Although there
is a paucity of thoracic aortic calcified atherosclerosis there is
moderate to extensive calcified plaque in the upper abdominal aorta
and major mesenteric branches. Possible SMA stent. No upper
abdominal free fluid.

Musculoskeletal: Intermittent congenital incomplete segmentation of
thoracic spinal levels, with a similar appearance noted in the
lumbar spine on the 5969 comparison. Osteopenia. Mild pectus
excavatum. No acute osseous abnormality identified.
IMPRESSION: 1. Moderate to large layering right pleural effusion.
2. Diffuse bilateral confluent but nonspecific pulmonary opacity,
mostly ground-glass type. The appearance more resembles widespread
pulmonary infection or inflammation rather than asymmetric pulmonary
edema.
Consider viral/atypical pneumonia as well as noninfectious
inflammatory processes - such as pulmonary vasculitis.
3. Chronic calcified pericarditis. No pericardial effusion. Mild
cardiomegaly.
4. Mild fluid distension of the thoracic esophagus with moderate
hiatal hernia which has progressed since [DATE]. Calcified coronary artery atherosclerosis and/or stents.
Abdominal Aortic Atherosclerosis ([id number]).

## 2018-09-07 IMAGING — CR DG CHEST 1V PORT
1 series · 1 of 1 positions shown · non-contrast
Comparison: 05/18/2017

CLINICAL DATA: Acute on chronic respiratory failure

EXAM:
PORTABLE CHEST 1 VIEW

[portable]
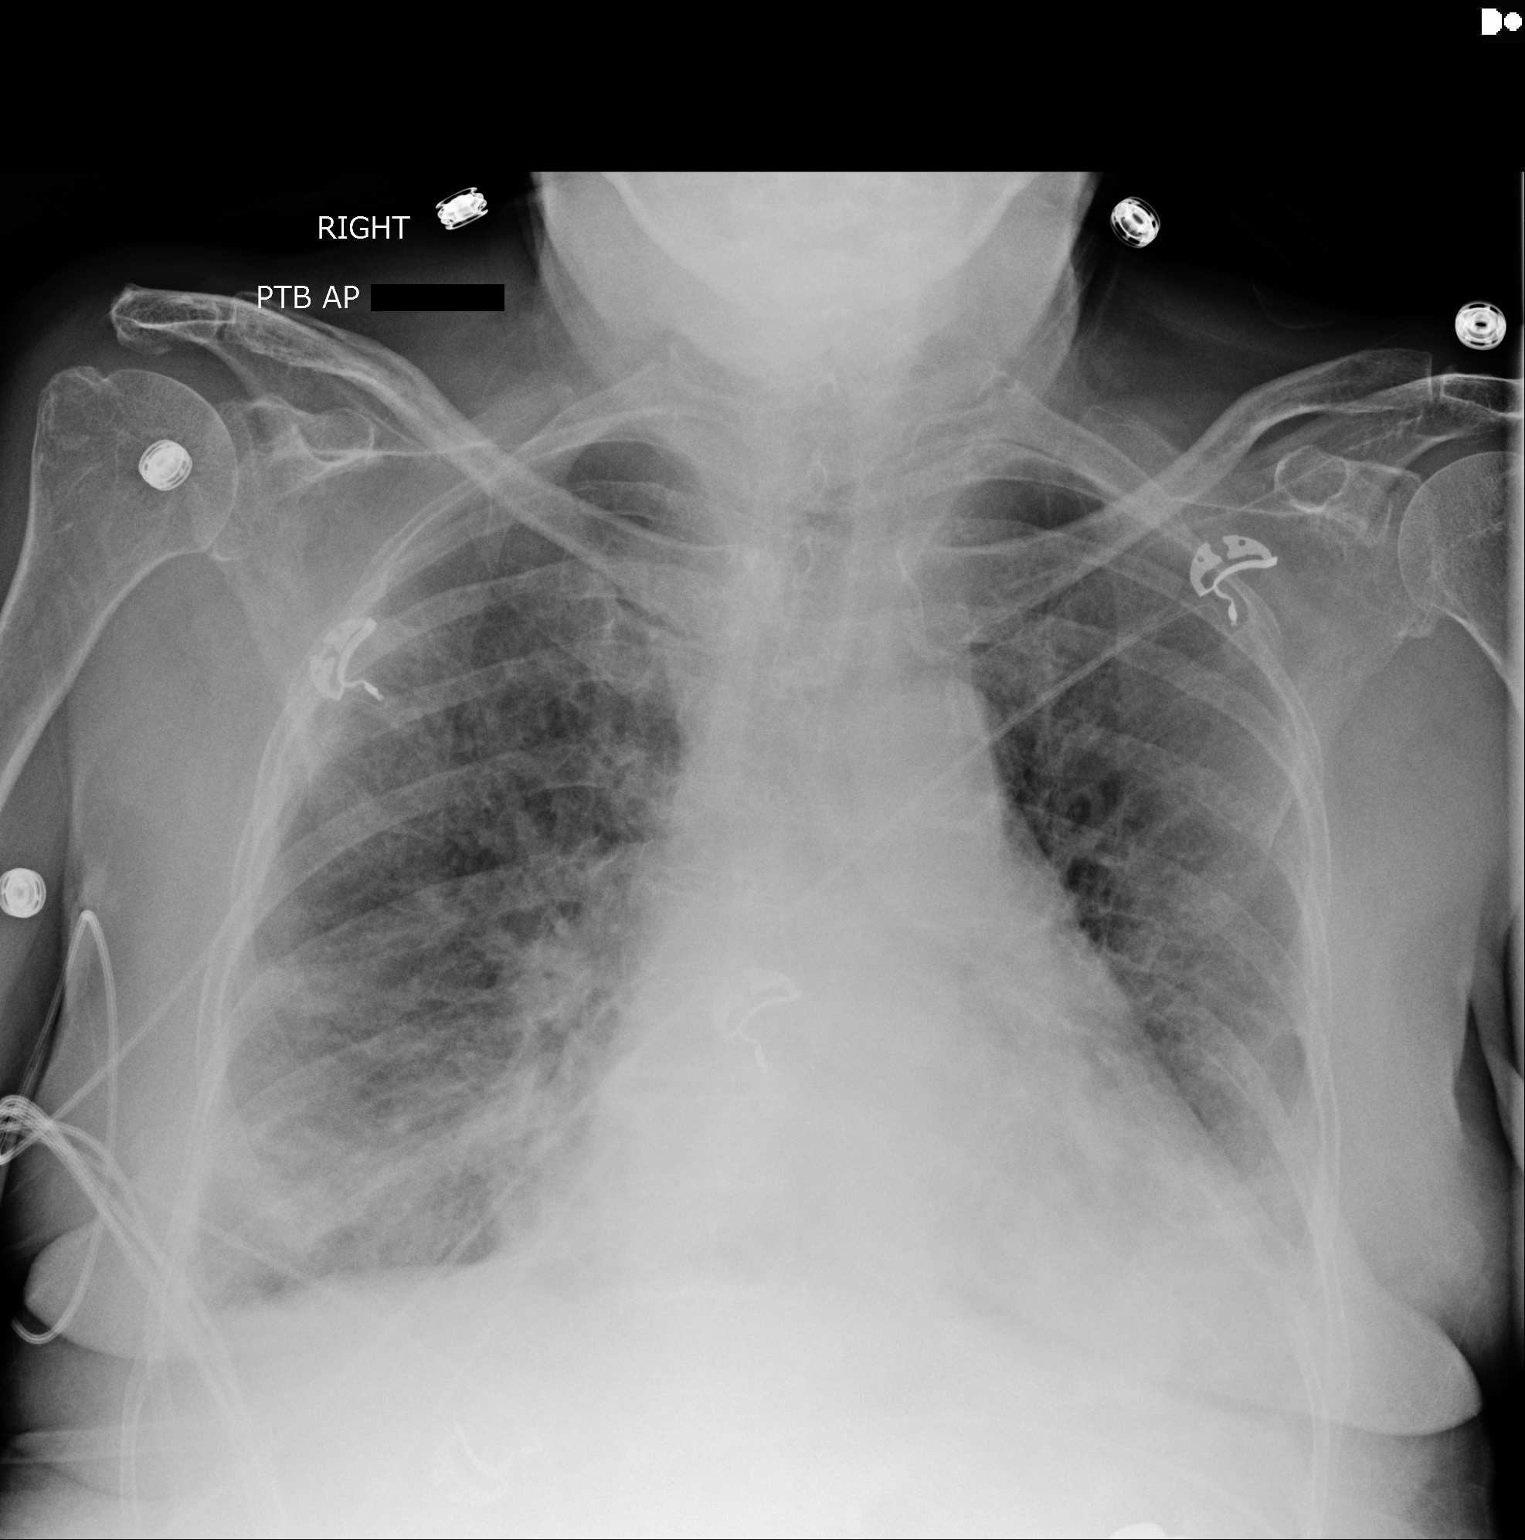

[1 of 1 positions shown; findings below may reference images not displayed]

FINDINGS: Diffuse bilateral airspace disease unchanged. Small bilateral
effusions and bibasilar atelectasis unchanged. Cardiac enlargement.
IMPRESSION: Diffuse bilateral airspace disease and small bilateral effusions
unchanged.
